# Patient Record
Sex: Male | Born: 1935 | Race: White | Hispanic: No | Marital: Married | Smoking: Former smoker
Health system: Southern US, Community
[De-identification: ages and names within clinical notes are randomized; demographics above are authoritative.]

## PROBLEM LIST (undated history)

## (undated) DIAGNOSIS — K219 Gastro-esophageal reflux disease without esophagitis: Secondary | ICD-10-CM

## (undated) DIAGNOSIS — M25511 Pain in right shoulder: Secondary | ICD-10-CM

## (undated) DIAGNOSIS — K838 Other specified diseases of biliary tract: Secondary | ICD-10-CM

## (undated) DIAGNOSIS — R05 Cough: Secondary | ICD-10-CM

## (undated) DIAGNOSIS — R634 Abnormal weight loss: Secondary | ICD-10-CM

## (undated) DIAGNOSIS — I2510 Atherosclerotic heart disease of native coronary artery without angina pectoris: Secondary | ICD-10-CM

## (undated) DIAGNOSIS — D649 Anemia, unspecified: Secondary | ICD-10-CM

## (undated) DIAGNOSIS — T466X5A Adverse effect of antihyperlipidemic and antiarteriosclerotic drugs, initial encounter: Secondary | ICD-10-CM

## (undated) DIAGNOSIS — R079 Chest pain, unspecified: Secondary | ICD-10-CM

## (undated) DIAGNOSIS — G720 Drug-induced myopathy: Secondary | ICD-10-CM

## (undated) DIAGNOSIS — E871 Hypo-osmolality and hyponatremia: Secondary | ICD-10-CM

## (undated) DIAGNOSIS — C801 Malignant (primary) neoplasm, unspecified: Secondary | ICD-10-CM

## (undated) DIAGNOSIS — R351 Nocturia: Secondary | ICD-10-CM

## (undated) DIAGNOSIS — M5431 Sciatica, right side: Secondary | ICD-10-CM

## (undated) DIAGNOSIS — N289 Disorder of kidney and ureter, unspecified: Secondary | ICD-10-CM

## (undated) DIAGNOSIS — Z8739 Personal history of other diseases of the musculoskeletal system and connective tissue: Secondary | ICD-10-CM

## (undated) DIAGNOSIS — R002 Palpitations: Secondary | ICD-10-CM

## (undated) DIAGNOSIS — R160 Hepatomegaly, not elsewhere classified: Secondary | ICD-10-CM

## (undated) DIAGNOSIS — M25512 Pain in left shoulder: Secondary | ICD-10-CM

## (undated) DIAGNOSIS — M1990 Unspecified osteoarthritis, unspecified site: Secondary | ICD-10-CM

## (undated) HISTORY — DX: Nocturia: R35.1

## (undated) HISTORY — DX: Pain in right shoulder: M25.511

## (undated) HISTORY — DX: Other disorders of bilirubin metabolism: E80.6

## (undated) HISTORY — DX: Pain in left shoulder: M25.512

## (undated) HISTORY — DX: Chest pain, unspecified: R07.9

## (undated) HISTORY — DX: Drug-induced myopathy: G72.0

## (undated) HISTORY — DX: Anemia, unspecified: D64.9

## (undated) HISTORY — DX: Disorder of kidney and ureter, unspecified: N28.9

## (undated) HISTORY — DX: Cough: R05

## (undated) HISTORY — DX: Abnormal weight loss: R63.4

## (undated) HISTORY — DX: Adverse effect of antihyperlipidemic and antiarteriosclerotic drugs, initial encounter: T46.6X5A

## (undated) HISTORY — DX: Personal history of other diseases of the musculoskeletal system and connective tissue: Z87.39

## (undated) HISTORY — DX: Unspecified osteoarthritis, unspecified site: M19.90

## (undated) HISTORY — DX: Palpitations: R00.2

## (undated) HISTORY — DX: Atherosclerotic heart disease of native coronary artery without angina pectoris: I25.10

## (undated) HISTORY — DX: Other specified diseases of biliary tract: K83.8

## (undated) HISTORY — DX: Hepatomegaly, not elsewhere classified: R16.0

## (undated) HISTORY — DX: Hypo-osmolality and hyponatremia: E87.1

## (undated) HISTORY — DX: Sciatica, right side: M54.31

---

## 1969-07-29 HISTORY — PX: OTHER SURGICAL HISTORY: SHX169

## 1977-11-28 HISTORY — PX: KNEE SURGERY: SHX244

## 1991-11-29 HISTORY — PX: PROSTATE SURGERY: SHX751

## 2001-09-14 ENCOUNTER — Ambulatory Visit (HOSPITAL_COMMUNITY): Admission: RE | Admit: 2001-09-14 | Discharge: 2001-09-14 | Payer: Self-pay | Admitting: Cardiology

## 2008-08-14 ENCOUNTER — Encounter: Admission: RE | Admit: 2008-08-14 | Discharge: 2008-08-14 | Payer: Self-pay | Admitting: Cardiology

## 2010-07-30 ENCOUNTER — Ambulatory Visit: Payer: Self-pay | Admitting: Cardiology

## 2010-08-06 ENCOUNTER — Encounter: Admission: RE | Admit: 2010-08-06 | Discharge: 2010-08-06 | Payer: Self-pay | Admitting: Cardiology

## 2010-08-06 ENCOUNTER — Ambulatory Visit: Payer: Self-pay | Admitting: Cardiology

## 2010-10-14 ENCOUNTER — Encounter (HOSPITAL_COMMUNITY)
Admission: RE | Admit: 2010-10-14 | Discharge: 2010-12-28 | Payer: Self-pay | Source: Home / Self Care | Attending: Nephrology | Admitting: Nephrology

## 2010-10-15 ENCOUNTER — Ambulatory Visit: Payer: Self-pay | Admitting: Cardiology

## 2010-12-03 LAB — POCT HEMOGLOBIN-HEMACUE: Hemoglobin: 10.2 g/dL — ABNORMAL LOW (ref 13.0–17.0)

## 2010-12-07 ENCOUNTER — Ambulatory Visit: Payer: Self-pay | Admitting: Cardiology

## 2010-12-13 LAB — IRON AND TIBC
Iron: 116 ug/dL (ref 42–135)
Saturation Ratios: 37 % (ref 20–55)
TIBC: 315 ug/dL (ref 215–435)
UIBC: 199 ug/dL

## 2010-12-13 LAB — POCT HEMOGLOBIN-HEMACUE: Hemoglobin: 11.1 g/dL — ABNORMAL LOW (ref 13.0–17.0)

## 2010-12-13 LAB — FERRITIN: Ferritin: 14 ng/mL — ABNORMAL LOW (ref 22–322)

## 2010-12-21 LAB — POCT HEMOGLOBIN-HEMACUE: Hemoglobin: 12.4 g/dL — ABNORMAL LOW (ref 13.0–17.0)

## 2010-12-31 ENCOUNTER — Other Ambulatory Visit: Payer: Self-pay | Admitting: Nephrology

## 2010-12-31 ENCOUNTER — Other Ambulatory Visit: Payer: Self-pay

## 2010-12-31 ENCOUNTER — Encounter (HOSPITAL_COMMUNITY): Payer: Medicare Other | Attending: Nephrology

## 2010-12-31 DIAGNOSIS — D638 Anemia in other chronic diseases classified elsewhere: Secondary | ICD-10-CM | POA: Insufficient documentation

## 2010-12-31 DIAGNOSIS — N183 Chronic kidney disease, stage 3 unspecified: Secondary | ICD-10-CM | POA: Insufficient documentation

## 2010-12-31 LAB — FERRITIN: Ferritin: 29 ng/mL (ref 22–322)

## 2010-12-31 LAB — IRON AND TIBC
Iron: 93 ug/dL (ref 42–135)
Saturation Ratios: 30 % (ref 20–55)
TIBC: 315 ug/dL (ref 215–435)
UIBC: 222 ug/dL

## 2011-01-03 LAB — POCT HEMOGLOBIN-HEMACUE: Hemoglobin: 11.8 g/dL — ABNORMAL LOW (ref 13.0–17.0)

## 2011-01-14 ENCOUNTER — Other Ambulatory Visit: Payer: Self-pay

## 2011-01-14 ENCOUNTER — Encounter (HOSPITAL_COMMUNITY): Payer: Medicare Other

## 2011-01-14 LAB — POCT HEMOGLOBIN-HEMACUE: Hemoglobin: 12.2 g/dL — ABNORMAL LOW (ref 13.0–17.0)

## 2011-01-28 ENCOUNTER — Encounter (HOSPITAL_COMMUNITY): Payer: 59

## 2011-02-04 ENCOUNTER — Encounter (HOSPITAL_COMMUNITY): Payer: Medicare Other | Attending: Nephrology

## 2011-02-04 ENCOUNTER — Other Ambulatory Visit: Payer: Self-pay

## 2011-02-04 ENCOUNTER — Other Ambulatory Visit: Payer: Self-pay | Admitting: Internal Medicine

## 2011-02-04 DIAGNOSIS — D638 Anemia in other chronic diseases classified elsewhere: Secondary | ICD-10-CM | POA: Insufficient documentation

## 2011-02-04 DIAGNOSIS — N183 Chronic kidney disease, stage 3 unspecified: Secondary | ICD-10-CM | POA: Insufficient documentation

## 2011-02-04 LAB — IRON AND TIBC
Iron: 77 ug/dL (ref 42–135)
Saturation Ratios: 28 % (ref 20–55)
TIBC: 271 ug/dL (ref 215–435)
UIBC: 194 ug/dL

## 2011-02-04 LAB — FERRITIN: Ferritin: 53 ng/mL (ref 22–322)

## 2011-02-04 LAB — POCT HEMOGLOBIN-HEMACUE: Hemoglobin: 10.6 g/dL — ABNORMAL LOW (ref 13.0–17.0)

## 2011-02-07 LAB — FERRITIN: Ferritin: 23 ng/mL (ref 22–322)

## 2011-02-07 LAB — POCT HEMOGLOBIN-HEMACUE
Hemoglobin: 9.6 g/dL — ABNORMAL LOW (ref 13.0–17.0)
Hemoglobin: 9.7 g/dL — ABNORMAL LOW (ref 13.0–17.0)
Hemoglobin: 9.4 g/dL — ABNORMAL LOW (ref 13.0–17.0)

## 2011-02-08 LAB — POCT HEMOGLOBIN-HEMACUE: Hemoglobin: 8.8 g/dL — ABNORMAL LOW (ref 13.0–17.0)

## 2011-02-25 ENCOUNTER — Other Ambulatory Visit: Payer: Self-pay | Admitting: Nephrology

## 2011-02-25 ENCOUNTER — Encounter (HOSPITAL_COMMUNITY): Payer: Medicare Other

## 2011-02-25 LAB — IRON AND TIBC
Iron: 90 ug/dL (ref 42–135)
Saturation Ratios: 32 % (ref 20–55)
TIBC: 282 ug/dL (ref 215–435)
UIBC: 192 ug/dL

## 2011-02-25 LAB — FERRITIN: Ferritin: 43 ng/mL (ref 22–322)

## 2011-02-28 LAB — POCT HEMOGLOBIN-HEMACUE: Hemoglobin: 10.6 g/dL — ABNORMAL LOW (ref 13.0–17.0)

## 2011-03-18 ENCOUNTER — Other Ambulatory Visit: Payer: Self-pay | Admitting: Nephrology

## 2011-03-18 ENCOUNTER — Encounter (HOSPITAL_COMMUNITY): Payer: Medicare Other | Attending: Nephrology

## 2011-03-18 DIAGNOSIS — D638 Anemia in other chronic diseases classified elsewhere: Secondary | ICD-10-CM | POA: Insufficient documentation

## 2011-03-18 DIAGNOSIS — N183 Chronic kidney disease, stage 3 unspecified: Secondary | ICD-10-CM | POA: Insufficient documentation

## 2011-03-18 LAB — POCT HEMOGLOBIN-HEMACUE: Hemoglobin: 9.8 g/dL — ABNORMAL LOW (ref 13.0–17.0)

## 2011-04-06 ENCOUNTER — Other Ambulatory Visit: Payer: Self-pay | Admitting: *Deleted

## 2011-04-06 DIAGNOSIS — E785 Hyperlipidemia, unspecified: Secondary | ICD-10-CM

## 2011-04-06 MED ORDER — ATORVASTATIN CALCIUM 10 MG PO TABS: 10.0000 mg | ORAL_TABLET | Freq: Every day | ORAL | Status: DC

## 2011-04-06 NOTE — Telephone Encounter (Signed)
Refilled lipitor re-fax [facility]

## 2011-04-06 NOTE — Telephone Encounter (Signed)
Refilled meds per fax request.  

## 2011-04-08 ENCOUNTER — Other Ambulatory Visit: Payer: Self-pay | Admitting: Nephrology

## 2011-04-08 ENCOUNTER — Encounter (HOSPITAL_COMMUNITY): Payer: Medicare Other | Attending: Nephrology

## 2011-04-08 DIAGNOSIS — D638 Anemia in other chronic diseases classified elsewhere: Secondary | ICD-10-CM | POA: Insufficient documentation

## 2011-04-08 DIAGNOSIS — N183 Chronic kidney disease, stage 3 unspecified: Secondary | ICD-10-CM | POA: Insufficient documentation

## 2011-04-08 LAB — IRON AND TIBC
Iron: 112 ug/dL (ref 42–135)
Saturation Ratios: 36 % (ref 20–55)
TIBC: 309 ug/dL (ref 215–435)
UIBC: 197 ug/dL

## 2011-04-08 LAB — FERRITIN: Ferritin: 54 ng/mL (ref 22–322)

## 2011-04-11 LAB — POCT HEMOGLOBIN-HEMACUE: Hemoglobin: 10.7 g/dL — ABNORMAL LOW (ref 13.0–17.0)

## 2011-04-15 NOTE — Discharge Summary (Signed)
[facility]  Patient:    Gerald Jacobs, Gerald Jacobs Visit Number: [id number] MRN: [id number]          Service Type: Attending:  Kerry Hough., M.D. Dictated by:   Kerry Hough., M.D. Proc. Date: 09/13/01 Adm. Date:  09/14/01   CC:         Marcello Moores A. Mare Ferrari, M.D.   Discharge Summary  INDICATIONS: The patient is a 77 year old male with diabetes, atypical chest pain and an abnormal Cardiolite scan.  COMMENTS ABOUT PROCEDURE: The patient tolerated the procedure well without complications and had good hemostasis and pedal pulses following the procedure. Please see the attached catheterization report for remainder of the details.  HEMODYNAMIC DATA: Aorta post contrast 113/50, LV post contrast 113/9.  ANGIOGRAPHIC DATA:  LEFT VENTRICULOGRAM: The left ventriculogram performed in the 30 degree RAO projection.  The aortic valve appears normal. The mitral valve is normal. The left ventricle is normal in size. There is normal wall motion. The estimated ejection fraction was 60-65%.  Coronary arteries arise and distribute normally. Scattered calcification is seen involving the proximal left coronary system. There is also calcification seen in the aortic knob on fluoroscopy.  The left main coronary artery appears normal.  The left anterior descending has mild calcification proximally and a mild eccentric 40% proximal stenosis. The distal vessel tapers and is of small caliber.  The circumflex coronary artery has a large first marginal branch and a large posterolateral branch.  The right coronary artery is a somewhat small vessel ending in a posterior descending artery and is free of disease. The posterior descending artery is somewhat small caliber.  IMPRESSION: 1. Mild coronary artery disease predominately involving the proximal left    anterior descending. 2. Normal left ventricular function. Dictated by:   Kerry Hough.,  M.D. Attending:  Kerry Hough., M.D. DD:  09/14/01 TD:  09/14/01 Job: [id number]

## 2011-04-15 NOTE — H&P (Signed)
[facility]  Patient:    Gerald Jacobs, Gerald Jacobs Visit Number: [id number] MRN: [id number]          Service Type: Attending:  Kerry Hough., M.D. Dictated by:   Kerry Hough., M.D. Adm. Date:  09/14/01   CC:         Marcello Moores A. Mare Ferrari, M.D.                         History and Physical  REASON FOR ADMISSION:  Catheterization.  HISTORY OF PRESENT ILLNESS:  The patient is a 77 year old male who has a prior history of hypertension and diabetes.  He had three episodes of chest discomfort recently which were not exertional.  He attributed them to eating peanuts on three occasions.  He had been feeling weak with no energy and was not pleuritic.  He had one episode which occurred while playing cough and was attributed to eating peanut.  It did not radiate to the left arm and was not associate with diaphoresis.  Thomas A. Mare Ferrari, M.D., did a stress Cardiolite study on him on September 03, 2001.  He only walked six minutes into the Bruce protocol.  He had some mild transient ischemic dilation and inferior ischemia noted.  Catheterization was advised.  PAST MEDICAL HISTORY:  Remarkable for labile hypertension for many years and he does take medicines for blood pressure.  He has been a diabetic for several years, which has been controlled with oral agents.  Hemoglobin A1Cs run in the high 6s.  He does not have significant hyperlipidemia.  No ulcers.  PAST SURGICAL HISTORY:  Removal of melena years ago.  Right knee surgery in 1979.  Radical prostatectomy in March of 1993 for prostate cancer.  ALLERGIES:  ACE INHIBITORS cause cough.  CURRENT MEDICATIONS: 1. Inderal 10 mg daily. 2. Aspirin 325 mg daily. 3. Toprol XL 50 mg daily. 4. Hyzaar 50/12.5 mg daily. 5. Glucophage 500 mg daily. 6. Actos 30 mg daily. 7. Clarinex daily.  FAMILY HISTORY:  His father died at age 63 of stroke.  His mother died at age 52 of stroke and heart disease.  Four  brothers are living.  A sister is living.  Two brothers died, one of cancer.  Two sisters have died.  SOCIAL HISTORY:  He is retired for [employer].  He has been married for close to 50 years.  He attends [employer].  He drinks rare alcohol socially.  He quit smoking in 1984.  REVIEW OF SYSTEMS:  He has had some migratory pruritus previous.  He wears glasses.  He has no eyes, ear, nose, or throat problems of significance.  He tends to get chokes if he lies flat.  He has some erectile dysfunction related to his previous surgery.  He has significant urinary incontinence.  He has no diabetic eye problems, no neuropathy, and no claudication.  No TIAs other than as noted above.  The remainder of his review of systems was normal.  PHYSICAL EXAMINATION:  He is a pleasant male who is in no acute distress.  WEIGHT:  154 pounds.  VITAL SIGNS:  His blood pressure is 150/70 sitting and 150/74 standing.  His pulse was 70 and regular.  SKIN:  Warm and dry.  HEENT:  EOMI.  PERRLA.  CNS clear.  Fundi were normal.  The pharynx was negative.  NECK:  Supple without masses.  No JVD, thyromegaly, or carotid bruits.  Lymph nodes were  normal.  LUNGS:  Clear to A&P.  CARDIAC:  Normal S1 and S2.  No S3, S4, or murmur.  ABDOMEN:  Soft and nontender.  There is no mass, no hepatosplenomegaly, and no aneurysm.  EXTREMITIES:  Femoral pulses were 2+ and peripheral were present and 2+. There was no peripheral edema.  NEUROLOGIC:  Normal.  GENITOURINARY:  Deferred due to cardiac condition.  RECTAL:  Deferred due to cardiac condition.  IMPRESSION: 1. Chest discomfort with atypical feature with an abnormal Cardiolite scan.    Rule out coronary artery disease. 2. Type 2 diabetes mellitus. 3. Hypertension.  Not at target control for a diabetic. 4. History of prostate cancer, status post radical prostatectomy.  RECOMMENDATIONS:  Brought in for same-day cardiac catheterization and  possible angioplasty and stent.  The procedure was discussed with the patient fully, including the risks of MI, death, or CVA.  He is agreeable and willing to proceed. Dictated by:   Kerry Hough., M.D. Attending:  Kerry Hough., M.D. DD:  09/12/01 TD:  09/12/01 Job: [id number]

## 2011-04-29 ENCOUNTER — Encounter (HOSPITAL_COMMUNITY): Payer: Medicare Other | Attending: Nephrology

## 2011-04-29 ENCOUNTER — Other Ambulatory Visit: Payer: Self-pay | Admitting: Nephrology

## 2011-04-29 DIAGNOSIS — D638 Anemia in other chronic diseases classified elsewhere: Secondary | ICD-10-CM | POA: Insufficient documentation

## 2011-04-29 DIAGNOSIS — N183 Chronic kidney disease, stage 3 unspecified: Secondary | ICD-10-CM | POA: Insufficient documentation

## 2011-04-29 LAB — RENAL FUNCTION PANEL
Albumin: 3.6 g/dL (ref 3.5–5.2)
BUN: 39 mg/dL — ABNORMAL HIGH (ref 6–23)
CO2: 24 mEq/L (ref 19–32)
Calcium: 8.5 mg/dL (ref 8.4–10.5)
Chloride: 105 mEq/L (ref 96–112)
Creatinine, Ser: 2.42 mg/dL — ABNORMAL HIGH (ref 0.4–1.5)
GFR calc Af Amer: 31 mL/min — ABNORMAL LOW (ref >60)
GFR calc non Af Amer: 26 mL/min — ABNORMAL LOW (ref >60)
Glucose, Bld: 143 mg/dL — ABNORMAL HIGH (ref 70–99)
Phosphorus: 3.3 mg/dL (ref 2.3–4.6)
Potassium: 4.6 mEq/L (ref 3.5–5.1)
Sodium: 138 mEq/L (ref 135–145)

## 2011-04-29 LAB — MAGNESIUM: Magnesium: 2.4 mg/dL (ref 1.5–2.5)

## 2011-04-29 LAB — VITAMIN D 25 HYDROXY (VIT D DEFICIENCY, FRACTURES): Vit D, 25-Hydroxy: 31 ng/mL (ref 30–89)

## 2011-05-02 LAB — POCT HEMOGLOBIN-HEMACUE: Hemoglobin: 10.6 g/dL — ABNORMAL LOW (ref 13.0–17.0)

## 2011-05-23 ENCOUNTER — Other Ambulatory Visit: Payer: Self-pay | Admitting: Nephrology

## 2011-05-23 ENCOUNTER — Encounter (HOSPITAL_COMMUNITY): Payer: Medicare Other

## 2011-05-23 LAB — FERRITIN: Ferritin: 64 ng/mL (ref 22–322)

## 2011-06-13 ENCOUNTER — Encounter (HOSPITAL_COMMUNITY)
Admission: RE | Admit: 2011-06-13 | Discharge: 2011-06-13 | Disposition: A | Discharge status: Home / Self Care | Payer: Medicare Other | Source: Ambulatory Visit | Attending: Nephrology | Admitting: Nephrology

## 2011-06-13 ENCOUNTER — Other Ambulatory Visit: Payer: Self-pay | Admitting: Nephrology

## 2011-06-13 DIAGNOSIS — D638 Anemia in other chronic diseases classified elsewhere: Secondary | ICD-10-CM | POA: Insufficient documentation

## 2011-06-13 DIAGNOSIS — N183 Chronic kidney disease, stage 3 unspecified: Secondary | ICD-10-CM | POA: Insufficient documentation

## 2011-06-13 LAB — POCT HEMOGLOBIN-HEMACUE: Hemoglobin: 10.5 g/dL — ABNORMAL LOW (ref 13.0–17.0)

## 2011-07-04 ENCOUNTER — Other Ambulatory Visit: Payer: Self-pay | Admitting: Nephrology

## 2011-07-04 ENCOUNTER — Encounter (HOSPITAL_COMMUNITY): Payer: Medicare Other | Attending: Nephrology

## 2011-07-04 DIAGNOSIS — N183 Chronic kidney disease, stage 3 unspecified: Secondary | ICD-10-CM | POA: Insufficient documentation

## 2011-07-04 DIAGNOSIS — D638 Anemia in other chronic diseases classified elsewhere: Secondary | ICD-10-CM | POA: Insufficient documentation

## 2011-07-04 LAB — IRON AND TIBC
Saturation Ratios: 34 % (ref 20–55)
TIBC: 322 ug/dL (ref 215–435)

## 2011-07-04 LAB — POCT HEMOGLOBIN-HEMACUE: Hemoglobin: 11.3 g/dL — ABNORMAL LOW (ref 13.0–17.0)

## 2011-07-04 LAB — FERRITIN: Ferritin: 48 ng/mL (ref 22–322)

## 2011-07-25 ENCOUNTER — Encounter (HOSPITAL_COMMUNITY): Payer: 59

## 2011-07-26 ENCOUNTER — Other Ambulatory Visit: Payer: Self-pay | Admitting: Nephrology

## 2011-07-26 ENCOUNTER — Encounter (HOSPITAL_COMMUNITY)
Admission: RE | Admit: 2011-07-26 | Discharge: 2011-07-26 | Payer: Medicare Other | Source: Ambulatory Visit | Attending: Nephrology | Admitting: Nephrology

## 2011-08-16 ENCOUNTER — Other Ambulatory Visit: Payer: Self-pay | Admitting: Nephrology

## 2011-08-16 ENCOUNTER — Encounter (HOSPITAL_COMMUNITY): Payer: Medicare Other | Attending: Nephrology

## 2011-08-16 DIAGNOSIS — D638 Anemia in other chronic diseases classified elsewhere: Secondary | ICD-10-CM | POA: Insufficient documentation

## 2011-08-16 DIAGNOSIS — N183 Chronic kidney disease, stage 3 unspecified: Secondary | ICD-10-CM | POA: Insufficient documentation

## 2011-08-17 LAB — POCT HEMOGLOBIN-HEMACUE: Hemoglobin: 10.6 g/dL — ABNORMAL LOW (ref 13.0–17.0)

## 2011-08-23 ENCOUNTER — Other Ambulatory Visit: Payer: Self-pay | Admitting: Cardiology

## 2011-08-23 ENCOUNTER — Other Ambulatory Visit: Payer: Self-pay | Admitting: *Deleted

## 2011-08-31 ENCOUNTER — Other Ambulatory Visit: Payer: Medicare Other | Admitting: *Deleted

## 2011-09-02 ENCOUNTER — Ambulatory Visit (INDEPENDENT_AMBULATORY_CARE_PROVIDER_SITE_OTHER): Payer: Medicare Other | Admitting: *Deleted

## 2011-09-02 DIAGNOSIS — E7800 Pure hypercholesterolemia, unspecified: Secondary | ICD-10-CM

## 2011-09-02 LAB — BASIC METABOLIC PANEL
CO2: 22 mEq/L (ref 19–32)
Chloride: 108 mEq/L (ref 96–112)
Glucose, Bld: 145 mg/dL — ABNORMAL HIGH (ref 70–99)
Potassium: 4.3 mEq/L (ref 3.5–5.1)
Sodium: 138 mEq/L (ref 135–145)

## 2011-09-02 LAB — CBC WITH DIFFERENTIAL/PLATELET
Basophils Absolute: 0 10*3/uL (ref 0.0–0.1)
Basophils Relative: 0.3 % (ref 0.0–3.0)
Eosinophils Absolute: 0.4 10*3/uL (ref 0.0–0.7)
Lymphocytes Relative: 30.3 % (ref 12.0–46.0)
MCHC: 32.7 g/dL (ref 30.0–36.0)
MCV: 93 fl (ref 78.0–100.0)
Monocytes Absolute: 0.5 10*3/uL (ref 0.1–1.0)
Neutrophils Relative %: 57 % (ref 43.0–77.0)
RBC: 3.44 Mil/uL — ABNORMAL LOW (ref 4.22–5.81)
RDW: 14.6 % (ref 11.5–14.6)

## 2011-09-02 LAB — LIPID PANEL
Cholesterol: 107 mg/dL (ref 0–200)
HDL: 43.1 mg/dL (ref >39.00)
LDL Cholesterol: 37 mg/dL (ref 0–99)
Triglycerides: 133 mg/dL (ref 0.0–149.0)
VLDL: 26.6 mg/dL (ref 0.0–40.0)

## 2011-09-02 LAB — HEPATIC FUNCTION PANEL
AST: 19 U/L (ref 0–37)
Albumin: 3.6 g/dL (ref 3.5–5.2)
Total Bilirubin: 0.5 mg/dL (ref 0.3–1.2)

## 2011-09-06 ENCOUNTER — Other Ambulatory Visit: Payer: Self-pay | Admitting: Nephrology

## 2011-09-06 ENCOUNTER — Encounter (HOSPITAL_COMMUNITY)
Admission: RE | Admit: 2011-09-06 | Discharge: 2011-09-06 | Disposition: A | Discharge status: Home / Self Care | Payer: Medicare Other | Source: Ambulatory Visit | Attending: Nephrology | Admitting: Nephrology

## 2011-09-06 DIAGNOSIS — D638 Anemia in other chronic diseases classified elsewhere: Secondary | ICD-10-CM | POA: Insufficient documentation

## 2011-09-06 DIAGNOSIS — N183 Chronic kidney disease, stage 3 unspecified: Secondary | ICD-10-CM | POA: Insufficient documentation

## 2011-09-06 LAB — POCT HEMOGLOBIN-HEMACUE: Hemoglobin: 10.8 g/dL — ABNORMAL LOW (ref 13.0–17.0)

## 2011-09-06 LAB — IRON AND TIBC
Saturation Ratios: 28 % (ref 20–55)
TIBC: 306 ug/dL (ref 215–435)
UIBC: 220 ug/dL (ref 125–400)

## 2011-09-06 LAB — FERRITIN: Ferritin: 42 ng/mL (ref 22–322)

## 2011-09-07 ENCOUNTER — Ambulatory Visit (INDEPENDENT_AMBULATORY_CARE_PROVIDER_SITE_OTHER): Payer: Medicare Other | Admitting: Cardiology

## 2011-09-07 ENCOUNTER — Encounter: Payer: Self-pay | Admitting: Cardiology

## 2011-09-07 VITALS — BP 143/68 | HR 63 | Ht 68.0 in | Wt 153.0 lb

## 2011-09-07 DIAGNOSIS — E785 Hyperlipidemia, unspecified: Secondary | ICD-10-CM

## 2011-09-07 DIAGNOSIS — E119 Type 2 diabetes mellitus without complications: Secondary | ICD-10-CM

## 2011-09-07 DIAGNOSIS — N184 Chronic kidney disease, stage 4 (severe): Secondary | ICD-10-CM | POA: Insufficient documentation

## 2011-09-07 DIAGNOSIS — E7800 Pure hypercholesterolemia, unspecified: Secondary | ICD-10-CM

## 2011-09-07 DIAGNOSIS — I119 Hypertensive heart disease without heart failure: Secondary | ICD-10-CM

## 2011-09-07 DIAGNOSIS — E1122 Type 2 diabetes mellitus with diabetic chronic kidney disease: Secondary | ICD-10-CM | POA: Insufficient documentation

## 2011-09-07 DIAGNOSIS — N183 Chronic kidney disease, stage 3 unspecified: Secondary | ICD-10-CM

## 2011-09-07 MED ORDER — MIRTAZAPINE 7.5 MG PO TABS: 7.5000 mg | ORAL_TABLET | Freq: Every day | ORAL | Status: DC

## 2011-09-07 NOTE — Assessment & Plan Note (Signed)
The patient was taken off his metformin because of his renal insufficiency by nephrology.  In its place.  He is on Januvia 50 mg daily.  Blood sugars are still running a little high.  We'll continue to watch his diet carefully and continue same meds

## 2011-09-07 NOTE — Patient Instructions (Signed)
Your physician wants you to follow-up in: 4 months. You will receive a reminder letter in the mail two months in advance. If you don't receive a letter, please call our office to schedule the follow-up appointment.  

## 2011-09-07 NOTE — Assessment & Plan Note (Signed)
The patient has not had any dizzy spells or syncope.  No headaches.  No symptoms of congestive heart failure.

## 2011-09-07 NOTE — Progress Notes (Signed)
[name] Date of Birth:  1925-09-18 [facility] [id number]   [place], [place]  [address] [phone]           Fax   [phone]  History of Present Illness: This pleasant 77 year old gentleman is seen for a scheduled followup office visit. He has a complex past medical history.  Has a history of essential hypertension, and a history of adult onset diabetes.  He has a history of known mild coronary disease.  In 2002.  He had an abnormal stress nuclear study associated with atypical chest pain, and he went on to have cardiac catheterization by Dr. Georgina Peer which showed mild coronary artery disease, predominantly involving the LAD and is left ventricular function was normal with an ejection fraction of 60-65%.  Recently he has not been expressing any chest discomfort.  He is a former smoker, having quit about 20 years ago.  He has renal insufficiency and is being followed by nephrology.  He is receiving injections of Procrit on an as needed basis for anemia of chronic disease.  He is diabetic.  He had been on metformin, which was stopped, and he is now on Januvia 50 mg daily.  He has had a poor appetite and was recently placed on Remeron 7.5 mg at bedtime with improvement in his appetite.  Current Outpatient Prescriptions  Medication Sig Dispense Refill  . allopurinol (ZYLOPRIM) 100 MG tablet TAKE 1 TABLET DAILY  90 tablet  1  . aspirin 81 MG tablet Take 81 mg by mouth daily.        Marland Kitchen atorvastatin (LIPITOR) 10 MG tablet Take 1 tablet (10 mg total) by mouth daily.  90 tablet  3  . b complex-vitamin c-folic acid (NEPHRO-VITE) 0.8 MG TABS Take 0.8 mg by mouth at bedtime.        . IRON PO Take by mouth daily.        . metoprolol (TOPROL-XL) 50 MG 24 hr tablet Take 50 mg by mouth daily.        . mirtazapine (REMERON) 7.5 MG tablet Take 1 tablet (7.5 mg total) by mouth at bedtime.  90 tablet  3  . sitaGLIPtin (JANUVIA) 50 MG tablet Take 50 mg by mouth  daily.        Marland Kitchen telmisartan-hydrochlorothiazide (MICARDIS HCT) 40-12.5 MG per tablet Take 1 tablet by mouth daily.          Allergies  Allergen Reactions  . Flexeril (Cyclobenzaprine Hcl)   . Zebeta     There is no problem list on file for this patient.   History  Smoking status  . Former Smoker -- 1.0 packs/day for 45 years  . Types: Cigarettes  . Quit date: 09/07/1991  Smokeless tobacco  . Never Used    History  Alcohol Use  . Yes    Family History  Problem Relation Age of Onset  . Stroke Father   . Stroke Mother   . Stroke Sister   . Stroke Brother   . Stroke Brother   . Stroke Brother     Review of Systems: Constitutional: no fever chills diaphoresis or fatigue or change in weight.  Head and neck: no hearing loss, no epistaxis, no photophobia or visual disturbance. Respiratory: No cough, shortness of breath or wheezing. Cardiovascular: No chest pain peripheral edema, palpitations. Gastrointestinal: No abdominal distention, no abdominal pain, no change in bowel habits hematochezia or melena. Genitourinary: No dysuria, no frequency, no urgency, no nocturia.  Musculoskeletal:No arthralgias, no back pain, no gait disturbance or myalgias. Neurological: No dizziness, no headaches, no numbness, no seizures, no syncope, no weakness, no tremors. Hematologic: No lymphadenopathy, no easy bruising. Psychiatric: No confusion, no hallucinations, no sleep disturbance.    Physical Exam: Filed Vitals:   09/07/11 1538  BP: 143/68  Pulse: 63   Gen. appearance reveals a well-developed, thin gentleman in no acute distress.Pupils equal and reactive.   Extraocular Movements are full.  There is no scleral icterus.  The mouth and pharynx are normal.  The neck is supple.  The carotids reveal no bruits.  The jugular venous pressure is normal.  The thyroid is not enlarged.  There is no lymphadenopathy.  The chest is clear to percussion and auscultation. There are no rales or  rhonchi. Expansion of the chest is symmetrical.  The precordium is quiet.  The first heart sound is normal.  The second heart sound is physiologically split.  There is no murmur gallop rub or click.  There is no abnormal lift or heave.  The abdomen is soft and nontender. Bowel sounds are normal. The liver and spleen are not enlarged. There Are no abdominal masses. There are no bruits.  The pedal pulses are good.  There is no phlebitis or edema.  There is no cyanosis or clubbing. Strength is normal and symmetrical in all extremities.  There is no lateralizing weakness.  There are no sensory deficits.                 Assessment / Plan:  Continue same medication.  He is on Remeron 7.5 mg at bedtime, which has helped his appetite.  He is on Nephrocaps for appetite encouragement.  Also, will plan to see him in 4 months for fasting lab work including a A1c and an EKG

## 2011-09-07 NOTE — Assessment & Plan Note (Signed)
The patient has a history of dyslipidemia.  He is on low-dose Lipitor 10 mg daily.  His recent lipid panel has been satisfactory.  He has not had any myalgias from the statin therapy.  His liver function studies remain normal

## 2011-09-14 ENCOUNTER — Telehealth: Payer: Self-pay | Admitting: Cardiology

## 2011-09-14 MED ORDER — COLCHICINE 0.6 MG PO TABS: 0.6000 mg | ORAL_TABLET | Freq: Two times a day (BID) | ORAL | Status: DC | PRN

## 2011-09-14 NOTE — Telephone Encounter (Signed)
Discussed with Cecille Rubin NP and will Rx Colchicine 0.6 mg twice daily as needed instead.  Advised patient

## 2011-09-14 NOTE — Telephone Encounter (Signed)
Pt woke up with gout and wants to know if can get some prednisone and he has been going to hospital taking procret shots to boost his HGB and he has not had gout for a while and he wants to make sure there is no interaction and if not [facility] [place]

## 2011-09-14 NOTE — Telephone Encounter (Signed)
Pt needs rx today if possible, needs asap

## 2011-09-16 ENCOUNTER — Telehealth: Payer: Self-pay | Admitting: *Deleted

## 2011-09-16 NOTE — Telephone Encounter (Signed)
Pt calling to speak with [facility]. Please return pt call.

## 2011-09-16 NOTE — Telephone Encounter (Signed)
10/19--pt calling stating colchicine is not working on gout--lots of diarrhea --wants prednisone--spoke to [employer] gerhardt who agrees pt may have prednisone--called pt and let him know rx called in--be sure to keep an eye on blood sugars when taking this med--pt agrees--nt

## 2011-09-16 NOTE — Telephone Encounter (Signed)
Gerald Jacobs started the Colchicine Wed pm and states it is not working yet.  It is also giving him diarrhea.  [place] and wants a prescription for prednisone.  He states the last few times he has had gout, he has gotten prednisone and it has worked well and quickly.

## 2011-09-25 NOTE — Telephone Encounter (Signed)
Agree with advice given and with trial of prednisone

## 2011-09-25 NOTE — Telephone Encounter (Signed)
Agree with results given.

## 2011-09-27 ENCOUNTER — Encounter (HOSPITAL_COMMUNITY): Payer: Medicare Other

## 2011-09-27 ENCOUNTER — Other Ambulatory Visit: Payer: Self-pay | Admitting: Nephrology

## 2011-10-17 ENCOUNTER — Other Ambulatory Visit (HOSPITAL_COMMUNITY): Payer: Self-pay | Admitting: *Deleted

## 2011-10-18 ENCOUNTER — Encounter (HOSPITAL_COMMUNITY)
Admission: RE | Admit: 2011-10-18 | Discharge: 2011-10-18 | Disposition: A | Discharge status: Home / Self Care | Payer: Medicare Other | Source: Ambulatory Visit | Attending: Nephrology | Admitting: Nephrology

## 2011-10-18 DIAGNOSIS — D638 Anemia in other chronic diseases classified elsewhere: Secondary | ICD-10-CM | POA: Insufficient documentation

## 2011-10-18 DIAGNOSIS — N183 Chronic kidney disease, stage 3 unspecified: Secondary | ICD-10-CM | POA: Insufficient documentation

## 2011-10-18 LAB — IRON AND TIBC
Saturation Ratios: 32 % (ref 20–55)
TIBC: 310 ug/dL (ref 215–435)

## 2011-10-18 MED ORDER — EPOETIN ALFA 20000 UNIT/ML IJ SOLN
INTRAMUSCULAR | Status: AC
  Administered 2011-10-18: 20000 [IU] via SUBCUTANEOUS
  Filled 2011-10-18: qty 1

## 2011-10-18 MED ORDER — EPOETIN ALFA 10000 UNIT/ML IJ SOLN
20000.0000 [IU] | INTRAMUSCULAR | Status: DC
Start: 2011-10-18 — End: 2011-10-19

## 2011-11-07 ENCOUNTER — Other Ambulatory Visit (HOSPITAL_COMMUNITY): Payer: Self-pay | Admitting: *Deleted

## 2011-11-08 ENCOUNTER — Encounter (HOSPITAL_COMMUNITY)
Admission: RE | Admit: 2011-11-08 | Discharge: 2011-11-08 | Disposition: A | Discharge status: Home / Self Care | Payer: Medicare Other | Source: Ambulatory Visit | Attending: Nephrology | Admitting: Nephrology

## 2011-11-08 DIAGNOSIS — D638 Anemia in other chronic diseases classified elsewhere: Secondary | ICD-10-CM | POA: Insufficient documentation

## 2011-11-08 DIAGNOSIS — N183 Chronic kidney disease, stage 3 unspecified: Secondary | ICD-10-CM | POA: Insufficient documentation

## 2011-11-08 LAB — POCT HEMOGLOBIN-HEMACUE: Hemoglobin: 10.2 g/dL — ABNORMAL LOW (ref 13.0–17.0)

## 2011-11-08 MED ORDER — EPOETIN ALFA 20000 UNIT/ML IJ SOLN
INTRAMUSCULAR | Status: AC
  Administered 2011-11-08: 20000 [IU] via SUBCUTANEOUS
  Filled 2011-11-08: qty 1

## 2011-11-08 MED ORDER — EPOETIN ALFA 10000 UNIT/ML IJ SOLN: 20000.0000 [IU] | INTRAMUSCULAR | Status: DC

## 2011-11-09 ENCOUNTER — Encounter (HOSPITAL_COMMUNITY): Payer: Medicare Other

## 2011-11-16 ENCOUNTER — Other Ambulatory Visit: Payer: Self-pay | Admitting: Cardiology

## 2011-11-30 ENCOUNTER — Encounter (HOSPITAL_COMMUNITY)
Admission: RE | Admit: 2011-11-30 | Discharge: 2011-11-30 | Disposition: A | Discharge status: Home / Self Care | Payer: Medicare Other | Source: Ambulatory Visit | Attending: Nephrology | Admitting: Nephrology

## 2011-11-30 DIAGNOSIS — D638 Anemia in other chronic diseases classified elsewhere: Secondary | ICD-10-CM | POA: Insufficient documentation

## 2011-11-30 DIAGNOSIS — N183 Chronic kidney disease, stage 3 unspecified: Secondary | ICD-10-CM | POA: Insufficient documentation

## 2011-11-30 LAB — RENAL FUNCTION PANEL
BUN: 43 mg/dL — ABNORMAL HIGH (ref 6–23)
CO2: 23 mEq/L (ref 19–32)
GFR calc Af Amer: 25 mL/min — ABNORMAL LOW (ref >90)
Glucose, Bld: 176 mg/dL — ABNORMAL HIGH (ref 70–99)
Potassium: 4.5 mEq/L (ref 3.5–5.1)
Sodium: 139 mEq/L (ref 135–145)

## 2011-11-30 LAB — IRON AND TIBC
Iron: 90 ug/dL (ref 42–135)
TIBC: 328 ug/dL (ref 215–435)
UIBC: 238 ug/dL (ref 125–400)

## 2011-11-30 LAB — MAGNESIUM: Magnesium: 2.1 mg/dL (ref 1.5–2.5)

## 2011-11-30 MED ORDER — EPOETIN ALFA 20000 UNIT/ML IJ SOLN
INTRAMUSCULAR | Status: AC
  Administered 2011-11-30: 20000 [IU] via SUBCUTANEOUS
  Filled 2011-11-30: qty 1

## 2011-11-30 MED ORDER — EPOETIN ALFA 10000 UNIT/ML IJ SOLN: 20000.0000 [IU] | INTRAMUSCULAR | Status: DC

## 2011-12-02 DIAGNOSIS — H2510 Age-related nuclear cataract, unspecified eye: Secondary | ICD-10-CM | POA: Diagnosis not present

## 2011-12-09 DIAGNOSIS — N2581 Secondary hyperparathyroidism of renal origin: Secondary | ICD-10-CM | POA: Diagnosis not present

## 2011-12-09 DIAGNOSIS — N184 Chronic kidney disease, stage 4 (severe): Secondary | ICD-10-CM | POA: Diagnosis not present

## 2011-12-09 DIAGNOSIS — I129 Hypertensive chronic kidney disease with stage 1 through stage 4 chronic kidney disease, or unspecified chronic kidney disease: Secondary | ICD-10-CM | POA: Diagnosis not present

## 2011-12-09 DIAGNOSIS — D649 Anemia, unspecified: Secondary | ICD-10-CM | POA: Diagnosis not present

## 2011-12-12 DIAGNOSIS — H2510 Age-related nuclear cataract, unspecified eye: Secondary | ICD-10-CM | POA: Diagnosis not present

## 2011-12-12 DIAGNOSIS — H5700 Unspecified anomaly of pupillary function: Secondary | ICD-10-CM | POA: Diagnosis not present

## 2011-12-12 DIAGNOSIS — H269 Unspecified cataract: Secondary | ICD-10-CM | POA: Diagnosis not present

## 2011-12-12 DIAGNOSIS — IMO0002 Reserved for concepts with insufficient information to code with codable children: Secondary | ICD-10-CM | POA: Diagnosis not present

## 2011-12-16 ENCOUNTER — Other Ambulatory Visit (HOSPITAL_COMMUNITY): Payer: Self-pay | Admitting: *Deleted

## 2011-12-19 DIAGNOSIS — H2510 Age-related nuclear cataract, unspecified eye: Secondary | ICD-10-CM | POA: Diagnosis not present

## 2011-12-21 ENCOUNTER — Encounter (HOSPITAL_COMMUNITY)
Admission: RE | Admit: 2011-12-21 | Discharge: 2011-12-21 | Disposition: A | Discharge status: Home / Self Care | Payer: Medicare Other | Source: Ambulatory Visit | Attending: Nephrology | Admitting: Nephrology

## 2011-12-21 DIAGNOSIS — D638 Anemia in other chronic diseases classified elsewhere: Secondary | ICD-10-CM | POA: Diagnosis not present

## 2011-12-21 LAB — BASIC METABOLIC PANEL
CO2: 22 mEq/L (ref 19–32)
Calcium: 8.8 mg/dL (ref 8.4–10.5)
Creatinine, Ser: 2.27 mg/dL — ABNORMAL HIGH (ref 0.50–1.35)
Glucose, Bld: 204 mg/dL — ABNORMAL HIGH (ref 70–99)

## 2011-12-21 MED ORDER — EPOETIN ALFA 10000 UNIT/ML IJ SOLN: 20000.0000 [IU] | INTRAMUSCULAR | Status: DC

## 2011-12-21 MED ORDER — EPOETIN ALFA 20000 UNIT/ML IJ SOLN
INTRAMUSCULAR | Status: AC
  Administered 2011-12-21: 20000 [IU] via SUBCUTANEOUS
  Filled 2011-12-21: qty 1

## 2012-01-02 DIAGNOSIS — H269 Unspecified cataract: Secondary | ICD-10-CM | POA: Diagnosis not present

## 2012-01-02 DIAGNOSIS — IMO0002 Reserved for concepts with insufficient information to code with codable children: Secondary | ICD-10-CM | POA: Diagnosis not present

## 2012-01-02 DIAGNOSIS — H53459 Other localized visual field defect, unspecified eye: Secondary | ICD-10-CM | POA: Diagnosis not present

## 2012-01-02 DIAGNOSIS — H2510 Age-related nuclear cataract, unspecified eye: Secondary | ICD-10-CM | POA: Diagnosis not present

## 2012-01-10 ENCOUNTER — Encounter (HOSPITAL_COMMUNITY)
Admission: RE | Admit: 2012-01-10 | Discharge: 2012-01-10 | Disposition: A | Discharge status: Home / Self Care | Payer: Medicare Other | Source: Ambulatory Visit | Attending: Nephrology | Admitting: Nephrology

## 2012-01-10 DIAGNOSIS — N183 Chronic kidney disease, stage 3 unspecified: Secondary | ICD-10-CM | POA: Diagnosis not present

## 2012-01-10 DIAGNOSIS — D638 Anemia in other chronic diseases classified elsewhere: Secondary | ICD-10-CM | POA: Insufficient documentation

## 2012-01-10 LAB — IRON AND TIBC
Iron: 108 ug/dL (ref 42–135)
Saturation Ratios: 36 % (ref 20–55)
UIBC: 188 ug/dL (ref 125–400)

## 2012-01-10 LAB — FERRITIN: Ferritin: 60 ng/mL (ref 22–322)

## 2012-01-10 MED ORDER — EPOETIN ALFA 20000 UNIT/ML IJ SOLN
INTRAMUSCULAR | Status: AC
  Administered 2012-01-10: 20000 [IU] via SUBCUTANEOUS
  Filled 2012-01-10: qty 1

## 2012-01-10 MED ORDER — EPOETIN ALFA 10000 UNIT/ML IJ SOLN: 20000.0000 [IU] | INTRAMUSCULAR | Status: DC

## 2012-01-11 ENCOUNTER — Other Ambulatory Visit (INDEPENDENT_AMBULATORY_CARE_PROVIDER_SITE_OTHER): Payer: Medicare Other | Admitting: *Deleted

## 2012-01-11 ENCOUNTER — Encounter (HOSPITAL_COMMUNITY): Payer: Self-pay

## 2012-01-11 DIAGNOSIS — I119 Hypertensive heart disease without heart failure: Secondary | ICD-10-CM

## 2012-01-11 DIAGNOSIS — E7800 Pure hypercholesterolemia, unspecified: Secondary | ICD-10-CM | POA: Diagnosis not present

## 2012-01-11 DIAGNOSIS — E119 Type 2 diabetes mellitus without complications: Secondary | ICD-10-CM

## 2012-01-11 LAB — BASIC METABOLIC PANEL
BUN: 44 mg/dL — ABNORMAL HIGH (ref 6–23)
Calcium: 8.9 mg/dL (ref 8.4–10.5)
Creatinine, Ser: 2.5 mg/dL — ABNORMAL HIGH (ref 0.4–1.5)
GFR: 26.03 mL/min — ABNORMAL LOW (ref >60.00)
Glucose, Bld: 163 mg/dL — ABNORMAL HIGH (ref 70–99)
Sodium: 140 mEq/L (ref 135–145)

## 2012-01-11 LAB — HEPATIC FUNCTION PANEL
ALT: 34 U/L (ref 0–53)
AST: 23 U/L (ref 0–37)
Alkaline Phosphatase: 112 U/L (ref 39–117)
Total Bilirubin: 0.5 mg/dL (ref 0.3–1.2)

## 2012-01-11 LAB — CBC WITH DIFFERENTIAL/PLATELET
Basophils Absolute: 0 10*3/uL (ref 0.0–0.1)
Eosinophils Absolute: 0.4 10*3/uL (ref 0.0–0.7)
Lymphocytes Relative: 26.8 % (ref 12.0–46.0)
MCHC: 33.4 g/dL (ref 30.0–36.0)
MCV: 92 fl (ref 78.0–100.0)
Monocytes Absolute: 0.5 10*3/uL (ref 0.1–1.0)
Neutrophils Relative %: 61.2 % (ref 43.0–77.0)
Platelets: 146 10*3/uL — ABNORMAL LOW (ref 150.0–400.0)
RBC: 3.6 Mil/uL — ABNORMAL LOW (ref 4.22–5.81)
RDW: 15 % — ABNORMAL HIGH (ref 11.5–14.6)

## 2012-01-11 LAB — POCT HEMOGLOBIN-HEMACUE: Hemoglobin: 10.7 g/dL — ABNORMAL LOW (ref 13.0–17.0)

## 2012-01-11 LAB — LIPID PANEL
HDL: 41.9 mg/dL (ref >39.00)
Triglycerides: 128 mg/dL (ref 0.0–149.0)

## 2012-01-11 NOTE — Progress Notes (Signed)
Quick Note:  Please make copy of labs for patient visit. ______ 

## 2012-01-18 ENCOUNTER — Encounter: Payer: Self-pay | Admitting: Cardiology

## 2012-01-18 ENCOUNTER — Ambulatory Visit (INDEPENDENT_AMBULATORY_CARE_PROVIDER_SITE_OTHER): Payer: Medicare Other | Admitting: Cardiology

## 2012-01-18 VITALS — BP 128/68 | HR 78 | Ht 68.0 in | Wt 155.0 lb

## 2012-01-18 DIAGNOSIS — E119 Type 2 diabetes mellitus without complications: Secondary | ICD-10-CM

## 2012-01-18 DIAGNOSIS — I119 Hypertensive heart disease without heart failure: Secondary | ICD-10-CM | POA: Diagnosis not present

## 2012-01-18 DIAGNOSIS — E785 Hyperlipidemia, unspecified: Secondary | ICD-10-CM | POA: Diagnosis not present

## 2012-01-18 NOTE — Patient Instructions (Signed)
Your physician recommends that you continue on your current medications as directed. Please refer to the Current Medication list given to you today. Your physician recommends that you schedule a follow-up appointment in: 4 months with fasting labs (lp/bmet/hfp) and EKG

## 2012-01-18 NOTE — Assessment & Plan Note (Signed)
The patient is not expressing any signs of congestive heart failure.  No angina pectoris.  No palpitations.  No dizziness or syncope.

## 2012-01-18 NOTE — Assessment & Plan Note (Signed)
The patient is no longer on metformin because of his chronic kidney disease.  He does not feel that his Januvia is keeping his blood sugar down as well as the metformin.

## 2012-01-18 NOTE — Progress Notes (Signed)
Gerald Jacobs Date of Birth:  [birth year] [facility] [address] [place], [place]  [address] [phone]         Fax   [phone]  History of Present Illness: This pleasant 78 year old gentleman is seen for a scheduled followup office visit.  Has a history of essential hypertension and history of adult-onset diabetes as well as known mild coronary artery disease.  He has stage III chronic kidney disease and is followed by Dr. Posey Pronto.  He has anemia of chronic disease and receives injections of Procrit every 3 weeks if his hemoglobin drops.  Since last visit he has been doing reasonably well.  He has not been expressing any attacks of gout any chest pain he has only occasional exertional dyspnea.  He has not been going to the [facility] for exercise on a regular basis because he had recent cataract surgery by Dr. Bing Plume  Current Outpatient Prescriptions  Medication Sig Dispense Refill  . allopurinol (ZYLOPRIM) 100 MG tablet TAKE 1 TABLET DAILY  90 tablet  1  . aspirin 81 MG tablet Take 81 mg by mouth daily.        Marland Kitchen atorvastatin (LIPITOR) 10 MG tablet Take 1 tablet (10 mg total) by mouth daily.  90 tablet  3  . b complex-vitamin c-folic acid (NEPHRO-VITE) 0.8 MG TABS Take 0.8 mg by mouth at bedtime.        . IRON PO Take by mouth daily.        Marland Kitchen JANUVIA 50 MG tablet TAKE 1 TABLET DAILY        (PATIENT IS DISCONTINUING  METFORMIN)  90 tablet  3  . metoprolol (TOPROL-XL) 50 MG 24 hr tablet TAKE 1 TABLET DAILY  90 tablet  3  . mirtazapine (REMERON) 7.5 MG tablet Take 1 tablet (7.5 mg total) by mouth at bedtime.  90 tablet  3  . telmisartan-hydrochlorothiazide (MICARDIS HCT) 40-12.5 MG per tablet Take 1 tablet by mouth daily.          Allergies  Allergen Reactions  . Colchicine     GI problems  . Flexeril (Cyclobenzaprine Hcl)   . Zebeta     Patient Active Problem List  Diagnoses  . Benign hypertensive heart disease without heart failure  . Diabetes mellitus  .  Dyslipidemia  . Chronic renal insufficiency, stage III (moderate)    History  Smoking status  . Former Smoker -- 1.0 packs/day for 45 years  . Types: Cigarettes  . Quit date: 09/07/1991  Smokeless tobacco  . Never Used    History  Alcohol Use  . Yes    Family History  Problem Relation Age of Onset  . Stroke Father   . Stroke Mother   . Stroke Sister   . Stroke Brother   . Stroke Brother   . Stroke Brother     Review of Systems: Constitutional: no fever chills diaphoresis or fatigue or change in weight.  Head and neck: no hearing loss, no epistaxis, no photophobia or visual disturbance. Respiratory: No cough, shortness of breath or wheezing. Cardiovascular: No chest pain peripheral edema, palpitations. Gastrointestinal: No abdominal distention, no abdominal pain, no change in bowel habits hematochezia or melena. Genitourinary: No dysuria, no frequency, no urgency, no nocturia. Musculoskeletal:No arthralgias, no back pain, no gait disturbance or myalgias. Neurological: No dizziness, no headaches, no numbness, no seizures, no syncope, no weakness, no tremors. Hematologic: No lymphadenopathy, no easy bruising. Psychiatric: No confusion, no hallucinations, no sleep disturbance.  Physical Exam: Filed Vitals:   01/18/12 1015  BP: 128/68  Pulse: 78   the general appearance reveals a well-developed well-nourished elderly gentleman in no distress.Pupils equal and reactive.   Extraocular Movements are full.  There is no scleral icterus.  The mouth and pharynx are normal.  The neck is supple.  The carotids reveal no bruits.  The jugular venous pressure is normal.  The thyroid is not enlarged.  There is no lymphadenopathy.  The chest is clear to percussion and auscultation. There are no rales or rhonchi. Expansion of the chest is symmetrical.  Heart reveals a soft grade 1/6 systolic ejection murmur at the base.The abdomen is soft and nontender. Bowel sounds are normal. The  liver and spleen are not enlarged. There Are no abdominal masses. There are no bruits.  All extremities show no phlebitis or edema.Strength is normal and symmetrical in all extremities.  There is no lateralizing weakness.  There are no sensory deficits.  The skin is warm and dry.  There is no rash.    Assessment / Plan: The patient is to continue same medication.  He'll return in 4 months for followup office visit and EKG and fasting lipid panel hepatic function panel and basal metabolic panel

## 2012-01-18 NOTE — Assessment & Plan Note (Signed)
The patient is on Lipitor for his dyslipidemia.  He is not having any side effects from the Lipitor.

## 2012-01-23 DIAGNOSIS — L570 Actinic keratosis: Secondary | ICD-10-CM | POA: Diagnosis not present

## 2012-01-23 DIAGNOSIS — L821 Other seborrheic keratosis: Secondary | ICD-10-CM | POA: Diagnosis not present

## 2012-01-23 DIAGNOSIS — L578 Other skin changes due to chronic exposure to nonionizing radiation: Secondary | ICD-10-CM | POA: Diagnosis not present

## 2012-01-23 DIAGNOSIS — D1801 Hemangioma of skin and subcutaneous tissue: Secondary | ICD-10-CM | POA: Diagnosis not present

## 2012-01-23 DIAGNOSIS — D239 Other benign neoplasm of skin, unspecified: Secondary | ICD-10-CM | POA: Diagnosis not present

## 2012-01-30 ENCOUNTER — Other Ambulatory Visit (HOSPITAL_COMMUNITY): Payer: Self-pay | Admitting: *Deleted

## 2012-01-31 ENCOUNTER — Encounter (HOSPITAL_COMMUNITY)
Admission: RE | Admit: 2012-01-31 | Discharge: 2012-01-31 | Disposition: A | Discharge status: Home / Self Care | Payer: Medicare Other | Source: Ambulatory Visit | Attending: Nephrology | Admitting: Nephrology

## 2012-01-31 DIAGNOSIS — N183 Chronic kidney disease, stage 3 unspecified: Secondary | ICD-10-CM | POA: Insufficient documentation

## 2012-01-31 DIAGNOSIS — D638 Anemia in other chronic diseases classified elsewhere: Secondary | ICD-10-CM | POA: Diagnosis not present

## 2012-01-31 LAB — POCT HEMOGLOBIN-HEMACUE: Hemoglobin: 10 g/dL — ABNORMAL LOW (ref 13.0–17.0)

## 2012-01-31 MED ORDER — EPOETIN ALFA 20000 UNIT/ML IJ SOLN
INTRAMUSCULAR | Status: AC
  Administered 2012-01-31: 20000 [IU] via SUBCUTANEOUS
  Filled 2012-01-31: qty 1

## 2012-01-31 MED ORDER — EPOETIN ALFA 10000 UNIT/ML IJ SOLN: 20000.0000 [IU] | INTRAMUSCULAR | Status: DC

## 2012-02-21 ENCOUNTER — Encounter (HOSPITAL_COMMUNITY): Payer: Medicare Other

## 2012-02-22 ENCOUNTER — Encounter (HOSPITAL_COMMUNITY)
Admission: RE | Admit: 2012-02-22 | Discharge: 2012-02-22 | Disposition: A | Discharge status: Home / Self Care | Payer: Medicare Other | Source: Ambulatory Visit | Attending: Nephrology | Admitting: Nephrology

## 2012-02-22 DIAGNOSIS — D638 Anemia in other chronic diseases classified elsewhere: Secondary | ICD-10-CM | POA: Diagnosis not present

## 2012-02-22 LAB — IRON AND TIBC
Iron: 87 ug/dL (ref 42–135)
Saturation Ratios: 29 % (ref 20–55)
TIBC: 299 ug/dL (ref 215–435)
UIBC: 212 ug/dL (ref 125–400)

## 2012-02-22 LAB — POCT HEMOGLOBIN-HEMACUE: Hemoglobin: 10.6 g/dL — ABNORMAL LOW (ref 13.0–17.0)

## 2012-02-22 LAB — FERRITIN: Ferritin: 52 ng/mL (ref 22–322)

## 2012-02-22 MED ORDER — EPOETIN ALFA 10000 UNIT/ML IJ SOLN: 20000.0000 [IU] | INTRAMUSCULAR | Status: DC

## 2012-02-22 MED ORDER — EPOETIN ALFA 20000 UNIT/ML IJ SOLN
INTRAMUSCULAR | Status: AC
  Administered 2012-02-22: 20000 [IU] via SUBCUTANEOUS
  Filled 2012-02-22: qty 1

## 2012-03-12 ENCOUNTER — Other Ambulatory Visit (HOSPITAL_COMMUNITY): Payer: Self-pay | Admitting: *Deleted

## 2012-03-14 ENCOUNTER — Encounter (HOSPITAL_COMMUNITY)
Admission: RE | Admit: 2012-03-14 | Discharge: 2012-03-14 | Disposition: A | Discharge status: Home / Self Care | Payer: Medicare Other | Source: Ambulatory Visit | Attending: Nephrology | Admitting: Nephrology

## 2012-03-14 DIAGNOSIS — D638 Anemia in other chronic diseases classified elsewhere: Secondary | ICD-10-CM | POA: Insufficient documentation

## 2012-03-14 DIAGNOSIS — N183 Chronic kidney disease, stage 3 unspecified: Secondary | ICD-10-CM | POA: Diagnosis not present

## 2012-03-14 LAB — RENAL FUNCTION PANEL
BUN: 40 mg/dL — ABNORMAL HIGH (ref 6–23)
CO2: 19 mEq/L (ref 19–32)
Chloride: 106 mEq/L (ref 96–112)
GFR calc Af Amer: 31 mL/min — ABNORMAL LOW (ref >90)
Glucose, Bld: 226 mg/dL — ABNORMAL HIGH (ref 70–99)
Potassium: 4.5 mEq/L (ref 3.5–5.1)
Sodium: 137 mEq/L (ref 135–145)

## 2012-03-14 LAB — POCT HEMOGLOBIN-HEMACUE: Hemoglobin: 10.3 g/dL — ABNORMAL LOW (ref 13.0–17.0)

## 2012-03-14 LAB — MAGNESIUM: Magnesium: 1.9 mg/dL (ref 1.5–2.5)

## 2012-03-14 MED ORDER — EPOETIN ALFA 10000 UNIT/ML IJ SOLN: 20000.0000 [IU] | INTRAMUSCULAR | Status: DC

## 2012-03-14 MED ORDER — EPOETIN ALFA 20000 UNIT/ML IJ SOLN
INTRAMUSCULAR | Status: AC
  Administered 2012-03-14: 20000 [IU] via SUBCUTANEOUS
  Filled 2012-03-14: qty 1

## 2012-03-15 LAB — PTH, INTACT AND CALCIUM: PTH: 38.8 pg/mL (ref 14.0–72.0)

## 2012-04-04 ENCOUNTER — Encounter (HOSPITAL_COMMUNITY)
Admission: RE | Admit: 2012-04-04 | Discharge: 2012-04-04 | Disposition: A | Discharge status: Home / Self Care | Payer: Medicare Other | Source: Ambulatory Visit | Attending: Nephrology | Admitting: Nephrology

## 2012-04-04 DIAGNOSIS — N183 Chronic kidney disease, stage 3 unspecified: Secondary | ICD-10-CM | POA: Diagnosis not present

## 2012-04-04 DIAGNOSIS — D638 Anemia in other chronic diseases classified elsewhere: Secondary | ICD-10-CM | POA: Diagnosis not present

## 2012-04-04 LAB — POCT HEMOGLOBIN-HEMACUE: Hemoglobin: 10.4 g/dL — ABNORMAL LOW (ref 13.0–17.0)

## 2012-04-04 MED ORDER — EPOETIN ALFA 20000 UNIT/ML IJ SOLN
INTRAMUSCULAR | Status: AC
  Administered 2012-04-04: 20000 [IU] via SUBCUTANEOUS
  Filled 2012-04-04: qty 1

## 2012-04-04 MED ORDER — EPOETIN ALFA 10000 UNIT/ML IJ SOLN: 20000.0000 [IU] | INTRAMUSCULAR | Status: DC

## 2012-04-11 DIAGNOSIS — N184 Chronic kidney disease, stage 4 (severe): Secondary | ICD-10-CM | POA: Diagnosis not present

## 2012-04-11 DIAGNOSIS — I129 Hypertensive chronic kidney disease with stage 1 through stage 4 chronic kidney disease, or unspecified chronic kidney disease: Secondary | ICD-10-CM | POA: Diagnosis not present

## 2012-04-11 DIAGNOSIS — D649 Anemia, unspecified: Secondary | ICD-10-CM | POA: Diagnosis not present

## 2012-04-11 DIAGNOSIS — N2581 Secondary hyperparathyroidism of renal origin: Secondary | ICD-10-CM | POA: Diagnosis not present

## 2012-04-24 ENCOUNTER — Other Ambulatory Visit (HOSPITAL_COMMUNITY): Payer: Self-pay | Admitting: *Deleted

## 2012-04-25 ENCOUNTER — Encounter (HOSPITAL_COMMUNITY): Payer: Medicare Other

## 2012-04-27 ENCOUNTER — Encounter (HOSPITAL_COMMUNITY)
Admission: RE | Admit: 2012-04-27 | Discharge: 2012-04-27 | Disposition: A | Discharge status: Home / Self Care | Payer: Medicare Other | Source: Ambulatory Visit | Attending: Nephrology | Admitting: Nephrology

## 2012-04-27 LAB — RENAL FUNCTION PANEL
Albumin: 3.5 g/dL (ref 3.5–5.2)
BUN: 43 mg/dL — ABNORMAL HIGH (ref 6–23)
Calcium: 9.1 mg/dL (ref 8.4–10.5)
Phosphorus: 3.3 mg/dL (ref 2.3–4.6)
Potassium: 5 mEq/L (ref 3.5–5.1)
Sodium: 136 mEq/L (ref 135–145)

## 2012-04-27 LAB — IRON AND TIBC
Saturation Ratios: 30 % (ref 20–55)
TIBC: 281 ug/dL (ref 215–435)
UIBC: 196 ug/dL (ref 125–400)

## 2012-04-27 LAB — POCT HEMOGLOBIN-HEMACUE: Hemoglobin: 10.1 g/dL — ABNORMAL LOW (ref 13.0–17.0)

## 2012-04-27 MED ORDER — EPOETIN ALFA 20000 UNIT/ML IJ SOLN
INTRAMUSCULAR | Status: AC
  Administered 2012-04-27: 20000 [IU] via SUBCUTANEOUS
  Filled 2012-04-27: qty 1

## 2012-04-27 MED ORDER — EPOETIN ALFA 10000 UNIT/ML IJ SOLN: 20000.0000 [IU] | INTRAMUSCULAR | Status: DC

## 2012-05-07 ENCOUNTER — Other Ambulatory Visit (INDEPENDENT_AMBULATORY_CARE_PROVIDER_SITE_OTHER): Payer: Medicare Other

## 2012-05-07 DIAGNOSIS — E119 Type 2 diabetes mellitus without complications: Secondary | ICD-10-CM

## 2012-05-07 DIAGNOSIS — I119 Hypertensive heart disease without heart failure: Secondary | ICD-10-CM

## 2012-05-07 DIAGNOSIS — N183 Chronic kidney disease, stage 3 unspecified: Secondary | ICD-10-CM

## 2012-05-07 LAB — BASIC METABOLIC PANEL
BUN: 37 mg/dL — ABNORMAL HIGH (ref 6–23)
CO2: 23 mEq/L (ref 19–32)
Calcium: 8.5 mg/dL (ref 8.4–10.5)
Creatinine, Ser: 2.3 mg/dL — ABNORMAL HIGH (ref 0.4–1.5)

## 2012-05-07 LAB — LIPID PANEL
Cholesterol: 109 mg/dL (ref 0–200)
HDL: 42.3 mg/dL (ref >39.00)
Total CHOL/HDL Ratio: 3
Triglycerides: 182 mg/dL — ABNORMAL HIGH (ref 0.0–149.0)

## 2012-05-07 LAB — HEPATIC FUNCTION PANEL
ALT: 25 U/L (ref 0–53)
Albumin: 3.8 g/dL (ref 3.5–5.2)
Bilirubin, Direct: 0.1 mg/dL (ref 0.0–0.3)
Total Protein: 7.3 g/dL (ref 6.0–8.3)

## 2012-05-07 NOTE — Progress Notes (Signed)
Quick Note:  Please make copy of labs for patient visit. ______ 

## 2012-05-08 ENCOUNTER — Other Ambulatory Visit: Payer: Medicare Other

## 2012-05-14 DIAGNOSIS — H26499 Other secondary cataract, unspecified eye: Secondary | ICD-10-CM | POA: Diagnosis not present

## 2012-05-15 ENCOUNTER — Encounter: Payer: Self-pay | Admitting: Cardiology

## 2012-05-15 ENCOUNTER — Ambulatory Visit (INDEPENDENT_AMBULATORY_CARE_PROVIDER_SITE_OTHER): Payer: Medicare Other | Admitting: Cardiology

## 2012-05-15 VITALS — BP 139/68 | HR 68 | Ht 68.0 in | Wt 154.0 lb

## 2012-05-15 DIAGNOSIS — I119 Hypertensive heart disease without heart failure: Secondary | ICD-10-CM

## 2012-05-15 DIAGNOSIS — E785 Hyperlipidemia, unspecified: Secondary | ICD-10-CM

## 2012-05-15 DIAGNOSIS — E7800 Pure hypercholesterolemia, unspecified: Secondary | ICD-10-CM

## 2012-05-15 NOTE — Assessment & Plan Note (Signed)
The patient remains on atorvastatin 10 mg daily for his hypercholesterolemia.  He is not having any myalgias from the statin therapy.

## 2012-05-15 NOTE — Assessment & Plan Note (Signed)
Blood pressure has been remaining stable on current therapy.  He is not having any dizziness or syncope.  No headaches.  He has had occasional brief pain in his left ear of uncertain etiology.  I looked in his ear today and his tympanic membrane appears normal. If the symptoms worsen or persist he will see ENT.

## 2012-05-15 NOTE — Patient Instructions (Addendum)
Your physician recommends that you schedule a follow-up appointment in: 4 months with Dr. Mare Ferrari.  Your physician recommends that you return for fasting lab work in: 4 months - lipid, liver, BMET.

## 2012-05-15 NOTE — Assessment & Plan Note (Signed)
The patient has not been expressing any hypoglycemic episodes.  He is adhering to a prudent diet for the most part.  His recent labs are satisfactory except as triglycerides were higher at 182.  His blood sugar was 152.  Work harder on careful diet and continue same diabetic meds

## 2012-05-15 NOTE — Progress Notes (Signed)
Gerald Jacobs Date of Birth:  04-Jun-1925 [facility] [address] [place], [place]  [address] [phone]         Fax   [phone]  History of Present Illness: This pleasant 78 year old gentleman is seen for a scheduled followup office visit.  He has a past history of essential hypertension.  He also has a history of dyslipidemia, diabetes, and mild renal insufficiency.  Also has a history of hyperuricemia.  He has anemia secondary to chronic renal disease and receives ejections of Procrit about every 3 weeks depending on his hemoglobin level.  Current Outpatient Prescriptions  Medication Sig Dispense Refill  . allopurinol (ZYLOPRIM) 100 MG tablet TAKE 1 TABLET DAILY  90 tablet  1  . aspirin 81 MG tablet Take 81 mg by mouth daily.        Marland Kitchen atorvastatin (LIPITOR) 10 MG tablet Take 10 mg by mouth daily.      . IRON PO Take 27 mg by mouth daily.       Marland Kitchen JANUVIA 50 MG tablet TAKE 1 TABLET DAILY        (PATIENT IS DISCONTINUING  METFORMIN)  90 tablet  3  . metoprolol (TOPROL-XL) 50 MG 24 hr tablet TAKE 1 TABLET DAILY  90 tablet  3  . mirtazapine (REMERON) 7.5 MG tablet Take 1 tablet (7.5 mg total) by mouth at bedtime.  90 tablet  3  . telmisartan-hydrochlorothiazide (MICARDIS HCT) 40-12.5 MG per tablet Take 1 tablet by mouth daily.        Marland Kitchen atorvastatin (LIPITOR) 10 MG tablet Take 1 tablet (10 mg total) by mouth daily.  90 tablet  3    Allergies  Allergen Reactions  . Colchicine     GI problems  . Flexeril (Cyclobenzaprine Hcl)   . Zebeta     Patient Active Problem List  Diagnosis  . Benign hypertensive heart disease without heart failure  . Diabetes mellitus  . Dyslipidemia  . Chronic renal insufficiency, stage III (moderate)    History  Smoking status  . Former Smoker -- 1.0 packs/day for 45 years  . Types: Cigarettes  . Quit date: 09/07/1991  Smokeless tobacco  . Never Used    History  Alcohol Use  . Yes    Family History  Problem  Relation Age of Onset  . Stroke Father   . Stroke Mother   . Stroke Sister   . Stroke Brother   . Stroke Brother   . Stroke Brother     Review of Systems: Constitutional: no fever chills diaphoresis or fatigue or change in weight.  Head and neck: no hearing loss, no epistaxis, no photophobia or visual disturbance. Respiratory: No cough, shortness of breath or wheezing. Cardiovascular: No chest pain peripheral edema, palpitations. Gastrointestinal: No abdominal distention, no abdominal pain, no change in bowel habits hematochezia or melena. Genitourinary: No dysuria, no frequency, no urgency, no nocturia. Musculoskeletal:No arthralgias, no back pain, no gait disturbance or myalgias. Neurological: No dizziness, no headaches, no numbness, no seizures, no syncope, no weakness, no tremors. Hematologic: No lymphadenopathy, no easy bruising. Psychiatric: No confusion, no hallucinations, no sleep disturbance.    Physical Exam: Filed Vitals:   05/15/12 1017  BP: 139/68  Pulse: 68   the general appearance reveals a well-developed elderly gentleman who appears to be younger than his stated age.The head and neck exam reveals pupils equal and reactive.  Extraocular movements are full.  There is no scleral icterus.  The mouth and pharynx  are normal.  The neck is supple.  The carotids reveal no bruits.  The jugular venous pressure is normal.  The  thyroid is not enlarged.  There is no lymphadenopathy.  The chest is clear to percussion and auscultation.  There are no rales or rhonchi.  Expansion of the chest is symmetrical.  The precordium is quiet.  The first heart sound is normal.  The second heart sound is physiologically split.  There is no murmur gallop rub or click.  There is no abnormal lift or heave.  The abdomen is soft and nontender.  The bowel sounds are normal.  The liver and spleen are not enlarged.  There are no abdominal masses.  There are no abdominal bruits.  Extremities reveal good  pedal pulses.  There is no phlebitis or edema.  There is no cyanosis or clubbing.  Strength is normal and symmetrical in all extremities.  There is no lateralizing weakness.  There are no sensory deficits.  The skin is warm and dry.  There is no rash.  EKG today shows normal sinus rhythm and no ischemic changes.   Assessment / Plan:  Continue on same medication.  Recheck in 4 months for followup office visit and fasting lab work

## 2012-05-18 ENCOUNTER — Encounter (HOSPITAL_COMMUNITY)
Admission: RE | Admit: 2012-05-18 | Discharge: 2012-05-18 | Disposition: A | Discharge status: Home / Self Care | Payer: Medicare Other | Source: Ambulatory Visit | Attending: Nephrology | Admitting: Nephrology

## 2012-05-18 DIAGNOSIS — N183 Chronic kidney disease, stage 3 unspecified: Secondary | ICD-10-CM | POA: Insufficient documentation

## 2012-05-18 DIAGNOSIS — D638 Anemia in other chronic diseases classified elsewhere: Secondary | ICD-10-CM | POA: Diagnosis not present

## 2012-05-18 MED ORDER — EPOETIN ALFA 10000 UNIT/ML IJ SOLN: 20000.0000 [IU] | INTRAMUSCULAR | Status: DC

## 2012-05-18 MED ORDER — EPOETIN ALFA 20000 UNIT/ML IJ SOLN
INTRAMUSCULAR | Status: AC
  Administered 2012-05-18: 20000 [IU] via SUBCUTANEOUS
  Filled 2012-05-18: qty 1

## 2012-06-01 ENCOUNTER — Other Ambulatory Visit (HOSPITAL_COMMUNITY): Payer: Self-pay | Admitting: *Deleted

## 2012-06-08 ENCOUNTER — Encounter (HOSPITAL_COMMUNITY)
Admission: RE | Admit: 2012-06-08 | Discharge: 2012-06-08 | Disposition: A | Discharge status: Home / Self Care | Payer: Medicare Other | Source: Ambulatory Visit | Attending: Nephrology | Admitting: Nephrology

## 2012-06-08 DIAGNOSIS — D638 Anemia in other chronic diseases classified elsewhere: Secondary | ICD-10-CM | POA: Diagnosis not present

## 2012-06-08 DIAGNOSIS — N183 Chronic kidney disease, stage 3 unspecified: Secondary | ICD-10-CM | POA: Insufficient documentation

## 2012-06-08 LAB — RENAL FUNCTION PANEL
BUN: 40 mg/dL — ABNORMAL HIGH (ref 6–23)
Chloride: 108 mEq/L (ref 96–112)
Glucose, Bld: 213 mg/dL — ABNORMAL HIGH (ref 70–99)
Potassium: 4.8 mEq/L (ref 3.5–5.1)

## 2012-06-08 LAB — MAGNESIUM: Magnesium: 2 mg/dL (ref 1.5–2.5)

## 2012-06-08 LAB — IRON AND TIBC
Saturation Ratios: 37 % (ref 20–55)
TIBC: 298 ug/dL (ref 215–435)
UIBC: 189 ug/dL (ref 125–400)

## 2012-06-08 MED ORDER — EPOETIN ALFA 10000 UNIT/ML IJ SOLN: 20000.0000 [IU] | INTRAMUSCULAR | Status: DC

## 2012-06-08 MED ORDER — EPOETIN ALFA 20000 UNIT/ML IJ SOLN
INTRAMUSCULAR | Status: AC
  Administered 2012-06-08: 20000 [IU] via SUBCUTANEOUS
  Filled 2012-06-08: qty 1

## 2012-06-09 LAB — VITAMIN D 25 HYDROXY (VIT D DEFICIENCY, FRACTURES): Vit D, 25-Hydroxy: 32 ng/mL (ref 30–89)

## 2012-06-29 ENCOUNTER — Encounter (HOSPITAL_COMMUNITY): Payer: Medicare Other

## 2012-07-03 ENCOUNTER — Encounter (HOSPITAL_COMMUNITY)
Admission: RE | Admit: 2012-07-03 | Discharge: 2012-07-03 | Disposition: A | Discharge status: Home / Self Care | Payer: Medicare Other | Source: Ambulatory Visit | Attending: Nephrology | Admitting: Nephrology

## 2012-07-03 DIAGNOSIS — D638 Anemia in other chronic diseases classified elsewhere: Secondary | ICD-10-CM | POA: Insufficient documentation

## 2012-07-03 DIAGNOSIS — N183 Chronic kidney disease, stage 3 unspecified: Secondary | ICD-10-CM | POA: Insufficient documentation

## 2012-07-03 LAB — IRON AND TIBC
Iron: 94 ug/dL (ref 42–135)
TIBC: 286 ug/dL (ref 215–435)

## 2012-07-03 LAB — RENAL FUNCTION PANEL
CO2: 22 mEq/L (ref 19–32)
Calcium: 8.9 mg/dL (ref 8.4–10.5)
GFR calc Af Amer: 24 mL/min — ABNORMAL LOW (ref >90)
Glucose, Bld: 204 mg/dL — ABNORMAL HIGH (ref 70–99)
Sodium: 136 mEq/L (ref 135–145)

## 2012-07-03 MED ORDER — EPOETIN ALFA 20000 UNIT/ML IJ SOLN
INTRAMUSCULAR | Status: AC
  Administered 2012-07-03: 20000 [IU] via SUBCUTANEOUS
  Filled 2012-07-03: qty 1

## 2012-07-03 MED ORDER — EPOETIN ALFA 10000 UNIT/ML IJ SOLN: 20000.0000 [IU] | INTRAMUSCULAR | Status: DC

## 2012-07-11 ENCOUNTER — Other Ambulatory Visit: Payer: Self-pay | Admitting: *Deleted

## 2012-07-11 MED ORDER — ATORVASTATIN CALCIUM 10 MG PO TABS: 10.0000 mg | ORAL_TABLET | Freq: Every day | ORAL | Status: DC

## 2012-07-11 NOTE — Telephone Encounter (Signed)
Refilled atorvastatin

## 2012-07-23 ENCOUNTER — Other Ambulatory Visit (HOSPITAL_COMMUNITY): Payer: Self-pay | Admitting: *Deleted

## 2012-07-23 DIAGNOSIS — L570 Actinic keratosis: Secondary | ICD-10-CM | POA: Diagnosis not present

## 2012-07-23 DIAGNOSIS — L578 Other skin changes due to chronic exposure to nonionizing radiation: Secondary | ICD-10-CM | POA: Diagnosis not present

## 2012-07-23 DIAGNOSIS — L219 Seborrheic dermatitis, unspecified: Secondary | ICD-10-CM | POA: Diagnosis not present

## 2012-07-24 ENCOUNTER — Encounter (HOSPITAL_COMMUNITY)
Admission: RE | Admit: 2012-07-24 | Discharge: 2012-07-24 | Disposition: A | Discharge status: Home / Self Care | Payer: Medicare Other | Source: Ambulatory Visit | Attending: Nephrology | Admitting: Nephrology

## 2012-07-24 LAB — POCT HEMOGLOBIN-HEMACUE: Hemoglobin: 9.9 g/dL — ABNORMAL LOW (ref 13.0–17.0)

## 2012-07-24 MED ORDER — EPOETIN ALFA 20000 UNIT/ML IJ SOLN
INTRAMUSCULAR | Status: AC
  Administered 2012-07-24: 20000 [IU] via SUBCUTANEOUS
  Filled 2012-07-24: qty 1

## 2012-07-24 MED ORDER — EPOETIN ALFA 10000 UNIT/ML IJ SOLN: 20000.0000 [IU] | INTRAMUSCULAR | Status: DC

## 2012-07-31 DIAGNOSIS — N2581 Secondary hyperparathyroidism of renal origin: Secondary | ICD-10-CM | POA: Diagnosis not present

## 2012-07-31 DIAGNOSIS — D649 Anemia, unspecified: Secondary | ICD-10-CM | POA: Diagnosis not present

## 2012-07-31 DIAGNOSIS — N184 Chronic kidney disease, stage 4 (severe): Secondary | ICD-10-CM | POA: Diagnosis not present

## 2012-07-31 DIAGNOSIS — I129 Hypertensive chronic kidney disease with stage 1 through stage 4 chronic kidney disease, or unspecified chronic kidney disease: Secondary | ICD-10-CM | POA: Diagnosis not present

## 2012-08-13 ENCOUNTER — Other Ambulatory Visit (HOSPITAL_COMMUNITY): Payer: Self-pay | Admitting: *Deleted

## 2012-08-14 ENCOUNTER — Encounter (HOSPITAL_COMMUNITY)
Admission: RE | Admit: 2012-08-14 | Discharge: 2012-08-14 | Disposition: A | Discharge status: Home / Self Care | Payer: Medicare Other | Source: Ambulatory Visit | Attending: Nephrology | Admitting: Nephrology

## 2012-08-14 DIAGNOSIS — N183 Chronic kidney disease, stage 3 unspecified: Secondary | ICD-10-CM | POA: Diagnosis not present

## 2012-08-14 DIAGNOSIS — D638 Anemia in other chronic diseases classified elsewhere: Secondary | ICD-10-CM | POA: Insufficient documentation

## 2012-08-14 LAB — RENAL FUNCTION PANEL
Albumin: 3.8 g/dL (ref 3.5–5.2)
CO2: 22 mEq/L (ref 19–32)
Calcium: 8.9 mg/dL (ref 8.4–10.5)
Creatinine, Ser: 2.1 mg/dL — ABNORMAL HIGH (ref 0.50–1.35)
GFR calc non Af Amer: 27 mL/min — ABNORMAL LOW (ref >90)
Phosphorus: 2.3 mg/dL (ref 2.3–4.6)
Sodium: 138 mEq/L (ref 135–145)

## 2012-08-14 LAB — IRON AND TIBC
Iron: 85 ug/dL (ref 42–135)
Saturation Ratios: 29 % (ref 20–55)
TIBC: 293 ug/dL (ref 215–435)
UIBC: 208 ug/dL (ref 125–400)

## 2012-08-14 LAB — FERRITIN: Ferritin: 39 ng/mL (ref 22–322)

## 2012-08-14 MED ORDER — EPOETIN ALFA 20000 UNIT/ML IJ SOLN
INTRAMUSCULAR | Status: AC
  Administered 2012-08-14: 20000 [IU] via SUBCUTANEOUS
  Filled 2012-08-14: qty 1

## 2012-08-14 MED ORDER — EPOETIN ALFA 10000 UNIT/ML IJ SOLN: 20000.0000 [IU] | INTRAMUSCULAR | Status: DC

## 2012-09-04 ENCOUNTER — Encounter (HOSPITAL_COMMUNITY)
Admission: RE | Admit: 2012-09-04 | Discharge: 2012-09-04 | Disposition: A | Discharge status: Home / Self Care | Payer: Medicare Other | Source: Ambulatory Visit | Attending: Nephrology | Admitting: Nephrology

## 2012-09-04 DIAGNOSIS — N183 Chronic kidney disease, stage 3 unspecified: Secondary | ICD-10-CM | POA: Insufficient documentation

## 2012-09-04 DIAGNOSIS — D638 Anemia in other chronic diseases classified elsewhere: Secondary | ICD-10-CM | POA: Diagnosis not present

## 2012-09-04 LAB — RENAL FUNCTION PANEL
BUN: 41 mg/dL — ABNORMAL HIGH (ref 6–23)
Creatinine, Ser: 2.45 mg/dL — ABNORMAL HIGH (ref 0.50–1.35)
Glucose, Bld: 177 mg/dL — ABNORMAL HIGH (ref 70–99)
Phosphorus: 2.7 mg/dL (ref 2.3–4.6)
Potassium: 4.4 mEq/L (ref 3.5–5.1)

## 2012-09-04 LAB — POCT HEMOGLOBIN-HEMACUE: Hemoglobin: 10.8 g/dL — ABNORMAL LOW (ref 13.0–17.0)

## 2012-09-04 MED ORDER — EPOETIN ALFA 10000 UNIT/ML IJ SOLN: 20000.0000 [IU] | INTRAMUSCULAR | Status: DC

## 2012-09-04 MED ORDER — EPOETIN ALFA 20000 UNIT/ML IJ SOLN
INTRAMUSCULAR | Status: AC
  Administered 2012-09-04: 20000 [IU] via SUBCUTANEOUS
  Filled 2012-09-04: qty 1

## 2012-09-12 DIAGNOSIS — Z23 Encounter for immunization: Secondary | ICD-10-CM | POA: Diagnosis not present

## 2012-09-25 ENCOUNTER — Encounter (HOSPITAL_COMMUNITY): Payer: Medicare Other

## 2012-09-26 ENCOUNTER — Encounter (HOSPITAL_COMMUNITY)
Admission: RE | Admit: 2012-09-26 | Discharge: 2012-09-26 | Disposition: A | Discharge status: Home / Self Care | Payer: Medicare Other | Source: Ambulatory Visit | Attending: Nephrology | Admitting: Nephrology

## 2012-09-26 LAB — FERRITIN: Ferritin: 42 ng/mL (ref 22–322)

## 2012-09-26 LAB — RENAL FUNCTION PANEL
Albumin: 3.6 g/dL (ref 3.5–5.2)
BUN: 45 mg/dL — ABNORMAL HIGH (ref 6–23)
Creatinine, Ser: 2.32 mg/dL — ABNORMAL HIGH (ref 0.50–1.35)
Phosphorus: 3.4 mg/dL (ref 2.3–4.6)
Potassium: 4.8 mEq/L (ref 3.5–5.1)

## 2012-09-26 LAB — IRON AND TIBC
Saturation Ratios: 37 % (ref 20–55)
UIBC: 209 ug/dL (ref 125–400)

## 2012-09-26 MED ORDER — EPOETIN ALFA 10000 UNIT/ML IJ SOLN: 20000.0000 [IU] | INTRAMUSCULAR | Status: DC

## 2012-09-26 MED ORDER — EPOETIN ALFA 20000 UNIT/ML IJ SOLN
INTRAMUSCULAR | Status: AC
  Administered 2012-09-26: 20000 [IU]
  Filled 2012-09-26: qty 1

## 2012-10-03 ENCOUNTER — Ambulatory Visit (INDEPENDENT_AMBULATORY_CARE_PROVIDER_SITE_OTHER): Payer: Medicare Other | Admitting: Cardiology

## 2012-10-03 ENCOUNTER — Encounter: Payer: Self-pay | Admitting: Cardiology

## 2012-10-03 ENCOUNTER — Other Ambulatory Visit (INDEPENDENT_AMBULATORY_CARE_PROVIDER_SITE_OTHER): Payer: Medicare Other

## 2012-10-03 ENCOUNTER — Other Ambulatory Visit: Payer: Self-pay | Admitting: *Deleted

## 2012-10-03 VITALS — BP 136/68 | HR 68 | Resp 18 | Ht 69.6 in | Wt 152.0 lb

## 2012-10-03 DIAGNOSIS — E7800 Pure hypercholesterolemia, unspecified: Secondary | ICD-10-CM | POA: Diagnosis not present

## 2012-10-03 DIAGNOSIS — I119 Hypertensive heart disease without heart failure: Secondary | ICD-10-CM | POA: Diagnosis not present

## 2012-10-03 DIAGNOSIS — E785 Hyperlipidemia, unspecified: Secondary | ICD-10-CM

## 2012-10-03 LAB — HEPATIC FUNCTION PANEL
AST: 27 U/L (ref 0–37)
Alkaline Phosphatase: 81 U/L (ref 39–117)
Bilirubin, Direct: 0.1 mg/dL (ref 0.0–0.3)
Total Bilirubin: 0.5 mg/dL (ref 0.3–1.2)

## 2012-10-03 LAB — LIPID PANEL: Total CHOL/HDL Ratio: 3

## 2012-10-03 LAB — BASIC METABOLIC PANEL
CO2: 24 mEq/L (ref 19–32)
Calcium: 8.7 mg/dL (ref 8.4–10.5)
Creatinine, Ser: 2.4 mg/dL — ABNORMAL HIGH (ref 0.4–1.5)
Glucose, Bld: 136 mg/dL — ABNORMAL HIGH (ref 70–99)

## 2012-10-03 LAB — HEMOGLOBIN A1C: Hgb A1c MFr Bld: 6.4 % (ref 4.6–6.5)

## 2012-10-03 NOTE — Assessment & Plan Note (Signed)
He is followed closely by nephrology.  Almost every 3 weeks he has required a shot of Procrit.

## 2012-10-03 NOTE — Assessment & Plan Note (Signed)
We are checking lipids today.  Is not having any side effects from his Lipitor.

## 2012-10-03 NOTE — Patient Instructions (Addendum)
Will obtain labs today and call you with the results (lp,bmet,hfp,a1c)  Your physician recommends that you continue on your current medications as directed. Please refer to the Current Medication list given to you today.  Your physician recommends that you schedule a follow-up appointment in: 4 months with fasting labs (lp/bmet/hfp/a1c)

## 2012-10-03 NOTE — Progress Notes (Signed)
Quick Note:  Please report to patient. The recent labs are stable. Continue same medication and careful diet. ______ 

## 2012-10-03 NOTE — Assessment & Plan Note (Signed)
No headaches or dizzy spells.

## 2012-10-03 NOTE — Progress Notes (Signed)
[name] Date of Birth:  1925-09-21 [facility] [address] [place], [place]  [address] [phone]         Fax   [phone]  History of Present Illness:  This pleasant 78 year old gentleman is seen for a scheduled followup office visit. He has a past history of essential hypertension. He also has a history of dyslipidemia, diabetes, and mild renal insufficiency. Also has a history of hyperuricemia. He has anemia secondary to chronic renal disease and receives ejections of Procrit about every 3 weeks depending on his hemoglobin level.  Since last visit he has been feeling well.  He has not been experiencing any chest pain or increasing dyspnea.  Current Outpatient Prescriptions  Medication Sig Dispense Refill  . allopurinol (ZYLOPRIM) 100 MG tablet TAKE 1 TABLET DAILY  90 tablet  1  . aspirin 81 MG tablet Take 81 mg by mouth daily.        Marland Kitchen atorvastatin (LIPITOR) 10 MG tablet Take 10 mg by mouth daily.      Marland Kitchen JANUVIA 50 MG tablet TAKE 1 TABLET DAILY        (PATIENT IS DISCONTINUING  METFORMIN)  90 tablet  3  . metoprolol (TOPROL-XL) 50 MG 24 hr tablet TAKE 1 TABLET DAILY  90 tablet  3  . mirtazapine (REMERON) 7.5 MG tablet Take 7.5 mg by mouth as needed.      Marland Kitchen telmisartan-hydrochlorothiazide (MICARDIS HCT) 40-12.5 MG per tablet Take 1 tablet by mouth daily.        . [DISCONTINUED] mirtazapine (REMERON) 7.5 MG tablet Take 1 tablet (7.5 mg total) by mouth at bedtime.  90 tablet  3    Allergies  Allergen Reactions  . Colchicine     GI problems  . Flexeril (Cyclobenzaprine Hcl)   . Zebeta     Patient Active Problem List  Diagnosis  . Benign hypertensive heart disease without heart failure  . Diabetes mellitus  . Dyslipidemia  . Chronic renal insufficiency, stage III (moderate)    History  Smoking status  . Former Smoker -- 1.0 packs/day for 45 years  . Types: Cigarettes  . Quit date: 09/07/1991  Smokeless tobacco  . Never Used     History  Alcohol Use  . Yes    Family History  Problem Relation Age of Onset  . Stroke Father   . Stroke Mother   . Stroke Sister   . Stroke Brother   . Stroke Brother   . Stroke Brother     Review of Systems: Constitutional: no fever chills diaphoresis or fatigue or change in weight.  Head and neck: no hearing loss, no epistaxis, no photophobia or visual disturbance. Respiratory: No cough, shortness of breath or wheezing. Cardiovascular: No chest pain peripheral edema, palpitations. Gastrointestinal: No abdominal distention, no abdominal pain, no change in bowel habits hematochezia or melena. Genitourinary: No dysuria, no frequency, no urgency, no nocturia. Musculoskeletal:No arthralgias, no back pain, no gait disturbance or myalgias. Neurological: No dizziness, no headaches, no numbness, no seizures, no syncope, no weakness, no tremors. Hematologic: No lymphadenopathy, no easy bruising. Psychiatric: No confusion, no hallucinations, no sleep disturbance.    Physical Exam: Filed Vitals:   10/03/12 0842  BP: 136/68  Pulse: 68  Resp: 18   the general appearance reveals a well-developed thin gentleman in no distress.The head and neck exam reveals pupils equal and reactive.  Extraocular movements are full.  There is no scleral icterus.  The mouth and pharynx are normal.  The neck is supple.  The carotids reveal no bruits.  The jugular venous pressure is normal.  The  thyroid is not enlarged.  There is no lymphadenopathy.  The chest is clear to percussion and auscultation.  There are no rales or rhonchi.  Expansion of the chest is symmetrical.  The precordium is quiet.  The first heart sound is normal.  The second heart sound is physiologically split.  There is no murmur gallop rub or click.  There is no abnormal lift or heave.  The abdomen is soft and nontender.  The bowel sounds are normal.  The liver and spleen are not enlarged.  There are no abdominal masses.  There are no  abdominal bruits.  Extremities reveal good pedal pulses.  There is no phlebitis or edema.  There is no cyanosis or clubbing.  Strength is normal and symmetrical in all extremities.  There is no lateralizing weakness.  There are no sensory deficits.  The skin is warm and dry.  There is no rash.     Assessment / Plan: Continue same medication.  Blood work today is pending.  We are rechecking labs including hemoglobin A1c today.  Recheck in 4 months for office visit lipid panel hepatic function panel basal metabolic panel and [id number].

## 2012-10-03 NOTE — Assessment & Plan Note (Signed)
The patient has not been having any hypoglycemic episodes.  His energy level is good.  He has been playing golf with the seniors every Thursday 18 holes.

## 2012-10-04 ENCOUNTER — Telehealth: Payer: Self-pay | Admitting: *Deleted

## 2012-10-04 NOTE — Telephone Encounter (Signed)
Message copied by Earvin Hansen on Thu Oct 04, 2012  5:09 PM ------      Message from: Darlin Coco      Created: Wed Oct 03, 2012  3:22 PM       Please report to patient.  The recent labs are stable. Continue same medication and careful diet.

## 2012-10-04 NOTE — Telephone Encounter (Signed)
Advised patient of lab results  

## 2012-10-17 ENCOUNTER — Encounter (HOSPITAL_COMMUNITY)
Admission: RE | Admit: 2012-10-17 | Discharge: 2012-10-17 | Disposition: A | Discharge status: Home / Self Care | Payer: Medicare Other | Source: Ambulatory Visit | Attending: Nephrology | Admitting: Nephrology

## 2012-10-17 DIAGNOSIS — N183 Chronic kidney disease, stage 3 unspecified: Secondary | ICD-10-CM | POA: Diagnosis not present

## 2012-10-17 DIAGNOSIS — D638 Anemia in other chronic diseases classified elsewhere: Secondary | ICD-10-CM | POA: Insufficient documentation

## 2012-10-17 MED ORDER — EPOETIN ALFA 10000 UNIT/ML IJ SOLN: 20000.0000 [IU] | INTRAMUSCULAR | Status: DC

## 2012-10-17 MED ORDER — EPOETIN ALFA 20000 UNIT/ML IJ SOLN
INTRAMUSCULAR | Status: AC
  Administered 2012-10-17: 20000 [IU] via SUBCUTANEOUS
  Filled 2012-10-17: qty 1

## 2012-10-22 ENCOUNTER — Other Ambulatory Visit: Payer: Self-pay

## 2012-10-22 MED ORDER — ALLOPURINOL 100 MG PO TABS: 100.0000 mg | ORAL_TABLET | Freq: Every day | ORAL | Status: DC

## 2012-10-23 ENCOUNTER — Other Ambulatory Visit: Payer: Self-pay | Admitting: *Deleted

## 2012-10-23 MED ORDER — SITAGLIPTIN PHOSPHATE 50 MG PO TABS: 50.0000 mg | ORAL_TABLET | Freq: Every day | ORAL | Status: DC

## 2012-10-23 NOTE — Telephone Encounter (Signed)
New Problem:    Patient called in wanting to know if he could change his telmisartan-hydrochlorothiazide (MICARDIS HCT) 40-12.5 MG per tablet to see if there were any cheaper alternatives.  Please call back.

## 2012-10-29 NOTE — Telephone Encounter (Signed)
Okay to switch to losartan HCT 100/12.5 daily.

## 2012-10-29 NOTE — Telephone Encounter (Signed)
Will forward to  Dr. Brackbill for review 

## 2012-11-01 ENCOUNTER — Telehealth: Payer: Self-pay | Admitting: *Deleted

## 2012-11-01 DIAGNOSIS — I119 Hypertensive heart disease without heart failure: Secondary | ICD-10-CM

## 2012-11-01 NOTE — Telephone Encounter (Signed)
Left message to call back   Darlin Coco, MD 10/29/2012 9:31 PM Signed  Faythe Ghee to switch to losartan HCT 100/12.5 daily. Alvina Filbert, LPN [id number] PM Signed  Will forward to Dr. Mare Ferrari for review Kristian Covey 10/23/2012 9:43 AM Signed  New Problem:  Patient called in wanting to know if he could change his telmisartan-hydrochlorothiazide (MICARDIS HCT) 40-12.5 MG per tablet to see if there were any cheaper alternatives. Please call back.

## 2012-11-05 MED ORDER — LOSARTAN POTASSIUM-HCTZ 100-12.5 MG PO TABS: 1.0000 | ORAL_TABLET | Freq: Every day | ORAL | Status: DC

## 2012-11-05 NOTE — Telephone Encounter (Signed)
New problem:   Returning call back to nurse.

## 2012-11-05 NOTE — Telephone Encounter (Signed)
Advised patient and sent Rx to [facility]

## 2012-11-06 ENCOUNTER — Other Ambulatory Visit (HOSPITAL_COMMUNITY): Payer: Self-pay | Admitting: *Deleted

## 2012-11-07 ENCOUNTER — Encounter (HOSPITAL_COMMUNITY)
Admission: RE | Admit: 2012-11-07 | Discharge: 2012-11-07 | Disposition: A | Discharge status: Home / Self Care | Payer: Medicare Other | Source: Ambulatory Visit | Attending: Nephrology | Admitting: Nephrology

## 2012-11-07 DIAGNOSIS — N183 Chronic kidney disease, stage 3 unspecified: Secondary | ICD-10-CM | POA: Insufficient documentation

## 2012-11-07 DIAGNOSIS — D638 Anemia in other chronic diseases classified elsewhere: Secondary | ICD-10-CM | POA: Insufficient documentation

## 2012-11-07 LAB — RENAL FUNCTION PANEL
Albumin: 3.6 g/dL (ref 3.5–5.2)
Chloride: 108 mEq/L (ref 96–112)
GFR calc Af Amer: 29 mL/min — ABNORMAL LOW (ref >90)
GFR calc non Af Amer: 25 mL/min — ABNORMAL LOW (ref >90)
Potassium: 4.8 mEq/L (ref 3.5–5.1)

## 2012-11-07 LAB — FERRITIN: Ferritin: 30 ng/mL (ref 22–322)

## 2012-11-07 LAB — POCT HEMOGLOBIN-HEMACUE: Hemoglobin: 10.6 g/dL — ABNORMAL LOW (ref 13.0–17.0)

## 2012-11-07 LAB — IRON AND TIBC: UIBC: 230 ug/dL (ref 125–400)

## 2012-11-07 MED ORDER — EPOETIN ALFA 20000 UNIT/ML IJ SOLN
INTRAMUSCULAR | Status: AC
  Administered 2012-11-07: 20000 [IU] via SUBCUTANEOUS
  Filled 2012-11-07: qty 1

## 2012-11-07 MED ORDER — EPOETIN ALFA 10000 UNIT/ML IJ SOLN: 20000.0000 [IU] | INTRAMUSCULAR | Status: DC

## 2012-11-30 ENCOUNTER — Encounter (HOSPITAL_COMMUNITY)
Admission: RE | Admit: 2012-11-30 | Discharge: 2012-11-30 | Disposition: A | Discharge status: Home / Self Care | Payer: Medicare Other | Source: Ambulatory Visit | Attending: Nephrology | Admitting: Nephrology

## 2012-11-30 DIAGNOSIS — N183 Chronic kidney disease, stage 3 unspecified: Secondary | ICD-10-CM | POA: Insufficient documentation

## 2012-11-30 DIAGNOSIS — D638 Anemia in other chronic diseases classified elsewhere: Secondary | ICD-10-CM | POA: Diagnosis not present

## 2012-11-30 LAB — POCT HEMOGLOBIN-HEMACUE: Hemoglobin: 10.8 g/dL — ABNORMAL LOW (ref 13.0–17.0)

## 2012-11-30 MED ORDER — EPOETIN ALFA 10000 UNIT/ML IJ SOLN: 20000.0000 [IU] | INTRAMUSCULAR | Status: DC

## 2012-11-30 MED ORDER — EPOETIN ALFA 20000 UNIT/ML IJ SOLN
INTRAMUSCULAR | Status: AC
  Administered 2012-11-30: 20000 [IU] via SUBCUTANEOUS
  Filled 2012-11-30: qty 1

## 2012-12-03 DIAGNOSIS — H26499 Other secondary cataract, unspecified eye: Secondary | ICD-10-CM | POA: Diagnosis not present

## 2012-12-17 DIAGNOSIS — N184 Chronic kidney disease, stage 4 (severe): Secondary | ICD-10-CM | POA: Diagnosis not present

## 2012-12-21 ENCOUNTER — Encounter (HOSPITAL_COMMUNITY)
Admission: RE | Admit: 2012-12-21 | Discharge: 2012-12-21 | Disposition: A | Discharge status: Home / Self Care | Payer: Medicare Other | Source: Ambulatory Visit | Attending: Nephrology | Admitting: Nephrology

## 2012-12-21 LAB — RENAL FUNCTION PANEL
CO2: 21 mEq/L (ref 19–32)
GFR calc Af Amer: 33 mL/min — ABNORMAL LOW (ref >90)
Glucose, Bld: 198 mg/dL — ABNORMAL HIGH (ref 70–99)
Phosphorus: 2.7 mg/dL (ref 2.3–4.6)
Potassium: 4.7 mEq/L (ref 3.5–5.1)
Sodium: 138 mEq/L (ref 135–145)

## 2012-12-21 LAB — POCT HEMOGLOBIN-HEMACUE: Hemoglobin: 10.6 g/dL — ABNORMAL LOW (ref 13.0–17.0)

## 2012-12-21 MED ORDER — EPOETIN ALFA 20000 UNIT/ML IJ SOLN
INTRAMUSCULAR | Status: AC
  Administered 2012-12-21: 20000 [IU] via SUBCUTANEOUS
  Filled 2012-12-21: qty 1

## 2012-12-21 MED ORDER — EPOETIN ALFA 10000 UNIT/ML IJ SOLN: 20000.0000 [IU] | INTRAMUSCULAR | Status: DC

## 2012-12-27 DIAGNOSIS — H10019 Acute follicular conjunctivitis, unspecified eye: Secondary | ICD-10-CM | POA: Diagnosis not present

## 2012-12-31 ENCOUNTER — Other Ambulatory Visit: Payer: Self-pay

## 2012-12-31 MED ORDER — SITAGLIPTIN PHOSPHATE 50 MG PO TABS: 50.0000 mg | ORAL_TABLET | Freq: Every day | ORAL | Status: DC

## 2012-12-31 MED ORDER — METOPROLOL SUCCINATE ER 50 MG PO TB24: 50.0000 mg | ORAL_TABLET | Freq: Every day | ORAL | Status: DC

## 2013-01-01 ENCOUNTER — Other Ambulatory Visit: Payer: Self-pay

## 2013-01-01 MED ORDER — METOPROLOL SUCCINATE ER 50 MG PO TB24: 50.0000 mg | ORAL_TABLET | Freq: Every day | ORAL | Status: DC

## 2013-01-01 MED ORDER — SITAGLIPTIN PHOSPHATE 50 MG PO TABS: 50.0000 mg | ORAL_TABLET | Freq: Every day | ORAL | Status: DC

## 2013-01-11 ENCOUNTER — Encounter (HOSPITAL_COMMUNITY): Payer: Medicare Other

## 2013-01-21 ENCOUNTER — Encounter (HOSPITAL_COMMUNITY)
Admission: RE | Admit: 2013-01-21 | Discharge: 2013-01-21 | Disposition: A | Discharge status: Home / Self Care | Payer: Medicare Other | Source: Ambulatory Visit | Attending: Nephrology | Admitting: Nephrology

## 2013-01-21 DIAGNOSIS — N183 Chronic kidney disease, stage 3 unspecified: Secondary | ICD-10-CM | POA: Diagnosis not present

## 2013-01-21 DIAGNOSIS — D638 Anemia in other chronic diseases classified elsewhere: Secondary | ICD-10-CM | POA: Insufficient documentation

## 2013-01-21 LAB — POCT HEMOGLOBIN-HEMACUE: Hemoglobin: 10 g/dL — ABNORMAL LOW (ref 13.0–17.0)

## 2013-01-21 MED ORDER — EPOETIN ALFA 20000 UNIT/ML IJ SOLN
INTRAMUSCULAR | Status: AC
  Administered 2013-01-21: 20000 [IU] via SUBCUTANEOUS
  Filled 2013-01-21: qty 1

## 2013-01-21 MED ORDER — EPOETIN ALFA 10000 UNIT/ML IJ SOLN: 20000.0000 [IU] | INTRAMUSCULAR | Status: DC

## 2013-01-28 DIAGNOSIS — Z85820 Personal history of malignant melanoma of skin: Secondary | ICD-10-CM | POA: Diagnosis not present

## 2013-01-28 DIAGNOSIS — L723 Sebaceous cyst: Secondary | ICD-10-CM | POA: Diagnosis not present

## 2013-01-29 ENCOUNTER — Other Ambulatory Visit (INDEPENDENT_AMBULATORY_CARE_PROVIDER_SITE_OTHER): Payer: Medicare Other

## 2013-01-29 ENCOUNTER — Ambulatory Visit (INDEPENDENT_AMBULATORY_CARE_PROVIDER_SITE_OTHER): Payer: Medicare Other | Admitting: Cardiology

## 2013-01-29 ENCOUNTER — Encounter: Payer: Self-pay | Admitting: Cardiology

## 2013-01-29 VITALS — BP 150/60 | HR 60 | Ht 68.0 in | Wt 151.0 lb

## 2013-01-29 DIAGNOSIS — E785 Hyperlipidemia, unspecified: Secondary | ICD-10-CM | POA: Diagnosis not present

## 2013-01-29 DIAGNOSIS — I119 Hypertensive heart disease without heart failure: Secondary | ICD-10-CM

## 2013-01-29 LAB — HEPATIC FUNCTION PANEL
AST: 27 U/L (ref 0–37)
Albumin: 3.8 g/dL (ref 3.5–5.2)
Alkaline Phosphatase: 92 U/L (ref 39–117)
Bilirubin, Direct: 0.1 mg/dL (ref 0.0–0.3)

## 2013-01-29 LAB — BASIC METABOLIC PANEL
BUN: 36 mg/dL — ABNORMAL HIGH (ref 6–23)
CO2: 24 mEq/L (ref 19–32)
Calcium: 8.8 mg/dL (ref 8.4–10.5)
Creatinine, Ser: 2.3 mg/dL — ABNORMAL HIGH (ref 0.4–1.5)

## 2013-01-29 LAB — LIPID PANEL
Cholesterol: 118 mg/dL (ref 0–200)
Total CHOL/HDL Ratio: 3
Triglycerides: 140 mg/dL (ref 0.0–149.0)

## 2013-01-29 NOTE — Patient Instructions (Addendum)
Your physician wants you to follow-up in: [place] will receive a reminder letter in the mail two months in advance. If you don't receive a letter, please call our office to schedule the follow-up appointment.   CONTINUE ON THE SAME MEDICINE  Your physician recommends that you return for lab work in: [place] 4 MONTHS

## 2013-01-29 NOTE — Assessment & Plan Note (Signed)
The patient is on low-dose Lipitor.  He is not having any myalgias.  Lab work is pending today

## 2013-01-29 NOTE — Progress Notes (Signed)
Gerald Jacobs Date of Birth:  Jun 21, 1925 [facility] [address] [place], [place]  [address] [phone]         Fax   [phone]  History of Present Illness: This pleasant 79 year old gentleman is seen for a scheduled followup office visit. He has a past history of essential hypertension. He also has a history of dyslipidemia, diabetes, and mild renal insufficiency. Also has a history of hyperuricemia. He has anemia secondary to chronic renal disease and receives ejections of Procrit about every 3 weeks depending on his hemoglobin level.  Dr. Posey Pronto is his nephrologist. Since last visit he has been feeling well. He has not been experiencing any chest pain or increasing dyspnea.  He does not check his blood sugars at home.  He does not have a diabetologist.   Current Outpatient Prescriptions  Medication Sig Dispense Refill  . allopurinol (ZYLOPRIM) 100 MG tablet Take 1 tablet (100 mg total) by mouth daily.  90 tablet  3  . aspirin 81 MG tablet Take 81 mg by mouth daily.        Marland Kitchen atorvastatin (LIPITOR) 10 MG tablet Take 10 mg by mouth daily.      Marland Kitchen losartan-hydrochlorothiazide (HYZAAR) 100-12.5 MG per tablet Take 1 tablet by mouth daily.  90 tablet  3  . metoprolol succinate (TOPROL-XL) 50 MG 24 hr tablet Take 1 tablet (50 mg total) by mouth daily. Take with or immediately following a meal.  90 tablet  3  . mirtazapine (REMERON) 7.5 MG tablet Take 7.5 mg by mouth as needed.      . sitaGLIPtin (JANUVIA) 50 MG tablet Take 1 tablet (50 mg total) by mouth daily.  90 tablet  3   No current facility-administered medications for this visit.    Allergies  Allergen Reactions  . Colchicine     GI problems  . Flexeril (Cyclobenzaprine Hcl)   . Zebeta     Patient Active Problem List  Diagnosis  . Benign hypertensive heart disease without heart failure  . Diabetes mellitus  . Dyslipidemia  . Chronic renal insufficiency, stage III (moderate)    History  Smoking  status  . Former Smoker -- 1.00 packs/day for 45 years  . Types: Cigarettes  . Quit date: 09/07/1991  Smokeless tobacco  . Never Used    History  Alcohol Use  . Yes    Family History  Problem Relation Age of Onset  . Stroke Father   . Stroke Mother   . Stroke Sister   . Stroke Brother   . Stroke Brother   . Stroke Brother     Review of Systems: Constitutional: no fever chills diaphoresis or fatigue or change in weight.  Head and neck: no hearing loss, no epistaxis, no photophobia or visual disturbance. Respiratory: No cough, shortness of breath or wheezing. Cardiovascular: No chest pain peripheral edema, palpitations. Gastrointestinal: No abdominal distention, no abdominal pain, no change in bowel habits hematochezia or melena. Genitourinary: No dysuria, no frequency, no urgency, no nocturia. Musculoskeletal:No arthralgias, no back pain, no gait disturbance or myalgias. Neurological: No dizziness, no headaches, no numbness, no seizures, no syncope, no weakness, no tremors. Hematologic: No lymphadenopathy, no easy bruising. Psychiatric: No confusion, no hallucinations, no sleep disturbance.    Physical Exam: Filed Vitals:   01/29/13 1012  BP: 150/60  Pulse: 60   the general appearance reveals a well-developed well-nourished gentleman in no distress.The head and neck exam reveals pupils equal and reactive.  Extraocular movements are  full.  There is no scleral icterus.  The mouth and pharynx are normal.  The neck is supple.  The carotids reveal no bruits.  The jugular venous pressure is normal.  The  thyroid is not enlarged.  There is no lymphadenopathy.  The chest is clear to percussion and auscultation.  There are no rales or rhonchi.  Expansion of the chest is symmetrical.  The precordium is quiet.  The first heart sound is normal.  The second heart sound is physiologically split.  There is no murmur gallop rub or click.  There is no abnormal lift or heave.  The abdomen is  soft and nontender.  The bowel sounds are normal.  The liver and spleen are not enlarged.  There are no abdominal masses.  There are no abdominal bruits.  Extremities reveal good pedal pulses.  There is no phlebitis or edema.  There is no cyanosis or clubbing.  Strength is normal and symmetrical in all extremities.  There is no lateralizing weakness.  There are no sensory deficits.  The skin is warm and dry.  There is no rash.     Assessment / Plan: Continue same medication.  Await results of today's lab work.  Consider referral to diabetes specialist.  Recheck in 4 months for followup office visit and fasting lab work.

## 2013-01-29 NOTE — Assessment & Plan Note (Signed)
The patient has not been having any chest pain or increased shortness of breath.  He stays physically active.  He plays golf every Thursday when the weather permits.

## 2013-01-29 NOTE — Assessment & Plan Note (Signed)
The patient has not been having any hypoglycemic episodes.  We are checking and A1c today.  If significantly elevated we will talk to him about referral to a diabetologist.  I mention this possibility to him today.  He is not a good candidate for metformin because of his renal insufficiency.  He would prefer not to start insulin.  He is concerned about the cost of his present medication which is Januvia.  Consider diabetologist to follow his diabetes and help sort out these questions.

## 2013-02-05 ENCOUNTER — Telehealth: Payer: Self-pay | Admitting: *Deleted

## 2013-02-05 NOTE — Telephone Encounter (Signed)
Advised patient of lab results  

## 2013-02-05 NOTE — Telephone Encounter (Signed)
Message copied by Earvin Hansen on Tue Feb 05, 2013  3:44 PM ------      Message from: Darlin Coco      Created: Wed Jan 30, 2013  6:00 PM       Please report. Diabetic control is better. BS 135. A1C 6.3 good.  CSD.       Lipids and LFTs normal.        Kidney function stable for him. ------

## 2013-02-06 ENCOUNTER — Other Ambulatory Visit: Payer: Medicare Other

## 2013-02-06 ENCOUNTER — Ambulatory Visit: Payer: Medicare Other | Admitting: Cardiology

## 2013-02-08 ENCOUNTER — Other Ambulatory Visit (HOSPITAL_COMMUNITY): Payer: Self-pay | Admitting: *Deleted

## 2013-02-11 ENCOUNTER — Encounter (HOSPITAL_COMMUNITY)
Admission: RE | Admit: 2013-02-11 | Discharge: 2013-02-11 | Disposition: A | Discharge status: Home / Self Care | Payer: Medicare Other | Source: Ambulatory Visit | Attending: Nephrology | Admitting: Nephrology

## 2013-02-11 DIAGNOSIS — D638 Anemia in other chronic diseases classified elsewhere: Secondary | ICD-10-CM | POA: Insufficient documentation

## 2013-02-11 DIAGNOSIS — N183 Chronic kidney disease, stage 3 unspecified: Secondary | ICD-10-CM | POA: Insufficient documentation

## 2013-02-11 LAB — IRON AND TIBC: Iron: 81 ug/dL (ref 42–135)

## 2013-02-11 LAB — RENAL FUNCTION PANEL
Albumin: 3.6 g/dL (ref 3.5–5.2)
BUN: 43 mg/dL — ABNORMAL HIGH (ref 6–23)
Creatinine, Ser: 2.24 mg/dL — ABNORMAL HIGH (ref 0.50–1.35)
Phosphorus: 2.7 mg/dL (ref 2.3–4.6)

## 2013-02-11 LAB — FERRITIN: Ferritin: 32 ng/mL (ref 22–322)

## 2013-02-11 MED ORDER — EPOETIN ALFA 10000 UNIT/ML IJ SOLN: 20000.0000 [IU] | INTRAMUSCULAR | Status: DC

## 2013-02-11 MED ORDER — EPOETIN ALFA 20000 UNIT/ML IJ SOLN
INTRAMUSCULAR | Status: AC
  Administered 2013-02-11: 20000 [IU] via SUBCUTANEOUS
  Filled 2013-02-11: qty 1

## 2013-03-04 ENCOUNTER — Encounter (HOSPITAL_COMMUNITY)
Admission: RE | Admit: 2013-03-04 | Discharge: 2013-03-04 | Disposition: A | Discharge status: Home / Self Care | Payer: Medicare Other | Source: Ambulatory Visit | Attending: Nephrology | Admitting: Nephrology

## 2013-03-04 DIAGNOSIS — N183 Chronic kidney disease, stage 3 unspecified: Secondary | ICD-10-CM | POA: Insufficient documentation

## 2013-03-04 DIAGNOSIS — D638 Anemia in other chronic diseases classified elsewhere: Secondary | ICD-10-CM | POA: Insufficient documentation

## 2013-03-04 MED ORDER — EPOETIN ALFA 20000 UNIT/ML IJ SOLN
INTRAMUSCULAR | Status: AC
  Administered 2013-03-04: 20000 [IU]
  Filled 2013-03-04: qty 1

## 2013-03-04 MED ORDER — EPOETIN ALFA 10000 UNIT/ML IJ SOLN: 20000.0000 [IU] | INTRAMUSCULAR | Status: DC

## 2013-03-25 ENCOUNTER — Encounter (HOSPITAL_COMMUNITY): Payer: Medicare Other

## 2013-03-26 ENCOUNTER — Encounter (HOSPITAL_COMMUNITY)
Admission: RE | Admit: 2013-03-26 | Discharge: 2013-03-26 | Disposition: A | Discharge status: Home / Self Care | Payer: Medicare Other | Source: Ambulatory Visit | Attending: Nephrology | Admitting: Nephrology

## 2013-03-26 LAB — RENAL FUNCTION PANEL
Albumin: 3.6 g/dL (ref 3.5–5.2)
BUN: 31 mg/dL — ABNORMAL HIGH (ref 6–23)
GFR calc Af Amer: 31 mL/min — ABNORMAL LOW (ref >90)
Phosphorus: 2.6 mg/dL (ref 2.3–4.6)
Potassium: 4.4 mEq/L (ref 3.5–5.1)
Sodium: 135 mEq/L (ref 135–145)

## 2013-03-26 LAB — IRON AND TIBC
Saturation Ratios: 22 % (ref 20–55)
UIBC: 283 ug/dL (ref 125–400)

## 2013-03-26 LAB — MAGNESIUM: Magnesium: 2 mg/dL (ref 1.5–2.5)

## 2013-03-26 LAB — POCT HEMOGLOBIN-HEMACUE: Hemoglobin: 10.2 g/dL — ABNORMAL LOW (ref 13.0–17.0)

## 2013-03-26 LAB — PROTEIN / CREATININE RATIO, URINE
Creatinine, Urine: 50.64 mg/dL
Protein Creatinine Ratio: 0.58 — ABNORMAL HIGH (ref 0.00–0.15)

## 2013-03-26 MED ORDER — EPOETIN ALFA 20000 UNIT/ML IJ SOLN
INTRAMUSCULAR | Status: AC
  Administered 2013-03-26: 20000 [IU] via SUBCUTANEOUS
  Filled 2013-03-26: qty 1

## 2013-03-26 MED ORDER — EPOETIN ALFA 10000 UNIT/ML IJ SOLN: 20000.0000 [IU] | INTRAMUSCULAR | Status: DC

## 2013-04-12 DIAGNOSIS — I129 Hypertensive chronic kidney disease with stage 1 through stage 4 chronic kidney disease, or unspecified chronic kidney disease: Secondary | ICD-10-CM | POA: Diagnosis not present

## 2013-04-12 DIAGNOSIS — N2581 Secondary hyperparathyroidism of renal origin: Secondary | ICD-10-CM | POA: Diagnosis not present

## 2013-04-12 DIAGNOSIS — D649 Anemia, unspecified: Secondary | ICD-10-CM | POA: Diagnosis not present

## 2013-04-12 DIAGNOSIS — N184 Chronic kidney disease, stage 4 (severe): Secondary | ICD-10-CM | POA: Diagnosis not present

## 2013-04-15 ENCOUNTER — Encounter (HOSPITAL_COMMUNITY)
Admission: RE | Admit: 2013-04-15 | Discharge: 2013-04-15 | Disposition: A | Discharge status: Home / Self Care | Payer: Medicare Other | Source: Ambulatory Visit | Attending: Nephrology | Admitting: Nephrology

## 2013-04-15 DIAGNOSIS — D638 Anemia in other chronic diseases classified elsewhere: Secondary | ICD-10-CM | POA: Insufficient documentation

## 2013-04-15 DIAGNOSIS — N183 Chronic kidney disease, stage 3 unspecified: Secondary | ICD-10-CM | POA: Diagnosis not present

## 2013-04-15 MED ORDER — EPOETIN ALFA 10000 UNIT/ML IJ SOLN: 20000.0000 [IU] | INTRAMUSCULAR | Status: DC

## 2013-04-15 MED ORDER — EPOETIN ALFA 20000 UNIT/ML IJ SOLN
INTRAMUSCULAR | Status: AC
  Administered 2013-04-15: 20000 [IU] via SUBCUTANEOUS
  Filled 2013-04-15: qty 1

## 2013-04-16 ENCOUNTER — Encounter (HOSPITAL_COMMUNITY): Payer: Medicare Other

## 2013-05-03 ENCOUNTER — Other Ambulatory Visit (HOSPITAL_COMMUNITY): Payer: Self-pay | Admitting: *Deleted

## 2013-05-06 ENCOUNTER — Encounter (HOSPITAL_COMMUNITY)
Admission: RE | Admit: 2013-05-06 | Discharge: 2013-05-06 | Disposition: A | Discharge status: Home / Self Care | Payer: Medicare Other | Source: Ambulatory Visit | Attending: Nephrology | Admitting: Nephrology

## 2013-05-06 DIAGNOSIS — N183 Chronic kidney disease, stage 3 unspecified: Secondary | ICD-10-CM | POA: Diagnosis not present

## 2013-05-06 DIAGNOSIS — D638 Anemia in other chronic diseases classified elsewhere: Secondary | ICD-10-CM | POA: Diagnosis not present

## 2013-05-06 LAB — RENAL FUNCTION PANEL
Albumin: 3.5 g/dL (ref 3.5–5.2)
CO2: 21 mEq/L (ref 19–32)
Calcium: 8.6 mg/dL (ref 8.4–10.5)
Chloride: 108 mEq/L (ref 96–112)
GFR calc Af Amer: 29 mL/min — ABNORMAL LOW (ref >90)
GFR calc non Af Amer: 25 mL/min — ABNORMAL LOW (ref >90)
Sodium: 139 mEq/L (ref 135–145)

## 2013-05-06 LAB — FERRITIN: Ferritin: 22 ng/mL (ref 22–322)

## 2013-05-06 LAB — IRON AND TIBC
TIBC: 315 ug/dL (ref 215–435)
UIBC: 212 ug/dL (ref 125–400)

## 2013-05-06 MED ORDER — EPOETIN ALFA 10000 UNIT/ML IJ SOLN: 20000.0000 [IU] | INTRAMUSCULAR | Status: DC

## 2013-05-20 ENCOUNTER — Ambulatory Visit (INDEPENDENT_AMBULATORY_CARE_PROVIDER_SITE_OTHER): Payer: Medicare Other | Admitting: Emergency Medicine

## 2013-05-20 ENCOUNTER — Telehealth: Payer: Self-pay | Admitting: Cardiology

## 2013-05-20 VITALS — BP 128/62 | HR 66 | Temp 97.6°F | Resp 16 | Ht 66.25 in | Wt 150.6 lb

## 2013-05-20 DIAGNOSIS — R0609 Other forms of dyspnea: Secondary | ICD-10-CM

## 2013-05-20 DIAGNOSIS — R1013 Epigastric pain: Secondary | ICD-10-CM

## 2013-05-20 DIAGNOSIS — R0989 Other specified symptoms and signs involving the circulatory and respiratory systems: Secondary | ICD-10-CM | POA: Diagnosis not present

## 2013-05-20 LAB — COMPREHENSIVE METABOLIC PANEL
ALT: 189 U/L — ABNORMAL HIGH (ref 0–53)
Albumin: 4.2 g/dL (ref 3.5–5.2)
CO2: 22 mEq/L (ref 19–32)
Calcium: 8.9 mg/dL (ref 8.4–10.5)
Chloride: 104 mEq/L (ref 96–112)
Glucose, Bld: 127 mg/dL — ABNORMAL HIGH (ref 70–99)
Potassium: 5 mEq/L (ref 3.5–5.3)
Sodium: 135 mEq/L (ref 135–145)
Total Protein: 7.4 g/dL (ref 6.0–8.3)

## 2013-05-20 LAB — POCT CBC
Granulocyte percent: 60 %G (ref 37–80)
HCT, POC: 35 % — AB (ref 43.5–53.7)
Hemoglobin: 10.8 g/dL — AB (ref 14.1–18.1)
Lymph, poc: 2.8 (ref 0.6–3.4)
MCHC: 30.9 g/dL — AB (ref 31.8–35.4)
MPV: 10.8 fL (ref 0–99.8)
POC Granulocyte: 5.1 (ref 2–6.9)
RBC: 3.72 M/uL — AB (ref 4.69–6.13)

## 2013-05-20 LAB — LIPASE: Lipase: 63 U/L (ref 0–75)

## 2013-05-20 LAB — AMYLASE: Amylase: 49 U/L (ref 0–105)

## 2013-05-20 NOTE — Telephone Encounter (Signed)
Patient states he had sudden onset of nausea and vomiting on Thursday and Saturday, did eat something fried with meal on each occasion. Patient is concerned that it may be his gallbladder. Patient wanted to see  Dr. Mare Ferrari and advised he was out of the office this week. Did recommend patient go to Urgent Care to be evaluated, no PCP. Patient verbalized understanding.

## 2013-05-20 NOTE — Telephone Encounter (Signed)
New Problem  Pt thinks he is having gall bladder problems. He wants to know what to do with it.

## 2013-05-20 NOTE — Progress Notes (Signed)
[facility] [address], [place] [address] [phone]  Date:  05/20/2013   Name:  Gerald Jacobs   DOB:  [birth year]   MRN:  [id number]  PCP:  No primary provider on file.    Chief Complaint: Abdominal Pain   History of Present Illness:  Gerald Jacobs is a 79 y.o. very pleasant male patient who presents with the following:  Ill Thursday and Saturday this past week with nausea and vomiting associated with epigastric and upper abdominal pain.  No fever or chills, no stool change.  His son in law who is a physician primed him to think the problem is his gall bladder.  He denies chest pain, cough, wheezing or shortness o f breath.  No radiation of pain, chest pain, sensation of rapid or irregular heart beat.  No history of gall bladder disease.  No further nausea or pain.  No improvement with over the counter medications or other home remedies. Denies other complaint or health concern today.   Patient Active Problem List   Diagnosis Date Noted  . Benign hypertensive heart disease without heart failure 09/07/2011  . Type II or unspecified type diabetes mellitus without mention of complication, uncontrolled 09/07/2011  . Dyslipidemia 09/07/2011  . Chronic renal insufficiency, stage III (moderate) 09/07/2011    Past Medical History  Diagnosis Date  . Essential hypertension   . Mild renal insufficiency   . Diabetes mellitus     Adult onset  . Chronic anemia     With normal iron studies and normal serum protein electrophoresis  . Coronary artery disease     Mild  . History of gout   . Nocturia     x2  . Osteoarthritis     Past Surgical History  Procedure Laterality Date  . Knee surgery  1979  . Prostate surgery  1993    Prostate Cancer    History  Substance Use Topics  . Smoking status: Former Smoker -- 1.00 packs/day for 45 years    Types: Cigarettes    Quit date: 09/07/1991  . Smokeless tobacco: Never Used  . Alcohol Use: No    Family  History  Problem Relation Age of Onset  . Stroke Father   . Stroke Mother   . Stroke Sister   . Stroke Brother   . Stroke Brother   . Stroke Brother     Allergies  Allergen Reactions  . Colchicine     GI problems  . Flexeril (Cyclobenzaprine Hcl)   . Zebeta     Medication list has been reviewed and updated.  Current Outpatient Prescriptions on File Prior to Visit  Medication Sig Dispense Refill  . allopurinol (ZYLOPRIM) 100 MG tablet Take 1 tablet (100 mg total) by mouth daily.  90 tablet  3  . aspirin 81 MG tablet Take 81 mg by mouth daily.        Marland Kitchen atorvastatin (LIPITOR) 10 MG tablet Take 10 mg by mouth daily.      Marland Kitchen losartan-hydrochlorothiazide (HYZAAR) 100-12.5 MG per tablet Take 1 tablet by mouth daily.  90 tablet  3  . metoprolol succinate (TOPROL-XL) 50 MG 24 hr tablet Take 1 tablet (50 mg total) by mouth daily. Take with or immediately following a meal.  90 tablet  3  . mirtazapine (REMERON) 7.5 MG tablet Take 7.5 mg by mouth as needed.      . sitaGLIPtin (JANUVIA) 50 MG tablet Take 1 tablet (50 mg total) by  mouth daily.  90 tablet  3   No current facility-administered medications on file prior to visit.    Review of Systems:  As per HPI, otherwise negative.    Physical Examination: Filed Vitals:   05/20/13 1308  BP: 128/62  Pulse: 66  Temp: 97.6 F (36.4 C)  Resp: 16   Filed Vitals:   05/20/13 1308  Height: 5' 6.25" (1.683 m)  Weight: 150 lb 9.6 oz (68.312 kg)   Body mass index is 24.12 kg/(m^2). Ideal Body Weight: Weight in (lb) to have BMI = 25: 155.7  GEN: WDWN, NAD, Non-toxic, A & O x 3 HEENT: Atraumatic, Normocephalic. Neck supple. No masses, No LAD. Ears and Nose: No external deformity. CV: RRR, No M/G/R. No JVD. No thrill. No extra heart sounds. PULM: CTA B, no wheezes, crackles, rhonchi. No retractions. No resp. distress. No accessory muscle use. ABD: S, NT, ND, +BS. No rebound. No HSM. EXTR: No c/c/e NEURO Normal gait.  PSYCH: Normally  interactive. Conversant. Not depressed or anxious appearing.  Calm demeanor.    Assessment and Plan: Abdominal pain Labs sono  Signed,  Ellison Carwin, MD

## 2013-05-20 NOTE — Patient Instructions (Addendum)
Your U S can be done at [facility]. They will call you with the appt, hopefully will be tomorrow.      Cholecystitis Cholecystitis is an inflammation of your gallbladder. It is usually caused by a buildup of gallstones or sludge (cholelithiasis) in your gallbladder. The gallbladder stores a fluid that helps digest fats (bile). Cholecystitis is serious and needs treatment right away.  CAUSES   Gallstones. Gallstones can block the tube that leads to your gallbladder, causing bile to build up. As bile builds up, the gallbladder becomes inflamed.  Bile duct problems, such as blockage from scarring or kinking.  Tumors. Tumors can stop bile from leaving your gallbladder correctly, causing bile to build up. As bile builds up, the gallbladder becomes inflamed. SYMPTOMS   Nausea.  Vomiting.  Abdominal pain, especially in the upper right area of your abdomen.  Abdominal tenderness or bloating.  Sweating.  Chills.  Fever.  Yellowing of the skin and the whites of the eyes (jaundice). DIAGNOSIS  Your caregiver may order blood tests to look for infection or gallbladder problems. Your caregiver may also order imaging tests, such as an ultrasound or computed tomography (CT) scan. Further tests may include a hepatobiliary iminodiacetic acid (HIDA) scan. This scan allows your caregiver to see your bile move from the liver to the gallbladder and to the small intestine. TREATMENT  A hospital stay is usually necessary to lessen the inflammation of your gallbladder. You may be required to not eat or drink (fast) for a certain amount of time. You may be given medicine to treat pain or an antibiotic medicine to treat an infection. Surgery may be needed to remove your gallbladder (cholecystectomy) once the inflammation has gone down. Surgery may be needed right away if you develop complications such as death of gallbladder tissue (gangrene) or a tear (perforation) of the gallbladder.  [facility] care will depend on your treatment. In general:  If you were given antibiotics, take them as directed. Finish them even if you start to feel better.  Only take over-the-counter or prescription medicines for pain, discomfort, or fever as directed by your caregiver.  Follow a low-fat diet until you see your caregiver again.  Keep all follow-up visits as directed by your caregiver. SEEK IMMEDIATE MEDICAL CARE IF:   Your pain is increasing and not controlled by medicines.  Your pain moves to another part of your abdomen or to your back.  You have a fever.  You have nausea and vomiting. MAKE SURE YOU:  Understand these instructions.  Will watch your condition.  Will get help right away if you are not doing well or get worse. Document Released: 11/14/2005 Document Revised: 02/06/2012 Document Reviewed: 09/30/2011 [facility] Patient Information 2014 [place], [place].

## 2013-05-21 MED ORDER — AMOXICILL-CLARITHRO-LANSOPRAZ PO MISC: Freq: Two times a day (BID) | ORAL | Status: DC

## 2013-05-21 NOTE — Addendum Note (Signed)
Addended by: Roselee Culver on: 05/21/2013 01:42 PM   Modules accepted: Orders

## 2013-05-22 ENCOUNTER — Telehealth: Payer: Self-pay

## 2013-05-22 NOTE — Telephone Encounter (Signed)
Left message for him to call back. See labs.

## 2013-05-22 NOTE — Telephone Encounter (Signed)
Pt is returning someones phone call Call back number sometime after [phone]

## 2013-05-26 ENCOUNTER — Telehealth: Payer: Self-pay

## 2013-05-26 NOTE — Telephone Encounter (Signed)
Pt is wanting to know if he could take amoudium with his other medications   Best number [phone]

## 2013-05-27 ENCOUNTER — Encounter (HOSPITAL_COMMUNITY)
Admission: RE | Admit: 2013-05-27 | Discharge: 2013-05-27 | Disposition: A | Discharge status: Home / Self Care | Payer: Medicare Other | Source: Ambulatory Visit | Attending: Nephrology | Admitting: Nephrology

## 2013-05-27 MED ORDER — EPOETIN ALFA 20000 UNIT/ML IJ SOLN
INTRAMUSCULAR | Status: AC
  Filled 2013-05-27: qty 1

## 2013-05-27 MED ORDER — EPOETIN ALFA 10000 UNIT/ML IJ SOLN
20000.0000 [IU] | INTRAMUSCULAR | Status: DC
Start: 2013-05-27 — End: 2013-05-28
  Administered 2013-05-27: 20000 [IU] via SUBCUTANEOUS

## 2013-05-27 NOTE — Telephone Encounter (Signed)
Yes okay to take immodium. Left message to advise.

## 2013-05-28 ENCOUNTER — Ambulatory Visit
Admission: RE | Admit: 2013-05-28 | Discharge: 2013-05-28 | Disposition: A | Discharge status: Home / Self Care | Payer: Medicare Other | Source: Ambulatory Visit | Attending: Emergency Medicine | Admitting: Emergency Medicine

## 2013-05-28 DIAGNOSIS — R1013 Epigastric pain: Secondary | ICD-10-CM

## 2013-05-28 DIAGNOSIS — K8020 Calculus of gallbladder without cholecystitis without obstruction: Secondary | ICD-10-CM | POA: Diagnosis not present

## 2013-05-28 MED FILL — Epoetin Alfa Inj 20000 Unit/ML: INTRAMUSCULAR | Qty: 1 | Status: AC

## 2013-05-28 NOTE — Addendum Note (Signed)
Addended by: Roselee Culver on: 05/28/2013 11:18 AM   Modules accepted: Orders

## 2013-06-03 ENCOUNTER — Telehealth: Payer: Self-pay | Admitting: *Deleted

## 2013-06-03 NOTE — Telephone Encounter (Signed)
Pt notified of results of ultrasound.

## 2013-06-04 ENCOUNTER — Encounter: Payer: Self-pay | Admitting: Cardiology

## 2013-06-04 ENCOUNTER — Ambulatory Visit (INDEPENDENT_AMBULATORY_CARE_PROVIDER_SITE_OTHER): Payer: Medicare Other | Admitting: Cardiology

## 2013-06-04 VITALS — BP 126/64 | HR 72 | Ht 66.0 in | Wt 150.4 lb

## 2013-06-04 DIAGNOSIS — K521 Toxic gastroenteritis and colitis: Secondary | ICD-10-CM

## 2013-06-04 DIAGNOSIS — K8020 Calculus of gallbladder without cholecystitis without obstruction: Secondary | ICD-10-CM

## 2013-06-04 DIAGNOSIS — R197 Diarrhea, unspecified: Secondary | ICD-10-CM | POA: Diagnosis not present

## 2013-06-04 DIAGNOSIS — T50904A Poisoning by unspecified drugs, medicaments and biological substances, undetermined, initial encounter: Secondary | ICD-10-CM | POA: Diagnosis not present

## 2013-06-04 DIAGNOSIS — R7989 Other specified abnormal findings of blood chemistry: Secondary | ICD-10-CM | POA: Diagnosis not present

## 2013-06-04 DIAGNOSIS — I119 Hypertensive heart disease without heart failure: Secondary | ICD-10-CM

## 2013-06-04 DIAGNOSIS — E785 Hyperlipidemia, unspecified: Secondary | ICD-10-CM

## 2013-06-04 LAB — HEPATIC FUNCTION PANEL
ALT: 33 U/L (ref 0–53)
Albumin: 3.7 g/dL (ref 3.5–5.2)
Total Bilirubin: 0.6 mg/dL (ref 0.3–1.2)
Total Protein: 7.5 g/dL (ref 6.0–8.3)

## 2013-06-04 LAB — CBC WITH DIFFERENTIAL/PLATELET
Basophils Relative: 0.3 % (ref 0.0–3.0)
Eosinophils Relative: 4.9 % (ref 0.0–5.0)
HCT: 29.3 % — ABNORMAL LOW (ref 39.0–52.0)
Lymphs Abs: 1.8 10*3/uL (ref 0.7–4.0)
MCV: 91 fl (ref 78.0–100.0)
Monocytes Absolute: 0.7 10*3/uL (ref 0.1–1.0)
Neutro Abs: 4.5 10*3/uL (ref 1.4–7.7)
Platelets: 166 10*3/uL (ref 150.0–400.0)
WBC: 7.4 10*3/uL (ref 4.5–10.5)

## 2013-06-04 LAB — BASIC METABOLIC PANEL
BUN: 34 mg/dL — ABNORMAL HIGH (ref 6–23)
Chloride: 110 mEq/L (ref 96–112)
Potassium: 5.8 mEq/L — ABNORMAL HIGH (ref 3.5–5.1)

## 2013-06-04 NOTE — Assessment & Plan Note (Signed)
The patient has been on a Prevpac he has developed diarrhea each time he takes it.  I have advised him to leave off the Prevpac.

## 2013-06-04 NOTE — Progress Notes (Signed)
[name] Date of Birth:  1925/07/01 [facility] [address] [place], [place]  [address] [phone]         Fax   [phone]  History of Present Illness: This pleasant 79 year old gentleman is seen for a scheduled followup office visit. He has a past history of essential hypertension. He also has a history of dyslipidemia, diabetes, and mild renal insufficiency. Also has a history of hyperuricemia. He has anemia secondary to chronic renal disease and receives ejections of Procrit about every 3 weeks depending on his hemoglobin level. Dr. Posey Pronto is his nephrologist.  Since we last saw him in the office he has been very sick.  Several weeks ago he developed severe abdominal discomfort and was seen at urgent care Center.  Subsequent ultrasound of the abdomen showed multiple small gallstones.  There was also some evidence of dilatation of the  Hepatic bile ducts from the left lobe of the liver his liver function studies were elevated and his renal function appeared to be worse possibly secondary to nausea and vomiting at the time.  He has not been any chest pain or angina.  He has been told that he may need to have gallbladder surgery.  He is somewhat hesitant because his older sister died on the table during cholecystectomy.   Current Outpatient Prescriptions  Medication Sig Dispense Refill  . allopurinol (ZYLOPRIM) 100 MG tablet Take 1 tablet (100 mg total) by mouth daily.  90 tablet  3  . aspirin 81 MG tablet Take 81 mg by mouth daily.        Marland Kitchen losartan-hydrochlorothiazide (HYZAAR) 100-12.5 MG per tablet Take 1 tablet by mouth daily.  90 tablet  3  . metoprolol succinate (TOPROL-XL) 50 MG 24 hr tablet Take 1 tablet (50 mg total) by mouth daily. Take with or immediately following a meal.  90 tablet  3  . mirtazapine (REMERON) 7.5 MG tablet Take 7.5 mg by mouth as needed.      . sitaGLIPtin (JANUVIA) 50 MG tablet Take 1 tablet (50 mg total) by mouth daily.  90 tablet   3   No current facility-administered medications for this visit.    Allergies  Allergen Reactions  . Colchicine     GI problems  . Flexeril (Cyclobenzaprine Hcl)   . Zebeta     Patient Active Problem List   Diagnosis Date Noted  . Cholelithiasis 06/04/2013  . Diarrhea due to drug 06/04/2013  . Elevated LFTs 06/04/2013  . Benign hypertensive heart disease without heart failure 09/07/2011  . Type II or unspecified type diabetes mellitus without mention of complication, uncontrolled 09/07/2011  . Dyslipidemia 09/07/2011  . Chronic renal insufficiency, stage III (moderate) 09/07/2011    History  Smoking status  . Former Smoker -- 1.00 packs/day for 45 years  . Types: Cigarettes  . Quit date: 09/07/1991  Smokeless tobacco  . Never Used    History  Alcohol Use No    Family History  Problem Relation Age of Onset  . Stroke Father   . Stroke Mother   . Stroke Sister   . Stroke Brother   . Stroke Brother   . Stroke Brother     Review of Systems: Constitutional: no fever chills diaphoresis or fatigue or change in weight.  Head and neck: no hearing loss, no epistaxis, no photophobia or visual disturbance. Respiratory: No cough, shortness of breath or wheezing. Cardiovascular: No chest pain peripheral edema, palpitations. Gastrointestinal: No abdominal distention, no abdominal pain,  no change in bowel habits hematochezia or melena. Genitourinary: No dysuria, no frequency, no urgency, no nocturia. Musculoskeletal:No arthralgias, no back pain, no gait disturbance or myalgias. Neurological: No dizziness, no headaches, no numbness, no seizures, no syncope, no weakness, no tremors. Hematologic: No lymphadenopathy, no easy bruising. Psychiatric: No confusion, no hallucinations, no sleep disturbance.    Physical Exam: Filed Vitals:   06/04/13 0947  BP: 126/64  Pulse: 72   the general appearance reveals a thin elderly gentleman in no acute distress.  Despite his recent  severe illness his weight is down only 1 pound since last visit.The head and neck exam reveals pupils equal and reactive.  Extraocular movements are full.  There is no scleral icterus.  The mouth and pharynx are normal.  The neck is supple.  The carotids reveal no bruits.  The jugular venous pressure is normal.  The  thyroid is not enlarged.  There is no lymphadenopathy.  The chest is clear to percussion and auscultation.  There are no rales or rhonchi.  Expansion of the chest is symmetrical.  The precordium is quiet.  The first heart sound is normal.  The second heart sound is physiologically split.  There is no murmur gallop rub or click.  There is no abnormal lift or heave.  The abdomen is soft and nontender.  The bowel sounds are normal.  The liver and spleen are not enlarged.  There are no abdominal masses.  There are no abdominal bruits.  Extremities reveal good pedal pulses.  There is no phlebitis or edema.  There is no cyanosis or clubbing.  Strength is normal and symmetrical in all extremities.  There is no lateralizing weakness.  There are no sensory deficits.  The skin is warm and dry.  There is no rash.  EKG today shows normal sinus rhythm with sinus arrhythmia and is otherwise within normal limits.  No ischemic changes.   Assessment / Plan: Continue same cardiac meds except stop Lipitor temporarily.  Get followup lab work today including CBC and comprehensive metabolic panel.  Refer to Dr. Acquanetta Sit for further GI workup and then eventually he will need referral for surgery.

## 2013-06-04 NOTE — Assessment & Plan Note (Signed)
In view of his elevated transaminase levels we will leave off his Lipitor temporarily.  We are rechecking liver function studies today

## 2013-06-04 NOTE — Patient Instructions (Addendum)
Will obtain labs today and call you with the results ([email or web address])  STOP YOUR LIPITOR   STOP YOUR [place]  July 31 at 9:15 with Dr Penelope Coop [phone])  YOU NEED TO BE ON A LOW FAT DIET  Your physician recommends that you schedule a follow-up appointment in: 2 MONTH OV

## 2013-06-04 NOTE — Assessment & Plan Note (Signed)
The patient has cholelithiasis.  I have recommended that he see his gastroenterologist for further workup of his gallbladder and his hepatic issues.  He has seen Dr. Acquanetta Sit of [facility] gastroenterology in the past.  He will eventually need cholecystectomy.

## 2013-06-05 ENCOUNTER — Ambulatory Visit (INDEPENDENT_AMBULATORY_CARE_PROVIDER_SITE_OTHER): Payer: 59 | Admitting: General Surgery

## 2013-06-05 ENCOUNTER — Telehealth: Payer: Self-pay | Admitting: *Deleted

## 2013-06-05 NOTE — Telephone Encounter (Signed)
Message copied by Earvin Hansen on Wed Jun 05, 2013  6:09 PM ------      Message from: Darlin Coco      Created: Wed Jun 05, 2013 10:07 AM       Please report to patient.  The liver tests have returned to normal.  He should start back on his Lipitor now.      His kidney function is worse with creatinine 2.8 and his potassium level is too high 5.8.  He needs to avoid high potassium foods and drink plenty of water and I would like him to see his nephrologist again soon for followup.  The Hyzaar may also be contributing to his elevated potassium.  I want him to decrease his Hyzaar to just one half tablet daily.      We should recheck a basal metabolic panel in  one week.      His white count is normal and his hemoglobin is down slightly at 9.7 ------

## 2013-06-05 NOTE — Telephone Encounter (Signed)
Advised patient, medication changes reviewed.

## 2013-06-05 NOTE — Progress Notes (Signed)
Quick Note:  Please report to patient. The liver tests have returned to normal. He should start back on his Lipitor now. His kidney function is worse with creatinine 2.8 and his potassium level is too high 5.8. He needs to avoid high potassium foods and drink plenty of water and I would like him to see his nephrologist again soon for followup. The Hyzaar may also be contributing to his elevated potassium. I want him to decrease his Hyzaar to just one half tablet daily. We should recheck a basal metabolic panel in one week. His white count is normal and his hemoglobin is down slightly at 9.7 ______

## 2013-06-12 ENCOUNTER — Ambulatory Visit (INDEPENDENT_AMBULATORY_CARE_PROVIDER_SITE_OTHER): Payer: Medicare Other | Admitting: Cardiology

## 2013-06-12 DIAGNOSIS — E785 Hyperlipidemia, unspecified: Secondary | ICD-10-CM

## 2013-06-12 DIAGNOSIS — I119 Hypertensive heart disease without heart failure: Secondary | ICD-10-CM | POA: Diagnosis not present

## 2013-06-12 LAB — BASIC METABOLIC PANEL
GFR: 27.84 mL/min — ABNORMAL LOW (ref >60.00)
Potassium: 4.7 mEq/L (ref 3.5–5.1)
Sodium: 134 mEq/L — ABNORMAL LOW (ref 135–145)

## 2013-06-13 ENCOUNTER — Telehealth: Payer: Self-pay | Admitting: Cardiology

## 2013-06-13 NOTE — Telephone Encounter (Signed)
Advised patient of lab results  

## 2013-06-13 NOTE — Telephone Encounter (Signed)
Message copied by Earvin Hansen on Thu Jun 13, 2013  6:12 PM ------      Message from: Darlin Coco      Created: Thu Jun 13, 2013  8:54 AM       Blood work is better. K is better. CSD. ------

## 2013-06-13 NOTE — Telephone Encounter (Signed)
New problem ° ° °Pt returning your call. °

## 2013-06-17 ENCOUNTER — Encounter (HOSPITAL_COMMUNITY)
Admission: RE | Admit: 2013-06-17 | Discharge: 2013-06-17 | Disposition: A | Discharge status: Home / Self Care | Payer: Medicare Other | Source: Ambulatory Visit | Attending: Nephrology | Admitting: Nephrology

## 2013-06-17 DIAGNOSIS — D638 Anemia in other chronic diseases classified elsewhere: Secondary | ICD-10-CM | POA: Diagnosis not present

## 2013-06-17 DIAGNOSIS — N183 Chronic kidney disease, stage 3 unspecified: Secondary | ICD-10-CM | POA: Insufficient documentation

## 2013-06-17 LAB — IRON AND TIBC
Iron: 87 ug/dL (ref 42–135)
Saturation Ratios: 27 % (ref 20–55)
TIBC: 324 ug/dL (ref 215–435)
UIBC: 237 ug/dL (ref 125–400)

## 2013-06-17 LAB — RENAL FUNCTION PANEL
Albumin: 3.5 g/dL (ref 3.5–5.2)
BUN: 33 mg/dL — ABNORMAL HIGH (ref 6–23)
Creatinine, Ser: 2.27 mg/dL — ABNORMAL HIGH (ref 0.50–1.35)
Glucose, Bld: 203 mg/dL — ABNORMAL HIGH (ref 70–99)
Phosphorus: 2.4 mg/dL (ref 2.3–4.6)
Potassium: 4.2 mEq/L (ref 3.5–5.1)

## 2013-06-17 LAB — POCT HEMOGLOBIN-HEMACUE: Hemoglobin: 9.9 g/dL — ABNORMAL LOW (ref 13.0–17.0)

## 2013-06-17 MED ORDER — EPOETIN ALFA 10000 UNIT/ML IJ SOLN: 20000.0000 [IU] | INTRAMUSCULAR | Status: DC

## 2013-06-17 MED ORDER — EPOETIN ALFA 20000 UNIT/ML IJ SOLN
INTRAMUSCULAR | Status: AC
  Administered 2013-06-17: 20000 [IU] via SUBCUTANEOUS
  Filled 2013-06-17: qty 1

## 2013-07-06 ENCOUNTER — Other Ambulatory Visit: Payer: Self-pay | Admitting: Cardiology

## 2013-07-08 ENCOUNTER — Encounter (HOSPITAL_COMMUNITY)
Admission: RE | Admit: 2013-07-08 | Discharge: 2013-07-08 | Disposition: A | Discharge status: Home / Self Care | Payer: Medicare Other | Source: Ambulatory Visit | Attending: Nephrology | Admitting: Nephrology

## 2013-07-08 DIAGNOSIS — N183 Chronic kidney disease, stage 3 unspecified: Secondary | ICD-10-CM | POA: Insufficient documentation

## 2013-07-08 DIAGNOSIS — D638 Anemia in other chronic diseases classified elsewhere: Secondary | ICD-10-CM | POA: Diagnosis not present

## 2013-07-08 MED ORDER — EPOETIN ALFA 20000 UNIT/ML IJ SOLN
INTRAMUSCULAR | Status: AC
  Administered 2013-07-08: 20000 [IU] via SUBCUTANEOUS
  Filled 2013-07-08: qty 1

## 2013-07-08 MED ORDER — EPOETIN ALFA 10000 UNIT/ML IJ SOLN: 20000.0000 [IU] | INTRAMUSCULAR | Status: DC

## 2013-07-16 ENCOUNTER — Other Ambulatory Visit: Payer: Self-pay | Admitting: Gastroenterology

## 2013-07-16 DIAGNOSIS — K8020 Calculus of gallbladder without cholecystitis without obstruction: Secondary | ICD-10-CM | POA: Diagnosis not present

## 2013-07-23 ENCOUNTER — Ambulatory Visit
Admission: RE | Admit: 2013-07-23 | Discharge: 2013-07-23 | Disposition: A | Discharge status: Home / Self Care | Payer: Medicare Other | Source: Ambulatory Visit | Attending: Gastroenterology | Admitting: Gastroenterology

## 2013-07-23 DIAGNOSIS — K8020 Calculus of gallbladder without cholecystitis without obstruction: Secondary | ICD-10-CM

## 2013-07-23 DIAGNOSIS — K228 Other specified diseases of esophagus: Secondary | ICD-10-CM | POA: Diagnosis not present

## 2013-07-23 DIAGNOSIS — R935 Abnormal findings on diagnostic imaging of other abdominal regions, including retroperitoneum: Secondary | ICD-10-CM | POA: Diagnosis not present

## 2013-07-23 DIAGNOSIS — K7689 Other specified diseases of liver: Secondary | ICD-10-CM | POA: Diagnosis not present

## 2013-07-26 ENCOUNTER — Other Ambulatory Visit (HOSPITAL_COMMUNITY): Payer: Self-pay | Admitting: *Deleted

## 2013-07-30 ENCOUNTER — Encounter (HOSPITAL_COMMUNITY)
Admission: RE | Admit: 2013-07-30 | Discharge: 2013-07-30 | Disposition: A | Discharge status: Home / Self Care | Payer: Medicare Other | Source: Ambulatory Visit | Attending: Nephrology | Admitting: Nephrology

## 2013-07-30 DIAGNOSIS — D638 Anemia in other chronic diseases classified elsewhere: Secondary | ICD-10-CM | POA: Diagnosis not present

## 2013-07-30 DIAGNOSIS — N183 Chronic kidney disease, stage 3 unspecified: Secondary | ICD-10-CM | POA: Diagnosis not present

## 2013-07-30 LAB — POCT HEMOGLOBIN-HEMACUE: Hemoglobin: 9.9 g/dL — ABNORMAL LOW (ref 13.0–17.0)

## 2013-07-30 LAB — RENAL FUNCTION PANEL
BUN: 34 mg/dL — ABNORMAL HIGH (ref 6–23)
GFR calc non Af Amer: 27 mL/min — ABNORMAL LOW (ref >90)
Glucose, Bld: 215 mg/dL — ABNORMAL HIGH (ref 70–99)
Potassium: 4.3 mEq/L (ref 3.5–5.1)
Sodium: 136 mEq/L (ref 135–145)

## 2013-07-30 LAB — IRON AND TIBC
Saturation Ratios: 28 % (ref 20–55)
TIBC: 305 ug/dL (ref 215–435)
UIBC: 220 ug/dL (ref 125–400)

## 2013-07-30 MED ORDER — EPOETIN ALFA 20000 UNIT/ML IJ SOLN
INTRAMUSCULAR | Status: AC
  Administered 2013-07-30: 20000 [IU] via SUBCUTANEOUS
  Filled 2013-07-30: qty 1

## 2013-07-30 MED ORDER — EPOETIN ALFA 10000 UNIT/ML IJ SOLN: 20000.0000 [IU] | INTRAMUSCULAR | Status: DC

## 2013-08-05 ENCOUNTER — Ambulatory Visit (INDEPENDENT_AMBULATORY_CARE_PROVIDER_SITE_OTHER): Payer: Medicare Other | Admitting: Cardiology

## 2013-08-05 ENCOUNTER — Encounter: Payer: Self-pay | Admitting: Cardiology

## 2013-08-05 VITALS — BP 150/62 | HR 60 | Ht 68.0 in | Wt 149.0 lb

## 2013-08-05 DIAGNOSIS — K8020 Calculus of gallbladder without cholecystitis without obstruction: Secondary | ICD-10-CM | POA: Diagnosis not present

## 2013-08-05 DIAGNOSIS — I119 Hypertensive heart disease without heart failure: Secondary | ICD-10-CM

## 2013-08-05 NOTE — Assessment & Plan Note (Signed)
The patient will be meeting with his gastroenterologist Dr. Penelope Coop to discuss surgical options.

## 2013-08-05 NOTE — Assessment & Plan Note (Signed)
The patient is not having any hypoglycemic episodes 

## 2013-08-05 NOTE — Patient Instructions (Addendum)
Your physician recommends that you continue on your current medications as directed. Please refer to the Current Medication list given to you today.  Your physician wants you to follow-up in: 4 months with fasting labs ([id number]) You will receive a reminder letter in the mail two months in advance. If you don't receive a letter, please call our office to schedule the follow-up appointment.  

## 2013-08-05 NOTE — Assessment & Plan Note (Signed)
The patient has not been having any symptoms of congestive heart failure.  No angina pectoris.  No dizziness or syncope.Marland Kitchen

## 2013-08-05 NOTE — Progress Notes (Signed)
[name] Date of Birth:  10-Nov-1925 [facility] [address] [place], [place]  [address] [phone]         Fax   [phone]  History of Present Illness: This pleasant 79 year old gentleman is seen for a scheduled followup office visit. He has a past history of essential hypertension. He also has a history of dyslipidemia, diabetes, and mild renal insufficiency. Also has a history of hyperuricemia. He has anemia secondary to chronic renal disease and receives ejections of Procrit about every 3 weeks depending on his hemoglobin level. Dr. Posey Pronto is his nephrologist.  Over the course of the summer he developed signs and symptoms of cholecystitis with abdominal pain and his subsequent ultrasound showed multiple small gallstones.  There was also some evidence of dilatation of the hepatic bile ducts from the left lobe of the liver.  He had elevated liver function studies and his statin dose was reduced he is contemplating surgery.  He is somewhat hesitant because his older sister died on the table during cholecystectomy.  Current Outpatient Prescriptions  Medication Sig Dispense Refill  . allopurinol (ZYLOPRIM) 100 MG tablet Take 1 tablet (100 mg total) by mouth daily.  90 tablet  3  . aspirin 81 MG tablet Take 81 mg by mouth daily.        Marland Kitchen atorvastatin (LIPITOR) 10 MG tablet TAKE 1 TABLET DAILY  90 tablet  3  . losartan-hydrochlorothiazide (HYZAAR) 100-12.5 MG per tablet Take 1 tablet by mouth as directed. 1/2 tablet daily      . metoprolol succinate (TOPROL-XL) 50 MG 24 hr tablet Take 1 tablet (50 mg total) by mouth daily. Take with or immediately following a meal.  90 tablet  3  . mirtazapine (REMERON) 7.5 MG tablet Take 7.5 mg by mouth as needed.      . sitaGLIPtin (JANUVIA) 50 MG tablet Take 1 tablet (50 mg total) by mouth daily.  90 tablet  3   No current facility-administered medications for this visit.    Allergies  Allergen Reactions  . Colchicine    GI problems  . Flexeril [Cyclobenzaprine Hcl]   . Zebeta     Patient Active Problem List   Diagnosis Date Noted  . Cholelithiasis 06/04/2013  . Diarrhea due to drug 06/04/2013  . Elevated LFTs 06/04/2013  . Benign hypertensive heart disease without heart failure 09/07/2011  . Type II or unspecified type diabetes mellitus without mention of complication, uncontrolled 09/07/2011  . Dyslipidemia 09/07/2011  . Chronic renal insufficiency, stage III (moderate) 09/07/2011    History  Smoking status  . Former Smoker -- 1.00 packs/day for 45 years  . Types: Cigarettes  . Quit date: 09/07/1991  Smokeless tobacco  . Never Used    History  Alcohol Use No    Family History  Problem Relation Age of Onset  . Stroke Father   . Stroke Mother   . Stroke Sister   . Stroke Brother   . Stroke Brother   . Stroke Brother     Review of Systems: Constitutional: no fever chills diaphoresis or fatigue or change in weight.  Head and neck: no hearing loss, no epistaxis, no photophobia or visual disturbance. Respiratory: No cough, shortness of breath or wheezing. Cardiovascular: No chest pain peripheral edema, palpitations. Gastrointestinal: No abdominal distention, no abdominal pain, no change in bowel habits hematochezia or melena. Genitourinary: No dysuria, no frequency, no urgency, no nocturia. Musculoskeletal:No arthralgias, no back pain, no gait disturbance or myalgias. Neurological: No  dizziness, no headaches, no numbness, no seizures, no syncope, no weakness, no tremors. Hematologic: No lymphadenopathy, no easy bruising. Psychiatric: No confusion, no hallucinations, no sleep disturbance.    Physical Exam: Filed Vitals:   08/05/13 1210  BP: 150/62  Pulse: 60   the general appearance reveals a well-developed elderly gentleman in no distress.The head and neck exam reveals pupils equal and reactive.  Extraocular movements are full.  There is no scleral icterus.  The mouth and pharynx  are normal.  The neck is supple.  The carotids reveal no bruits.  The jugular venous pressure is normal.  The  thyroid is not enlarged.  There is no lymphadenopathy.  The chest is clear to percussion and auscultation.  There are no rales or rhonchi.  Expansion of the chest is symmetrical.  The precordium is quiet.  The first heart sound is normal.  The second heart sound is physiologically split.  There is no murmur gallop rub or click.  There is no abnormal lift or heave.  The abdomen is soft and nontender.  The bowel sounds are normal.  The liver and spleen are not enlarged.  There are no abdominal masses.  There are no abdominal bruits.  Extremities reveal good pedal pulses.  There is no phlebitis or edema.  There is no cyanosis or clubbing.  Strength is normal and symmetrical in all extremities.  There is no lateralizing weakness.  There are no sensory deficits.  The skin is warm and dry.  There is no rash.     Assessment / Plan: From a cardiac standpoint the patient is doing well.  He'll continue on same medication and be rechecked in 4 months for followup office visit lipid panel hepatic function panel and basal metabolic panel.

## 2013-08-13 DIAGNOSIS — R932 Abnormal findings on diagnostic imaging of liver and biliary tract: Secondary | ICD-10-CM | POA: Diagnosis not present

## 2013-08-19 ENCOUNTER — Other Ambulatory Visit (HOSPITAL_COMMUNITY): Payer: Self-pay | Admitting: *Deleted

## 2013-08-20 ENCOUNTER — Encounter (HOSPITAL_COMMUNITY)
Admission: RE | Admit: 2013-08-20 | Discharge: 2013-08-20 | Disposition: A | Discharge status: Home / Self Care | Payer: Medicare Other | Source: Ambulatory Visit | Attending: Nephrology | Admitting: Nephrology

## 2013-08-20 LAB — PROTEIN / CREATININE RATIO, URINE: Creatinine, Urine: 28.29 mg/dL

## 2013-08-20 LAB — HEMOGLOBIN AND HEMATOCRIT, BLOOD: HCT: 31.1 % — ABNORMAL LOW (ref 39.0–52.0)

## 2013-08-20 LAB — MAGNESIUM: Magnesium: 2 mg/dL (ref 1.5–2.5)

## 2013-08-20 MED ORDER — EPOETIN ALFA 20000 UNIT/ML IJ SOLN
INTRAMUSCULAR | Status: AC
  Administered 2013-08-20: 20000 [IU] via SUBCUTANEOUS
  Filled 2013-08-20: qty 1

## 2013-08-20 MED ORDER — EPOETIN ALFA 10000 UNIT/ML IJ SOLN: 20000.0000 [IU] | INTRAMUSCULAR | Status: DC

## 2013-08-21 LAB — VITAMIN D 25 HYDROXY (VIT D DEFICIENCY, FRACTURES): Vit D, 25-Hydroxy: 36 ng/mL (ref 30–89)

## 2013-08-23 DIAGNOSIS — N2581 Secondary hyperparathyroidism of renal origin: Secondary | ICD-10-CM | POA: Diagnosis not present

## 2013-08-23 DIAGNOSIS — N184 Chronic kidney disease, stage 4 (severe): Secondary | ICD-10-CM | POA: Diagnosis not present

## 2013-08-23 DIAGNOSIS — D649 Anemia, unspecified: Secondary | ICD-10-CM | POA: Diagnosis not present

## 2013-08-23 DIAGNOSIS — I129 Hypertensive chronic kidney disease with stage 1 through stage 4 chronic kidney disease, or unspecified chronic kidney disease: Secondary | ICD-10-CM | POA: Diagnosis not present

## 2013-08-28 ENCOUNTER — Ambulatory Visit (INDEPENDENT_AMBULATORY_CARE_PROVIDER_SITE_OTHER): Payer: Medicare Other | Admitting: Cardiology

## 2013-08-28 ENCOUNTER — Encounter: Payer: Self-pay | Admitting: Cardiology

## 2013-08-28 VITALS — BP 158/70 | HR 68 | Ht 68.0 in | Wt 152.0 lb

## 2013-08-28 DIAGNOSIS — K8050 Calculus of bile duct without cholangitis or cholecystitis without obstruction: Secondary | ICD-10-CM | POA: Insufficient documentation

## 2013-08-28 DIAGNOSIS — I119 Hypertensive heart disease without heart failure: Secondary | ICD-10-CM

## 2013-08-28 NOTE — Patient Instructions (Addendum)
Your physician recommends that you continue on your current medications as directed. Please refer to the Current Medication list given to you today.  Will see you back early next year, you will receive a letter to call office for appointment. If you do not receive call us

## 2013-08-28 NOTE — Assessment & Plan Note (Signed)
Blood pressure has been remaining stable on current therapy.  No recent chest pain.  No shortness of breath.  His gastroenterologists have suggested that he hold his losartan for 3 days prior to the procedure to avoid hypotension and this would be reasonable.  He only takes a half tablet daily.

## 2013-08-28 NOTE — Assessment & Plan Note (Signed)
The patient has had some intermittent nausea which he ascribes to his gallbladder condition.  He has not had any recent severe pain.  I have told him that I think that the ERCP would be safe enough for him and that he should proceed with the procedure as suggested by his gastroenterologists.  Dr. Watt Climes will be doing the procedure.

## 2013-08-28 NOTE — Progress Notes (Signed)
Gerald Jacobs Date of Birth:  October 21, 1925 [facility] [address] [place], [place]  [address] [phone]         Fax   [phone]  History of Present Illness: This pleasant 79 year old gentleman is seen for a work in office visit.  He wanted to discuss with me about his gallstone and common duct obstruction problem.  His gastroenterologists have suggested ERCP and he wanted my opinion about that. He has a past history of essential hypertension. He also has a history of dyslipidemia, diabetes, and mild renal insufficiency. Also has a history of hyperuricemia. He has anemia secondary to chronic renal disease and receives ejections of Procrit about every 3 weeks depending on his hemoglobin level. Dr. Posey Pronto is his nephrologist.  Over the course of the summer he developed signs and symptoms of cholecystitis with abdominal pain and his subsequent MRI has shown a 7 mm stone in one of the upper ducts. Current Outpatient Prescriptions  Medication Sig Dispense Refill  . allopurinol (ZYLOPRIM) 100 MG tablet Take 1 tablet (100 mg total) by mouth daily.  90 tablet  3  . aspirin 81 MG tablet Take 81 mg by mouth daily.        Marland Kitchen atorvastatin (LIPITOR) 10 MG tablet TAKE 1 TABLET DAILY  90 tablet  3  . losartan-hydrochlorothiazide (HYZAAR) 100-12.5 MG per tablet Take 1 tablet by mouth as directed. 1/2 tablet daily      . metoprolol succinate (TOPROL-XL) 50 MG 24 hr tablet Take 1 tablet (50 mg total) by mouth daily. Take with or immediately following a meal.  90 tablet  3  . mirtazapine (REMERON) 7.5 MG tablet Take 7.5 mg by mouth as needed.      . sitaGLIPtin (JANUVIA) 50 MG tablet Take 1 tablet (50 mg total) by mouth daily.  90 tablet  3   No current facility-administered medications for this visit.    Allergies  Allergen Reactions  . Colchicine     GI problems  . Flexeril [Cyclobenzaprine Hcl]   . Zebeta     Patient Active Problem List   Diagnosis Date Noted  .  Choledocholithiasis 08/28/2013  . Cholelithiasis 06/04/2013  . Diarrhea due to drug 06/04/2013  . Elevated LFTs 06/04/2013  . Benign hypertensive heart disease without heart failure 09/07/2011  . Type II or unspecified type diabetes mellitus without mention of complication, uncontrolled 09/07/2011  . Dyslipidemia 09/07/2011  . Chronic renal insufficiency, stage III (moderate) 09/07/2011    History  Smoking status  . Former Smoker -- 1.00 packs/day for 45 years  . Types: Cigarettes  . Quit date: 09/07/1991  Smokeless tobacco  . Never Used    History  Alcohol Use No    Family History  Problem Relation Age of Onset  . Stroke Father   . Stroke Mother   . Stroke Sister   . Stroke Brother   . Stroke Brother   . Stroke Brother     Review of Systems: Constitutional: no fever chills diaphoresis or fatigue or change in weight.  Head and neck: no hearing loss, no epistaxis, no photophobia or visual disturbance. Respiratory: No cough, shortness of breath or wheezing. Cardiovascular: No chest pain peripheral edema, palpitations. Gastrointestinal: No abdominal distention, no abdominal pain, no change in bowel habits hematochezia or melena. Genitourinary: No dysuria, no frequency, no urgency, no nocturia. Musculoskeletal:No arthralgias, no back pain, no gait disturbance or myalgias. Neurological: No dizziness, no headaches, no numbness, no seizures, no syncope, no weakness,  no tremors. Hematologic: No lymphadenopathy, no easy bruising. Psychiatric: No confusion, no hallucinations, no sleep disturbance.    Physical Exam: Filed Vitals:   08/28/13 0946  BP: 158/70  Pulse: 68   the general appearance reveals a well-developed elderly gentleman in no distress.The head and neck exam reveals pupils equal and reactive.  Extraocular movements are full.  There is no scleral icterus.  The mouth and pharynx are normal.  The neck is supple.  The carotids reveal no bruits.  The jugular venous  pressure is normal.  The  thyroid is not enlarged.  There is no lymphadenopathy.  The chest is clear to percussion and auscultation.  There are no rales or rhonchi.  Expansion of the chest is symmetrical.  The precordium is quiet.  The first heart sound is normal.  The second heart sound is physiologically split.  There is no murmur gallop rub or click.  There is no abnormal lift or heave.  The abdomen is soft and nontender.  The bowel sounds are normal.  The liver and spleen are not enlarged.  There are no abdominal masses.  There are no abdominal bruits.  Extremities reveal good pedal pulses.  There is no phlebitis or edema.  There is no cyanosis or clubbing.  Strength is normal and symmetrical in all extremities.  There is no lateralizing weakness.  There are no sensory deficits.  The skin is warm and dry.  There is no rash.     Assessment / Plan: From a cardiac standpoint the patient is doing well.  Continue same medication.  I recommended that he proceed with the ERCP.

## 2013-09-09 DIAGNOSIS — Z85828 Personal history of other malignant neoplasm of skin: Secondary | ICD-10-CM | POA: Diagnosis not present

## 2013-09-09 DIAGNOSIS — Z85820 Personal history of malignant melanoma of skin: Secondary | ICD-10-CM | POA: Diagnosis not present

## 2013-09-09 DIAGNOSIS — L821 Other seborrheic keratosis: Secondary | ICD-10-CM | POA: Diagnosis not present

## 2013-09-09 DIAGNOSIS — D1801 Hemangioma of skin and subcutaneous tissue: Secondary | ICD-10-CM | POA: Diagnosis not present

## 2013-09-09 DIAGNOSIS — L570 Actinic keratosis: Secondary | ICD-10-CM | POA: Diagnosis not present

## 2013-09-10 ENCOUNTER — Encounter (HOSPITAL_COMMUNITY)
Admission: RE | Admit: 2013-09-10 | Discharge: 2013-09-10 | Disposition: A | Discharge status: Home / Self Care | Payer: Medicare Other | Source: Ambulatory Visit | Attending: Nephrology | Admitting: Nephrology

## 2013-09-10 DIAGNOSIS — N183 Chronic kidney disease, stage 3 unspecified: Secondary | ICD-10-CM | POA: Insufficient documentation

## 2013-09-10 DIAGNOSIS — D638 Anemia in other chronic diseases classified elsewhere: Secondary | ICD-10-CM | POA: Insufficient documentation

## 2013-09-10 LAB — RENAL FUNCTION PANEL
Albumin: 3.8 g/dL (ref 3.5–5.2)
Calcium: 8.9 mg/dL (ref 8.4–10.5)
Chloride: 105 mEq/L (ref 96–112)
Creatinine, Ser: 2.15 mg/dL — ABNORMAL HIGH (ref 0.50–1.35)
GFR calc Af Amer: 31 mL/min — ABNORMAL LOW (ref >90)
GFR calc non Af Amer: 26 mL/min — ABNORMAL LOW (ref >90)
Sodium: 140 mEq/L (ref 135–145)

## 2013-09-10 LAB — FERRITIN: Ferritin: 18 ng/mL — ABNORMAL LOW (ref 22–322)

## 2013-09-10 LAB — IRON AND TIBC
Iron: 76 ug/dL (ref 42–135)
UIBC: 267 ug/dL (ref 125–400)

## 2013-09-10 MED ORDER — EPOETIN ALFA 10000 UNIT/ML IJ SOLN
20000.0000 [IU] | INTRAMUSCULAR | Status: DC
  Administered 2013-09-10: 20000 [IU] via SUBCUTANEOUS

## 2013-09-10 MED ORDER — EPOETIN ALFA 20000 UNIT/ML IJ SOLN
INTRAMUSCULAR | Status: AC
  Filled 2013-09-10: qty 1

## 2013-09-13 DIAGNOSIS — Z23 Encounter for immunization: Secondary | ICD-10-CM | POA: Diagnosis not present

## 2013-09-24 ENCOUNTER — Encounter (HOSPITAL_COMMUNITY): Payer: Medicare Other

## 2013-09-30 ENCOUNTER — Encounter (HOSPITAL_COMMUNITY): Payer: Self-pay | Admitting: *Deleted

## 2013-09-30 DIAGNOSIS — R932 Abnormal findings on diagnostic imaging of liver and biliary tract: Secondary | ICD-10-CM | POA: Diagnosis not present

## 2013-10-01 ENCOUNTER — Encounter (HOSPITAL_COMMUNITY)
Admission: RE | Admit: 2013-10-01 | Discharge: 2013-10-01 | Disposition: A | Discharge status: Home / Self Care | Payer: Medicare Other | Source: Ambulatory Visit | Attending: Nephrology | Admitting: Nephrology

## 2013-10-01 ENCOUNTER — Encounter (HOSPITAL_COMMUNITY): Payer: Self-pay | Admitting: Pharmacy Technician

## 2013-10-01 DIAGNOSIS — N183 Chronic kidney disease, stage 3 unspecified: Secondary | ICD-10-CM | POA: Insufficient documentation

## 2013-10-01 DIAGNOSIS — D638 Anemia in other chronic diseases classified elsewhere: Secondary | ICD-10-CM | POA: Diagnosis not present

## 2013-10-01 LAB — POCT HEMOGLOBIN-HEMACUE: Hemoglobin: 9.8 g/dL — ABNORMAL LOW (ref 13.0–17.0)

## 2013-10-01 MED ORDER — EPOETIN ALFA 10000 UNIT/ML IJ SOLN: 20000.0000 [IU] | INTRAMUSCULAR | Status: DC

## 2013-10-01 MED ORDER — EPOETIN ALFA 20000 UNIT/ML IJ SOLN
INTRAMUSCULAR | Status: AC
  Administered 2013-10-01: 09:00:00 20000 [IU] via SUBCUTANEOUS
  Filled 2013-10-01: qty 1

## 2013-10-11 ENCOUNTER — Other Ambulatory Visit: Payer: Self-pay | Admitting: Gastroenterology

## 2013-10-11 NOTE — Addendum Note (Signed)
Addended by: Ilma Achee on: 10/11/2013 01:47 PM   Modules accepted: Orders  

## 2013-10-15 ENCOUNTER — Ambulatory Visit (HOSPITAL_COMMUNITY)
Admission: RE | Admit: 2013-10-15 | Discharge: 2013-10-15 | Disposition: A | Discharge status: Home / Self Care | Payer: Medicare Other | Source: Ambulatory Visit | Attending: Gastroenterology | Admitting: Gastroenterology

## 2013-10-15 ENCOUNTER — Emergency Department (HOSPITAL_COMMUNITY)
Admission: EM | Admit: 2013-10-15 | Discharge: 2013-10-16 | Disposition: A | Discharge status: Home / Self Care | Payer: Medicare Other | Attending: Emergency Medicine | Admitting: Emergency Medicine

## 2013-10-15 ENCOUNTER — Encounter (HOSPITAL_COMMUNITY): Payer: Self-pay

## 2013-10-15 ENCOUNTER — Encounter (HOSPITAL_COMMUNITY): Payer: Self-pay | Admitting: Emergency Medicine

## 2013-10-15 ENCOUNTER — Ambulatory Visit (HOSPITAL_COMMUNITY): Payer: Medicare Other | Admitting: Anesthesiology

## 2013-10-15 ENCOUNTER — Ambulatory Visit (HOSPITAL_COMMUNITY): Payer: Medicare Other

## 2013-10-15 ENCOUNTER — Encounter (HOSPITAL_COMMUNITY)
Admission: RE | Disposition: A | Discharge status: Home / Self Care | Payer: Self-pay | Source: Ambulatory Visit | Attending: Gastroenterology

## 2013-10-15 ENCOUNTER — Encounter (HOSPITAL_COMMUNITY): Payer: Medicare Other | Admitting: Anesthesiology

## 2013-10-15 DIAGNOSIS — I10 Essential (primary) hypertension: Secondary | ICD-10-CM | POA: Insufficient documentation

## 2013-10-15 DIAGNOSIS — Z87891 Personal history of nicotine dependence: Secondary | ICD-10-CM | POA: Insufficient documentation

## 2013-10-15 DIAGNOSIS — Z8719 Personal history of other diseases of the digestive system: Secondary | ICD-10-CM | POA: Insufficient documentation

## 2013-10-15 DIAGNOSIS — Z87448 Personal history of other diseases of urinary system: Secondary | ICD-10-CM | POA: Insufficient documentation

## 2013-10-15 DIAGNOSIS — Z7982 Long term (current) use of aspirin: Secondary | ICD-10-CM | POA: Insufficient documentation

## 2013-10-15 DIAGNOSIS — E119 Type 2 diabetes mellitus without complications: Secondary | ICD-10-CM | POA: Diagnosis not present

## 2013-10-15 DIAGNOSIS — K8050 Calculus of bile duct without cholangitis or cholecystitis without obstruction: Secondary | ICD-10-CM | POA: Diagnosis not present

## 2013-10-15 DIAGNOSIS — I2510 Atherosclerotic heart disease of native coronary artery without angina pectoris: Secondary | ICD-10-CM | POA: Insufficient documentation

## 2013-10-15 DIAGNOSIS — Z862 Personal history of diseases of the blood and blood-forming organs and certain disorders involving the immune mechanism: Secondary | ICD-10-CM | POA: Insufficient documentation

## 2013-10-15 DIAGNOSIS — R7989 Other specified abnormal findings of blood chemistry: Secondary | ICD-10-CM | POA: Diagnosis not present

## 2013-10-15 DIAGNOSIS — K831 Obstruction of bile duct: Secondary | ICD-10-CM | POA: Diagnosis not present

## 2013-10-15 DIAGNOSIS — K8020 Calculus of gallbladder without cholecystitis without obstruction: Secondary | ICD-10-CM | POA: Diagnosis not present

## 2013-10-15 DIAGNOSIS — M109 Gout, unspecified: Secondary | ICD-10-CM | POA: Diagnosis not present

## 2013-10-15 DIAGNOSIS — R112 Nausea with vomiting, unspecified: Secondary | ICD-10-CM | POA: Insufficient documentation

## 2013-10-15 DIAGNOSIS — R110 Nausea: Secondary | ICD-10-CM | POA: Insufficient documentation

## 2013-10-15 DIAGNOSIS — R109 Unspecified abdominal pain: Secondary | ICD-10-CM | POA: Diagnosis not present

## 2013-10-15 DIAGNOSIS — R1110 Vomiting, unspecified: Secondary | ICD-10-CM | POA: Diagnosis not present

## 2013-10-15 DIAGNOSIS — M129 Arthropathy, unspecified: Secondary | ICD-10-CM | POA: Insufficient documentation

## 2013-10-15 DIAGNOSIS — Z79899 Other long term (current) drug therapy: Secondary | ICD-10-CM | POA: Diagnosis not present

## 2013-10-15 HISTORY — PX: ERCP: SHX5425

## 2013-10-15 HISTORY — PX: SPHINCTEROTOMY: SHX5544

## 2013-10-15 LAB — CBC
HCT: 36.1 % — ABNORMAL LOW (ref 39.0–52.0)
Hemoglobin: 11.8 g/dL — ABNORMAL LOW (ref 13.0–17.0)
Platelets: 146 10*3/uL — ABNORMAL LOW (ref 150–400)
RBC: 4.08 MIL/uL — ABNORMAL LOW (ref 4.22–5.81)
RDW: 15.4 % (ref 11.5–15.5)
WBC: 5.6 10*3/uL (ref 4.0–10.5)

## 2013-10-15 LAB — COMPREHENSIVE METABOLIC PANEL
AST: 22 U/L (ref 0–37)
Albumin: 3.9 g/dL (ref 3.5–5.2)
Alkaline Phosphatase: 80 U/L (ref 39–117)
BUN: 36 mg/dL — ABNORMAL HIGH (ref 6–23)
CO2: 23 mEq/L (ref 19–32)
Chloride: 105 mEq/L (ref 96–112)
Potassium: 4.6 mEq/L (ref 3.5–5.1)
Sodium: 138 mEq/L (ref 135–145)
Total Bilirubin: 0.5 mg/dL (ref 0.3–1.2)

## 2013-10-15 LAB — PROTIME-INR: Prothrombin Time: 13.6 seconds (ref 11.6–15.2)

## 2013-10-15 LAB — TYPE AND SCREEN: Antibody Screen: NEGATIVE

## 2013-10-15 SURGERY — ERCP, WITH INTERVENTION IF INDICATED
Anesthesia: General

## 2013-10-15 MED ORDER — GLYCOPYRROLATE 0.2 MG/ML IJ SOLN
INTRAMUSCULAR | Status: DC | PRN
  Administered 2013-10-15: 0.6 mg via INTRAVENOUS

## 2013-10-15 MED ORDER — NEOSTIGMINE METHYLSULFATE 1 MG/ML IJ SOLN
INTRAMUSCULAR | Status: DC | PRN
  Administered 2013-10-15: 4 mg via INTRAVENOUS

## 2013-10-15 MED ORDER — MIDAZOLAM HCL 5 MG/5ML IJ SOLN
INTRAMUSCULAR | Status: DC | PRN
  Administered 2013-10-15: 1 mg via INTRAVENOUS

## 2013-10-15 MED ORDER — FENTANYL CITRATE 0.05 MG/ML IJ SOLN
INTRAMUSCULAR | Status: DC | PRN
  Administered 2013-10-15 (×2): 50 ug via INTRAVENOUS

## 2013-10-15 MED ORDER — DEXTROSE 5 % IV SOLN
2.0000 g | INTRAVENOUS | Status: AC
  Administered 2013-10-15: 2 g via INTRAVENOUS
  Filled 2013-10-15: qty 2

## 2013-10-15 MED ORDER — SODIUM CHLORIDE 0.9 % IV SOLN: INTRAVENOUS | Status: DC

## 2013-10-15 MED ORDER — LIDOCAINE HCL (CARDIAC) 20 MG/ML IV SOLN
INTRAVENOUS | Status: DC | PRN
  Administered 2013-10-15: 100 mg via INTRAVENOUS

## 2013-10-15 MED ORDER — ROCURONIUM BROMIDE 100 MG/10ML IV SOLN
INTRAVENOUS | Status: DC | PRN
  Administered 2013-10-15: 40 mg via INTRAVENOUS

## 2013-10-15 MED ORDER — LACTATED RINGERS IV SOLN
INTRAVENOUS | Status: DC
  Administered 2013-10-15: 09:00:00 via INTRAVENOUS

## 2013-10-15 MED ORDER — SODIUM CHLORIDE 0.9 % IV SOLN
INTRAVENOUS | Status: DC | PRN
  Administered 2013-10-15: 10:00:00

## 2013-10-15 MED ORDER — PROPOFOL 10 MG/ML IV BOLUS
INTRAVENOUS | Status: DC | PRN
  Administered 2013-10-15: 50 mg via INTRAVENOUS
  Administered 2013-10-15: 150 mg via INTRAVENOUS

## 2013-10-15 NOTE — Transfer of Care (Signed)
Immediate Anesthesia Transfer of Care Note  Patient: Gerald Jacobs  Procedure(s) Performed: Procedure(s): ENDOSCOPIC RETROGRADE CHOLANGIOPANCREATOGRAPHY (ERCP) (N/A) SPHINCTEROTOMY  Patient Location: PACU  Anesthesia Type:General  Level of Consciousness: awake, alert  and oriented  Airway & Oxygen Therapy: Patient Spontanous Breathing and Patient connected to face mask oxygen  Post-op Assessment: Report given to PACU RN and Post -op Vital signs reviewed and stable  Post vital signs: Reviewed and stable  Complications: No apparent anesthesia complications

## 2013-10-15 NOTE — Progress Notes (Signed)
Gerald Jacobs 9:37 AM  Subjective: Other than some mild nausea the patient has no new complaints since we saw him in the office  Objective: Vital signs stable afebrile no acute distress please see pre-assessment evaluation for his exam labs reviewed  Assessment: Abnormal MRCP  Plan: The risks benefits methods of ERCP including success rate was discussed with the patient and the family again and will proceed today with the procedure and anesthesia with further workup and plans pending those findings  [facility]

## 2013-10-15 NOTE — Op Note (Signed)
[facility] [place], [address]   ERCP PROCEDURE REPORT  PATIENT: Casmir, Vota.  MR# :[id number] BIRTHDATE: September 23, 1925  GENDER: Male ENDOSCOPIST: Clarene Essex, MD REFERRED BY: Darlin Coco, M.D. PROCEDURE DATE:  10/15/2013 PROCEDURE:   ERCP with sphincterotomy/papillotomy and ERCP with removal of calculus/calculi ASA CLASS:    3 INDICATIONS: abnormal MRCP in patient with gallstones periodic elevated liver test and episodic nausea and pain MEDICATIONS:   general anesthesia TOPICAL ANESTHETIC:  none  DESCRIPTION OF PROCEDURE:   After the risks benefits and alternatives of the procedure were thoroughly explained, informed consent was obtained.  The Pentax ERCP [id number]  endoscope was introduced through the mouth and advanced to the second portion of the duodenum .a normal appearing ampulla was brought into view and using the triple-lumen sphincterotome loaded with the JAG Jagwire deep selective cannulation was obtained on the first attempt and a wire was advanced into the intrahepatics and dye was injected which confirmed a patent cystic duct a normal CBD with possibly some sludge and gallstones in the gallbladder but no obvious dominant stricture in the intrahepaticthat were filled. We then proceeded with the customary sphincterotomy in the usual fashion until we had adequate biliary drainage and could get the fully bowed sphincterotome easily in and out of the duct and then we exchanged the sphincterotome for the 12-15 mm adjustable balloon and proceeded with 3 balloon pull-through is in the customary fashion using the 12 mm balloon and some sludge was removed but no obvious stone and the balloon passed readily through the patent sphincterotomy site and then we proceeded with an occlusion cholangiogram which did not reveal an obvious stricture or residual stones and the balloon was pulled through the sphincterotomy site one more time without  resistance and there was adequate biliary drainage and the wire was removed and the scope was removed and the patient tolerated the procedure well there was no obvious immediate complication          COMPLICATIONS:   none  ENDOSCOPIC IMPRESSION:1. Normal ampulla status post medium sphincterotomy 2 sludge removed from CBD using the balloon as above 3. Patent cystic duct with small gallstones in the gallbladder 4. No obvious distal intrahepatic stricture 5. No pancreatic duct injections or wire advancement throughout the procedure  RECOMMENDATIONS:customary post-ERCP observation and orders and call me when necessary otherwise followup in a few weeks to recheck symptoms and make sure no further workup and plans are needed including consideration of cholecystectomy particularly if symptoms continue     _______________________________ eSigned:  Clarene Essex, MD 10/15/2013 10:27 AM   [id number] Mare Ferrari, MD  PATIENT NAME:  Avyukt, Higinbotham MR#: [id number]

## 2013-10-15 NOTE — ED Notes (Addendum)
Brought in by EMS from home with c/o abdominal pain with nausea and vomiting.  Per pt, he has been having abdominal pain with nausea and vomiting since he got home after his endoscopy earlier today.  Pt also reports that he has not voided since after his procedure.

## 2013-10-15 NOTE — Anesthesia Preprocedure Evaluation (Signed)
Anesthesia Evaluation  Patient identified by MRN, date of birth, ID band Patient awake    Reviewed: Allergy & Precautions, H&P , NPO status , Patient's Chart, lab work & pertinent test results  Airway Mallampati: II TM Distance: >3 FB Neck ROM: Full    Dental  (+) Edentulous Upper and Partial Lower   Pulmonary former smoker,  breath sounds clear to auscultation  Pulmonary exam normal       Cardiovascular hypertension, Pt. on medications and Pt. on home beta blockers + CAD Rhythm:Regular Rate:Normal     Neuro/Psych negative neurological ROS  negative psych ROS   GI/Hepatic negative GI ROS, Neg liver ROS,   Endo/Other  diabetes, Type 2, Oral Hypoglycemic Agents  Renal/GU Renal diseaseElevated Cr/BUN  negative genitourinary   Musculoskeletal negative musculoskeletal ROS (+)   Abdominal   Peds  Hematology negative hematology ROS (+) anemia ,   Anesthesia Other Findings   Reproductive/Obstetrics                           Anesthesia Physical Anesthesia Plan  ASA: III  Anesthesia Plan: General   Post-op Pain Management:    Induction: Intravenous  Airway Management Planned: Oral ETT  Additional Equipment:   Intra-op Plan:   Post-operative Plan: Extubation in OR  Informed Consent: I have reviewed the patients History and Physical, chart, labs and discussed the procedure including the risks, benefits and alternatives for the proposed anesthesia with the patient or authorized representative who has indicated his/her understanding and acceptance.   Dental advisory given  Plan Discussed with: CRNA  Anesthesia Plan Comments:         Anesthesia Quick Evaluation

## 2013-10-16 ENCOUNTER — Emergency Department (HOSPITAL_COMMUNITY): Payer: Medicare Other

## 2013-10-16 ENCOUNTER — Encounter (HOSPITAL_COMMUNITY): Payer: Self-pay | Admitting: Gastroenterology

## 2013-10-16 DIAGNOSIS — R109 Unspecified abdominal pain: Secondary | ICD-10-CM | POA: Diagnosis not present

## 2013-10-16 DIAGNOSIS — R112 Nausea with vomiting, unspecified: Secondary | ICD-10-CM | POA: Diagnosis not present

## 2013-10-16 LAB — CBC WITH DIFFERENTIAL/PLATELET
Basophils Relative: 0 % (ref 0–1)
Eosinophils Absolute: 0 10*3/uL (ref 0.0–0.7)
Eosinophils Relative: 0 % (ref 0–5)
HCT: 30.1 % — ABNORMAL LOW (ref 39.0–52.0)
Hemoglobin: 10.1 g/dL — ABNORMAL LOW (ref 13.0–17.0)
Lymphocytes Relative: 10 % — ABNORMAL LOW (ref 12–46)
Lymphs Abs: 0.9 10*3/uL (ref 0.7–4.0)
MCH: 29.2 pg (ref 26.0–34.0)
MCV: 87 fL (ref 78.0–100.0)
Monocytes Relative: 4 % (ref 3–12)
Neutro Abs: 7.6 10*3/uL (ref 1.7–7.7)
Platelets: 154 10*3/uL (ref 150–400)
RBC: 3.46 MIL/uL — ABNORMAL LOW (ref 4.22–5.81)

## 2013-10-16 LAB — LIPASE, BLOOD: Lipase: 162 U/L — ABNORMAL HIGH (ref 11–59)

## 2013-10-16 LAB — COMPREHENSIVE METABOLIC PANEL
ALT: 33 U/L (ref 0–53)
Albumin: 3.7 g/dL (ref 3.5–5.2)
Alkaline Phosphatase: 93 U/L (ref 39–117)
BUN: 30 mg/dL — ABNORMAL HIGH (ref 6–23)
Creatinine, Ser: 1.82 mg/dL — ABNORMAL HIGH (ref 0.50–1.35)
GFR calc Af Amer: 37 mL/min — ABNORMAL LOW (ref >90)
GFR calc non Af Amer: 32 mL/min — ABNORMAL LOW (ref >90)
Glucose, Bld: 191 mg/dL — ABNORMAL HIGH (ref 70–99)
Potassium: 4.2 mEq/L (ref 3.5–5.1)
Total Protein: 7.6 g/dL (ref 6.0–8.3)

## 2013-10-16 LAB — URINALYSIS, ROUTINE W REFLEX MICROSCOPIC
Bilirubin Urine: NEGATIVE
Ketones, ur: NEGATIVE mg/dL
Nitrite: NEGATIVE
Protein, ur: 100 mg/dL — AB
Specific Gravity, Urine: 1.021 (ref 1.005–1.030)
Urobilinogen, UA: 0.2 mg/dL (ref 0.0–1.0)
pH: 5.5 (ref 5.0–8.0)

## 2013-10-16 LAB — URINE MICROSCOPIC-ADD ON

## 2013-10-16 MED ORDER — SODIUM CHLORIDE 0.9 % IV BOLUS (SEPSIS)
500.0000 mL | Freq: Once | INTRAVENOUS | Status: AC
  Administered 2013-10-16: 500 mL via INTRAVENOUS

## 2013-10-16 MED ORDER — ONDANSETRON HCL 4 MG/2ML IJ SOLN
4.0000 mg | Freq: Once | INTRAMUSCULAR | Status: AC
  Administered 2013-10-16: 4 mg via INTRAVENOUS
  Filled 2013-10-16: qty 2

## 2013-10-16 MED ORDER — ONDANSETRON 4 MG PO TBDP: ORAL_TABLET | ORAL | Status: DC

## 2013-10-16 NOTE — ED Notes (Signed)
Pt requested ice water which was given--- pt tolerated well; pt denies nausea at this time, states "That Zofran you gave me fixed it well".

## 2013-10-16 NOTE — Anesthesia Postprocedure Evaluation (Signed)
Anesthesia Post Note  Patient: Gerald Jacobs  Procedure(s) Performed: Procedure(s) (LRB): ENDOSCOPIC RETROGRADE CHOLANGIOPANCREATOGRAPHY (ERCP) (N/A) SPHINCTEROTOMY  Anesthesia type: General  Patient location: PACU  Post pain: Pain level controlled  Post assessment: Post-op Vital signs reviewed  Last Vitals:  Filed Vitals:   10/15/13 1140  BP: 144/76  Pulse:   Temp:   Resp: 11    Post vital signs: Reviewed  Level of consciousness: sedated  Complications: No apparent anesthesia complications

## 2013-10-16 NOTE — ED Provider Notes (Signed)
CSN: [id number]     Arrival date & time 10/15/13  2348 History   First MD Initiated Contact with Patient 10/15/13 2351     Chief Complaint  Patient presents with  . Abdominal Pain   (Consider location/radiation/quality/duration/timing/severity/associated sxs/prior Treatment) HPI Patient with history of cholelithiasis and retained ductal stone at the ERCP done today but had no stenting. Patient states he felt well after the procedure this morning and went home. He then tried to eat he started having upper abdominal pain has had persistent vomiting since. Patient is concerned he may be mildly dehydrated. He currently has no pain. He continues to have some mild nausea. He's had decreased urine production. He denies any fevers or chills. Past Medical History  Diagnosis Date  . Essential hypertension   . Mild renal insufficiency   . Diabetes mellitus     Adult onset  . Chronic anemia     With normal iron studies and normal serum protein electrophoresis  . Coronary artery disease     Mild  . History of gout   . Nocturia     x2  . Osteoarthritis    Past Surgical History  Procedure Laterality Date  . Knee surgery  1979  . Prostate surgery  1993    Prostate Cancer  . Melanoma surgery  1970's    on scalp   Family History  Problem Relation Age of Onset  . Stroke Father   . Stroke Mother   . Stroke Sister   . Stroke Brother   . Stroke Brother   . Stroke Brother    History  Substance Use Topics  . Smoking status: Former Smoker -- 1.00 packs/day for 45 years    Types: Cigarettes    Quit date: 09/07/1979  . Smokeless tobacco: Never Used  . Alcohol Use: Yes     Comment: beer rarely    Review of Systems  Constitutional: Negative for fever and chills.  Respiratory: Negative for shortness of breath.   Cardiovascular: Negative for chest pain.  Gastrointestinal: Positive for nausea, vomiting and abdominal pain. Negative for diarrhea.  Musculoskeletal: Negative for back pain.   Skin: Negative for rash and wound.  Neurological: Negative for dizziness, weakness, light-headedness and numbness.  All other systems reviewed and are negative.    Allergies  Colchicine; Flexeril; and Zebeta  Home Medications   Current Outpatient Rx  Name  Route  Sig  Dispense  Refill  . allopurinol (ZYLOPRIM) 100 MG tablet   Oral   Take 1 tablet (100 mg total) by mouth daily.   90 tablet   3   . aspirin 81 MG tablet   Oral   Take 81 mg by mouth daily.           Marland Kitchen atorvastatin (LIPITOR) 10 MG tablet   Oral   Take 10 mg by mouth daily at 6 PM.         . diphenhydramine-acetaminophen (TYLENOL PM) 25-500 MG TABS   Oral   Take 1 tablet by mouth at bedtime as needed (sleep).         . losartan-hydrochlorothiazide (HYZAAR) 100-12.5 MG per tablet   Oral   Take 0.5 tablets by mouth as directed.          . metoprolol succinate (TOPROL-XL) 50 MG 24 hr tablet   Oral   Take 50 mg by mouth daily. Take with or immediately following a meal.         . sitaGLIPtin (JANUVIA) 50 MG tablet  Oral   Take 50 mg by mouth daily.         . mirtazapine (REMERON) 7.5 MG tablet   Oral   Take 7.5 mg by mouth as needed (apetite).           BP 163/58  Pulse 71  Temp(Src) 98.4 F (36.9 C) (Oral)  Resp 15  SpO2 100% Physical Exam  Nursing note and vitals reviewed. Constitutional: He is oriented to person, place, and time. He appears well-developed and well-nourished. No distress.  HENT:  Head: Normocephalic and atraumatic.  Mouth/Throat: Oropharynx is clear and moist.  Eyes: EOM are normal. Pupils are equal, round, and reactive to light.  Neck: Normal range of motion. Neck supple.  Cardiovascular: Normal rate and regular rhythm.   Pulmonary/Chest: Effort normal and breath sounds normal. No respiratory distress. He has no wheezes. He has no rales.  Abdominal: Soft. Bowel sounds are normal. He exhibits no distension and no mass. There is no tenderness. There is no rebound  and no guarding.  Musculoskeletal: Normal range of motion. He exhibits no edema and no tenderness.  Neurological: He is alert and oriented to person, place, and time.  Skin: Skin is warm and dry. No rash noted. No erythema.  Psychiatric: He has a normal mood and affect. His behavior is normal.    ED Course  Procedures (including critical care time) Labs Review Labs Reviewed  CBC WITH DIFFERENTIAL  COMPREHENSIVE METABOLIC PANEL  LIPASE, BLOOD  URINALYSIS, ROUTINE W REFLEX MICROSCOPIC   Imaging Review Dg Ercp With Sphincterotomy  10/15/2013   CLINICAL DATA:  Cholelithiasis, possible choledocholithiasis and abnormal MRCP study.  EXAM: ERCP  TECHNIQUE: Multiple spot images obtained with the fluoroscopic device and submitted for interpretation post-procedure.  COMPARISON:  MRCP dated 07/23/2013  FINDINGS: Imaging obtained during the ERCP procedure demonstrates cannulation of the common bile duct. Contrast injection shows a normal caliber CBD without obvious filling defect. There is some irregularity noted in a central intrahepatic left bile duct which was nonobstructive to injection of contrast. There is suggestion of eccentric filling defect at this level which did not move. Balloon occlusion cholangiogram at this level later during the procedure show less prominent irregularity. Contrast did enter the gallbladder with small filling defects noted consistent with calculi. No contrast extravasation was identified. A balloon sweep maneuver was performed.  IMPRESSION: Cholelithiasis. Irregularity of a central left intrahepatic bile duct without obstruction. The significance of this finding is unclear. Initial suggestion of eccentric nonobstructing filling defect was less prominent with a higher balloon occlusion injection. Cannot exclude by current imaging a subtle stricture or intraluminal material/tumor.  These images were submitted for radiologic interpretation only. Please see the procedural report  for the amount of contrast and the fluoroscopy time utilized.   Electronically Signed   By: Aletta Edouard M.D.   On: 10/15/2013 13:41    EKG Interpretation   None       MDM  Patient is well-appearing. Will check labs, get abdominal series and give IV fluids. Will then reassess patient.  Base abdominal pain has improved and he is tolerating by mouth's. He's had no nausea vomiting in the emergency department. His lipase is slightly elevated but there is no evidence for perforation. Patient is advised to followup with his gastroenterologist. Burnis Medin discharge home with nausea medication. Return precautions have been given.  Julianne Rice, MD 10/16/13 [phone]

## 2013-10-21 ENCOUNTER — Other Ambulatory Visit (HOSPITAL_COMMUNITY): Payer: Self-pay | Admitting: *Deleted

## 2013-10-22 ENCOUNTER — Encounter (HOSPITAL_COMMUNITY)
Admission: RE | Admit: 2013-10-22 | Discharge: 2013-10-22 | Disposition: A | Discharge status: Home / Self Care | Payer: Medicare Other | Source: Ambulatory Visit | Attending: Nephrology | Admitting: Nephrology

## 2013-10-22 LAB — POCT HEMOGLOBIN-HEMACUE: Hemoglobin: 9.9 g/dL — ABNORMAL LOW (ref 13.0–17.0)

## 2013-10-22 LAB — IRON AND TIBC
Saturation Ratios: 15 % — ABNORMAL LOW (ref 20–55)
TIBC: 319 ug/dL (ref 215–435)
UIBC: 271 ug/dL (ref 125–400)

## 2013-10-22 LAB — RENAL FUNCTION PANEL
Albumin: 3.6 g/dL (ref 3.5–5.2)
Creatinine, Ser: 2.37 mg/dL — ABNORMAL HIGH (ref 0.50–1.35)
GFR calc Af Amer: 27 mL/min — ABNORMAL LOW (ref >90)
GFR calc non Af Amer: 23 mL/min — ABNORMAL LOW (ref >90)
Phosphorus: 3 mg/dL (ref 2.3–4.6)
Potassium: 3.9 mEq/L (ref 3.5–5.1)
Sodium: 140 mEq/L (ref 135–145)

## 2013-10-22 LAB — FERRITIN: Ferritin: 14 ng/mL — ABNORMAL LOW (ref 22–322)

## 2013-10-22 MED ORDER — EPOETIN ALFA 10000 UNIT/ML IJ SOLN: 20000.0000 [IU] | INTRAMUSCULAR | Status: DC

## 2013-10-22 MED ORDER — EPOETIN ALFA 20000 UNIT/ML IJ SOLN
INTRAMUSCULAR | Status: AC
  Administered 2013-10-22: 20000 [IU] via SUBCUTANEOUS
  Filled 2013-10-22: qty 1

## 2013-11-08 DIAGNOSIS — R932 Abnormal findings on diagnostic imaging of liver and biliary tract: Secondary | ICD-10-CM | POA: Diagnosis not present

## 2013-11-08 DIAGNOSIS — D500 Iron deficiency anemia secondary to blood loss (chronic): Secondary | ICD-10-CM | POA: Diagnosis not present

## 2013-11-11 ENCOUNTER — Other Ambulatory Visit (HOSPITAL_COMMUNITY): Payer: Self-pay | Admitting: *Deleted

## 2013-11-12 ENCOUNTER — Encounter (HOSPITAL_COMMUNITY)
Admission: RE | Admit: 2013-11-12 | Discharge: 2013-11-12 | Disposition: A | Discharge status: Home / Self Care | Payer: Medicare Other | Source: Ambulatory Visit | Attending: Nephrology | Admitting: Nephrology

## 2013-11-12 DIAGNOSIS — D509 Iron deficiency anemia, unspecified: Secondary | ICD-10-CM | POA: Diagnosis not present

## 2013-11-12 MED ORDER — FERUMOXYTOL INJECTION 510 MG/17 ML
1020.0000 mg | Freq: Once | INTRAVENOUS | Status: AC
  Administered 2013-11-12: 10:00:00 1020 mg via INTRAVENOUS
  Filled 2013-11-12: qty 34

## 2013-11-12 MED ORDER — EPOETIN ALFA 20000 UNIT/ML IJ SOLN
INTRAMUSCULAR | Status: AC
  Filled 2013-11-12: qty 1

## 2013-11-12 MED ORDER — EPOETIN ALFA 10000 UNIT/ML IJ SOLN
20000.0000 [IU] | INTRAMUSCULAR | Status: DC
  Administered 2013-11-12: 20000 [IU] via SUBCUTANEOUS

## 2013-11-13 MED FILL — Epoetin Alfa Inj 20000 Unit/ML: INTRAMUSCULAR | Qty: 1 | Status: AC

## 2013-11-25 ENCOUNTER — Other Ambulatory Visit: Payer: Self-pay | Admitting: Cardiology

## 2013-12-03 ENCOUNTER — Encounter (HOSPITAL_COMMUNITY)
Admission: RE | Admit: 2013-12-03 | Discharge: 2013-12-03 | Disposition: A | Discharge status: Home / Self Care | Payer: Medicare Other | Source: Ambulatory Visit | Attending: Nephrology | Admitting: Nephrology

## 2013-12-03 DIAGNOSIS — D638 Anemia in other chronic diseases classified elsewhere: Secondary | ICD-10-CM | POA: Diagnosis not present

## 2013-12-03 DIAGNOSIS — N183 Chronic kidney disease, stage 3 unspecified: Secondary | ICD-10-CM | POA: Diagnosis not present

## 2013-12-03 LAB — RENAL FUNCTION PANEL
ALBUMIN: 3.5 g/dL (ref 3.5–5.2)
BUN: 42 mg/dL — ABNORMAL HIGH (ref 6–23)
CO2: 23 mEq/L (ref 19–32)
Calcium: 8.8 mg/dL (ref 8.4–10.5)
Chloride: 105 mEq/L (ref 96–112)
Creatinine, Ser: 2.15 mg/dL — ABNORMAL HIGH (ref 0.50–1.35)
GFR calc non Af Amer: 26 mL/min — ABNORMAL LOW (ref >90)
GFR, EST AFRICAN AMERICAN: 30 mL/min — AB (ref >90)
Glucose, Bld: 146 mg/dL — ABNORMAL HIGH (ref 70–99)
PHOSPHORUS: 3.5 mg/dL (ref 2.3–4.6)
POTASSIUM: 4.1 meq/L (ref 3.7–5.3)
Sodium: 141 mEq/L (ref 137–147)

## 2013-12-03 LAB — FERRITIN: FERRITIN: 397 ng/mL — AB (ref 22–322)

## 2013-12-03 LAB — IRON AND TIBC
Iron: 149 ug/dL — ABNORMAL HIGH (ref 42–135)
Saturation Ratios: 57 % — ABNORMAL HIGH (ref 20–55)
TIBC: 262 ug/dL (ref 215–435)
UIBC: 113 ug/dL — AB (ref 125–400)

## 2013-12-03 LAB — POCT HEMOGLOBIN-HEMACUE: HEMOGLOBIN: 10.8 g/dL — AB (ref 13.0–17.0)

## 2013-12-03 MED ORDER — EPOETIN ALFA 10000 UNIT/ML IJ SOLN: 20000.0000 [IU] | INTRAMUSCULAR | Status: DC

## 2013-12-03 MED ORDER — EPOETIN ALFA 20000 UNIT/ML IJ SOLN
INTRAMUSCULAR | Status: AC
Start: 2013-12-03 — End: 2013-12-04
  Administered 2013-12-04: 20000 [IU] via SUBCUTANEOUS
  Filled 2013-12-03: qty 1

## 2013-12-12 DIAGNOSIS — D500 Iron deficiency anemia secondary to blood loss (chronic): Secondary | ICD-10-CM | POA: Diagnosis not present

## 2013-12-18 DIAGNOSIS — D638 Anemia in other chronic diseases classified elsewhere: Secondary | ICD-10-CM | POA: Diagnosis not present

## 2013-12-18 DIAGNOSIS — M948X9 Other specified disorders of cartilage, unspecified sites: Secondary | ICD-10-CM | POA: Diagnosis not present

## 2013-12-18 DIAGNOSIS — N184 Chronic kidney disease, stage 4 (severe): Secondary | ICD-10-CM | POA: Diagnosis not present

## 2013-12-18 DIAGNOSIS — I10 Essential (primary) hypertension: Secondary | ICD-10-CM | POA: Diagnosis not present

## 2013-12-24 ENCOUNTER — Encounter (HOSPITAL_COMMUNITY)
Admission: RE | Admit: 2013-12-24 | Discharge: 2013-12-24 | Disposition: A | Discharge status: Home / Self Care | Payer: Medicare Other | Source: Ambulatory Visit | Attending: Nephrology | Admitting: Nephrology

## 2013-12-24 LAB — POCT HEMOGLOBIN-HEMACUE: Hemoglobin: 11.7 g/dL — ABNORMAL LOW (ref 13.0–17.0)

## 2013-12-24 MED ORDER — EPOETIN ALFA 10000 UNIT/ML IJ SOLN: 20000.0000 [IU] | INTRAMUSCULAR | Status: DC

## 2013-12-25 ENCOUNTER — Encounter: Payer: Self-pay | Admitting: Cardiology

## 2013-12-25 ENCOUNTER — Ambulatory Visit (INDEPENDENT_AMBULATORY_CARE_PROVIDER_SITE_OTHER): Payer: Medicare Other | Admitting: Cardiology

## 2013-12-25 VITALS — BP 149/54 | HR 63 | Ht 68.0 in | Wt 149.0 lb

## 2013-12-25 DIAGNOSIS — I119 Hypertensive heart disease without heart failure: Secondary | ICD-10-CM | POA: Diagnosis not present

## 2013-12-25 DIAGNOSIS — N183 Chronic kidney disease, stage 3 unspecified: Secondary | ICD-10-CM | POA: Diagnosis not present

## 2013-12-25 DIAGNOSIS — K8020 Calculus of gallbladder without cholecystitis without obstruction: Secondary | ICD-10-CM

## 2013-12-25 DIAGNOSIS — E7800 Pure hypercholesterolemia, unspecified: Secondary | ICD-10-CM | POA: Diagnosis not present

## 2013-12-25 DIAGNOSIS — IMO0001 Reserved for inherently not codable concepts without codable children: Secondary | ICD-10-CM

## 2013-12-25 DIAGNOSIS — E1165 Type 2 diabetes mellitus with hyperglycemia: Secondary | ICD-10-CM

## 2013-12-25 NOTE — Patient Instructions (Signed)
Your physician recommends that you continue on your current medications as directed. Please refer to the Current Medication list given to you today.  Your physician wants you to follow-up in: 3 months with fasting labs (LP/BMET/HFP)  You will receive a reminder letter in the mail two months in advance. If you don't receive a letter, please call our office to schedule the follow-up appointment.

## 2013-12-25 NOTE — Assessment & Plan Note (Signed)
The patient has diabetes.  He was asking me if I would consider switching him to metformin.  However with his renal insufficiency and his elderly age I told him I would not be comfortable doing that.  He would be at higher risk of lactic acidosis with his risk factors.  He would probably benefit eventually by going on insulin.  I talked to him about possible referral to a diabetic specialist and he will consider that.  It is also possible that if he has gallbladder surgery that he could be transitioned to insulin while in the hospital.

## 2013-12-25 NOTE — Assessment & Plan Note (Signed)
The patient has continued to have intermittent episodes of abdominal pain and bloating consistent with his known gallbladder disease.  His previous ultrasound on 05/28/13 showed multiple small stones.  He is wondering about going ahead with cholecystectomy and I have encouraged him to see the surgeons and talk to them about it.  I think he would be a reasonable candidate for cholecystectomy.

## 2013-12-25 NOTE — Assessment & Plan Note (Signed)
Blood pressure has been remaining stable on current therapy.  He is not having any symptoms of congestive heart failure.  No fluid retention.

## 2013-12-25 NOTE — Progress Notes (Signed)
Gerald Jacobs Date of Birth:  [birth year] [address] [place], [place]  [address] [phone]         Fax   [phone]  History of Present Illness: This pleasant 80 year old gentleman is seen for a scheduled followup office visit.  He has a past history of essential hypertension. He also has a history of dyslipidemia, diabetes, and mild renal insufficiency. Also has a history of hyperuricemia. He has anemia secondary to chronic renal disease and receives ejections of Procrit about every 3 weeks depending on his hemoglobin level. Dr. Posey Pronto is his nephrologist.  Over the course of the summer he developed signs and symptoms of cholecystitis with abdominal pain and his subsequent MRI has shown a 7 mm stone in one of the upper ducts.  He has had an ERCP Current Outpatient Prescriptions  Medication Sig Dispense Refill  . allopurinol (ZYLOPRIM) 100 MG tablet TAKE 1 TABLET DAILY  90 tablet  1  . aspirin 81 MG tablet Take 81 mg by mouth daily.        Marland Kitchen atorvastatin (LIPITOR) 10 MG tablet Take 10 mg by mouth daily at 6 PM.      . Biotin 1000 MCG tablet Take 1,000 mcg by mouth daily.      . diphenhydramine-acetaminophen (TYLENOL PM) 25-500 MG TABS Take 1 tablet by mouth at bedtime as needed (sleep).      . losartan-hydrochlorothiazide (HYZAAR) 100-12.5 MG per tablet Take 0.5 tablets by mouth as directed.       . metoprolol succinate (TOPROL-XL) 50 MG 24 hr tablet Take 50 mg by mouth daily. Take with or immediately following a meal.      . mirtazapine (REMERON) 7.5 MG tablet Take 7.5 mg by mouth as needed (apetite).       . sitaGLIPtin (JANUVIA) 50 MG tablet Take 50 mg by mouth daily.       No current facility-administered medications for this visit.    Allergies  Allergen Reactions  . Colchicine     GI problems  . Flexeril [Cyclobenzaprine Hcl]     GI problems   . Zebeta     Gi problems    Patient Active Problem List   Diagnosis Date Noted  . Choledocholithiasis  08/28/2013  . Cholelithiasis 06/04/2013  . Diarrhea due to drug 06/04/2013  . Elevated LFTs 06/04/2013  . Benign hypertensive heart disease without heart failure 09/07/2011  . Type II or unspecified type diabetes mellitus without mention of complication, uncontrolled 09/07/2011  . Dyslipidemia 09/07/2011  . Chronic renal insufficiency, stage III (moderate) 09/07/2011    History  Smoking status  . Former Smoker -- 1.00 packs/day for 45 years  . Types: Cigarettes  . Quit date: 09/07/1979  Smokeless tobacco  . Never Used    History  Alcohol Use  . Yes    Comment: beer rarely    Family History  Problem Relation Age of Onset  . Stroke Father   . Stroke Mother   . Stroke Sister   . Stroke Brother   . Stroke Brother   . Stroke Brother     Review of Systems: Constitutional: no fever chills diaphoresis or fatigue or change in weight.  Head and neck: no hearing loss, no epistaxis, no photophobia or visual disturbance. Respiratory: No cough, shortness of breath or wheezing. Cardiovascular: No chest pain peripheral edema, palpitations. Gastrointestinal: No abdominal distention, no abdominal pain, no change in bowel habits hematochezia or melena. Genitourinary: No dysuria, no frequency, no  urgency, no nocturia. Musculoskeletal:No arthralgias, no back pain, no gait disturbance or myalgias. Neurological: No dizziness, no headaches, no numbness, no seizures, no syncope, no weakness, no tremors. Hematologic: No lymphadenopathy, no easy bruising. Psychiatric: No confusion, no hallucinations, no sleep disturbance.    Physical Exam: Filed Vitals:   12/25/13 0931  BP: 149/54  Pulse: 63   the general appearance reveals a well-developed elderly gentleman in no distress.The head and neck exam reveals pupils equal and reactive.  Extraocular movements are full.  There is no scleral icterus.  The mouth and pharynx are normal.  The neck is supple.  The carotids reveal no bruits.  The  jugular venous pressure is normal.  The  thyroid is not enlarged.  There is no lymphadenopathy.  The chest is clear to percussion and auscultation.  There are no rales or rhonchi.  Expansion of the chest is symmetrical.  The precordium is quiet.  The first heart sound is normal.  The second heart sound is physiologically split.  There is no murmur gallop rub or click.  There is no abnormal lift or heave.  The abdomen is soft and nontender.  The bowel sounds are normal.  The liver and spleen are not enlarged.  There are no abdominal masses.  There are no abdominal bruits.  Extremities reveal good pedal pulses.  There is no phlebitis or edema.  There is no cyanosis or clubbing.  Strength is normal and symmetrical in all extremities.  There is no lateralizing weakness.  There are no sensory deficits.  The skin is warm and dry.  There is no rash.     Assessment / Plan: Continue same medication.  He will be contacting [facility] concerning proceeding with cholecystectomy.  From a cardiac standpoint he will be a reasonable candidate.  His most recent EKG on 06/04/13 was normal. Recheck here in 3 months for followup office visit lipid panel hepatic function panel and basal metabolic panel.

## 2013-12-30 DIAGNOSIS — E119 Type 2 diabetes mellitus without complications: Secondary | ICD-10-CM | POA: Diagnosis not present

## 2013-12-30 DIAGNOSIS — H40019 Open angle with borderline findings, low risk, unspecified eye: Secondary | ICD-10-CM | POA: Diagnosis not present

## 2014-01-07 ENCOUNTER — Encounter (HOSPITAL_COMMUNITY)
Admission: RE | Admit: 2014-01-07 | Discharge: 2014-01-07 | Disposition: A | Discharge status: Home / Self Care | Payer: Medicare Other | Source: Ambulatory Visit | Attending: Nephrology | Admitting: Nephrology

## 2014-01-07 DIAGNOSIS — D638 Anemia in other chronic diseases classified elsewhere: Secondary | ICD-10-CM | POA: Insufficient documentation

## 2014-01-07 DIAGNOSIS — N183 Chronic kidney disease, stage 3 unspecified: Secondary | ICD-10-CM | POA: Diagnosis not present

## 2014-01-07 LAB — POCT HEMOGLOBIN-HEMACUE: Hemoglobin: 11 g/dL — ABNORMAL LOW (ref 13.0–17.0)

## 2014-01-07 MED ORDER — EPOETIN ALFA 20000 UNIT/ML IJ SOLN
INTRAMUSCULAR | Status: AC
  Administered 2014-01-07: 20000 [IU] via SUBCUTANEOUS
  Filled 2014-01-07: qty 1

## 2014-01-07 MED ORDER — EPOETIN ALFA 10000 UNIT/ML IJ SOLN: 20000.0000 [IU] | INTRAMUSCULAR | Status: DC

## 2014-01-16 ENCOUNTER — Ambulatory Visit (INDEPENDENT_AMBULATORY_CARE_PROVIDER_SITE_OTHER): Payer: Medicare Other | Admitting: General Surgery

## 2014-01-16 ENCOUNTER — Encounter (INDEPENDENT_AMBULATORY_CARE_PROVIDER_SITE_OTHER): Payer: Self-pay | Admitting: General Surgery

## 2014-01-16 VITALS — BP 148/62 | HR 68 | Resp 20 | Ht 68.0 in | Wt 149.6 lb

## 2014-01-16 DIAGNOSIS — K8020 Calculus of gallbladder without cholecystitis without obstruction: Secondary | ICD-10-CM | POA: Diagnosis not present

## 2014-01-16 NOTE — Patient Instructions (Signed)
[facility] ______[facility] °LAPAROSCOPIC SURGERY: POST OP INSTRUCTIONS °Always review your discharge instruction sheet given to you by the facility where your surgery was performed. °IF YOU HAVE DISABILITY OR FAMILY LEAVE FORMS, YOU MUST BRING THEM TO THE OFFICE FOR PROCESSING.   °DO NOT GIVE THEM TO YOUR DOCTOR. ° °1. A prescription for pain medication may be given to you upon discharge.  Take your pain medication as prescribed, if needed.  If narcotic pain medicine is not needed, then you may take acetaminophen (Tylenol) or ibuprofen (Advil) as needed. °2. Take your usually prescribed medications unless otherwise directed. °3. If you need a refill on your pain medication, please contact your pharmacy.  They will contact our office to request authorization. Prescriptions will not be filled after 5pm or on week-ends. °4. You should follow a light diet the first few days after arrival home, such as soup and crackers, etc.  Be sure to include lots of fluids daily. °5. Most patients will experience some swelling and bruising in the area of the incisions.  Ice packs will help.  Swelling and bruising can take several days to resolve.  °6. It is common to experience some constipation if taking pain medication after surgery.  Increasing fluid intake and taking a stool softener (such as Colace) will usually help or prevent this problem from occurring.  A mild laxative (Milk of Magnesia or Miralax) should be taken according to package instructions if there are no bowel movements after 48 hours. °7. Unless discharge instructions indicate otherwise, you may remove your bandages 24-48 hours after surgery, and you may shower at that time.  You may have steri-strips (small skin tapes) in place directly over the incision.  These strips should be left on the skin for 7-10 days.  If your surgeon used skin glue on the incision, you may shower in 24 hours.  The glue will flake off over the next 2-3 weeks.  Any sutures or  staples will be removed at the office during your follow-up visit. °8. ACTIVITIES:  You may resume regular (light) daily activities beginning the next day--such as daily self-care, walking, climbing stairs--gradually increasing activities as tolerated.  You may have sexual intercourse when it is comfortable.  Refrain from any heavy lifting or straining until approved by your doctor. °a. You may drive when you are no longer taking prescription pain medication, you can comfortably wear a seatbelt, and you can safely maneuver your car and apply brakes. °b. RETURN TO WORK:  __________________________________________________________ °9. You should see your doctor in the office for a follow-up appointment approximately 2-3 weeks after your surgery.  Make sure that you call for this appointment within a day or two after you arrive home to insure a convenient appointment time. °10. OTHER INSTRUCTIONS: __________________________________________________________________________________________________________________________ __________________________________________________________________________________________________________________________ °WHEN TO CALL YOUR DOCTOR: °1. Fever over 101.0 °2. Inability to urinate °3. Continued bleeding from incision. °4. Increased pain, redness, or drainage from the incision. °5. Increasing abdominal pain ° °The clinic staff is available to answer your questions during regular business hours.  Please don’t hesitate to call and ask to speak to one of the nurses for clinical concerns.  If you have a medical emergency, go to the nearest emergency room or call 911.  A surgeon from [facility] is always on call at the hospital. °[address], [address], [place], [place]  [address] ? [address] [address], [place], [place]   [address] °[phone] ? [phone] ? FAX [phone] °Web site:   [email or web address] °

## 2014-01-16 NOTE — Progress Notes (Signed)
Patient ID: Gerald Jacobs, male   DOB: 1925/05/18, 80 y.o.   MRN: [id number]  Chief Complaint  Patient presents with  . Cholelithiasis    HPI RAIMOND Jacobs is a 80 y.o. male.   HPI  He is referred by Dr. Mare Ferrari for further evaluation and treatment of symptomatic cholelithiasis. In June of 2014 he had a  case of biliary colic. He had some epigastric pain, nausea and vomiting and distention. He was evaluated and noted to have gallstones with a concern for choledocholithiasis as well. In November of 2014 he underwent ERCP and some sludge was removed from the bile duct. A sphincterotomy was performed. He now presents to discuss laparoscopic cholecystectomy. He has not had any significant attacks since the ERCP.  He is here with his wife.  Past Medical History  Diagnosis Date  . Essential hypertension   . Mild renal insufficiency   . Diabetes mellitus     Adult onset  . Chronic anemia     With normal iron studies and normal serum protein electrophoresis  . Coronary artery disease     Mild  . History of gout   . Nocturia     x2  . Osteoarthritis     Past Surgical History  Procedure Laterality Date  . Knee surgery  1979  . Prostate surgery  1993    Prostate Cancer  . Melanoma surgery  1970's    on scalp  . Ercp N/A 10/15/2013    Procedure: ENDOSCOPIC RETROGRADE CHOLANGIOPANCREATOGRAPHY (ERCP);  Surgeon: Jeryl Columbia, MD;  Location: [facility];  Service: Endoscopy;  Laterality: N/A;  . Sphincterotomy  10/15/2013    Procedure: SPHINCTEROTOMY;  Surgeon: Jeryl Columbia, MD;  Location: [facility];  Service: Endoscopy;;    Family History  Problem Relation Age of Onset  . Stroke Father   . Stroke Mother   . Stroke Sister   . Stroke Brother   . Stroke Brother   . Stroke Brother     Social History History  Substance Use Topics  . Smoking status: Former Smoker -- 1.00 packs/day for 45 years    Types: Cigarettes    Quit date: 09/07/1979  . Smokeless tobacco: Never Used   . Alcohol Use: Yes     Comment: beer rarely    Allergies  Allergen Reactions  . Colchicine     GI problems  . Flexeril [Cyclobenzaprine Hcl]     GI problems   . Percocet [Oxycodone-Acetaminophen] Nausea And Vomiting  . Zebeta     Gi problems    Current Outpatient Prescriptions  Medication Sig Dispense Refill  . allopurinol (ZYLOPRIM) 100 MG tablet TAKE 1 TABLET DAILY  90 tablet  1  . aspirin 81 MG tablet Take 81 mg by mouth daily.        Marland Kitchen atorvastatin (LIPITOR) 10 MG tablet Take 10 mg by mouth daily at 6 PM.      . Biotin 1000 MCG tablet Take 1,000 mcg by mouth daily.      . diphenhydramine-acetaminophen (TYLENOL PM) 25-500 MG TABS Take 1 tablet by mouth at bedtime as needed (sleep).      . losartan-hydrochlorothiazide (HYZAAR) 100-12.5 MG per tablet Take 0.5 tablets by mouth as directed.       . metoprolol succinate (TOPROL-XL) 50 MG 24 hr tablet Take 50 mg by mouth daily. Take with or immediately following a meal.      . mirtazapine (REMERON) 7.5 MG tablet Take 7.5 mg by mouth as  needed (apetite).       . sitaGLIPtin (JANUVIA) 50 MG tablet Take 50 mg by mouth daily.       No current facility-administered medications for this visit.    Review of Systems Review of Systems  Constitutional: Negative.   Respiratory: Negative.   Cardiovascular: Negative.   Gastrointestinal: Negative.   Genitourinary: Negative.   Hematological: Negative.     Blood pressure 148/62, pulse 68, resp. rate 20, height 5\' 8"  (1.727 m), weight 149 lb 9.6 oz (67.858 kg).  Physical Exam Physical Exam  Constitutional: He appears well-developed and well-nourished. No distress.  HENT:  Head: Normocephalic and atraumatic.  Eyes: No scleral icterus.  Wears glasses.  Cardiovascular: Normal rate and regular rhythm.   Pulmonary/Chest: Effort normal and breath sounds normal.  Abdominal: Soft. He exhibits no distension and no mass. There is no tenderness.  Lower midline scar  Musculoskeletal: He  exhibits no edema.  Neurological: He is alert.  Skin: Skin is warm and dry.  Psychiatric: He has a normal mood and affect. His behavior is normal.    Data Reviewed Ultrasound an MRCP report. ERCP report. Note from Dr. Mare Ferrari.  Assessment    Symptomatic cholelithiasis. We discussed nonoperative and operative options. He would like to proceed with cholecystectomy.     Plan    Laparoscopic cholecystectomy with cholangiogram.  I have explained the procedure, risks, and aftercare of cholecystectomy.  Risks include but are not limited to bleeding, infection, wound problems, anesthesia, diarrhea, bile leak, injury to common bile duct/liver/intestine, death.  He seems to understand and agrees to proceed.         Caven Perine J 01/16/2014, 10:30 AM

## 2014-01-18 ENCOUNTER — Other Ambulatory Visit: Payer: Self-pay | Admitting: Cardiology

## 2014-01-23 ENCOUNTER — Encounter (HOSPITAL_COMMUNITY): Payer: Self-pay | Admitting: Pharmacy Technician

## 2014-01-27 ENCOUNTER — Other Ambulatory Visit (HOSPITAL_COMMUNITY): Payer: Self-pay | Admitting: *Deleted

## 2014-01-28 ENCOUNTER — Encounter (HOSPITAL_COMMUNITY): Payer: Self-pay

## 2014-01-28 ENCOUNTER — Encounter (HOSPITAL_COMMUNITY)
Admission: RE | Admit: 2014-01-28 | Discharge: 2014-01-28 | Disposition: A | Discharge status: Home / Self Care | Payer: Medicare Other | Source: Ambulatory Visit | Attending: Nephrology | Admitting: Nephrology

## 2014-01-28 ENCOUNTER — Encounter (HOSPITAL_COMMUNITY)
Admission: RE | Admit: 2014-01-28 | Discharge: 2014-01-28 | Disposition: A | Discharge status: Home / Self Care | Payer: Medicare Other | Source: Ambulatory Visit | Attending: General Surgery | Admitting: General Surgery

## 2014-01-28 ENCOUNTER — Encounter (HOSPITAL_COMMUNITY)
Admission: RE | Admit: 2014-01-28 | Discharge: 2014-01-28 | Disposition: A | Discharge status: Home / Self Care | Payer: Medicare Other | Source: Ambulatory Visit | Attending: Anesthesiology | Admitting: Anesthesiology

## 2014-01-28 DIAGNOSIS — N183 Chronic kidney disease, stage 3 unspecified: Secondary | ICD-10-CM | POA: Insufficient documentation

## 2014-01-28 DIAGNOSIS — Z01818 Encounter for other preprocedural examination: Secondary | ICD-10-CM | POA: Insufficient documentation

## 2014-01-28 DIAGNOSIS — J984 Other disorders of lung: Secondary | ICD-10-CM | POA: Diagnosis not present

## 2014-01-28 DIAGNOSIS — Z01812 Encounter for preprocedural laboratory examination: Secondary | ICD-10-CM | POA: Insufficient documentation

## 2014-01-28 DIAGNOSIS — D638 Anemia in other chronic diseases classified elsewhere: Secondary | ICD-10-CM | POA: Insufficient documentation

## 2014-01-28 HISTORY — DX: Malignant (primary) neoplasm, unspecified: C80.1

## 2014-01-28 HISTORY — DX: Gastro-esophageal reflux disease without esophagitis: K21.9

## 2014-01-28 LAB — COMPREHENSIVE METABOLIC PANEL
ALT: 21 U/L (ref 0–53)
AST: 24 U/L (ref 0–37)
Albumin: 3.6 g/dL (ref 3.5–5.2)
Alkaline Phosphatase: 68 U/L (ref 39–117)
BUN: 38 mg/dL — ABNORMAL HIGH (ref 6–23)
CALCIUM: 9.2 mg/dL (ref 8.4–10.5)
CO2: 23 mEq/L (ref 19–32)
CREATININE: 1.97 mg/dL — AB (ref 0.50–1.35)
Chloride: 106 mEq/L (ref 96–112)
GFR, EST AFRICAN AMERICAN: 34 mL/min — AB (ref >90)
GFR, EST NON AFRICAN AMERICAN: 29 mL/min — AB (ref >90)
GLUCOSE: 152 mg/dL — AB (ref 70–99)
Potassium: 4.6 mEq/L (ref 3.7–5.3)
Sodium: 142 mEq/L (ref 137–147)
Total Bilirubin: 0.3 mg/dL (ref 0.3–1.2)
Total Protein: 7.5 g/dL (ref 6.0–8.3)

## 2014-01-28 LAB — RENAL FUNCTION PANEL
Albumin: 3.7 g/dL (ref 3.5–5.2)
BUN: 39 mg/dL — AB (ref 6–23)
CHLORIDE: 107 meq/L (ref 96–112)
CO2: 24 mEq/L (ref 19–32)
CREATININE: 2.09 mg/dL — AB (ref 0.50–1.35)
Calcium: 9.1 mg/dL (ref 8.4–10.5)
GFR calc Af Amer: 31 mL/min — ABNORMAL LOW (ref >90)
GFR calc non Af Amer: 27 mL/min — ABNORMAL LOW (ref >90)
GLUCOSE: 188 mg/dL — AB (ref 70–99)
POTASSIUM: 4.5 meq/L (ref 3.7–5.3)
Phosphorus: 3 mg/dL (ref 2.3–4.6)
Sodium: 144 mEq/L (ref 137–147)

## 2014-01-28 LAB — CBC WITH DIFFERENTIAL/PLATELET
Basophils Absolute: 0 10*3/uL (ref 0.0–0.1)
Basophils Relative: 0 % (ref 0–1)
EOS PCT: 5 % (ref 0–5)
Eosinophils Absolute: 0.4 10*3/uL (ref 0.0–0.7)
HEMATOCRIT: 33.2 % — AB (ref 39.0–52.0)
Hemoglobin: 11.3 g/dL — ABNORMAL LOW (ref 13.0–17.0)
LYMPHS ABS: 2.4 10*3/uL (ref 0.7–4.0)
Lymphocytes Relative: 32 % (ref 12–46)
MCH: 32.3 pg (ref 26.0–34.0)
MCHC: 34 g/dL (ref 30.0–36.0)
MCV: 94.9 fL (ref 78.0–100.0)
MONO ABS: 0.6 10*3/uL (ref 0.1–1.0)
Monocytes Relative: 8 % (ref 3–12)
Neutro Abs: 4.1 10*3/uL (ref 1.7–7.7)
Neutrophils Relative %: 55 % (ref 43–77)
Platelets: 145 10*3/uL — ABNORMAL LOW (ref 150–400)
RBC: 3.5 MIL/uL — AB (ref 4.22–5.81)
RDW: 16.1 % — ABNORMAL HIGH (ref 11.5–15.5)
WBC: 7.4 10*3/uL (ref 4.0–10.5)

## 2014-01-28 LAB — FERRITIN: Ferritin: 167 ng/mL (ref 22–322)

## 2014-01-28 LAB — IRON AND TIBC
IRON: 117 ug/dL (ref 42–135)
Saturation Ratios: 41 % (ref 20–55)
TIBC: 283 ug/dL (ref 215–435)
UIBC: 166 ug/dL (ref 125–400)

## 2014-01-28 LAB — PROTIME-INR
INR: 1.12 (ref 0.00–1.49)
PROTHROMBIN TIME: 14.2 s (ref 11.6–15.2)

## 2014-01-28 MED ORDER — EPOETIN ALFA 20000 UNIT/ML IJ SOLN
INTRAMUSCULAR | Status: AC
Start: 2014-01-28 — End: 2014-01-28
  Administered 2014-01-28: 20000 [IU]
  Filled 2014-01-28: qty 1

## 2014-01-28 MED ORDER — EPOETIN ALFA 10000 UNIT/ML IJ SOLN: 20000.0000 [IU] | INTRAMUSCULAR | Status: DC

## 2014-01-28 NOTE — Progress Notes (Signed)
Anesthesia Chart Review:  Patient is a 80 year old male scheduled for laparoscopic cholecystectomy on 02/04/14 by Dr. Zella Richer.    History includes former smoker, HTN, DM2, CKD stage IV, anemia receiving Procrit, CAD, GERD, gout, prostate cancer s/p surgery '93, mild CAD (40% pLAD '02).  Cardiologist/PCP is Dr. Mare Ferrari, last visit 12/25/13 who felt he was "reasonable candidate."  Nephrologist is Dr. Elmarie Shiley.  EKG on 06/04/13 showed SR with sinus arrhythmia.  Based on his discharge summary from 09/14/01, cath showed normal LM, 40% proximal LAD, distal vessel tapers and is of small caliber. CX with large OM1 and PL branch. RCA somewhat small edning in a PDA that is free of disease. Normal LV function, EF 60-65%.  Epic does not list any record of an echo done within the past three years.  CXR on 01/28/14 showed no acute findings.  Preoperative labs noted.  BUN/Cr 38/1.97 and appear stable.  Glucose 152. K 4.6. H/H 11.3/33.2. PLT count 145K. PT/INR WNL.  Dr. Mare Ferrari is aware of plans for surgery.  Pre-operative results appear acceptable for OR.  If no acute changes then I would anticipate that he could proceed as planned.  [name] [facility] Short Stay Center/Anesthesiology Phone [phone] 01/28/2014 3:34 PM

## 2014-01-28 NOTE — Pre-Procedure Instructions (Addendum)
Gerald Jacobs  01/28/2014   Your procedure is scheduled on:  02/04/14  Report to [facility] cone short stay admitting at 51 AM.  Call this number if you have problems the morning of surgery: [phone]   Remember:   Do not eat food or drink liquids after midnight.   Take these medicines the morning of surgery with A SIP OF WATER: allopurinol, metoprolol         STOP all herbel meds, nsaids (aleve,naproxen,advil,ibuprofen) 5 days prior to surgery including vitamins, aspirin ,biotin, NO DIABETIC MEDS AM OF SURGERY   Do not wear jewelry, make-up or nail polish.  Do not wear lotions, powders, or perfumes. You may wear deodorant.  Do not shave 48 hours prior to surgery. Men may shave face and neck.  Do not bring valuables to the hospital.  [facility] is not responsible                  for any belongings or valuables.               Contacts, dentures or bridgework may not be worn into surgery.  Leave suitcase in the car. After surgery it may be brought to your room.  For patients admitted to the hospital, discharge time is determined by your                treatment team.               Patients discharged the day of surgery will not be allowed to drive  home.  Name and phone number of your driver:   Special Instructions:  Special Instructions: [facility] - Preparing for Surgery  Before surgery, you can play an important role.  Because skin is not sterile, your skin needs to be as free of germs as possible.  You can reduce the number of germs on you skin by washing with CHG (chlorahexidine gluconate) soap before surgery.  CHG is an antiseptic cleaner which kills germs and bonds with the skin to continue killing germs even after washing.  Please DO NOT use if you have an allergy to CHG or antibacterial soaps.  If your skin becomes reddened/irritated stop using the CHG and inform your nurse when you arrive at Short Stay.  Do not shave (including legs and underarms) for at least 48 hours prior  to the first CHG shower.  You may shave your face.  Please follow these instructions carefully:   1.  Shower with CHG Soap the night before surgery and the morning of Surgery.  2.  If you choose to wash your hair, wash your hair first as usual with your normal shampoo.  3.  After you shampoo, rinse your hair and body thoroughly to remove the Shampoo.  4.  Use CHG as you would any other liquid soap.  You can apply chg directly  to the skin and wash gently with scrungie or a clean washcloth.  5.  Apply the CHG Soap to your body ONLY FROM THE NECK DOWN.  Do not use on open wounds or open sores.  Avoid contact with your eyes ears, mouth and genitals (private parts).  Wash genitals (private parts)       with your normal soap.  6.  Wash thoroughly, paying special attention to the area where your surgery will be performed.  7.  Thoroughly rinse your body with warm water from the neck down.  8.  DO NOT shower/wash with your normal soap after  using and rinsing off the CHG Soap.  9.  Pat yourself dry with a clean towel.            10.  Wear clean pajamas.            11.  Place clean sheets on your bed the night of your first shower and do not sleep with pets.  Day of Surgery  Do not apply any lotions/deodorants the morning of surgery.  Please wear clean clothes to the hospital/surgery center.   Please read over the following fact sheets that you were given: Pain Booklet, Coughing and Deep Breathing and Surgical Site Infection Prevention

## 2014-01-31 LAB — POCT HEMOGLOBIN-HEMACUE: HEMOGLOBIN: 10.9 g/dL — AB (ref 13.0–17.0)

## 2014-02-03 MED ORDER — CEFAZOLIN SODIUM-DEXTROSE 2-3 GM-% IV SOLR
2.0000 g | INTRAVENOUS | Status: AC
  Administered 2014-02-04: 2 g via INTRAVENOUS
  Filled 2014-02-03: qty 50

## 2014-02-04 ENCOUNTER — Ambulatory Visit (HOSPITAL_COMMUNITY): Payer: Medicare Other

## 2014-02-04 ENCOUNTER — Encounter (HOSPITAL_COMMUNITY): Payer: Medicare Other | Admitting: Vascular Surgery

## 2014-02-04 ENCOUNTER — Ambulatory Visit (HOSPITAL_COMMUNITY)
Admission: RE | Admit: 2014-02-04 | Discharge: 2014-02-05 | Disposition: A | Discharge status: Home / Self Care | Payer: Medicare Other | Source: Ambulatory Visit | Attending: General Surgery | Admitting: General Surgery

## 2014-02-04 ENCOUNTER — Encounter (HOSPITAL_COMMUNITY): Payer: Self-pay | Admitting: *Deleted

## 2014-02-04 ENCOUNTER — Encounter (HOSPITAL_COMMUNITY)
Admission: RE | Disposition: A | Discharge status: Home / Self Care | Payer: Self-pay | Source: Ambulatory Visit | Attending: General Surgery

## 2014-02-04 ENCOUNTER — Ambulatory Visit (HOSPITAL_COMMUNITY): Payer: Medicare Other | Admitting: Anesthesiology

## 2014-02-04 DIAGNOSIS — K219 Gastro-esophageal reflux disease without esophagitis: Secondary | ICD-10-CM | POA: Insufficient documentation

## 2014-02-04 DIAGNOSIS — I10 Essential (primary) hypertension: Secondary | ICD-10-CM | POA: Insufficient documentation

## 2014-02-04 DIAGNOSIS — Z7982 Long term (current) use of aspirin: Secondary | ICD-10-CM | POA: Diagnosis not present

## 2014-02-04 DIAGNOSIS — E119 Type 2 diabetes mellitus without complications: Secondary | ICD-10-CM | POA: Diagnosis not present

## 2014-02-04 DIAGNOSIS — Z87891 Personal history of nicotine dependence: Secondary | ICD-10-CM | POA: Insufficient documentation

## 2014-02-04 DIAGNOSIS — Z23 Encounter for immunization: Secondary | ICD-10-CM | POA: Diagnosis not present

## 2014-02-04 DIAGNOSIS — K8010 Calculus of gallbladder with chronic cholecystitis without obstruction: Secondary | ICD-10-CM | POA: Diagnosis present

## 2014-02-04 DIAGNOSIS — I2510 Atherosclerotic heart disease of native coronary artery without angina pectoris: Secondary | ICD-10-CM | POA: Diagnosis not present

## 2014-02-04 DIAGNOSIS — M1990 Unspecified osteoarthritis, unspecified site: Secondary | ICD-10-CM | POA: Diagnosis not present

## 2014-02-04 DIAGNOSIS — M109 Gout, unspecified: Secondary | ICD-10-CM | POA: Insufficient documentation

## 2014-02-04 DIAGNOSIS — N3944 Nocturnal enuresis: Secondary | ICD-10-CM | POA: Insufficient documentation

## 2014-02-04 DIAGNOSIS — D649 Anemia, unspecified: Secondary | ICD-10-CM | POA: Insufficient documentation

## 2014-02-04 DIAGNOSIS — N289 Disorder of kidney and ureter, unspecified: Secondary | ICD-10-CM | POA: Insufficient documentation

## 2014-02-04 DIAGNOSIS — K8020 Calculus of gallbladder without cholecystitis without obstruction: Secondary | ICD-10-CM | POA: Diagnosis not present

## 2014-02-04 HISTORY — PX: CHOLECYSTECTOMY: SHX55

## 2014-02-04 LAB — GLUCOSE, CAPILLARY
Glucose-Capillary: 161 mg/dL — ABNORMAL HIGH (ref 70–99)
Glucose-Capillary: 126 mg/dL — ABNORMAL HIGH (ref 70–99)

## 2014-02-04 SURGERY — LAPAROSCOPIC CHOLECYSTECTOMY
Anesthesia: General | Site: Abdomen

## 2014-02-04 MED ORDER — SODIUM CHLORIDE 0.9 % IV SOLN
INTRAVENOUS | Status: DC
  Administered 2014-02-04 (×3): via INTRAVENOUS

## 2014-02-04 MED ORDER — ONDANSETRON HCL 4 MG/2ML IJ SOLN
INTRAMUSCULAR | Status: DC | PRN
  Administered 2014-02-04: 4 mg via INTRAVENOUS

## 2014-02-04 MED ORDER — LOSARTAN POTASSIUM 50 MG PO TABS
50.0000 mg | ORAL_TABLET | Freq: Every day | ORAL | Status: DC
  Administered 2014-02-04 – 2014-02-05 (×2): 50 mg via ORAL
  Filled 2014-02-04 (×2): qty 1

## 2014-02-04 MED ORDER — EPHEDRINE SULFATE 50 MG/ML IJ SOLN
INTRAMUSCULAR | Status: DC | PRN
  Administered 2014-02-04: 10 mg via INTRAVENOUS

## 2014-02-04 MED ORDER — BUPIVACAINE-EPINEPHRINE 0.25% -1:200000 IJ SOLN
INTRAMUSCULAR | Status: DC | PRN
  Administered 2014-02-04: 30 mL

## 2014-02-04 MED ORDER — NEOSTIGMINE METHYLSULFATE 1 MG/ML IJ SOLN
INTRAMUSCULAR | Status: DC | PRN
  Administered 2014-02-04: 3 mg via INTRAVENOUS

## 2014-02-04 MED ORDER — HYDROCHLOROTHIAZIDE 10 MG/ML ORAL SUSPENSION
6.2500 mg | Freq: Every day | ORAL | Status: DC
  Administered 2014-02-04 – 2014-02-05 (×2): 6.25 mg via ORAL
  Filled 2014-02-04 (×2): qty 1.25

## 2014-02-04 MED ORDER — MIRTAZAPINE 7.5 MG PO TABS: 7.5000 mg | ORAL_TABLET | Freq: Every day | ORAL | Status: DC | PRN

## 2014-02-04 MED ORDER — ALLOPURINOL 100 MG PO TABS
100.0000 mg | ORAL_TABLET | Freq: Every day | ORAL | Status: DC
  Administered 2014-02-05: 100 mg via ORAL
  Filled 2014-02-04: qty 1

## 2014-02-04 MED ORDER — 0.9 % SODIUM CHLORIDE (POUR BTL) OPTIME
TOPICAL | Status: DC | PRN
  Administered 2014-02-04: 1000 mL

## 2014-02-04 MED ORDER — KCL-LACTATED RINGERS-D5W 20 MEQ/L IV SOLN
INTRAVENOUS | Status: DC
Start: 2014-02-04 — End: 2014-02-05
  Administered 2014-02-04 – 2014-02-05 (×2): via INTRAVENOUS
  Filled 2014-02-04 (×4): qty 1000

## 2014-02-04 MED ORDER — FENTANYL CITRATE 0.05 MG/ML IJ SOLN
INTRAMUSCULAR | Status: AC
  Filled 2014-02-04: qty 5

## 2014-02-04 MED ORDER — GLYCOPYRROLATE 0.2 MG/ML IJ SOLN
INTRAMUSCULAR | Status: DC | PRN
  Administered 2014-02-04: 0.4 mg via INTRAVENOUS

## 2014-02-04 MED ORDER — LOSARTAN POTASSIUM-HCTZ 100-12.5 MG PO TABS: 0.5000 | ORAL_TABLET | Freq: Every day | ORAL | Status: DC

## 2014-02-04 MED ORDER — ONDANSETRON HCL 4 MG/2ML IJ SOLN
INTRAMUSCULAR | Status: AC
  Filled 2014-02-04: qty 2

## 2014-02-04 MED ORDER — HEMOSTATIC AGENTS (NO CHARGE) OPTIME
TOPICAL | Status: DC | PRN
  Administered 2014-02-04: 1 via TOPICAL

## 2014-02-04 MED ORDER — HYDROMORPHONE HCL PF 1 MG/ML IJ SOLN
INTRAMUSCULAR | Status: AC
  Filled 2014-02-04: qty 1

## 2014-02-04 MED ORDER — ROCURONIUM BROMIDE 100 MG/10ML IV SOLN
INTRAVENOUS | Status: DC | PRN
  Administered 2014-02-04: 30 mg via INTRAVENOUS

## 2014-02-04 MED ORDER — BUPIVACAINE-EPINEPHRINE PF 0.25-1:200000 % IJ SOLN
INTRAMUSCULAR | Status: AC
  Filled 2014-02-04: qty 30

## 2014-02-04 MED ORDER — GLYCOPYRROLATE 0.2 MG/ML IJ SOLN
INTRAMUSCULAR | Status: AC
  Filled 2014-02-04: qty 2

## 2014-02-04 MED ORDER — PROPOFOL 10 MG/ML IV BOLUS
INTRAVENOUS | Status: AC
  Filled 2014-02-04: qty 20

## 2014-02-04 MED ORDER — NEOSTIGMINE METHYLSULFATE 1 MG/ML IJ SOLN
INTRAMUSCULAR | Status: AC
  Filled 2014-02-04: qty 10

## 2014-02-04 MED ORDER — ONDANSETRON HCL 4 MG/2ML IJ SOLN: 4.0000 mg | INTRAMUSCULAR | Status: DC | PRN

## 2014-02-04 MED ORDER — METOPROLOL SUCCINATE ER 50 MG PO TB24
50.0000 mg | ORAL_TABLET | Freq: Every day | ORAL | Status: DC
  Administered 2014-02-05: 50 mg via ORAL
  Filled 2014-02-04: qty 1

## 2014-02-04 MED ORDER — PROPOFOL 10 MG/ML IV BOLUS
INTRAVENOUS | Status: DC | PRN
  Administered 2014-02-04: 100 mg via INTRAVENOUS

## 2014-02-04 MED ORDER — ONDANSETRON HCL 4 MG PO TABS: 4.0000 mg | ORAL_TABLET | Freq: Four times a day (QID) | ORAL | Status: DC | PRN

## 2014-02-04 MED ORDER — IOHEXOL 300 MG/ML  SOLN
INTRAMUSCULAR | Status: DC | PRN
  Administered 2014-02-04: 50 mL via INTRAVENOUS

## 2014-02-04 MED ORDER — ROCURONIUM BROMIDE 50 MG/5ML IV SOLN
INTRAVENOUS | Status: AC
  Filled 2014-02-04: qty 1

## 2014-02-04 MED ORDER — FENTANYL CITRATE 0.05 MG/ML IJ SOLN
INTRAMUSCULAR | Status: DC | PRN
  Administered 2014-02-04: 100 ug via INTRAVENOUS
  Administered 2014-02-04: 50 ug via INTRAVENOUS

## 2014-02-04 MED ORDER — LIDOCAINE HCL (CARDIAC) 20 MG/ML IV SOLN
INTRAVENOUS | Status: DC | PRN
  Administered 2014-02-04: 60 mg via INTRAVENOUS

## 2014-02-04 MED ORDER — HYDROCODONE-ACETAMINOPHEN 5-325 MG PO TABS
1.0000 | ORAL_TABLET | ORAL | Status: DC | PRN
  Administered 2014-02-05: 2 via ORAL
  Administered 2014-02-05: 1 via ORAL
  Filled 2014-02-04 (×2): qty 2

## 2014-02-04 MED ORDER — PNEUMOCOCCAL VAC POLYVALENT 25 MCG/0.5ML IJ INJ
0.5000 mL | INJECTION | INTRAMUSCULAR | Status: AC
  Administered 2014-02-05: 0.5 mL via INTRAMUSCULAR
  Filled 2014-02-04: qty 0.5

## 2014-02-04 MED ORDER — HYDROMORPHONE HCL PF 1 MG/ML IJ SOLN
0.2500 mg | INTRAMUSCULAR | Status: DC | PRN
  Administered 2014-02-04 (×4): 0.5 mg via INTRAVENOUS

## 2014-02-04 MED ORDER — LIDOCAINE HCL (CARDIAC) 20 MG/ML IV SOLN
INTRAVENOUS | Status: AC
  Filled 2014-02-04: qty 5

## 2014-02-04 MED ORDER — SODIUM CHLORIDE 0.9 % IR SOLN
Status: DC | PRN
  Administered 2014-02-04: 1000 mL

## 2014-02-04 MED ORDER — MORPHINE SULFATE 2 MG/ML IJ SOLN
2.0000 mg | INTRAMUSCULAR | Status: DC | PRN
  Administered 2014-02-04: 2 mg via INTRAVENOUS
  Filled 2014-02-04: qty 1

## 2014-02-04 SURGICAL SUPPLY — 54 items
APPLIER CLIP 5 13 M/L LIGAMAX5 (MISCELLANEOUS) ×3
BENZOIN TINCTURE PRP APPL 2/3 (GAUZE/BANDAGES/DRESSINGS) ×3 IMPLANT
BLADE SURG ROTATE 9660 (MISCELLANEOUS) ×3 IMPLANT
CANISTER SUCTION 2500CC (MISCELLANEOUS) ×3 IMPLANT
CHLORAPREP W/TINT 26ML (MISCELLANEOUS) ×3 IMPLANT
CLIP APPLIE 5 13 M/L LIGAMAX5 (MISCELLANEOUS) ×1 IMPLANT
CLOSURE WOUND 1/2 X4 (GAUZE/BANDAGES/DRESSINGS) ×1
COVER MAYO STAND STRL (DRAPES) ×3 IMPLANT
COVER SURGICAL LIGHT HANDLE (MISCELLANEOUS) ×3 IMPLANT
DECANTER SPIKE VIAL GLASS SM (MISCELLANEOUS) ×3 IMPLANT
DRAPE C-ARM 42X72 X-RAY (DRAPES) ×3 IMPLANT
DRAPE UTILITY 15X26 W/TAPE STR (DRAPE) ×6 IMPLANT
DRSG TEGADERM 2-3/8X2-3/4 SM (GAUZE/BANDAGES/DRESSINGS) ×3 IMPLANT
ELECT REM PT RETURN 9FT ADLT (ELECTROSURGICAL) ×3
ELECTRODE REM PT RTRN 9FT ADLT (ELECTROSURGICAL) ×1 IMPLANT
GAUZE SPONGE 2X2 8PLY STRL LF (GAUZE/BANDAGES/DRESSINGS) ×1 IMPLANT
GLOVE BIO SURGEON STRL SZ7.5 (GLOVE) ×3 IMPLANT
GLOVE BIO SURGEON STRL SZ8 (GLOVE) ×3 IMPLANT
GLOVE BIOGEL PI IND STRL 6.5 (GLOVE) ×1 IMPLANT
GLOVE BIOGEL PI IND STRL 7.0 (GLOVE) ×1 IMPLANT
GLOVE BIOGEL PI IND STRL 7.5 (GLOVE) ×1 IMPLANT
GLOVE BIOGEL PI IND STRL 8 (GLOVE) ×2 IMPLANT
GLOVE BIOGEL PI INDICATOR 6.5 (GLOVE) ×2
GLOVE BIOGEL PI INDICATOR 7.0 (GLOVE) ×2
GLOVE BIOGEL PI INDICATOR 7.5 (GLOVE) ×2
GLOVE BIOGEL PI INDICATOR 8 (GLOVE) ×4
GLOVE ECLIPSE 6.5 STRL STRAW (GLOVE) ×3 IMPLANT
GLOVE ECLIPSE 7.5 STRL STRAW (GLOVE) ×3 IMPLANT
GLOVE ECLIPSE 8.0 STRL XLNG CF (GLOVE) ×3 IMPLANT
GLOVE SURG SS PI 7.0 STRL IVOR (GLOVE) ×3 IMPLANT
GOWN STRL REUS W/ TWL LRG LVL3 (GOWN DISPOSABLE) ×4 IMPLANT
GOWN STRL REUS W/TWL LRG LVL3 (GOWN DISPOSABLE) ×8
HEMOSTAT SNOW SURGICEL 2X4 (HEMOSTASIS) ×3 IMPLANT
KIT BASIN OR (CUSTOM PROCEDURE TRAY) ×3 IMPLANT
KIT ROOM TURNOVER OR (KITS) ×3 IMPLANT
NS IRRIG 1000ML POUR BTL (IV SOLUTION) ×3 IMPLANT
PAD ARMBOARD 7.5X6 YLW CONV (MISCELLANEOUS) ×6 IMPLANT
POUCH SPECIMEN RETRIEVAL 10MM (ENDOMECHANICALS) ×3 IMPLANT
SCISSORS LAP 5X35 DISP (ENDOMECHANICALS) ×3 IMPLANT
SET CHOLANGIOGRAPH 5 50 .035 (SET/KITS/TRAYS/PACK) ×3 IMPLANT
SET IRRIG TUBING LAPAROSCOPIC (IRRIGATION / IRRIGATOR) ×3 IMPLANT
SLEEVE ENDOPATH XCEL 5M (ENDOMECHANICALS) ×6 IMPLANT
SPECIMEN JAR SMALL (MISCELLANEOUS) ×3 IMPLANT
SPONGE GAUZE 2X2 STER 10/PKG (GAUZE/BANDAGES/DRESSINGS) ×2
STRIP CLOSURE SKIN 1/2X4 (GAUZE/BANDAGES/DRESSINGS) ×2 IMPLANT
SUT MON AB 4-0 PC3 18 (SUTURE) ×3 IMPLANT
TOWEL OR 17X24 6PK STRL BLUE (TOWEL DISPOSABLE) ×3 IMPLANT
TOWEL OR 17X26 10 PK STRL BLUE (TOWEL DISPOSABLE) ×3 IMPLANT
TRAY LAPAROSCOPIC (CUSTOM PROCEDURE TRAY) ×3 IMPLANT
TROCAR BLADELESS 11MM (ENDOMECHANICALS) ×3 IMPLANT
TROCAR XCEL BLUNT TIP 100MML (ENDOMECHANICALS) ×3 IMPLANT
TROCAR XCEL NON-BLD 11X100MML (ENDOMECHANICALS) IMPLANT
TROCAR XCEL NON-BLD 5MMX100MML (ENDOMECHANICALS) ×3 IMPLANT
WATER STERILE IRR 1000ML POUR (IV SOLUTION) IMPLANT

## 2014-02-04 NOTE — Anesthesia Procedure Notes (Signed)
Procedure Name: Intubation Date/Time: 02/04/2014 9:10 AM Performed by: Julian Reil Pre-anesthesia Checklist: Patient identified, Emergency Drugs available, Suction available and Patient being monitored Patient Re-evaluated:Patient Re-evaluated prior to inductionOxygen Delivery Method: Circle system utilized Preoxygenation: Pre-oxygenation with 100% oxygen Intubation Type: IV induction Ventilation: Mask ventilation without difficulty Laryngoscope Size: Mac and 4 Grade View: Grade I Tube type: Oral Tube size: 7.5 mm Number of attempts: 1 Airway Equipment and Method: Stylet Placement Confirmation: ETT inserted through vocal cords under direct vision,  positive ETCO2 and breath sounds checked- equal and bilateral Secured at: 22 cm Tube secured with: Tape Dental Injury: Teeth and Oropharynx as per pre-operative assessment

## 2014-02-04 NOTE — Anesthesia Postprocedure Evaluation (Signed)
  Anesthesia Post-op Note  Patient: Gerald Jacobs  Procedure(s) Performed: Procedure(s): LAPAROSCOPIC CHOLECYSTECTOMY with intraoperative cholangiogram (N/A)  Patient Location: PACU  Anesthesia Type:General  Level of Consciousness: awake and alert   Airway and Oxygen Therapy: Patient Spontanous Breathing  Post-op Pain: mild  Post-op Assessment: Post-op Vital signs reviewed, Patient's Cardiovascular Status Stable and Respiratory Function Stable  Post-op Vital Signs: Reviewed  Filed Vitals:   02/04/14 1100  BP: 172/59  Pulse: 66  Temp:   Resp: 9    Complications: No apparent anesthesia complications

## 2014-02-04 NOTE — Transfer of Care (Signed)
Immediate Anesthesia Transfer of Care Note  Patient: Gerald Jacobs  Procedure(s) Performed: Procedure(s): LAPAROSCOPIC CHOLECYSTECTOMY with intraoperative cholangiogram (N/A)  Patient Location: PACU  Anesthesia Type:General  Level of Consciousness: awake, alert , oriented and patient cooperative  Airway & Oxygen Therapy: Patient Spontanous Breathing and Patient connected to nasal cannula oxygen  Post-op Assessment: Report given to PACU RN, Post -op Vital signs reviewed and stable and Patient moving all extremities  Post vital signs: Reviewed and stable  Complications: No apparent anesthesia complications

## 2014-02-04 NOTE — Discharge Instructions (Signed)
[facility] ______[facility] LAPAROSCOPIC SURGERY: POST OP INSTRUCTIONS Always review your discharge instruction sheet given to you by the facility where your surgery was performed. IF YOU HAVE DISABILITY OR FAMILY LEAVE FORMS, YOU MUST BRING THEM TO THE OFFICE FOR PROCESSING.   DO NOT GIVE THEM TO YOUR DOCTOR.  1. A prescription for pain medication may be given to you upon discharge.  Take your pain medication as prescribed, if needed.  If narcotic pain medicine is not needed, then you may take acetaminophen (Tylenol) or ibuprofen (Advil) as needed. 2. Take your usually prescribed medications unless otherwise directed. 3. If you need a refill on your pain medication, please contact your pharmacy.  They will contact our office to request authorization. Prescriptions will not be filled after 5pm or on week-ends. 4. You should follow a light diet the first few days after arrival home, such as soup and crackers, etc.  Be sure to include lots of fluids daily. 5. Most patients will experience some swelling and bruising in the area of the incisions.  Ice packs will help.  Swelling and bruising can take several days to resolve.  6. It is common to experience some constipation if taking pain medication after surgery.  Increasing fluid intake and taking a stool softener (such as Colace) will usually help or prevent this problem from occurring.  A mild laxative (Milk of Magnesia or Miralax) should be taken according to package instructions if there are no bowel movements after 48 hours. 7. Unless discharge instructions indicate otherwise, you may remove your bandages 72 hours after surgery, and you may shower at that time.  You may have steri-strips (small skin tapes) in place directly over the incision.  These strips should be left on the skin for 14 days.  If your surgeon used skin glue on the incision, you may shower in 24 hours.  The glue will flake off over the next 2-3 weeks.  Any sutures or  staples will be removed at the office during your follow-up visit. 8. ACTIVITIES:  You may resume regular (light) daily activities beginning the next day--such as daily self-care, walking, climbing stairs--gradually increasing activities as tolerated.  You may have sexual intercourse when it is comfortable.  Refrain from any heavy lifting or straining-nothing over 10 pounds for 2 weeks.  a. You may drive when you are no longer taking prescription pain medication, you can comfortably wear a seatbelt, and you can safely maneuver your car and apply brakes. b. RETURN TO WORK:  __________________________________________________________ 9. You should see your doctor in the office for a follow-up appointment approximately 2-3 weeks after your surgery.  Make sure that you call for this appointment within a day or two after you arrive home to insure a convenient appointment time. 10. OTHER INSTRUCTIONS: __________________________________________________________________________________________________________________________ __________________________________________________________________________________________________________________________ WHEN TO CALL YOUR DOCTOR: 1. Fever over 101.0 2. Inability to urinate 3. Continued bleeding from incision. 4. Increased pain, redness, or drainage from the incision. 5. Increasing abdominal pain  The clinic staff is available to answer your questions during regular business hours.  Please dont hesitate to call and ask to speak to one of the nurses for clinical concerns.  If you have a medical emergency, go to the nearest emergency room or call 911.  A surgeon from [facility] is always on call at the hospital. [address], [place], [place], [place]  [address] ? [address], [place], [place]   [address] [phone] ? [phone] ? FAX [phone]  Web site: [email or web address]

## 2014-02-04 NOTE — Op Note (Signed)
Preoperative diagnosis:  Symptomatic cholelithiasis  Postoperative diagnosis:  Same  Procedure: Laparoscopic cholecystectomy with cholangiogram.  Surgeon: Jackolyn Confer, M.D.  Asst.:  Neysa Bonito M.D.  Anesthesia: General  Indication:   This is an 80 year old male with biliary colic type pain. He is noted to have gallstones. There is concern for choledocholithiasis and ERCP was done and sludge was removed from the common bile duct. He is interested in proceeding with cholecystectomy and presents for that.  Technique: He was brought to the operating room, placed supine on the operating table, and a general anesthetic was administered. The hair on the abdominal wall was clipped as was necessary. The abdominal wall was then sterilely prepped and draped. Local anesthetic (Marcaine) was infiltrated in the subumbilical region.  A small incision was made in the right upper quadrant. Using a 5 mm Optiview trocar and laparoscope I gained access into the peritoneal cavity. Inspection of the site beneath a trocar demonstrated no evidence of organ injury or bleeding. The pneumoperitoneum was created.  There were adhesions between the omentum and small bowel in the lower midline. The immediate subumbilical area was free of adhesions. An 11 mm incision was made in the subumbilical region and an 11 mm trocar was placed into the abdominal cavity under laparoscopic vision. He was then placed in the reverse Trendelenburg position with the right side tilted slightly up.  Two more 5 mm trocars were then placed into the abdominal cavity, under laparoscopic vision,  in the right upper quadrant area. The gallbladder was visualized and the fundus was grasped and retracted toward the right shoulder.  Chronic inflammatory changes were noted.  The infundibulum was mobilized with dissection close to the gallbladder and retracted laterally. The cystic duct was identified and a window was created around it. The cystic artery  was also identified and a window was created around it. The critical view was achieved. A clip was placed at the neck of the gallbladder. A small incision was made in the cystic duct. A cholangiocatheter was introduced through the anterior abdominal wall and placed in the cystic duct. A intraoperative cholangiogram was then performed.  Under real-time fluoroscopy, dilute contrast was injected into the cystic duct.  The common hepatic duct, the right and left hepatic ducts, and the common duct were all visualized. Contrast drained into the duodenum without obvious evidence of any obstructing ductal lesion. The final report is pending the Radiologist's interpretation.  The cholangiocatheter was removed, the cystic duct was clipped 3 times on the biliary side, and then the cystic duct was divided sharply. No bile leak was noted from the cystic duct stump.  The cystic artery was then clipped and divided. Following this the gallbladder was dissected free from the liver using electrocautery. The gallbladder was then placed in a retrieval bag and removed from the abdominal cavity through the subumbilical incision.  The gallbladder fossa was inspected, irrigated, and bleeding was controlled with electrocautery. Inspection showed that hemostasis was adequate and there was no evidence of bile leak.  A piece of Surgicel was placed in the gallbladder fossa.  The irrigation fluid was evacuated as much as possible.  The  trocars were removed and the pneumoperitoneum was released. The skin incisions were closed with 4-0 Monocryl subcuticular stitches. Steri-Strips and sterile dressings were applied.  The procedure was well-tolerated without any apparent complications. He was taken to the recovery room in satisfactory condition.

## 2014-02-04 NOTE — H&P (View-Only) (Signed)
Patient ID: Gerald Jacobs, male   DOB: 1925/03/09, 80 y.o.   MRN: [id number]  Chief Complaint  Patient presents with  . Cholelithiasis    HPI Gerald Jacobs is a 80 y.o. male.   HPI  He is referred by Dr. Mare Ferrari for further evaluation and treatment of symptomatic cholelithiasis. In June of 2014 he had a  case of biliary colic. He had some epigastric pain, nausea and vomiting and distention. He was evaluated and noted to have gallstones with a concern for choledocholithiasis as well. In November of 2014 he underwent ERCP and some sludge was removed from the bile duct. A sphincterotomy was performed. He now presents to discuss laparoscopic cholecystectomy. He has not had any significant attacks since the ERCP.  He is here with his wife.  Past Medical History  Diagnosis Date  . Essential hypertension   . Mild renal insufficiency   . Diabetes mellitus     Adult onset  . Chronic anemia     With normal iron studies and normal serum protein electrophoresis  . Coronary artery disease     Mild  . History of gout   . Nocturia     x2  . Osteoarthritis     Past Surgical History  Procedure Laterality Date  . Knee surgery  1979  . Prostate surgery  1993    Prostate Cancer  . Melanoma surgery  1970's    on scalp  . Ercp N/A 10/15/2013    Procedure: ENDOSCOPIC RETROGRADE CHOLANGIOPANCREATOGRAPHY (ERCP);  Surgeon: Jeryl Columbia, MD;  Location: [facility];  Service: Endoscopy;  Laterality: N/A;  . Sphincterotomy  10/15/2013    Procedure: SPHINCTEROTOMY;  Surgeon: Jeryl Columbia, MD;  Location: [facility];  Service: Endoscopy;;    Family History  Problem Relation Age of Onset  . Stroke Father   . Stroke Mother   . Stroke Sister   . Stroke Brother   . Stroke Brother   . Stroke Brother     Social History History  Substance Use Topics  . Smoking status: Former Smoker -- 1.00 packs/day for 45 years    Types: Cigarettes    Quit date: 09/07/1979  . Smokeless tobacco: Never Used   . Alcohol Use: Yes     Comment: beer rarely    Allergies  Allergen Reactions  . Colchicine     GI problems  . Flexeril [Cyclobenzaprine Hcl]     GI problems   . Percocet [Oxycodone-Acetaminophen] Nausea And Vomiting  . Zebeta     Gi problems    Current Outpatient Prescriptions  Medication Sig Dispense Refill  . allopurinol (ZYLOPRIM) 100 MG tablet TAKE 1 TABLET DAILY  90 tablet  1  . aspirin 81 MG tablet Take 81 mg by mouth daily.        Marland Kitchen atorvastatin (LIPITOR) 10 MG tablet Take 10 mg by mouth daily at 6 PM.      . Biotin 1000 MCG tablet Take 1,000 mcg by mouth daily.      . diphenhydramine-acetaminophen (TYLENOL PM) 25-500 MG TABS Take 1 tablet by mouth at bedtime as needed (sleep).      . losartan-hydrochlorothiazide (HYZAAR) 100-12.5 MG per tablet Take 0.5 tablets by mouth as directed.       . metoprolol succinate (TOPROL-XL) 50 MG 24 hr tablet Take 50 mg by mouth daily. Take with or immediately following a meal.      . mirtazapine (REMERON) 7.5 MG tablet Take 7.5 mg by mouth as  needed (apetite).       . sitaGLIPtin (JANUVIA) 50 MG tablet Take 50 mg by mouth daily.       No current facility-administered medications for this visit.    Review of Systems Review of Systems  Constitutional: Negative.   Respiratory: Negative.   Cardiovascular: Negative.   Gastrointestinal: Negative.   Genitourinary: Negative.   Hematological: Negative.     Blood pressure 148/62, pulse 68, resp. rate 20, height 5\' 8"  (1.727 m), weight 149 lb 9.6 oz (67.858 kg).  Physical Exam Physical Exam  Constitutional: He appears well-developed and well-nourished. No distress.  HENT:  Head: Normocephalic and atraumatic.  Eyes: No scleral icterus.  Wears glasses.  Cardiovascular: Normal rate and regular rhythm.   Pulmonary/Chest: Effort normal and breath sounds normal.  Abdominal: Soft. He exhibits no distension and no mass. There is no tenderness.  Lower midline scar  Musculoskeletal: He  exhibits no edema.  Neurological: He is alert.  Skin: Skin is warm and dry.  Psychiatric: He has a normal mood and affect. His behavior is normal.    Data Reviewed Ultrasound an MRCP report. ERCP report. Note from Dr. Mare Ferrari.  Assessment    Symptomatic cholelithiasis. We discussed nonoperative and operative options. He would like to proceed with cholecystectomy.     Plan    Laparoscopic cholecystectomy with cholangiogram.  I have explained the procedure, risks, and aftercare of cholecystectomy.  Risks include but are not limited to bleeding, infection, wound problems, anesthesia, diarrhea, bile leak, injury to common bile duct/liver/intestine, death.  He seems to understand and agrees to proceed.         Adden Strout J 01/16/2014, 10:30 AM

## 2014-02-04 NOTE — Anesthesia Preprocedure Evaluation (Signed)
Anesthesia Evaluation  Patient identified by MRN, date of birth, ID band Patient awake    Reviewed: Allergy & Precautions, H&P , NPO status , Patient's Chart, lab work & pertinent test results, reviewed documented beta blocker date and time   Airway Mallampati: II TM Distance: >3 FB Neck ROM: Full    Dental no notable dental hx. (+) Edentulous Upper, Dental Advisory Given   Pulmonary neg pulmonary ROS, former smoker,  breath sounds clear to auscultation  Pulmonary exam normal       Cardiovascular On Medications and On Home Beta Blockers negative cardio ROS  Rhythm:Regular Rate:Normal     Neuro/Psych negative neurological ROS  negative psych ROS   GI/Hepatic Neg liver ROS, GERD-  Medicated and Controlled,  Endo/Other  diabetes, Type 2, Oral Hypoglycemic Agents  Renal/GU Renal disease  negative genitourinary   Musculoskeletal   Abdominal   Peds  Hematology negative hematology ROS (+) anemia ,   Anesthesia Other Findings   Reproductive/Obstetrics negative OB ROS                           Anesthesia Physical Anesthesia Plan  ASA: III  Anesthesia Plan: General   Post-op Pain Management:    Induction: Intravenous  Airway Management Planned: Oral ETT  Additional Equipment:   Intra-op Plan:   Post-operative Plan: Extubation in OR  Informed Consent: I have reviewed the patients History and Physical, chart, labs and discussed the procedure including the risks, benefits and alternatives for the proposed anesthesia with the patient or authorized representative who has indicated his/her understanding and acceptance.   Dental advisory given  Plan Discussed with: CRNA  Anesthesia Plan Comments:         Anesthesia Quick Evaluation

## 2014-02-04 NOTE — Interval H&P Note (Signed)
History and Physical Interval Note:  02/04/2014 8:53 AM  Gerald Jacobs  has presented today for surgery, with the diagnosis of cholelithiasis  The various methods of treatment have been discussed with the patient and family. After consideration of risks, benefits and other options for treatment, the patient has consented to  Procedure(s): LAPAROSCOPIC CHOLECYSTECTOMY (N/A) as a surgical intervention .  The patient's history has been reviewed, patient examined, no change in status, stable for surgery.  I have reviewed the patient's chart and labs.  Questions were answered to the patient's satisfaction.     Jalasia Eskridge Lenna Sciara

## 2014-02-04 NOTE — Preoperative (Signed)
Beta Blockers   Reason not to administer Beta Blockers:Not Applicable 

## 2014-02-05 MED ORDER — HYDROCODONE-ACETAMINOPHEN 5-325 MG PO TABS: 1.0000 | ORAL_TABLET | ORAL | Status: DC | PRN

## 2014-02-05 NOTE — Progress Notes (Signed)
Patient discharged to home with instructions. 

## 2014-02-05 NOTE — Progress Notes (Signed)
1 Day Post-Op  Subjective: A little sore.  Voiding.  Tolerating liquids.  Objective: Vital signs in last 24 hours: Temp:  [97.3 F (36.3 C)-99.9 F (37.7 C)] 98.2 F (36.8 C) (03/11 0530) Pulse Rate:  [65-89] 81 (03/11 0530) Resp:  [9-21] 18 (03/11 0530) BP: (123-172)/(42-69) 132/45 mmHg (03/11 0530) SpO2:  [95 %-100 %] 100 % (03/11 0530) Weight:  [151 lb 4.5 oz (68.62 kg)] 151 lb 4.5 oz (68.62 kg) (03/10 2300) Last BM Date: 02/03/14  Intake/Output from previous day: 03/10 0701 - 03/11 0700 In: 3185 [P.O.:610; I.V.:2575] Out: 160 [Urine:160] Intake/Output this shift:    PE: General- In NAD Abdomen-soft, dressings dry  Lab Results:  No results found for this basename: WBC, HGB, HCT, PLT,  in the last 72 hours BMET No results found for this basename: NA, K, CL, CO2, GLUCOSE, BUN, CREATININE, CALCIUM,  in the last 72 hours PT/INR No results found for this basename: LABPROT, INR,  in the last 72 hours Comprehensive Metabolic Panel:    Component Value Date/Time   NA 142 01/28/2014 1035   NA 144 01/28/2014 0918   K 4.6 01/28/2014 1035   K 4.5 01/28/2014 0918   CL 106 01/28/2014 1035   CL 107 01/28/2014 0918   CO2 23 01/28/2014 1035   CO2 24 01/28/2014 0918   BUN 38* 01/28/2014 1035   BUN 39* 01/28/2014 0918   CREATININE 1.97* 01/28/2014 1035   CREATININE 2.09* 01/28/2014 0918   CREATININE 2.11* 05/20/2013 1412   GLUCOSE 152* 01/28/2014 1035   GLUCOSE 188* 01/28/2014 0918   CALCIUM 9.2 01/28/2014 1035   CALCIUM 9.1 01/28/2014 0918   CALCIUM 8.8 03/26/2013 0944   CALCIUM 8.3* 03/14/2012 0832   AST 24 01/28/2014 1035   AST 49* 10/16/2013 0020   ALT 21 01/28/2014 1035   ALT 33 10/16/2013 0020   ALKPHOS 68 01/28/2014 1035   ALKPHOS 93 10/16/2013 0020   BILITOT 0.3 01/28/2014 1035   BILITOT 0.9 10/16/2013 0020   PROT 7.5 01/28/2014 1035   PROT 7.6 10/16/2013 0020   ALBUMIN 3.6 01/28/2014 1035   ALBUMIN 3.7 01/28/2014 [id number]     Studies/Results: Dg Cholangiogram Operative  02/04/2014   CLINICAL DATA:   Cholelithiasis  EXAM: INTRAOPERATIVE CHOLANGIOGRAM  TECHNIQUE: Cholangiographic images from the C-arm fluoroscopic device were submitted for interpretation post-operatively. Please see the procedural report for the amount of contrast and the fluoroscopy time utilized.  COMPARISON:  None.  FINDINGS: No persistent filling defects in the common duct. Intrahepatic ducts are incompletely visualized, appearing decompressed centrally. Contrast passes into the duodenum.  : Negative for retained common duct stone.   Electronically Signed   By: Arne Cleveland M.D.   On: 02/04/2014 10:48    Anti-infectives: Anti-infectives   Start     Dose/Rate Route Frequency Ordered Stop   02/04/14 0600  ceFAZolin (ANCEF) IVPB 2 g/50 mL premix     2 g 100 mL/hr over 30 Minutes Intravenous On call to O.R. 02/03/14 1451 02/04/14 0916      Assessment Active Problems:   Chronic cholecystitis with calculus-doing well    LOS: 1 day   Plan: Discharge.  Instructions given.   Kaydi Kley J 02/05/2014

## 2014-02-05 NOTE — Discharge Summary (Signed)
Physician Discharge Summary  Patient ID: Gerald Jacobs MRN: [id number] DOB/AGE: [birth year] 80 y.o.  Admit date: 02/04/2014 Discharge date: 02/05/2014  Admission Diagnoses:  Chronic calculous cholecystitis  Discharge Diagnoses:  Active Problems:   Chronic cholecystitis with calculus   Discharged Condition: good  Hospital Course: He underwent laparoscopic cholecystectomy with IOC (no CBD stones) and tolerated it well.  He was discharge on POD #1.  Discharge instructions were given to him and his family.  Consults: None  Significant Diagnostic Studies: none  Treatments: surgery: Laparoscopic cholecystectomy with IOC   Disposition: 01-Home or Self Care   Future Appointments Provider Department Dept Phone   02/18/2014 9:00 AM [facility] [place] [phone]   02/19/2014 9:10 AM Odis Hollingshead, MD [facility] Surgery, [place] [phone]   03/25/2014 9:00 AM [facility] [place] [phone]   03/25/2014 9:30 AM Darlin Coco, MD [facility] Office [phone]       Medication List         allopurinol 100 MG tablet  Commonly known as:  ZYLOPRIM  Take 100 mg by mouth daily.     aspirin 81 MG tablet  Take 81 mg by mouth daily.     atorvastatin 10 MG tablet  Commonly known as:  LIPITOR  Take 10 mg by mouth daily at 6 PM.     Biotin 1000 MCG tablet  Take 1,000 mcg by mouth daily.     diphenhydramine-acetaminophen 25-500 MG Tabs  Commonly known as:  TYLENOL PM  Take 1 tablet by mouth at bedtime as needed (sleep).     HYDROcodone-acetaminophen 5-325 MG per tablet  Commonly known as:  NORCO/VICODIN  Take 1-2 tablets by mouth every 4 (four) hours as needed for moderate pain.     losartan-hydrochlorothiazide 100-12.5 MG per tablet  Commonly known as:  HYZAAR  Take 0.5 tablets by mouth daily.     metoprolol succinate 50 MG 24 hr tablet  Commonly known as:  TOPROL-XL  Take 50  mg by mouth daily. Take with or immediately following a meal.     mirtazapine 7.5 MG tablet  Commonly known as:  REMERON  Take 7.5 mg by mouth daily as needed (apetite).     sitaGLIPtin 50 MG tablet  Commonly known as:  JANUVIA  Take 50 mg by mouth daily.         Signed: Odis Hollingshead 02/05/2014, 8:14 AM

## 2014-02-06 ENCOUNTER — Encounter (HOSPITAL_COMMUNITY): Payer: Self-pay | Admitting: General Surgery

## 2014-02-10 ENCOUNTER — Telehealth: Payer: Self-pay

## 2014-02-10 MED ORDER — SITAGLIPTIN PHOSPHATE 50 MG PO TABS: 50.0000 mg | ORAL_TABLET | Freq: Every day | ORAL | Status: DC

## 2014-02-10 NOTE — Telephone Encounter (Signed)
[facility]

## 2014-02-18 ENCOUNTER — Encounter (HOSPITAL_COMMUNITY)
Admission: RE | Admit: 2014-02-18 | Discharge: 2014-02-18 | Disposition: A | Discharge status: Home / Self Care | Payer: Medicare Other | Source: Ambulatory Visit | Attending: Nephrology | Admitting: Nephrology

## 2014-02-18 DIAGNOSIS — N183 Chronic kidney disease, stage 3 unspecified: Secondary | ICD-10-CM | POA: Diagnosis not present

## 2014-02-18 DIAGNOSIS — D638 Anemia in other chronic diseases classified elsewhere: Secondary | ICD-10-CM | POA: Diagnosis not present

## 2014-02-18 LAB — POCT HEMOGLOBIN-HEMACUE: Hemoglobin: 10.1 g/dL — ABNORMAL LOW (ref 13.0–17.0)

## 2014-02-18 MED ORDER — EPOETIN ALFA 20000 UNIT/ML IJ SOLN
INTRAMUSCULAR | Status: AC
  Administered 2014-02-18: 20000 [IU] via SUBCUTANEOUS
  Filled 2014-02-18: qty 1

## 2014-02-18 MED ORDER — EPOETIN ALFA 10000 UNIT/ML IJ SOLN: 20000.0000 [IU] | INTRAMUSCULAR | Status: DC

## 2014-02-19 ENCOUNTER — Ambulatory Visit (INDEPENDENT_AMBULATORY_CARE_PROVIDER_SITE_OTHER): Payer: Medicare Other | Admitting: General Surgery

## 2014-02-19 VITALS — BP 126/80 | HR 74 | Temp 98.0°F | Ht 68.0 in | Wt 148.0 lb

## 2014-02-19 DIAGNOSIS — Z4889 Encounter for other specified surgical aftercare: Secondary | ICD-10-CM

## 2014-02-19 NOTE — Patient Instructions (Signed)
Low fat diet, activities as tolerated.

## 2014-02-19 NOTE — Progress Notes (Signed)
He is here for a postop visit following laparoscopic cholecystectomy.  He does not have much of an appetite because food does not taste good to him. He is having some epigastric soreness. He is been having some night sweats but no fever.  PE:  ABD:  Soft, incisions clean/dry/intact and solid.  Assessment:  Progressing slowly adequately postoperatively. Suspect his lack of appetite and night sweats are a side effect of the anesthesia.  Plan:  Lowfat diet recommended.  Activities as tolerated.  Return visit prn.  I assured him that typically the appetite returns and the night sweats will go away.

## 2014-03-10 ENCOUNTER — Other Ambulatory Visit: Payer: Self-pay | Admitting: Dermatology

## 2014-03-10 DIAGNOSIS — D239 Other benign neoplasm of skin, unspecified: Secondary | ICD-10-CM | POA: Diagnosis not present

## 2014-03-10 DIAGNOSIS — L819 Disorder of pigmentation, unspecified: Secondary | ICD-10-CM | POA: Diagnosis not present

## 2014-03-10 DIAGNOSIS — D1801 Hemangioma of skin and subcutaneous tissue: Secondary | ICD-10-CM | POA: Diagnosis not present

## 2014-03-10 DIAGNOSIS — L570 Actinic keratosis: Secondary | ICD-10-CM | POA: Diagnosis not present

## 2014-03-10 DIAGNOSIS — Z85820 Personal history of malignant melanoma of skin: Secondary | ICD-10-CM | POA: Diagnosis not present

## 2014-03-10 DIAGNOSIS — L821 Other seborrheic keratosis: Secondary | ICD-10-CM | POA: Diagnosis not present

## 2014-03-10 DIAGNOSIS — Z85828 Personal history of other malignant neoplasm of skin: Secondary | ICD-10-CM | POA: Diagnosis not present

## 2014-03-11 ENCOUNTER — Encounter (HOSPITAL_COMMUNITY)
Admission: RE | Admit: 2014-03-11 | Discharge: 2014-03-11 | Disposition: A | Discharge status: Home / Self Care | Payer: Medicare Other | Source: Ambulatory Visit | Attending: Nephrology | Admitting: Nephrology

## 2014-03-11 DIAGNOSIS — N183 Chronic kidney disease, stage 3 unspecified: Secondary | ICD-10-CM | POA: Insufficient documentation

## 2014-03-11 DIAGNOSIS — D638 Anemia in other chronic diseases classified elsewhere: Secondary | ICD-10-CM | POA: Insufficient documentation

## 2014-03-11 LAB — RENAL FUNCTION PANEL
Albumin: 3.4 g/dL — ABNORMAL LOW (ref 3.5–5.2)
BUN: 34 mg/dL — ABNORMAL HIGH (ref 6–23)
CHLORIDE: 103 meq/L (ref 96–112)
CO2: 22 mEq/L (ref 19–32)
Calcium: 9 mg/dL (ref 8.4–10.5)
Creatinine, Ser: 1.92 mg/dL — ABNORMAL HIGH (ref 0.50–1.35)
GFR calc Af Amer: 35 mL/min — ABNORMAL LOW (ref >90)
GFR calc non Af Amer: 30 mL/min — ABNORMAL LOW (ref >90)
GLUCOSE: 193 mg/dL — AB (ref 70–99)
PHOSPHORUS: 2.6 mg/dL (ref 2.3–4.6)
POTASSIUM: 4.5 meq/L (ref 3.7–5.3)
SODIUM: 140 meq/L (ref 137–147)

## 2014-03-11 LAB — IRON AND TIBC
Iron: 104 ug/dL (ref 42–135)
SATURATION RATIOS: 43 % (ref 20–55)
TIBC: 243 ug/dL (ref 215–435)
UIBC: 139 ug/dL (ref 125–400)

## 2014-03-11 LAB — POCT HEMOGLOBIN-HEMACUE: HEMOGLOBIN: 10.3 g/dL — AB (ref 13.0–17.0)

## 2014-03-11 LAB — FERRITIN: Ferritin: 223 ng/mL (ref 22–322)

## 2014-03-11 MED ORDER — EPOETIN ALFA 10000 UNIT/ML IJ SOLN: 20000.0000 [IU] | INTRAMUSCULAR | Status: DC

## 2014-03-11 MED ORDER — EPOETIN ALFA 20000 UNIT/ML IJ SOLN
INTRAMUSCULAR | Status: AC
  Administered 2014-03-11: 20000 [IU] via SUBCUTANEOUS
  Filled 2014-03-11: qty 1

## 2014-03-25 ENCOUNTER — Other Ambulatory Visit: Payer: Medicare Other

## 2014-03-25 ENCOUNTER — Ambulatory Visit (INDEPENDENT_AMBULATORY_CARE_PROVIDER_SITE_OTHER): Payer: Medicare Other | Admitting: Cardiology

## 2014-03-25 ENCOUNTER — Encounter: Payer: Self-pay | Admitting: Cardiology

## 2014-03-25 VITALS — BP 142/62 | HR 61 | Ht 68.0 in | Wt 144.0 lb

## 2014-03-25 DIAGNOSIS — N183 Chronic kidney disease, stage 3 unspecified: Secondary | ICD-10-CM | POA: Diagnosis not present

## 2014-03-25 DIAGNOSIS — K521 Toxic gastroenteritis and colitis: Secondary | ICD-10-CM

## 2014-03-25 DIAGNOSIS — I119 Hypertensive heart disease without heart failure: Secondary | ICD-10-CM

## 2014-03-25 DIAGNOSIS — IMO0001 Reserved for inherently not codable concepts without codable children: Secondary | ICD-10-CM | POA: Diagnosis not present

## 2014-03-25 DIAGNOSIS — E1165 Type 2 diabetes mellitus with hyperglycemia: Secondary | ICD-10-CM

## 2014-03-25 DIAGNOSIS — E785 Hyperlipidemia, unspecified: Secondary | ICD-10-CM

## 2014-03-25 DIAGNOSIS — R197 Diarrhea, unspecified: Secondary | ICD-10-CM

## 2014-03-25 DIAGNOSIS — E7800 Pure hypercholesterolemia, unspecified: Secondary | ICD-10-CM

## 2014-03-25 DIAGNOSIS — T50904A Poisoning by unspecified drugs, medicaments and biological substances, undetermined, initial encounter: Secondary | ICD-10-CM

## 2014-03-25 LAB — LIPID PANEL
CHOLESTEROL: 106 mg/dL (ref 0–200)
HDL: 46.9 mg/dL (ref >39.00)
LDL Cholesterol: 28 mg/dL (ref 0–99)
Total CHOL/HDL Ratio: 2
Triglycerides: 156 mg/dL — ABNORMAL HIGH (ref 0.0–149.0)
VLDL: 31.2 mg/dL (ref 0.0–40.0)

## 2014-03-25 LAB — BASIC METABOLIC PANEL
BUN: 40 mg/dL — ABNORMAL HIGH (ref 6–23)
CO2: 21 meq/L (ref 19–32)
Calcium: 9.2 mg/dL (ref 8.4–10.5)
Chloride: 107 mEq/L (ref 96–112)
Creatinine, Ser: 2.1 mg/dL — ABNORMAL HIGH (ref 0.4–1.5)
GFR: 31.27 mL/min — ABNORMAL LOW (ref >60.00)
Glucose, Bld: 137 mg/dL — ABNORMAL HIGH (ref 70–99)
Potassium: 5 mEq/L (ref 3.5–5.1)
SODIUM: 137 meq/L (ref 135–145)

## 2014-03-25 LAB — HEPATIC FUNCTION PANEL
ALK PHOS: 67 U/L (ref 39–117)
ALT: 33 U/L (ref 0–53)
AST: 22 U/L (ref 0–37)
Albumin: 3.9 g/dL (ref 3.5–5.2)
Bilirubin, Direct: 0.1 mg/dL (ref 0.0–0.3)
Total Bilirubin: 0.6 mg/dL (ref 0.3–1.2)
Total Protein: 7.5 g/dL (ref 6.0–8.3)

## 2014-03-25 NOTE — Assessment & Plan Note (Signed)
Bowel habits are now back to normal.  No diarrhea or constipation.  No melena or hematochezia

## 2014-03-25 NOTE — Assessment & Plan Note (Signed)
No chest pain or shortness of breath.  He is getting exercise again.  [facility] and also has been playing golf.

## 2014-03-25 NOTE — Patient Instructions (Signed)
Will obtain labs today and call you with the results (lp/bmet/hfp)  Your physician recommends that you continue on your current medications as directed. Please refer to the Current Medication list given to you today.  Your physician wants you to follow-up in: 4 months with fasting labs (lp/bmet/hfp) and ekg You will receive a reminder letter in the mail two months in advance. If you don't receive a letter, please call our office to schedule the follow-up appointment.  

## 2014-03-25 NOTE — Assessment & Plan Note (Signed)
Patient has moderate renal insufficiency.  His nephrologist has him on Januvia for his diabetes.

## 2014-03-25 NOTE — Progress Notes (Signed)
Gerald Jacobs Date of Birth:  1925/10/05 [place] [address] [place], [place]  [address] [phone]        Fax   [phone]   History of Present Illness:  This pleasant 80 year old gentleman is seen for a scheduled followup office visit. He has a past history of essential hypertension. He also has a history of dyslipidemia, diabetes, and mild renal insufficiency. Also has a history of hyperuricemia. He has anemia secondary to chronic renal disease and receives ejections of Procrit about every 3 weeks depending on his hemoglobin level. Dr. Posey Pronto is his nephrologist. Over the course of the summer he developed signs and symptoms of cholecystitis .  He recently underwent successful laparoscopic cholecystectomy.  His appetite is gradually improving.  His weight is still down 5 pounds from previously.   Current Outpatient Prescriptions  Medication Sig Dispense Refill  . allopurinol (ZYLOPRIM) 100 MG tablet Take 100 mg by mouth daily.      Marland Kitchen aspirin 81 MG tablet Take 81 mg by mouth daily.        Marland Kitchen atorvastatin (LIPITOR) 10 MG tablet Take 10 mg by mouth daily at 6 PM.      . Biotin 1000 MCG tablet Take 1,000 mcg by mouth daily.      . diphenhydramine-acetaminophen (TYLENOL PM) 25-500 MG TABS Take 1 tablet by mouth at bedtime as needed (sleep).      . losartan-hydrochlorothiazide (HYZAAR) 100-12.5 MG per tablet Take 0.5 tablets by mouth daily.       . metoprolol succinate (TOPROL-XL) 50 MG 24 hr tablet Take 50 mg by mouth daily. Take with or immediately following a meal.      . sitaGLIPtin (JANUVIA) 50 MG tablet Take 1 tablet (50 mg total) by mouth daily.  10 tablet  0  . sitaGLIPtin (JANUVIA) 50 MG tablet Take 1 tablet (50 mg total) by mouth daily.  90 tablet  1   No current facility-administered medications for this visit.    Allergies  Allergen Reactions  . Colchicine     GI problems  . Flexeril [Cyclobenzaprine Hcl]     GI problems   . Percocet  [Oxycodone-Acetaminophen] Nausea And Vomiting  . Zebeta     Gi problems    Patient Active Problem List   Diagnosis Date Noted  . Chronic cholecystitis with calculus 02/04/2014  . Choledocholithiasis 08/28/2013  . Symptomatic cholelithiasis 06/04/2013  . Diarrhea due to drug 06/04/2013  . Elevated LFTs 06/04/2013  . Benign hypertensive heart disease without heart failure 09/07/2011  . Type II or unspecified type diabetes mellitus without mention of complication, uncontrolled 09/07/2011  . Dyslipidemia 09/07/2011  . Chronic renal insufficiency, stage III (moderate) 09/07/2011    History  Smoking status  . Former Smoker -- 1.00 packs/day for 45 years  . Types: Cigarettes  . Quit date: 09/07/1979  Smokeless tobacco  . Never Used    History  Alcohol Use  . Yes    Comment: beer rarely    Family History  Problem Relation Age of Onset  . Stroke Father   . Stroke Mother   . Stroke Sister   . Stroke Brother   . Stroke Brother   . Stroke Brother     Review of Systems: Constitutional: no fever chills diaphoresis or fatigue or change in weight.  Head and neck: no hearing loss, no epistaxis, no photophobia or visual disturbance. Respiratory: No cough, shortness of breath or wheezing. Cardiovascular: No chest pain  peripheral edema, palpitations. Gastrointestinal: No abdominal distention, no abdominal pain, no change in bowel habits hematochezia or melena. Genitourinary: No dysuria, no frequency, no urgency, no nocturia. Musculoskeletal:No arthralgias, no back pain, no gait disturbance or myalgias. Neurological: No dizziness, no headaches, no numbness, no seizures, no syncope, no weakness, no tremors. Hematologic: No lymphadenopathy, no easy bruising. Psychiatric: No confusion, no hallucinations, no sleep disturbance.    Physical Exam: Filed Vitals:   03/25/14 0937  BP: 142/62  Pulse: 61   the general appearance reveals a well-developed thin gentleman in no acute  distress.The head and neck exam reveals pupils equal and reactive.  Extraocular movements are full.  There is no scleral icterus.  The mouth and pharynx are normal.  The neck is supple.  The carotids reveal no bruits.  The jugular venous pressure is normal.  The  thyroid is not enlarged.  There is no lymphadenopathy.  The chest is clear to percussion and auscultation.  There are no rales or rhonchi.  Expansion of the chest is symmetrical.  The precordium is quiet.  The first heart sound is normal.  The second heart sound is physiologically split.  There is no murmur gallop rub or click.  There is no abnormal lift or heave.  The abdomen is soft and nontender.  The bowel sounds are normal.  The liver and spleen are not enlarged.  There are no abdominal masses.  There are no abdominal bruits.  Extremities reveal good pedal pulses.  There is no phlebitis or edema.  There is no cyanosis or clubbing.  Strength is normal and symmetrical in all extremities.  There is no lateralizing weakness.  There are no sensory deficits.  The skin is warm and dry.  There is no rash.     Assessment / Plan: 1.  Essential hypertension 2. chronic renal insufficiency stage III 3. status post cholecystectomy 4. anemia of chronic disease 5.  Hypercholesterolemia 6. osteoarthritis  Plan: Patient is doing well.  Continue same medication.  We are checking lab work today.  He has not had any recurrent gout.  He will be rechecked in 4 months for office visit EKG lipid panel hepatic function panel and basal metabolic panel.

## 2014-03-26 NOTE — Progress Notes (Signed)
Quick Note:  Please report to patient. The recent labs are stable. Continue same medication and careful diet. Blood sugar is better. Liver tests are normal. ______

## 2014-04-01 ENCOUNTER — Encounter (HOSPITAL_COMMUNITY)
Admission: RE | Admit: 2014-04-01 | Discharge: 2014-04-01 | Disposition: A | Discharge status: Home / Self Care | Payer: Medicare Other | Source: Ambulatory Visit | Attending: Nephrology | Admitting: Nephrology

## 2014-04-01 DIAGNOSIS — N183 Chronic kidney disease, stage 3 unspecified: Secondary | ICD-10-CM | POA: Insufficient documentation

## 2014-04-01 DIAGNOSIS — D638 Anemia in other chronic diseases classified elsewhere: Secondary | ICD-10-CM | POA: Insufficient documentation

## 2014-04-01 LAB — POCT HEMOGLOBIN-HEMACUE: Hemoglobin: 10.3 g/dL — ABNORMAL LOW (ref 13.0–17.0)

## 2014-04-01 MED ORDER — EPOETIN ALFA 10000 UNIT/ML IJ SOLN: 20000.0000 [IU] | INTRAMUSCULAR | Status: DC

## 2014-04-01 MED ORDER — EPOETIN ALFA 20000 UNIT/ML IJ SOLN
INTRAMUSCULAR | Status: AC
  Administered 2014-04-01: 20000 [IU] via SUBCUTANEOUS
  Filled 2014-04-01: qty 1

## 2014-04-22 ENCOUNTER — Other Ambulatory Visit (HOSPITAL_COMMUNITY): Payer: Self-pay

## 2014-04-22 ENCOUNTER — Encounter (HOSPITAL_COMMUNITY): Payer: Medicare Other

## 2014-04-23 ENCOUNTER — Other Ambulatory Visit: Payer: Self-pay | Admitting: Cardiology

## 2014-04-23 ENCOUNTER — Encounter (HOSPITAL_COMMUNITY)
Admission: RE | Admit: 2014-04-23 | Discharge: 2014-04-23 | Disposition: A | Discharge status: Home / Self Care | Payer: Medicare Other | Source: Ambulatory Visit | Attending: Nephrology | Admitting: Nephrology

## 2014-04-23 LAB — FERRITIN: Ferritin: 162 ng/mL (ref 22–322)

## 2014-04-23 LAB — RENAL FUNCTION PANEL
Albumin: 4 g/dL (ref 3.5–5.2)
BUN: 43 mg/dL — AB (ref 6–23)
CALCIUM: 9.4 mg/dL (ref 8.4–10.5)
CO2: 21 meq/L (ref 19–32)
Chloride: 103 mEq/L (ref 96–112)
Creatinine, Ser: 2.26 mg/dL — ABNORMAL HIGH (ref 0.50–1.35)
GFR calc Af Amer: 29 mL/min — ABNORMAL LOW (ref >90)
GFR calc non Af Amer: 25 mL/min — ABNORMAL LOW (ref >90)
Glucose, Bld: 186 mg/dL — ABNORMAL HIGH (ref 70–99)
PHOSPHORUS: 2.9 mg/dL (ref 2.3–4.6)
Potassium: 4.4 mEq/L (ref 3.7–5.3)
Sodium: 138 mEq/L (ref 137–147)

## 2014-04-23 LAB — MAGNESIUM: Magnesium: 2 mg/dL (ref 1.5–2.5)

## 2014-04-23 LAB — IRON AND TIBC
Iron: 142 ug/dL — ABNORMAL HIGH (ref 42–135)
Saturation Ratios: 47 % (ref 20–55)
TIBC: 302 ug/dL (ref 215–435)
UIBC: 160 ug/dL (ref 125–400)

## 2014-04-23 LAB — POCT HEMOGLOBIN-HEMACUE: HEMOGLOBIN: 10.8 g/dL — AB (ref 13.0–17.0)

## 2014-04-23 MED ORDER — EPOETIN ALFA 10000 UNIT/ML IJ SOLN: 20000.0000 [IU] | INTRAMUSCULAR | Status: DC

## 2014-04-23 MED ORDER — EPOETIN ALFA 20000 UNIT/ML IJ SOLN
INTRAMUSCULAR | Status: AC
  Administered 2014-04-23: 20000 [IU] via SUBCUTANEOUS
  Filled 2014-04-23: qty 1

## 2014-04-24 LAB — VITAMIN D 25 HYDROXY (VIT D DEFICIENCY, FRACTURES): Vit D, 25-Hydroxy: 34 ng/mL (ref 30–89)

## 2014-04-24 LAB — PTH, INTACT AND CALCIUM
Calcium, Total (PTH): 9.3 mg/dL (ref 8.4–10.5)
PTH: 75.4 pg/mL — ABNORMAL HIGH (ref 14.0–72.0)

## 2014-04-25 DIAGNOSIS — I10 Essential (primary) hypertension: Secondary | ICD-10-CM | POA: Diagnosis not present

## 2014-04-25 DIAGNOSIS — D638 Anemia in other chronic diseases classified elsewhere: Secondary | ICD-10-CM | POA: Diagnosis not present

## 2014-04-25 DIAGNOSIS — N184 Chronic kidney disease, stage 4 (severe): Secondary | ICD-10-CM | POA: Diagnosis not present

## 2014-04-25 DIAGNOSIS — M948X9 Other specified disorders of cartilage, unspecified sites: Secondary | ICD-10-CM | POA: Diagnosis not present

## 2014-05-03 ENCOUNTER — Other Ambulatory Visit: Payer: Self-pay | Admitting: Cardiology

## 2014-05-12 ENCOUNTER — Encounter (HOSPITAL_COMMUNITY)
Admission: RE | Admit: 2014-05-12 | Discharge: 2014-05-12 | Disposition: A | Discharge status: Home / Self Care | Payer: Medicare Other | Source: Ambulatory Visit | Attending: Nephrology | Admitting: Nephrology

## 2014-05-12 DIAGNOSIS — N183 Chronic kidney disease, stage 3 unspecified: Secondary | ICD-10-CM | POA: Diagnosis not present

## 2014-05-12 DIAGNOSIS — D638 Anemia in other chronic diseases classified elsewhere: Secondary | ICD-10-CM | POA: Diagnosis not present

## 2014-05-12 LAB — POCT HEMOGLOBIN-HEMACUE: Hemoglobin: 10.3 g/dL — ABNORMAL LOW (ref 13.0–17.0)

## 2014-05-12 MED ORDER — EPOETIN ALFA 20000 UNIT/ML IJ SOLN
INTRAMUSCULAR | Status: AC
  Administered 2014-05-12: 20000 [IU] via SUBCUTANEOUS
  Filled 2014-05-12: qty 1

## 2014-05-12 MED ORDER — EPOETIN ALFA 10000 UNIT/ML IJ SOLN: 20000.0000 [IU] | INTRAMUSCULAR | Status: DC

## 2014-05-13 ENCOUNTER — Encounter (HOSPITAL_COMMUNITY): Payer: Medicare Other

## 2014-06-02 ENCOUNTER — Encounter (HOSPITAL_COMMUNITY)
Admission: RE | Admit: 2014-06-02 | Discharge: 2014-06-02 | Disposition: A | Discharge status: Home / Self Care | Payer: Medicare Other | Source: Ambulatory Visit | Attending: Nephrology | Admitting: Nephrology

## 2014-06-02 DIAGNOSIS — N183 Chronic kidney disease, stage 3 unspecified: Secondary | ICD-10-CM | POA: Diagnosis not present

## 2014-06-02 DIAGNOSIS — D638 Anemia in other chronic diseases classified elsewhere: Secondary | ICD-10-CM | POA: Diagnosis not present

## 2014-06-02 LAB — RENAL FUNCTION PANEL
Albumin: 3.7 g/dL (ref 3.5–5.2)
Anion gap: 16 — ABNORMAL HIGH (ref 5–15)
BUN: 40 mg/dL — ABNORMAL HIGH (ref 6–23)
CALCIUM: 8.9 mg/dL (ref 8.4–10.5)
CO2: 20 meq/L (ref 19–32)
Chloride: 106 mEq/L (ref 96–112)
Creatinine, Ser: 2.24 mg/dL — ABNORMAL HIGH (ref 0.50–1.35)
GFR, EST AFRICAN AMERICAN: 29 mL/min — AB (ref >90)
GFR, EST NON AFRICAN AMERICAN: 25 mL/min — AB (ref >90)
GLUCOSE: 199 mg/dL — AB (ref 70–99)
Phosphorus: 3.4 mg/dL (ref 2.3–4.6)
Potassium: 4.1 mEq/L (ref 3.7–5.3)
SODIUM: 142 meq/L (ref 137–147)

## 2014-06-02 LAB — IRON AND TIBC
IRON: 124 ug/dL (ref 42–135)
Saturation Ratios: 44 % (ref 20–55)
TIBC: 281 ug/dL (ref 215–435)
UIBC: 157 ug/dL (ref 125–400)

## 2014-06-02 LAB — POCT HEMOGLOBIN-HEMACUE: HEMOGLOBIN: 10.5 g/dL — AB (ref 13.0–17.0)

## 2014-06-02 LAB — FERRITIN: FERRITIN: 96 ng/mL (ref 22–322)

## 2014-06-02 MED ORDER — EPOETIN ALFA 20000 UNIT/ML IJ SOLN
INTRAMUSCULAR | Status: AC
  Administered 2014-06-02: 20000 [IU] via SUBCUTANEOUS
  Filled 2014-06-02: qty 1

## 2014-06-02 MED ORDER — EPOETIN ALFA 10000 UNIT/ML IJ SOLN: 20000.0000 [IU] | INTRAMUSCULAR | Status: DC

## 2014-06-09 ENCOUNTER — Other Ambulatory Visit: Payer: Self-pay | Admitting: *Deleted

## 2014-06-09 MED ORDER — LOSARTAN POTASSIUM-HCTZ 100-12.5 MG PO TABS: 0.5000 | ORAL_TABLET | Freq: Every day | ORAL | Status: DC

## 2014-06-23 ENCOUNTER — Encounter (HOSPITAL_COMMUNITY)
Admission: RE | Admit: 2014-06-23 | Discharge: 2014-06-23 | Disposition: A | Discharge status: Home / Self Care | Payer: Medicare Other | Source: Ambulatory Visit | Attending: Nephrology | Admitting: Nephrology

## 2014-06-23 DIAGNOSIS — D638 Anemia in other chronic diseases classified elsewhere: Secondary | ICD-10-CM | POA: Diagnosis not present

## 2014-06-23 LAB — POCT HEMOGLOBIN-HEMACUE: HEMOGLOBIN: 9.7 g/dL — AB (ref 13.0–17.0)

## 2014-06-23 MED ORDER — EPOETIN ALFA 20000 UNIT/ML IJ SOLN
INTRAMUSCULAR | Status: AC
  Administered 2014-06-23: 20000 [IU] via SUBCUTANEOUS
  Filled 2014-06-23: qty 1

## 2014-06-23 MED ORDER — EPOETIN ALFA 10000 UNIT/ML IJ SOLN: 20000.0000 [IU] | INTRAMUSCULAR | Status: DC

## 2014-07-14 ENCOUNTER — Encounter (HOSPITAL_COMMUNITY)
Admission: RE | Admit: 2014-07-14 | Discharge: 2014-07-14 | Disposition: A | Discharge status: Home / Self Care | Payer: Medicare Other | Source: Ambulatory Visit | Attending: Nephrology | Admitting: Nephrology

## 2014-07-14 ENCOUNTER — Other Ambulatory Visit: Payer: Self-pay | Admitting: Cardiology

## 2014-07-14 DIAGNOSIS — N183 Chronic kidney disease, stage 3 unspecified: Secondary | ICD-10-CM | POA: Diagnosis not present

## 2014-07-14 DIAGNOSIS — D638 Anemia in other chronic diseases classified elsewhere: Secondary | ICD-10-CM | POA: Insufficient documentation

## 2014-07-14 LAB — RENAL FUNCTION PANEL
ANION GAP: 14 (ref 5–15)
Albumin: 3.6 g/dL (ref 3.5–5.2)
BUN: 38 mg/dL — AB (ref 6–23)
CO2: 20 meq/L (ref 19–32)
Calcium: 8.9 mg/dL (ref 8.4–10.5)
Chloride: 105 mEq/L (ref 96–112)
Creatinine, Ser: 2.22 mg/dL — ABNORMAL HIGH (ref 0.50–1.35)
GFR calc non Af Amer: 25 mL/min — ABNORMAL LOW (ref >90)
GFR, EST AFRICAN AMERICAN: 29 mL/min — AB (ref >90)
GLUCOSE: 195 mg/dL — AB (ref 70–99)
PHOSPHORUS: 3.1 mg/dL (ref 2.3–4.6)
POTASSIUM: 4.4 meq/L (ref 3.7–5.3)
SODIUM: 139 meq/L (ref 137–147)

## 2014-07-14 LAB — IRON AND TIBC
Iron: 120 ug/dL (ref 42–135)
SATURATION RATIOS: 39 % (ref 20–55)
TIBC: 304 ug/dL (ref 215–435)
UIBC: 184 ug/dL (ref 125–400)

## 2014-07-14 LAB — FERRITIN: Ferritin: 65 ng/mL (ref 22–322)

## 2014-07-14 MED ORDER — EPOETIN ALFA 10000 UNIT/ML IJ SOLN: 20000.0000 [IU] | INTRAMUSCULAR | Status: DC

## 2014-07-14 MED ORDER — EPOETIN ALFA 20000 UNIT/ML IJ SOLN
INTRAMUSCULAR | Status: AC
  Administered 2014-07-14: 20000 [IU] via SUBCUTANEOUS
  Filled 2014-07-14: qty 1

## 2014-07-15 LAB — POCT HEMOGLOBIN-HEMACUE: Hemoglobin: 10.9 g/dL — ABNORMAL LOW (ref 13.0–17.0)

## 2014-07-19 ENCOUNTER — Other Ambulatory Visit: Payer: Self-pay | Admitting: Cardiology

## 2014-07-21 NOTE — Telephone Encounter (Signed)
His nephrologist is the one who prescribed the Januvia

## 2014-07-23 ENCOUNTER — Encounter: Payer: Self-pay | Admitting: Cardiology

## 2014-07-23 ENCOUNTER — Other Ambulatory Visit: Payer: Medicare Other

## 2014-07-23 ENCOUNTER — Ambulatory Visit (INDEPENDENT_AMBULATORY_CARE_PROVIDER_SITE_OTHER): Payer: Medicare Other | Admitting: Cardiology

## 2014-07-23 VITALS — BP 130/68 | HR 61 | Ht 68.0 in | Wt 146.8 lb

## 2014-07-23 DIAGNOSIS — N183 Chronic kidney disease, stage 3 unspecified: Secondary | ICD-10-CM

## 2014-07-23 DIAGNOSIS — I119 Hypertensive heart disease without heart failure: Secondary | ICD-10-CM

## 2014-07-23 DIAGNOSIS — E7800 Pure hypercholesterolemia, unspecified: Secondary | ICD-10-CM | POA: Diagnosis not present

## 2014-07-23 DIAGNOSIS — R002 Palpitations: Secondary | ICD-10-CM

## 2014-07-23 DIAGNOSIS — IMO0001 Reserved for inherently not codable concepts without codable children: Secondary | ICD-10-CM | POA: Diagnosis not present

## 2014-07-23 DIAGNOSIS — E785 Hyperlipidemia, unspecified: Secondary | ICD-10-CM | POA: Diagnosis not present

## 2014-07-23 DIAGNOSIS — E1165 Type 2 diabetes mellitus with hyperglycemia: Secondary | ICD-10-CM

## 2014-07-23 HISTORY — DX: Palpitations: R00.2

## 2014-07-23 LAB — HEPATIC FUNCTION PANEL
ALBUMIN: 3.9 g/dL (ref 3.5–5.2)
ALT: 16 U/L (ref 0–53)
AST: 21 U/L (ref 0–37)
Alkaline Phosphatase: 52 U/L (ref 39–117)
Bilirubin, Direct: 0.1 mg/dL (ref 0.0–0.3)
TOTAL PROTEIN: 7.6 g/dL (ref 6.0–8.3)
Total Bilirubin: 0.6 mg/dL (ref 0.2–1.2)

## 2014-07-23 LAB — LIPID PANEL
CHOL/HDL RATIO: 2
CHOLESTEROL: 108 mg/dL (ref 0–200)
HDL: 52.4 mg/dL (ref >39.00)
LDL Cholesterol: 27 mg/dL (ref 0–99)
NonHDL: 55.6
Triglycerides: 141 mg/dL (ref 0.0–149.0)
VLDL: 28.2 mg/dL (ref 0.0–40.0)

## 2014-07-23 LAB — BASIC METABOLIC PANEL
BUN: 35 mg/dL — ABNORMAL HIGH (ref 6–23)
CALCIUM: 8.8 mg/dL (ref 8.4–10.5)
CO2: 21 mEq/L (ref 19–32)
CREATININE: 2.3 mg/dL — AB (ref 0.4–1.5)
Chloride: 107 mEq/L (ref 96–112)
GFR: 29.34 mL/min — AB (ref >60.00)
Glucose, Bld: 114 mg/dL — ABNORMAL HIGH (ref 70–99)
Potassium: 4.8 mEq/L (ref 3.5–5.1)
Sodium: 138 mEq/L (ref 135–145)

## 2014-07-23 NOTE — Assessment & Plan Note (Signed)
The patient has not been experiencing any chest pain or shortness of breath.

## 2014-07-23 NOTE — Patient Instructions (Signed)
Will obtain labs today and call you with the results (lp/bmet/hfp)  Your physician recommends that you continue on your current medications as directed. Please refer to the Current Medication list given to you today.  Your physician recommends that you schedule a follow-up appointment in: 4 months with fasting labs (lp/bmet/hfp)   USE EXTRA HALF TOPROL (METOPROLOL) AS NEEDED FOR SEVERE PALPITATIONS

## 2014-07-23 NOTE — Progress Notes (Signed)
Quick Note:  Please report to patient. The recent labs are stable. Continue same medication and careful diet. ______ 

## 2014-07-23 NOTE — Assessment & Plan Note (Signed)
The patient has occasional intermittent palpitations.  Sometimes they're quite noticeable.  One day he canceled his golf game because there were so frequent.  He describes it as a regular pulse with an occasional pause.  It does not sound like atrial fibrillation.  Today's EKG shows normal sinus rhythm and no premature beats.

## 2014-07-23 NOTE — Assessment & Plan Note (Signed)
The patient has a history of dyslipidemia.  He is on atorvastatin 10 mg daily.  He is not having any myalgias.  We're checking lab work today.

## 2014-07-23 NOTE — Progress Notes (Signed)
Gerald Jacobs Date of Birth:  08/21/1925 [address] [place], [place]  [address] [phone]        Fax   [phone]   History of Present Illness: This pleasant 80 year old gentleman is seen for a scheduled followup office visit. He has a past history of essential hypertension. He also has a history of dyslipidemia, diabetes, and mild renal insufficiency. Also has a history of hyperuricemia. He has anemia secondary to chronic renal disease and receives ejections of Procrit about every 3 weeks depending on his hemoglobin level. Dr. Posey Pronto is his nephrologist.  He is status post cholecystectomy on 02/04/14 by Dr. Zella Richer.  Postoperatively he has done well. He has noted occasional palpitations.  Current Outpatient Prescriptions  Medication Sig Dispense Refill  . allopurinol (ZYLOPRIM) 100 MG tablet TAKE 1 TABLET DAILY  90 tablet  0  . aspirin 81 MG tablet Take 81 mg by mouth daily.        Marland Kitchen atorvastatin (LIPITOR) 10 MG tablet TAKE 1 TABLET BY MOUTH EVERY DAY  90 tablet  0  . Biotin 1000 MCG tablet Take 1,000 mcg by mouth daily.      . diphenhydramine-acetaminophen (TYLENOL PM) 25-500 MG TABS Take 1 tablet by mouth at bedtime as needed (sleep).      Marland Kitchen JANUVIA 50 MG tablet TAKE 1 TABLET DAILY  90 tablet  3  . losartan-hydrochlorothiazide (HYZAAR) 100-12.5 MG per tablet Take 0.5 tablets by mouth daily.  45 tablet  0  . metoprolol succinate (TOPROL-XL) 50 MG 24 hr tablet TAKE 1 TABLET DAILY, TAKE  WITH OR IMMEDIATELY        FOLLOWING A MEAL  90 tablet  0  . Omega-3 Fatty Acids (FISH OIL PO) Take 1 capsule by mouth daily.       No current facility-administered medications for this visit.    Allergies  Allergen Reactions  . Colchicine Other (See Comments)    GI problems  . Flexeril [Cyclobenzaprine Hcl] Other (See Comments)    GI problems   . Percocet [Oxycodone-Acetaminophen] Nausea And Vomiting  . Zebeta Other (See Comments)    Gi problems     Patient Active Problem List   Diagnosis Date Noted  . Intermittent palpitations 07/23/2014  . Chronic cholecystitis with calculus 02/04/2014  . Choledocholithiasis 08/28/2013  . Symptomatic cholelithiasis 06/04/2013  . Diarrhea due to drug 06/04/2013  . Elevated LFTs 06/04/2013  . Benign hypertensive heart disease without heart failure 09/07/2011  . Type II or unspecified type diabetes mellitus without mention of complication, uncontrolled 09/07/2011  . Dyslipidemia 09/07/2011  . Chronic renal insufficiency, stage III (moderate) 09/07/2011    History  Smoking status  . Former Smoker -- 1.00 packs/day for 45 years  . Types: Cigarettes  . Quit date: 09/07/1979  Smokeless tobacco  . Never Used    History  Alcohol Use  . Yes    Comment: beer rarely    Family History  Problem Relation Age of Onset  . Stroke Father   . Stroke Mother   . Stroke Sister   . Stroke Brother   . Stroke Brother   . Stroke Brother     Review of Systems: Constitutional: no fever chills diaphoresis or fatigue or change in weight.  Head and neck: no hearing loss, no epistaxis, no photophobia or visual disturbance. Respiratory: No cough, shortness of breath or wheezing. Cardiovascular: No chest pain peripheral edema, palpitations. Gastrointestinal: No abdominal distention, no abdominal pain,  no change in bowel habits hematochezia or melena. Genitourinary: No dysuria, no frequency, no urgency, no nocturia. Musculoskeletal:No arthralgias, no back pain, no gait disturbance or myalgias. Neurological: No dizziness, no headaches, no numbness, no seizures, no syncope, no weakness, no tremors. Hematologic: No lymphadenopathy, no easy bruising. Psychiatric: No confusion, no hallucinations, no sleep disturbance.    Physical Exam: Filed Vitals:   07/23/14 0758  BP: 130/68  Pulse: 61   The patient appears to be in no distress.  Head and neck exam reveals that the pupils are equal and reactive.   The extraocular movements are full.  There is no scleral icterus.  Mouth and pharynx are benign.  No lymphadenopathy.  No carotid bruits.  The jugular venous pressure is normal.  Thyroid is not enlarged or tender.  Chest is clear to percussion and auscultation.  No rales or rhonchi.  Expansion of the chest is symmetrical.  Heart reveals no abnormal lift or heave.  First and second heart sounds are normal.  There is no murmur gallop rub or click.  An occasional premature beat is audible.  The abdomen is soft and nontender.  Bowel sounds are normoactive.  There is no hepatosplenomegaly or mass.  There are no abdominal bruits.  Extremities reveal no phlebitis or edema.  Pedal pulses are good.  There is no cyanosis or clubbing.  Neurologic exam is normal strength and no lateralizing weakness.  No sensory deficits.  Integument reveals no rash   Assessment / Plan: 1. 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 intermittent palpitations 3. chronic renal insufficiency stage III 4. diabetes mellitus of adult onset 5. past history of gout, on allopurinol. 6. Hypercholesterolemia  Disposition: Continue same medication.  Recheck in 4 months for office visit and fasting lipid panel hepatic function panel and basal metabolic panel. If he has increased palpitations he may take an extra half tablet of Toprol on a when necessary basis.  He is to avoid excessive caffeine. We will be sure that his serum potassium is okay today.

## 2014-07-24 ENCOUNTER — Telehealth: Payer: Self-pay

## 2014-07-24 NOTE — Telephone Encounter (Signed)
Message copied by Lamar Laundry on Thu Jul 24, 2014  9:45 AM ------      Message from: Darlin Coco      Created: Wed Jul 23, 2014  9:03 PM       Please report to patient.  The recent labs are stable. Continue same medication and careful diet. ------

## 2014-07-24 NOTE — Telephone Encounter (Signed)
Follow up     Returned Lisa's call.  Please call after 3:30

## 2014-07-24 NOTE — Telephone Encounter (Signed)
Message copied by Lamar Laundry on Thu Jul 24, 2014  3:08 PM ------      Message from: Darlin Coco      Created: Wed Jul 23, 2014  9:03 PM       Please report to patient.  The recent labs are stable. Continue same medication and careful diet. ------

## 2014-07-24 NOTE — Telephone Encounter (Signed)
returned pt call.2nd attempt to give pt lab results.lmom The recent labs are stable. Continue same medication and careful diet.

## 2014-07-24 NOTE — Telephone Encounter (Signed)
called to give pt lab results.lmtcb

## 2014-08-01 ENCOUNTER — Other Ambulatory Visit (HOSPITAL_COMMUNITY): Payer: Self-pay | Admitting: *Deleted

## 2014-08-05 ENCOUNTER — Encounter (HOSPITAL_COMMUNITY)
Admission: RE | Admit: 2014-08-05 | Discharge: 2014-08-05 | Disposition: A | Discharge status: Home / Self Care | Payer: Medicare Other | Source: Ambulatory Visit | Attending: Nephrology | Admitting: Nephrology

## 2014-08-05 DIAGNOSIS — N183 Chronic kidney disease, stage 3 unspecified: Secondary | ICD-10-CM | POA: Insufficient documentation

## 2014-08-05 DIAGNOSIS — D638 Anemia in other chronic diseases classified elsewhere: Secondary | ICD-10-CM | POA: Diagnosis not present

## 2014-08-05 LAB — POCT HEMOGLOBIN-HEMACUE: HEMOGLOBIN: 10.1 g/dL — AB (ref 13.0–17.0)

## 2014-08-05 MED ORDER — EPOETIN ALFA 10000 UNIT/ML IJ SOLN: 30000.0000 [IU] | INTRAMUSCULAR | Status: DC

## 2014-08-05 MED ORDER — EPOETIN ALFA 20000 UNIT/ML IJ SOLN
INTRAMUSCULAR | Status: AC
  Administered 2014-08-05: 20000 [IU] via SUBCUTANEOUS
  Filled 2014-08-05: qty 1

## 2014-08-05 MED ORDER — EPOETIN ALFA 10000 UNIT/ML IJ SOLN
INTRAMUSCULAR | Status: AC
  Administered 2014-08-05: 10000 [IU] via SUBCUTANEOUS
  Filled 2014-08-05: qty 1

## 2014-08-15 DIAGNOSIS — N184 Chronic kidney disease, stage 4 (severe): Secondary | ICD-10-CM | POA: Diagnosis not present

## 2014-08-15 DIAGNOSIS — D638 Anemia in other chronic diseases classified elsewhere: Secondary | ICD-10-CM | POA: Diagnosis not present

## 2014-08-15 DIAGNOSIS — I10 Essential (primary) hypertension: Secondary | ICD-10-CM | POA: Diagnosis not present

## 2014-08-15 DIAGNOSIS — M948X9 Other specified disorders of cartilage, unspecified sites: Secondary | ICD-10-CM | POA: Diagnosis not present

## 2014-08-25 ENCOUNTER — Encounter (HOSPITAL_COMMUNITY)
Admission: RE | Admit: 2014-08-25 | Discharge: 2014-08-25 | Disposition: A | Discharge status: Home / Self Care | Payer: Medicare Other | Source: Ambulatory Visit | Attending: Nephrology | Admitting: Nephrology

## 2014-08-25 DIAGNOSIS — D638 Anemia in other chronic diseases classified elsewhere: Secondary | ICD-10-CM | POA: Diagnosis not present

## 2014-08-25 LAB — RENAL FUNCTION PANEL
Albumin: 3.7 g/dL (ref 3.5–5.2)
Anion gap: 13 (ref 5–15)
BUN: 36 mg/dL — AB (ref 6–23)
CALCIUM: 8.8 mg/dL (ref 8.4–10.5)
CHLORIDE: 105 meq/L (ref 96–112)
CO2: 23 mEq/L (ref 19–32)
Creatinine, Ser: 2.17 mg/dL — ABNORMAL HIGH (ref 0.50–1.35)
GFR calc Af Amer: 30 mL/min — ABNORMAL LOW (ref >90)
GFR calc non Af Amer: 26 mL/min — ABNORMAL LOW (ref >90)
Glucose, Bld: 225 mg/dL — ABNORMAL HIGH (ref 70–99)
Phosphorus: 3.5 mg/dL (ref 2.3–4.6)
Potassium: 4.7 mEq/L (ref 3.7–5.3)
Sodium: 141 mEq/L (ref 137–147)

## 2014-08-25 LAB — FERRITIN: Ferritin: 53 ng/mL (ref 22–322)

## 2014-08-25 LAB — IRON AND TIBC
IRON: 81 ug/dL (ref 42–135)
SATURATION RATIOS: 28 % (ref 20–55)
TIBC: 291 ug/dL (ref 215–435)
UIBC: 210 ug/dL (ref 125–400)

## 2014-08-25 LAB — POCT HEMOGLOBIN-HEMACUE: HEMOGLOBIN: 10.8 g/dL — AB (ref 13.0–17.0)

## 2014-08-25 MED ORDER — EPOETIN ALFA 10000 UNIT/ML IJ SOLN
INTRAMUSCULAR | Status: AC
  Administered 2014-08-25: 10000 [IU] via SUBCUTANEOUS
  Filled 2014-08-25: qty 1

## 2014-08-25 MED ORDER — EPOETIN ALFA 20000 UNIT/ML IJ SOLN
INTRAMUSCULAR | Status: AC
  Administered 2014-08-25: 20000 [IU] via SUBCUTANEOUS
  Filled 2014-08-25: qty 1

## 2014-08-25 MED ORDER — EPOETIN ALFA 10000 UNIT/ML IJ SOLN: 30000.0000 [IU] | INTRAMUSCULAR | Status: DC

## 2014-08-26 ENCOUNTER — Encounter (HOSPITAL_COMMUNITY): Payer: Medicare Other

## 2014-08-30 ENCOUNTER — Other Ambulatory Visit: Payer: Self-pay | Admitting: Cardiology

## 2014-09-09 DIAGNOSIS — D1801 Hemangioma of skin and subcutaneous tissue: Secondary | ICD-10-CM | POA: Diagnosis not present

## 2014-09-09 DIAGNOSIS — L720 Epidermal cyst: Secondary | ICD-10-CM | POA: Diagnosis not present

## 2014-09-09 DIAGNOSIS — Z85820 Personal history of malignant melanoma of skin: Secondary | ICD-10-CM | POA: Diagnosis not present

## 2014-09-09 DIAGNOSIS — Z85828 Personal history of other malignant neoplasm of skin: Secondary | ICD-10-CM | POA: Diagnosis not present

## 2014-09-09 DIAGNOSIS — D2271 Melanocytic nevi of right lower limb, including hip: Secondary | ICD-10-CM | POA: Diagnosis not present

## 2014-09-09 DIAGNOSIS — L918 Other hypertrophic disorders of the skin: Secondary | ICD-10-CM | POA: Diagnosis not present

## 2014-09-13 ENCOUNTER — Other Ambulatory Visit: Payer: Self-pay | Admitting: Cardiology

## 2014-09-15 ENCOUNTER — Encounter (HOSPITAL_COMMUNITY)
Admission: RE | Admit: 2014-09-15 | Discharge: 2014-09-15 | Disposition: A | Discharge status: Home / Self Care | Payer: Medicare Other | Source: Ambulatory Visit | Attending: Nephrology | Admitting: Nephrology

## 2014-09-15 DIAGNOSIS — N183 Chronic kidney disease, stage 3 (moderate): Secondary | ICD-10-CM | POA: Insufficient documentation

## 2014-09-15 DIAGNOSIS — D631 Anemia in chronic kidney disease: Secondary | ICD-10-CM | POA: Diagnosis not present

## 2014-09-15 LAB — POCT HEMOGLOBIN-HEMACUE: Hemoglobin: 10.5 g/dL — ABNORMAL LOW (ref 13.0–17.0)

## 2014-09-15 MED ORDER — EPOETIN ALFA 10000 UNIT/ML IJ SOLN
INTRAMUSCULAR | Status: AC
  Administered 2014-09-15: 10000 [IU] via SUBCUTANEOUS
  Filled 2014-09-15: qty 1

## 2014-09-15 MED ORDER — EPOETIN ALFA 10000 UNIT/ML IJ SOLN: 30000.0000 [IU] | INTRAMUSCULAR | Status: DC

## 2014-09-15 MED ORDER — EPOETIN ALFA 20000 UNIT/ML IJ SOLN
INTRAMUSCULAR | Status: AC
  Administered 2014-09-15: 20000 [IU] via SUBCUTANEOUS
  Filled 2014-09-15: qty 1

## 2014-09-22 DIAGNOSIS — Z23 Encounter for immunization: Secondary | ICD-10-CM | POA: Diagnosis not present

## 2014-10-06 ENCOUNTER — Encounter (HOSPITAL_COMMUNITY)
Admission: RE | Admit: 2014-10-06 | Discharge: 2014-10-06 | Disposition: A | Discharge status: Home / Self Care | Payer: Medicare Other | Source: Ambulatory Visit | Attending: Nephrology | Admitting: Nephrology

## 2014-10-06 DIAGNOSIS — D631 Anemia in chronic kidney disease: Secondary | ICD-10-CM | POA: Insufficient documentation

## 2014-10-06 DIAGNOSIS — N183 Chronic kidney disease, stage 3 (moderate): Secondary | ICD-10-CM | POA: Diagnosis not present

## 2014-10-06 LAB — IRON AND TIBC
Iron: 100 ug/dL (ref 42–135)
Saturation Ratios: 31 % (ref 20–55)
TIBC: 319 ug/dL (ref 215–435)
UIBC: 219 ug/dL (ref 125–400)

## 2014-10-06 LAB — RENAL FUNCTION PANEL
Albumin: 3.7 g/dL (ref 3.5–5.2)
Anion gap: 15 (ref 5–15)
BUN: 49 mg/dL — AB (ref 6–23)
CO2: 21 mEq/L (ref 19–32)
Calcium: 8.7 mg/dL (ref 8.4–10.5)
Chloride: 104 mEq/L (ref 96–112)
Creatinine, Ser: 2.36 mg/dL — ABNORMAL HIGH (ref 0.50–1.35)
GFR calc Af Amer: 27 mL/min — ABNORMAL LOW (ref >90)
GFR calc non Af Amer: 23 mL/min — ABNORMAL LOW (ref >90)
GLUCOSE: 267 mg/dL — AB (ref 70–99)
PHOSPHORUS: 3.6 mg/dL (ref 2.3–4.6)
Potassium: 4.6 mEq/L (ref 3.7–5.3)
Sodium: 140 mEq/L (ref 137–147)

## 2014-10-06 LAB — POCT HEMOGLOBIN-HEMACUE: Hemoglobin: 10.9 g/dL — ABNORMAL LOW (ref 13.0–17.0)

## 2014-10-06 LAB — FERRITIN: Ferritin: 49 ng/mL (ref 22–322)

## 2014-10-06 MED ORDER — EPOETIN ALFA 10000 UNIT/ML IJ SOLN
INTRAMUSCULAR | Status: AC
  Administered 2014-10-06: 10000 [IU] via SUBCUTANEOUS
  Filled 2014-10-06: qty 1

## 2014-10-06 MED ORDER — EPOETIN ALFA 20000 UNIT/ML IJ SOLN
INTRAMUSCULAR | Status: AC
  Administered 2014-10-06: 20000 [IU] via SUBCUTANEOUS
  Filled 2014-10-06: qty 1

## 2014-10-06 MED ORDER — EPOETIN ALFA 10000 UNIT/ML IJ SOLN: 30000.0000 [IU] | INTRAMUSCULAR | Status: DC

## 2014-10-10 ENCOUNTER — Other Ambulatory Visit: Payer: Self-pay | Admitting: Cardiology

## 2014-10-11 ENCOUNTER — Other Ambulatory Visit: Payer: Self-pay | Admitting: Cardiology

## 2014-10-27 ENCOUNTER — Encounter (HOSPITAL_COMMUNITY)
Admission: RE | Admit: 2014-10-27 | Discharge: 2014-10-27 | Disposition: A | Discharge status: Home / Self Care | Payer: Medicare Other | Source: Ambulatory Visit | Attending: Nephrology | Admitting: Nephrology

## 2014-10-27 DIAGNOSIS — N183 Chronic kidney disease, stage 3 (moderate): Secondary | ICD-10-CM | POA: Diagnosis not present

## 2014-10-27 DIAGNOSIS — D631 Anemia in chronic kidney disease: Secondary | ICD-10-CM | POA: Diagnosis not present

## 2014-10-27 LAB — POCT HEMOGLOBIN-HEMACUE: Hemoglobin: 10.8 g/dL — ABNORMAL LOW (ref 13.0–17.0)

## 2014-10-27 MED ORDER — EPOETIN ALFA 10000 UNIT/ML IJ SOLN: 30000.0000 [IU] | INTRAMUSCULAR | Status: DC

## 2014-10-27 MED ORDER — EPOETIN ALFA 10000 UNIT/ML IJ SOLN
INTRAMUSCULAR | Status: AC
  Administered 2014-10-27: 10000 [IU] via SUBCUTANEOUS
  Filled 2014-10-27: qty 1

## 2014-10-27 MED ORDER — EPOETIN ALFA 20000 UNIT/ML IJ SOLN
INTRAMUSCULAR | Status: AC
  Administered 2014-10-27: 20000 [IU] via SUBCUTANEOUS
  Filled 2014-10-27: qty 1

## 2014-10-30 ENCOUNTER — Telehealth: Payer: Self-pay | Admitting: *Deleted

## 2014-10-30 NOTE — Telephone Encounter (Signed)
New Message  Pt stated that he received the vacc at Urgent Care but did not specify when.

## 2014-11-11 ENCOUNTER — Other Ambulatory Visit: Payer: Self-pay | Admitting: Nurse Practitioner

## 2014-11-17 ENCOUNTER — Encounter (HOSPITAL_COMMUNITY)
Admission: RE | Admit: 2014-11-17 | Discharge: 2014-11-17 | Disposition: A | Discharge status: Home / Self Care | Payer: Medicare Other | Source: Ambulatory Visit | Attending: Nephrology | Admitting: Nephrology

## 2014-11-17 DIAGNOSIS — D631 Anemia in chronic kidney disease: Secondary | ICD-10-CM | POA: Diagnosis not present

## 2014-11-17 DIAGNOSIS — N183 Chronic kidney disease, stage 3 (moderate): Secondary | ICD-10-CM | POA: Insufficient documentation

## 2014-11-17 LAB — RENAL FUNCTION PANEL
ALBUMIN: 3.7 g/dL (ref 3.5–5.2)
Anion gap: 14 (ref 5–15)
BUN: 41 mg/dL — ABNORMAL HIGH (ref 6–23)
CO2: 22 mEq/L (ref 19–32)
CREATININE: 2.07 mg/dL — AB (ref 0.50–1.35)
Calcium: 8.9 mg/dL (ref 8.4–10.5)
Chloride: 105 mEq/L (ref 96–112)
GFR calc Af Amer: 31 mL/min — ABNORMAL LOW (ref >90)
GFR, EST NON AFRICAN AMERICAN: 27 mL/min — AB (ref >90)
Glucose, Bld: 178 mg/dL — ABNORMAL HIGH (ref 70–99)
PHOSPHORUS: 2.9 mg/dL (ref 2.3–4.6)
Potassium: 4.3 mEq/L (ref 3.7–5.3)
Sodium: 141 mEq/L (ref 137–147)

## 2014-11-17 LAB — FERRITIN: FERRITIN: 49 ng/mL (ref 22–322)

## 2014-11-17 LAB — IRON AND TIBC
IRON: 100 ug/dL (ref 42–135)
SATURATION RATIOS: 35 % (ref 20–55)
TIBC: 289 ug/dL (ref 215–435)
UIBC: 189 ug/dL (ref 125–400)

## 2014-11-17 MED ORDER — EPOETIN ALFA 20000 UNIT/ML IJ SOLN
INTRAMUSCULAR | Status: AC
  Administered 2014-11-17: 20000 [IU]
  Filled 2014-11-17: qty 1

## 2014-11-17 MED ORDER — EPOETIN ALFA 10000 UNIT/ML IJ SOLN
INTRAMUSCULAR | Status: AC
  Administered 2014-11-17: 10000 [IU] via SUBCUTANEOUS
  Filled 2014-11-17: qty 1

## 2014-11-17 MED ORDER — EPOETIN ALFA 10000 UNIT/ML IJ SOLN
30000.0000 [IU] | INTRAMUSCULAR | Status: DC
  Administered 2014-11-17: 10000 [IU] via SUBCUTANEOUS

## 2014-11-18 LAB — POCT HEMOGLOBIN-HEMACUE: Hemoglobin: 11.1 g/dL — ABNORMAL LOW (ref 13.0–17.0)

## 2014-11-24 ENCOUNTER — Ambulatory Visit (INDEPENDENT_AMBULATORY_CARE_PROVIDER_SITE_OTHER): Payer: Medicare Other | Admitting: Cardiology

## 2014-11-24 ENCOUNTER — Encounter: Payer: Self-pay | Admitting: Cardiology

## 2014-11-24 VITALS — BP 124/56 | HR 65 | Ht 68.0 in | Wt 149.0 lb

## 2014-11-24 DIAGNOSIS — R002 Palpitations: Secondary | ICD-10-CM

## 2014-11-24 DIAGNOSIS — I493 Ventricular premature depolarization: Secondary | ICD-10-CM

## 2014-11-24 DIAGNOSIS — I119 Hypertensive heart disease without heart failure: Secondary | ICD-10-CM

## 2014-11-24 DIAGNOSIS — E785 Hyperlipidemia, unspecified: Secondary | ICD-10-CM | POA: Diagnosis not present

## 2014-11-24 MED ORDER — METOPROLOL SUCCINATE ER 50 MG PO TB24: ORAL_TABLET | ORAL | Status: DC

## 2014-11-24 NOTE — Patient Instructions (Signed)
INCREASE YOUR TOPROL (METOPROLOL) TO 50 MG 1 IN THE MORNING AND 1/2 IN THE EVENING  Your physician wants you to follow-up in: 4 months with fasting labs (lp/bmet/hfp) You will receive a reminder letter in the mail two months in advance. If you don't receive a letter, please call our office to schedule the follow-up appointment.

## 2014-11-24 NOTE — Progress Notes (Signed)
Gerald Jacobs Date of Birth:  June 16, 1925 [address] [place], [place]  [address] [phone]        Fax   [phone]   History of Present Illness: This pleasant 80 year old gentleman is seen for a scheduled followup office visit. He has a past history of essential hypertension. He also has a history of dyslipidemia, diabetes, and mild renal insufficiency. Also has a history of hyperuricemia. He has anemia secondary to chronic renal disease and receives ejections of Procrit about every 3 weeks depending on his hemoglobin level. Dr. Posey Pronto is his nephrologist.  He is status post cholecystectomy on 02/04/14 by Dr. Zella Richer.  Postoperatively he has done well. He has noted occasional palpitations.  These seem to be more prevalent at rest and he does not notice them when he is active such as playing golf. The patient has not been experiencing any acute gout or acute arthritis. The patient has not been having any hypoglycemic episodes. Post cholecystectomy the patient is doing well from the digestive standpoint  Current Outpatient Prescriptions  Medication Sig Dispense Refill  . allopurinol (ZYLOPRIM) 100 MG tablet TAKE 1 TABLET DAILY 90 tablet 0  . aspirin 81 MG tablet Take 81 mg by mouth daily.      Marland Kitchen atorvastatin (LIPITOR) 10 MG tablet TAKE 1 TABLET BY MOUTH EVERY DAY 90 tablet 0  . Biotin 1000 MCG tablet Take 1,000 mcg by mouth daily.    . diphenhydramine-acetaminophen (TYLENOL PM) 25-500 MG TABS Take 1 tablet by mouth at bedtime as needed (sleep).    Marland Kitchen JANUVIA 50 MG tablet TAKE 1 TABLET DAILY 90 tablet 3  . losartan-hydrochlorothiazide (HYZAAR) 100-12.5 MG per tablet TAKE ONE-HALF TABLETS BY MOUTH DAILY. 45 tablet 3  . metoprolol succinate (TOPROL-XL) 50 MG 24 hr tablet Take 1 TABLET IN THE MORNING AND 1/2 TABLET IN THE EVENING 135 tablet 0  . Omega-3 Fatty Acids (FISH OIL PO) Take 1 capsule by mouth daily.     No current facility-administered  medications for this visit.    Allergies  Allergen Reactions  . Colchicine Other (See Comments)    GI problems  . Flexeril [Cyclobenzaprine Hcl] Other (See Comments)    GI problems   . Percocet [Oxycodone-Acetaminophen] Nausea And Vomiting  . Zebeta Other (See Comments)    Gi problems    Patient Active Problem List   Diagnosis Date Noted  . Frequent PVCs 11/24/2014  . Intermittent palpitations 07/23/2014  . Chronic cholecystitis with calculus 02/04/2014  . Choledocholithiasis 08/28/2013  . Symptomatic cholelithiasis 06/04/2013  . Diarrhea due to drug 06/04/2013  . Elevated LFTs 06/04/2013  . Benign hypertensive heart disease without heart failure 09/07/2011  . Type II or unspecified type diabetes mellitus without mention of complication, uncontrolled 09/07/2011  . Dyslipidemia 09/07/2011  . Chronic renal insufficiency, stage III (moderate) 09/07/2011    History  Smoking status  . Former Smoker -- 1.00 packs/day for 45 years  . Types: Cigarettes  . Quit date: 09/07/1979  Smokeless tobacco  . Never Used    History  Alcohol Use  . Yes    Comment: beer rarely    Family History  Problem Relation Age of Onset  . Stroke Father   . Stroke Mother   . Stroke Sister   . Stroke Brother   . Stroke Brother   . Stroke Brother     Review of Systems: Constitutional: no fever chills diaphoresis or fatigue or change in weight.  Head and neck: no hearing loss, no epistaxis, no photophobia or visual disturbance. Respiratory: No cough, shortness of breath or wheezing. Cardiovascular: No chest pain peripheral edema, palpitations. Gastrointestinal: No abdominal distention, no abdominal pain, no change in bowel habits hematochezia or melena. Genitourinary: No dysuria, no frequency, no urgency, no nocturia. Musculoskeletal:No arthralgias, no back pain, no gait disturbance or myalgias. Neurological: No dizziness, no headaches, no numbness, no seizures, no syncope, no weakness, no  tremors. Hematologic: No lymphadenopathy, no easy bruising. Psychiatric: No confusion, no hallucinations, no sleep disturbance.    Physical Exam: Filed Vitals:   11/24/14 0833  BP: 124/56  Pulse: 65   The patient appears to be in no distress.  Head and neck exam reveals that the pupils are equal and reactive.  The extraocular movements are full.  There is no scleral icterus.  Mouth and pharynx are benign.  No lymphadenopathy.  No carotid bruits.  The jugular venous pressure is normal.  Thyroid is not enlarged or tender.  Chest is clear to percussion and auscultation.  No rales or rhonchi.  Expansion of the chest is symmetrical.  Heart reveals no abnormal lift or heave.  First and second heart sounds are normal.  There is no murmur gallop rub or click.  An occasional premature beat is audible.  The abdomen is soft and nontender.  Bowel sounds are normoactive.  There is no hepatosplenomegaly or mass.  There are no abdominal bruits.  Extremities reveal no phlebitis or edema.  Pedal pulses are good.  There is no cyanosis or clubbing.  Neurologic exam is normal strength and no lateralizing weakness.  No sensory deficits.  Integument reveals no rash  EKG shows normal sinus rhythm with occasional PVCs and runs of bigeminy  Assessment / Plan: 1. 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 intermittent palpitations secondary to bigeminy PVCs 3. chronic renal insufficiency stage III 4. diabetes mellitus of adult onset 5. past history of gout, on allopurinol. 6. Hypercholesterolemia  Disposition: Continue same medication.  Increase Toprol-XL 52 of full tablet in the morning and half tablet in the evening to help with palpitations. Recheck in 4 months for office visit and fasting lab work

## 2014-12-08 ENCOUNTER — Encounter (HOSPITAL_COMMUNITY)
Admission: RE | Admit: 2014-12-08 | Discharge: 2014-12-08 | Disposition: A | Discharge status: Home / Self Care | Payer: Medicare Other | Source: Ambulatory Visit | Attending: Nephrology | Admitting: Nephrology

## 2014-12-08 DIAGNOSIS — D631 Anemia in chronic kidney disease: Secondary | ICD-10-CM | POA: Insufficient documentation

## 2014-12-08 DIAGNOSIS — N183 Chronic kidney disease, stage 3 (moderate): Secondary | ICD-10-CM | POA: Diagnosis not present

## 2014-12-08 LAB — POCT HEMOGLOBIN-HEMACUE: Hemoglobin: 10.8 g/dL — ABNORMAL LOW (ref 13.0–17.0)

## 2014-12-08 MED ORDER — EPOETIN ALFA 10000 UNIT/ML IJ SOLN
30000.0000 [IU] | INTRAMUSCULAR | Status: DC
  Administered 2014-12-08: 10000 [IU] via SUBCUTANEOUS
  Administered 2014-12-08: 20000 [IU] via SUBCUTANEOUS

## 2014-12-08 MED ORDER — EPOETIN ALFA 20000 UNIT/ML IJ SOLN
INTRAMUSCULAR | Status: AC
  Filled 2014-12-08: qty 1

## 2014-12-08 MED ORDER — EPOETIN ALFA 10000 UNIT/ML IJ SOLN
INTRAMUSCULAR | Status: AC
  Filled 2014-12-08: qty 1

## 2014-12-09 MED FILL — Epoetin Alfa Inj 20000 Unit/ML: INTRAMUSCULAR | Qty: 1 | Status: AC

## 2014-12-15 ENCOUNTER — Other Ambulatory Visit: Payer: Self-pay | Admitting: Cardiology

## 2014-12-29 ENCOUNTER — Encounter (HOSPITAL_COMMUNITY)
Admission: RE | Admit: 2014-12-29 | Discharge: 2014-12-29 | Disposition: A | Discharge status: Home / Self Care | Payer: Medicare Other | Source: Ambulatory Visit | Attending: Nephrology | Admitting: Nephrology

## 2014-12-29 DIAGNOSIS — N183 Chronic kidney disease, stage 3 (moderate): Secondary | ICD-10-CM | POA: Insufficient documentation

## 2014-12-29 DIAGNOSIS — D631 Anemia in chronic kidney disease: Secondary | ICD-10-CM | POA: Insufficient documentation

## 2014-12-29 LAB — RENAL FUNCTION PANEL
Albumin: 3.6 g/dL (ref 3.5–5.2)
Anion gap: 6 (ref 5–15)
BUN: 45 mg/dL — ABNORMAL HIGH (ref 6–23)
CO2: 25 mmol/L (ref 19–32)
CREATININE: 2.53 mg/dL — AB (ref 0.50–1.35)
Calcium: 9.1 mg/dL (ref 8.4–10.5)
Chloride: 106 mmol/L (ref 96–112)
GFR calc Af Amer: 25 mL/min — ABNORMAL LOW (ref >90)
GFR, EST NON AFRICAN AMERICAN: 21 mL/min — AB (ref >90)
Glucose, Bld: 229 mg/dL — ABNORMAL HIGH (ref 70–99)
Phosphorus: 3.5 mg/dL (ref 2.3–4.6)
Potassium: 4.6 mmol/L (ref 3.5–5.1)
Sodium: 137 mmol/L (ref 135–145)

## 2014-12-29 LAB — IRON AND TIBC
IRON: 116 ug/dL (ref 42–165)
Saturation Ratios: 37 % (ref 20–55)
TIBC: 310 ug/dL (ref 215–435)
UIBC: 194 ug/dL (ref 125–400)

## 2014-12-29 LAB — MAGNESIUM: MAGNESIUM: 1.8 mg/dL (ref 1.5–2.5)

## 2014-12-29 LAB — POCT HEMOGLOBIN-HEMACUE: Hemoglobin: 11 g/dL — ABNORMAL LOW (ref 13.0–17.0)

## 2014-12-29 MED ORDER — EPOETIN ALFA 20000 UNIT/ML IJ SOLN
INTRAMUSCULAR | Status: AC
  Administered 2014-12-29: 20000 [IU]
  Filled 2014-12-29: qty 1

## 2014-12-29 MED ORDER — EPOETIN ALFA 10000 UNIT/ML IJ SOLN
INTRAMUSCULAR | Status: AC
  Filled 2014-12-29: qty 1

## 2014-12-29 MED ORDER — EPOETIN ALFA 10000 UNIT/ML IJ SOLN
30000.0000 [IU] | INTRAMUSCULAR | Status: DC
  Administered 2014-12-29: 10000 [IU] via SUBCUTANEOUS

## 2014-12-30 LAB — VITAMIN D 25 HYDROXY (VIT D DEFICIENCY, FRACTURES): VIT D 25 HYDROXY: 34.2 ng/mL (ref 30.0–100.0)

## 2014-12-30 LAB — PTH, INTACT AND CALCIUM
Calcium, Total (PTH): 8.9 mg/dL (ref 8.6–10.2)
PTH: 27 pg/mL (ref 15–65)

## 2014-12-30 LAB — FERRITIN: Ferritin: 47 ng/mL (ref 22–322)

## 2015-01-05 ENCOUNTER — Other Ambulatory Visit: Payer: Self-pay | Admitting: Cardiology

## 2015-01-05 DIAGNOSIS — H04023 Chronic dacryoadenitis, bilateral lacrimal gland: Secondary | ICD-10-CM | POA: Diagnosis not present

## 2015-01-05 DIAGNOSIS — E11329 Type 2 diabetes mellitus with mild nonproliferative diabetic retinopathy without macular edema: Secondary | ICD-10-CM | POA: Diagnosis not present

## 2015-01-05 DIAGNOSIS — H26491 Other secondary cataract, right eye: Secondary | ICD-10-CM | POA: Diagnosis not present

## 2015-01-05 DIAGNOSIS — H524 Presbyopia: Secondary | ICD-10-CM | POA: Diagnosis not present

## 2015-01-05 LAB — HM DIABETES EYE EXAM

## 2015-01-06 ENCOUNTER — Other Ambulatory Visit: Payer: Self-pay | Admitting: Cardiology

## 2015-01-09 DIAGNOSIS — D638 Anemia in other chronic diseases classified elsewhere: Secondary | ICD-10-CM | POA: Diagnosis not present

## 2015-01-09 DIAGNOSIS — I10 Essential (primary) hypertension: Secondary | ICD-10-CM | POA: Diagnosis not present

## 2015-01-09 DIAGNOSIS — N184 Chronic kidney disease, stage 4 (severe): Secondary | ICD-10-CM | POA: Diagnosis not present

## 2015-01-09 DIAGNOSIS — E889 Metabolic disorder, unspecified: Secondary | ICD-10-CM | POA: Diagnosis not present

## 2015-01-19 ENCOUNTER — Encounter (HOSPITAL_COMMUNITY)
Admission: RE | Admit: 2015-01-19 | Discharge: 2015-01-19 | Disposition: A | Discharge status: Home / Self Care | Payer: Medicare Other | Source: Ambulatory Visit | Attending: Nephrology | Admitting: Nephrology

## 2015-01-19 DIAGNOSIS — D631 Anemia in chronic kidney disease: Secondary | ICD-10-CM | POA: Diagnosis not present

## 2015-01-19 DIAGNOSIS — N183 Chronic kidney disease, stage 3 (moderate): Secondary | ICD-10-CM | POA: Diagnosis not present

## 2015-01-19 LAB — RENAL FUNCTION PANEL
ALBUMIN: 3.5 g/dL (ref 3.5–5.2)
Anion gap: 5 (ref 5–15)
BUN: 40 mg/dL — ABNORMAL HIGH (ref 6–23)
CO2: 22 mmol/L (ref 19–32)
CREATININE: 2.43 mg/dL — AB (ref 0.50–1.35)
Calcium: 8.5 mg/dL (ref 8.4–10.5)
Chloride: 110 mmol/L (ref 96–112)
GFR calc Af Amer: 26 mL/min — ABNORMAL LOW (ref >90)
GFR, EST NON AFRICAN AMERICAN: 22 mL/min — AB (ref >90)
Glucose, Bld: 199 mg/dL — ABNORMAL HIGH (ref 70–99)
PHOSPHORUS: 3 mg/dL (ref 2.3–4.6)
POTASSIUM: 4.1 mmol/L (ref 3.5–5.1)
Sodium: 137 mmol/L (ref 135–145)

## 2015-01-19 LAB — POCT HEMOGLOBIN-HEMACUE: HEMOGLOBIN: 11 g/dL — AB (ref 13.0–17.0)

## 2015-01-19 MED ORDER — EPOETIN ALFA 20000 UNIT/ML IJ SOLN
INTRAMUSCULAR | Status: AC
  Administered 2015-01-19: 20000 [IU] via SUBCUTANEOUS
  Filled 2015-01-19: qty 1

## 2015-01-19 MED ORDER — EPOETIN ALFA 10000 UNIT/ML IJ SOLN
INTRAMUSCULAR | Status: AC
  Filled 2015-01-19: qty 1

## 2015-01-19 MED ORDER — EPOETIN ALFA 10000 UNIT/ML IJ SOLN
30000.0000 [IU] | INTRAMUSCULAR | Status: DC
  Administered 2015-01-19: 10000 [IU] via SUBCUTANEOUS

## 2015-02-09 ENCOUNTER — Encounter (HOSPITAL_COMMUNITY)
Admission: RE | Admit: 2015-02-09 | Discharge: 2015-02-09 | Disposition: A | Discharge status: Home / Self Care | Payer: Medicare Other | Source: Ambulatory Visit | Attending: Nephrology | Admitting: Nephrology

## 2015-02-09 DIAGNOSIS — D631 Anemia in chronic kidney disease: Secondary | ICD-10-CM | POA: Insufficient documentation

## 2015-02-09 DIAGNOSIS — N183 Chronic kidney disease, stage 3 (moderate): Secondary | ICD-10-CM | POA: Insufficient documentation

## 2015-02-09 LAB — RENAL FUNCTION PANEL
Albumin: 3.4 g/dL — ABNORMAL LOW (ref 3.5–5.2)
Anion gap: 7 (ref 5–15)
BUN: 41 mg/dL — ABNORMAL HIGH (ref 6–23)
CHLORIDE: 106 mmol/L (ref 96–112)
CO2: 24 mmol/L (ref 19–32)
Calcium: 8.9 mg/dL (ref 8.4–10.5)
Creatinine, Ser: 2.26 mg/dL — ABNORMAL HIGH (ref 0.50–1.35)
GFR calc non Af Amer: 24 mL/min — ABNORMAL LOW (ref >90)
GFR, EST AFRICAN AMERICAN: 28 mL/min — AB (ref >90)
GLUCOSE: 178 mg/dL — AB (ref 70–99)
PHOSPHORUS: 3.3 mg/dL (ref 2.3–4.6)
POTASSIUM: 4.4 mmol/L (ref 3.5–5.1)
SODIUM: 137 mmol/L (ref 135–145)

## 2015-02-09 LAB — IRON AND TIBC
IRON: 104 ug/dL (ref 42–165)
Saturation Ratios: 32 % (ref 20–55)
TIBC: 321 ug/dL (ref 215–435)
UIBC: 217 ug/dL (ref 125–400)

## 2015-02-09 LAB — POCT HEMOGLOBIN-HEMACUE: Hemoglobin: 11 g/dL — ABNORMAL LOW (ref 13.0–17.0)

## 2015-02-09 LAB — FERRITIN: Ferritin: 48 ng/mL (ref 22–322)

## 2015-02-09 MED ORDER — EPOETIN ALFA 10000 UNIT/ML IJ SOLN
30000.0000 [IU] | INTRAMUSCULAR | Status: DC
  Administered 2015-02-09: 30000 [IU] via SUBCUTANEOUS

## 2015-02-09 MED ORDER — EPOETIN ALFA 10000 UNIT/ML IJ SOLN
INTRAMUSCULAR | Status: AC
  Filled 2015-02-09: qty 1

## 2015-02-09 MED ORDER — EPOETIN ALFA 20000 UNIT/ML IJ SOLN
INTRAMUSCULAR | Status: AC
  Filled 2015-02-09: qty 1

## 2015-02-10 MED FILL — Epoetin Alfa Inj 20000 Unit/ML: INTRAMUSCULAR | Qty: 1 | Status: AC

## 2015-02-10 MED FILL — Epoetin Alfa Inj 10000 Unit/ML: INTRAMUSCULAR | Qty: 1 | Status: AC

## 2015-02-28 ENCOUNTER — Other Ambulatory Visit: Payer: Self-pay | Admitting: Cardiology

## 2015-03-02 ENCOUNTER — Encounter (HOSPITAL_COMMUNITY)
Admission: RE | Admit: 2015-03-02 | Discharge: 2015-03-02 | Disposition: A | Discharge status: Home / Self Care | Payer: Medicare Other | Source: Ambulatory Visit | Attending: Nephrology | Admitting: Nephrology

## 2015-03-02 DIAGNOSIS — N183 Chronic kidney disease, stage 3 (moderate): Secondary | ICD-10-CM | POA: Insufficient documentation

## 2015-03-02 DIAGNOSIS — D631 Anemia in chronic kidney disease: Secondary | ICD-10-CM | POA: Insufficient documentation

## 2015-03-02 LAB — POCT HEMOGLOBIN-HEMACUE: Hemoglobin: 11.2 g/dL — ABNORMAL LOW (ref 13.0–17.0)

## 2015-03-02 MED ORDER — EPOETIN ALFA 10000 UNIT/ML IJ SOLN
INTRAMUSCULAR | Status: AC
  Administered 2015-03-02: 10000 [IU] via SUBCUTANEOUS
  Filled 2015-03-02: qty 1

## 2015-03-02 MED ORDER — EPOETIN ALFA 20000 UNIT/ML IJ SOLN
INTRAMUSCULAR | Status: AC
Start: 2015-03-02 — End: 2015-03-02
  Administered 2015-03-02: 20000 [IU] via SUBCUTANEOUS
  Filled 2015-03-02: qty 1

## 2015-03-02 MED ORDER — EPOETIN ALFA 10000 UNIT/ML IJ SOLN
30000.0000 [IU] | INTRAMUSCULAR | Status: DC
  Administered 2015-03-02: 10000 [IU] via SUBCUTANEOUS

## 2015-03-23 ENCOUNTER — Encounter (HOSPITAL_COMMUNITY)
Admission: RE | Admit: 2015-03-23 | Discharge: 2015-03-23 | Disposition: A | Discharge status: Home / Self Care | Payer: Medicare Other | Source: Ambulatory Visit | Attending: Nephrology | Admitting: Nephrology

## 2015-03-23 DIAGNOSIS — N183 Chronic kidney disease, stage 3 (moderate): Secondary | ICD-10-CM | POA: Diagnosis not present

## 2015-03-23 DIAGNOSIS — D631 Anemia in chronic kidney disease: Secondary | ICD-10-CM | POA: Diagnosis not present

## 2015-03-23 LAB — RENAL FUNCTION PANEL
ALBUMIN: 3.6 g/dL (ref 3.5–5.2)
Anion gap: 7 (ref 5–15)
BUN: 37 mg/dL — ABNORMAL HIGH (ref 6–23)
CO2: 24 mmol/L (ref 19–32)
CREATININE: 2.26 mg/dL — AB (ref 0.50–1.35)
Calcium: 9 mg/dL (ref 8.4–10.5)
Chloride: 104 mmol/L (ref 96–112)
GFR calc Af Amer: 28 mL/min — ABNORMAL LOW (ref >90)
GFR, EST NON AFRICAN AMERICAN: 24 mL/min — AB (ref >90)
GLUCOSE: 174 mg/dL — AB (ref 70–99)
PHOSPHORUS: 3 mg/dL (ref 2.3–4.6)
Potassium: 4.8 mmol/L (ref 3.5–5.1)
SODIUM: 135 mmol/L (ref 135–145)

## 2015-03-23 LAB — IRON AND TIBC
Iron: 134 ug/dL (ref 42–165)
Saturation Ratios: 43 % (ref 20–55)
TIBC: 315 ug/dL (ref 215–435)
UIBC: 181 ug/dL (ref 125–400)

## 2015-03-23 LAB — POCT HEMOGLOBIN-HEMACUE: Hemoglobin: 11 g/dL — ABNORMAL LOW (ref 13.0–17.0)

## 2015-03-23 LAB — FERRITIN: Ferritin: 56 ng/mL (ref 22–322)

## 2015-03-23 MED ORDER — EPOETIN ALFA 10000 UNIT/ML IJ SOLN: 30000.0000 [IU] | INTRAMUSCULAR | Status: DC

## 2015-03-23 MED ORDER — EPOETIN ALFA 10000 UNIT/ML IJ SOLN
INTRAMUSCULAR | Status: AC
  Administered 2015-03-23: 10000 [IU] via SUBCUTANEOUS
  Filled 2015-03-23: qty 1

## 2015-03-23 MED ORDER — EPOETIN ALFA 20000 UNIT/ML IJ SOLN
INTRAMUSCULAR | Status: AC
Start: 2015-03-23 — End: 2015-03-23
  Administered 2015-03-23: 20000 [IU] via SUBCUTANEOUS
  Filled 2015-03-23: qty 1

## 2015-03-28 ENCOUNTER — Other Ambulatory Visit: Payer: Self-pay | Admitting: Cardiology

## 2015-03-30 DIAGNOSIS — L570 Actinic keratosis: Secondary | ICD-10-CM | POA: Diagnosis not present

## 2015-03-30 DIAGNOSIS — Z85828 Personal history of other malignant neoplasm of skin: Secondary | ICD-10-CM | POA: Diagnosis not present

## 2015-03-30 DIAGNOSIS — D2271 Melanocytic nevi of right lower limb, including hip: Secondary | ICD-10-CM | POA: Diagnosis not present

## 2015-03-30 DIAGNOSIS — D1801 Hemangioma of skin and subcutaneous tissue: Secondary | ICD-10-CM | POA: Diagnosis not present

## 2015-03-30 DIAGNOSIS — Z85820 Personal history of malignant melanoma of skin: Secondary | ICD-10-CM | POA: Diagnosis not present

## 2015-03-30 DIAGNOSIS — D225 Melanocytic nevi of trunk: Secondary | ICD-10-CM | POA: Diagnosis not present

## 2015-03-30 DIAGNOSIS — L814 Other melanin hyperpigmentation: Secondary | ICD-10-CM | POA: Diagnosis not present

## 2015-03-30 DIAGNOSIS — L821 Other seborrheic keratosis: Secondary | ICD-10-CM | POA: Diagnosis not present

## 2015-04-02 DIAGNOSIS — S0501XA Injury of conjunctiva and corneal abrasion without foreign body, right eye, initial encounter: Secondary | ICD-10-CM | POA: Diagnosis not present

## 2015-04-13 ENCOUNTER — Encounter (HOSPITAL_COMMUNITY)
Admission: RE | Admit: 2015-04-13 | Discharge: 2015-04-13 | Disposition: A | Discharge status: Home / Self Care | Payer: Medicare Other | Source: Ambulatory Visit | Attending: Nephrology | Admitting: Nephrology

## 2015-04-13 DIAGNOSIS — D631 Anemia in chronic kidney disease: Secondary | ICD-10-CM | POA: Insufficient documentation

## 2015-04-13 DIAGNOSIS — N183 Chronic kidney disease, stage 3 (moderate): Secondary | ICD-10-CM | POA: Insufficient documentation

## 2015-04-13 LAB — POCT HEMOGLOBIN-HEMACUE: HEMOGLOBIN: 10.5 g/dL — AB (ref 13.0–17.0)

## 2015-04-13 MED ORDER — EPOETIN ALFA 20000 UNIT/ML IJ SOLN
INTRAMUSCULAR | Status: AC
  Administered 2015-04-13: 20000 [IU]
  Filled 2015-04-13: qty 1

## 2015-04-13 MED ORDER — EPOETIN ALFA 10000 UNIT/ML IJ SOLN
INTRAMUSCULAR | Status: AC
  Filled 2015-04-13: qty 1

## 2015-04-13 MED ORDER — EPOETIN ALFA 10000 UNIT/ML IJ SOLN
30000.0000 [IU] | INTRAMUSCULAR | Status: DC
  Administered 2015-04-13: 10000 [IU] via SUBCUTANEOUS

## 2015-05-04 ENCOUNTER — Encounter (HOSPITAL_COMMUNITY)
Admission: RE | Admit: 2015-05-04 | Discharge: 2015-05-04 | Disposition: A | Discharge status: Home / Self Care | Payer: Medicare Other | Source: Ambulatory Visit | Attending: Nephrology | Admitting: Nephrology

## 2015-05-04 DIAGNOSIS — D631 Anemia in chronic kidney disease: Secondary | ICD-10-CM | POA: Diagnosis not present

## 2015-05-04 DIAGNOSIS — N183 Chronic kidney disease, stage 3 (moderate): Secondary | ICD-10-CM | POA: Insufficient documentation

## 2015-05-04 LAB — RENAL FUNCTION PANEL
Albumin: 3.7 g/dL (ref 3.5–5.0)
Anion gap: 7 (ref 5–15)
BUN: 40 mg/dL — ABNORMAL HIGH (ref 6–20)
CALCIUM: 9.1 mg/dL (ref 8.9–10.3)
CO2: 25 mmol/L (ref 22–32)
CREATININE: 2.32 mg/dL — AB (ref 0.61–1.24)
Chloride: 106 mmol/L (ref 101–111)
GFR calc Af Amer: 27 mL/min — ABNORMAL LOW (ref >60)
GFR calc non Af Amer: 24 mL/min — ABNORMAL LOW (ref >60)
Glucose, Bld: 195 mg/dL — ABNORMAL HIGH (ref 65–99)
Phosphorus: 3 mg/dL (ref 2.5–4.6)
Potassium: 4.5 mmol/L (ref 3.5–5.1)
Sodium: 138 mmol/L (ref 135–145)

## 2015-05-04 LAB — FERRITIN: Ferritin: 39 ng/mL (ref 24–336)

## 2015-05-04 LAB — IRON AND TIBC
Iron: 136 ug/dL (ref 45–182)
Saturation Ratios: 40 % — ABNORMAL HIGH (ref 17.9–39.5)
TIBC: 343 ug/dL (ref 250–450)
UIBC: 207 ug/dL

## 2015-05-04 LAB — POCT HEMOGLOBIN-HEMACUE: Hemoglobin: 11 g/dL — ABNORMAL LOW (ref 13.0–17.0)

## 2015-05-04 MED ORDER — EPOETIN ALFA 20000 UNIT/ML IJ SOLN
INTRAMUSCULAR | Status: AC
  Administered 2015-05-04: 20000 [IU] via SUBCUTANEOUS
  Filled 2015-05-04: qty 1

## 2015-05-04 MED ORDER — EPOETIN ALFA 10000 UNIT/ML IJ SOLN: 30000.0000 [IU] | INTRAMUSCULAR | Status: DC

## 2015-05-04 MED ORDER — EPOETIN ALFA 10000 UNIT/ML IJ SOLN
INTRAMUSCULAR | Status: AC
  Administered 2015-05-04: 10000 [IU] via SUBCUTANEOUS
  Filled 2015-05-04: qty 1

## 2015-05-25 ENCOUNTER — Encounter (HOSPITAL_COMMUNITY)
Admission: RE | Admit: 2015-05-25 | Discharge: 2015-05-25 | Disposition: A | Discharge status: Home / Self Care | Payer: Medicare Other | Source: Ambulatory Visit | Attending: Nephrology | Admitting: Nephrology

## 2015-05-25 DIAGNOSIS — N183 Chronic kidney disease, stage 3 (moderate): Secondary | ICD-10-CM | POA: Diagnosis not present

## 2015-05-25 DIAGNOSIS — D631 Anemia in chronic kidney disease: Secondary | ICD-10-CM | POA: Diagnosis not present

## 2015-05-25 LAB — POCT HEMOGLOBIN-HEMACUE: Hemoglobin: 11 g/dL — ABNORMAL LOW (ref 13.0–17.0)

## 2015-05-25 MED ORDER — EPOETIN ALFA 10000 UNIT/ML IJ SOLN
INTRAMUSCULAR | Status: AC
  Administered 2015-05-25: 10000 [IU] via SUBCUTANEOUS
  Filled 2015-05-25: qty 1

## 2015-05-25 MED ORDER — EPOETIN ALFA 10000 UNIT/ML IJ SOLN
30000.0000 [IU] | INTRAMUSCULAR | Status: DC
  Administered 2015-05-25: 10000 [IU] via SUBCUTANEOUS

## 2015-05-25 MED ORDER — EPOETIN ALFA 20000 UNIT/ML IJ SOLN
INTRAMUSCULAR | Status: AC
  Administered 2015-05-25: 20000 [IU]
  Filled 2015-05-25: qty 1

## 2015-05-26 ENCOUNTER — Encounter: Payer: Self-pay | Admitting: Cardiology

## 2015-05-26 ENCOUNTER — Ambulatory Visit (INDEPENDENT_AMBULATORY_CARE_PROVIDER_SITE_OTHER): Payer: Medicare Other | Admitting: Cardiology

## 2015-05-26 VITALS — BP 150/64 | HR 69 | Ht 68.0 in | Wt 151.8 lb

## 2015-05-26 DIAGNOSIS — I119 Hypertensive heart disease without heart failure: Secondary | ICD-10-CM

## 2015-05-26 DIAGNOSIS — I493 Ventricular premature depolarization: Secondary | ICD-10-CM | POA: Diagnosis not present

## 2015-05-26 DIAGNOSIS — E785 Hyperlipidemia, unspecified: Secondary | ICD-10-CM | POA: Diagnosis not present

## 2015-05-26 DIAGNOSIS — R002 Palpitations: Secondary | ICD-10-CM | POA: Diagnosis not present

## 2015-05-26 NOTE — Patient Instructions (Signed)
Medication Instructions:  Your physician recommends that you continue on your current medications as directed. Please refer to the Current Medication list given to you today.  Labwork: none  Testing/Procedures: none  Follow-Up: Your physician wants you to follow-up in: 4 month ov You will receive a reminder letter in the mail two months in advance. If you don't receive a letter, please call our office to schedule the follow-up appointment.     

## 2015-05-26 NOTE — Progress Notes (Signed)
Cardiology Office Note   Date:  05/26/2015   ID:  Gerald Jacobs, DOB 1925-10-25, MRN [id number]  PCP:  Warren Danes, MD  Cardiologist: Darlin Coco MD  No chief complaint on file.     History of Present Illness: Gerald Jacobs is a 81 y.o. male who presents for a follow-up visit.  This pleasant 81 year old gentleman is seen for a scheduled followup office visit. He has a past history of essential hypertension. He also has a history of dyslipidemia, diabetes, and mild renal insufficiency. Also has a history of hyperuricemia. He has anemia secondary to chronic renal disease and receives ejections of Procrit about every 3 weeks depending on his hemoglobin level. Dr. Posey Pronto is his nephrologist. He is status post cholecystectomy on 02/04/14 by Dr. Zella Richer. Postoperatively he has done well. He has noted occasional palpitations. These seem to be more prevalent at rest and he does not notice them when he is active such as playing golf. The patient has not been experiencing any acute gout or acute arthritis. The patient has not been having any hypoglycemic episodes. Post cholecystectomy the patient is doing well from the digestive standpoint. Since last visit patient has occasional sharp neuritic superficial type discomfort which is very fleeting and can occur anywhere on his body from his trunk to his extremities.  He is not related to exertion.  His last only a few seconds.   Past Medical History  Diagnosis Date  . Essential hypertension   . Mild renal insufficiency   . Diabetes mellitus     Adult onset  . Chronic anemia     With normal iron studies and normal serum protein electrophoresis  . Coronary artery disease     Mild  . History of gout   . Nocturia     x2  . Osteoarthritis   . GERD (gastroesophageal reflux disease)     occ tums  . Cancer     prostate    Past Surgical History  Procedure Laterality Date  . Knee surgery Right 1979  . Prostate surgery   1993    Prostate Cancer  . Melanoma surgery  1970's    on scalp  . Ercp N/A 10/15/2013    Procedure: ENDOSCOPIC RETROGRADE CHOLANGIOPANCREATOGRAPHY (ERCP);  Surgeon: Jeryl Columbia, MD;  Location: [facility];  Service: Endoscopy;  Laterality: N/A;  . Sphincterotomy  10/15/2013    Procedure: SPHINCTEROTOMY;  Surgeon: Jeryl Columbia, MD;  Location: [facility];  Service: Endoscopy;;  . Cholecystectomy  02/04/2014    DR Zella Richer  . Cholecystectomy N/A 02/04/2014    Procedure: LAPAROSCOPIC CHOLECYSTECTOMY with intraoperative cholangiogram;  Surgeon: Odis Hollingshead, MD;  Location: [facility];  Service: General;  Laterality: N/A;     Current Outpatient Prescriptions  Medication Sig Dispense Refill  . allopurinol (ZYLOPRIM) 100 MG tablet Take 100 mg by mouth daily.    Marland Kitchen aspirin 81 MG tablet Take 81 mg by mouth daily.      Marland Kitchen atorvastatin (LIPITOR) 10 MG tablet TAKE 1 TABLET BY MOUTH EVERY DAY 90 tablet 3  . Biotin 1000 MCG tablet Take 1,000 mcg by mouth daily.    . diphenhydramine-acetaminophen (TYLENOL PM) 25-500 MG TABS Take 1 tablet by mouth at bedtime as needed (sleep).    . losartan-hydrochlorothiazide (HYZAAR) 100-12.5 MG per tablet TAKE ONE-HALF TABLETS BY MOUTH DAILY. 45 tablet 3  . metoprolol succinate (TOPROL-XL) 50 MG 24 hr tablet Take 50 mg by mouth. Take 1 tablet by mouth in the  morning and 1/2 tablet by mouth in the evening. Take with or immediately following a meal.    . Omega-3 Fatty Acids (FISH OIL PO) Take 1 capsule by mouth daily.    . sitaGLIPtin (JANUVIA) 50 MG tablet Take 50 mg by mouth daily.     No current facility-administered medications for this visit.    Allergies:   Colchicine; Flexeril; Percocet; and Zebeta    Social History:  The patient  reports that he quit smoking about 35 years ago. His smoking use included Cigarettes. He has a 45 pack-year smoking history. He has never used smokeless tobacco. He reports that he drinks alcohol. He reports that he does not use  illicit drugs.   Family History:  The patient's family history includes Stroke in his brother, brother, brother, father, mother, and sister.    ROS:  Please see the history of present illness.   Otherwise, review of systems are positive for none.   All other systems are reviewed and negative.    PHYSICAL EXAM: VS:  BP 150/64 mmHg  Pulse 69  Ht 5\' 8"  (1.727 m)  Wt 151 lb 12.8 oz (68.856 kg)  BMI 23.09 kg/m2 , BMI Body mass index is 23.09 kg/(m^2). GEN: Well nourished, well developed, in no acute distress HEENT: normal Neck: no JVD, carotid bruits, or masses Cardiac: RRR; no murmurs, rubs, or gallops,no edema  Respiratory:  clear to auscultation bilaterally, normal work of breathing GI: soft, nontender, nondistended, + BS MS: no deformity or atrophy Skin: warm and dry, no rash Neuro:  Strength and sensation are intact Psych: euthymic mood, full affect   EKG:  EKG is not ordered today.    Recent Labs: 07/23/2014: ALT 16 12/29/2014: Magnesium 1.8 05/04/2015: BUN 40*; Creatinine, Ser 2.32*; Potassium 4.5; Sodium 138 05/25/2015: Hemoglobin 11.0*    Lipid Panel    Component Value Date/Time   CHOL 108 07/23/2014 0829   TRIG 141.0 07/23/2014 0829   HDL 52.40 07/23/2014 0829   CHOLHDL 2 07/23/2014 0829   VLDL 28.2 07/23/2014 0829   LDLCALC 27 07/23/2014 0829      Wt Readings from Last 3 Encounters:  05/26/15 151 lb 12.8 oz (68.856 kg)  11/24/14 149 lb (67.586 kg)  07/23/14 146 lb 12.8 oz (66.588 kg)        ASSESSMENT AND PLAN:  1. 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 intermittent palpitations secondary to bigeminy PVCs 3. chronic renal insufficiency stage III 4. diabetes mellitus of adult onset 5. past history of gout, on allopurinol. 6. Hypercholesterolemia  Disposition: Continue current medication.  Recheck in 4 months for follow-up office visit.     Current medicines are reviewed at length with the patient today.  The patient does not  have concerns regarding medicines.  The following changes have been made:  no change  Labs/ tests ordered today include:  No orders of the defined types were placed in this encounter.     Berna Spare MD 05/26/2015 2:20 PM    [address], [place], [place]  [address] Phone: [phone]; Fax: [phone]

## 2015-05-27 DIAGNOSIS — N184 Chronic kidney disease, stage 4 (severe): Secondary | ICD-10-CM | POA: Diagnosis not present

## 2015-05-27 DIAGNOSIS — E889 Metabolic disorder, unspecified: Secondary | ICD-10-CM | POA: Diagnosis not present

## 2015-05-27 DIAGNOSIS — I10 Essential (primary) hypertension: Secondary | ICD-10-CM | POA: Diagnosis not present

## 2015-05-27 DIAGNOSIS — D638 Anemia in other chronic diseases classified elsewhere: Secondary | ICD-10-CM | POA: Diagnosis not present

## 2015-06-02 ENCOUNTER — Other Ambulatory Visit: Payer: Self-pay | Admitting: Cardiology

## 2015-06-15 ENCOUNTER — Encounter (HOSPITAL_COMMUNITY)
Admission: RE | Admit: 2015-06-15 | Discharge: 2015-06-15 | Disposition: A | Discharge status: Home / Self Care | Payer: Medicare Other | Source: Ambulatory Visit | Attending: Nephrology | Admitting: Nephrology

## 2015-06-15 DIAGNOSIS — N183 Chronic kidney disease, stage 3 (moderate): Secondary | ICD-10-CM | POA: Insufficient documentation

## 2015-06-15 DIAGNOSIS — D631 Anemia in chronic kidney disease: Secondary | ICD-10-CM | POA: Insufficient documentation

## 2015-06-15 LAB — RENAL FUNCTION PANEL
ALBUMIN: 3.6 g/dL (ref 3.5–5.0)
Anion gap: 7 (ref 5–15)
BUN: 47 mg/dL — AB (ref 6–20)
CHLORIDE: 107 mmol/L (ref 101–111)
CO2: 22 mmol/L (ref 22–32)
Calcium: 8.7 mg/dL — ABNORMAL LOW (ref 8.9–10.3)
Creatinine, Ser: 2.55 mg/dL — ABNORMAL HIGH (ref 0.61–1.24)
GFR calc Af Amer: 24 mL/min — ABNORMAL LOW (ref >60)
GFR, EST NON AFRICAN AMERICAN: 21 mL/min — AB (ref >60)
Glucose, Bld: 222 mg/dL — ABNORMAL HIGH (ref 65–99)
POTASSIUM: 4.6 mmol/L (ref 3.5–5.1)
Phosphorus: 3.6 mg/dL (ref 2.5–4.6)
Sodium: 136 mmol/L (ref 135–145)

## 2015-06-15 LAB — POCT HEMOGLOBIN-HEMACUE: HEMOGLOBIN: 10.6 g/dL — AB (ref 13.0–17.0)

## 2015-06-15 LAB — IRON AND TIBC
Iron: 130 ug/dL (ref 45–182)
SATURATION RATIOS: 38 % (ref 17.9–39.5)
TIBC: 339 ug/dL (ref 250–450)
UIBC: 209 ug/dL

## 2015-06-15 LAB — FERRITIN: Ferritin: 39 ng/mL (ref 24–336)

## 2015-06-15 MED ORDER — EPOETIN ALFA 10000 UNIT/ML IJ SOLN
30000.0000 [IU] | INTRAMUSCULAR | Status: DC
  Administered 2015-06-15: 10000 [IU] via SUBCUTANEOUS

## 2015-06-15 MED ORDER — EPOETIN ALFA 20000 UNIT/ML IJ SOLN
INTRAMUSCULAR | Status: AC
  Administered 2015-06-15: 20000 [IU]
  Filled 2015-06-15: qty 1

## 2015-06-15 MED ORDER — EPOETIN ALFA 10000 UNIT/ML IJ SOLN
INTRAMUSCULAR | Status: AC
  Filled 2015-06-15: qty 1

## 2015-07-06 ENCOUNTER — Encounter (HOSPITAL_COMMUNITY)
Admission: RE | Admit: 2015-07-06 | Discharge: 2015-07-06 | Disposition: A | Discharge status: Home / Self Care | Payer: Medicare Other | Source: Ambulatory Visit | Attending: Nephrology | Admitting: Nephrology

## 2015-07-06 DIAGNOSIS — N183 Chronic kidney disease, stage 3 (moderate): Secondary | ICD-10-CM | POA: Insufficient documentation

## 2015-07-06 DIAGNOSIS — Z79899 Other long term (current) drug therapy: Secondary | ICD-10-CM | POA: Diagnosis not present

## 2015-07-06 DIAGNOSIS — Z5181 Encounter for therapeutic drug level monitoring: Secondary | ICD-10-CM | POA: Insufficient documentation

## 2015-07-06 DIAGNOSIS — D631 Anemia in chronic kidney disease: Secondary | ICD-10-CM | POA: Diagnosis not present

## 2015-07-06 LAB — POCT HEMOGLOBIN-HEMACUE: Hemoglobin: 11 g/dL — ABNORMAL LOW (ref 13.0–17.0)

## 2015-07-06 MED ORDER — EPOETIN ALFA 10000 UNIT/ML IJ SOLN
30000.0000 [IU] | INTRAMUSCULAR | Status: DC
  Administered 2015-07-06: 10000 [IU] via SUBCUTANEOUS

## 2015-07-06 MED ORDER — EPOETIN ALFA 10000 UNIT/ML IJ SOLN
INTRAMUSCULAR | Status: AC
  Administered 2015-07-06: 10000 [IU] via SUBCUTANEOUS
  Filled 2015-07-06: qty 1

## 2015-07-06 MED ORDER — EPOETIN ALFA 20000 UNIT/ML IJ SOLN
INTRAMUSCULAR | Status: AC
  Administered 2015-07-06: 20000 [IU] via SUBCUTANEOUS
  Filled 2015-07-06: qty 1

## 2015-07-27 ENCOUNTER — Encounter (HOSPITAL_COMMUNITY)
Admission: RE | Admit: 2015-07-27 | Discharge: 2015-07-27 | Disposition: A | Discharge status: Home / Self Care | Payer: Medicare Other | Source: Ambulatory Visit | Attending: Nephrology | Admitting: Nephrology

## 2015-07-27 DIAGNOSIS — Z79899 Other long term (current) drug therapy: Secondary | ICD-10-CM | POA: Diagnosis not present

## 2015-07-27 DIAGNOSIS — D631 Anemia in chronic kidney disease: Secondary | ICD-10-CM | POA: Diagnosis not present

## 2015-07-27 DIAGNOSIS — Z5181 Encounter for therapeutic drug level monitoring: Secondary | ICD-10-CM | POA: Diagnosis not present

## 2015-07-27 DIAGNOSIS — N183 Chronic kidney disease, stage 3 (moderate): Secondary | ICD-10-CM | POA: Diagnosis not present

## 2015-07-27 LAB — IRON AND TIBC
Iron: 123 ug/dL (ref 45–182)
SATURATION RATIOS: 36 % (ref 17.9–39.5)
TIBC: 343 ug/dL (ref 250–450)
UIBC: 220 ug/dL

## 2015-07-27 LAB — FERRITIN: Ferritin: 41 ng/mL (ref 24–336)

## 2015-07-27 LAB — POCT HEMOGLOBIN-HEMACUE: Hemoglobin: 10.7 g/dL — ABNORMAL LOW (ref 13.0–17.0)

## 2015-07-27 MED ORDER — EPOETIN ALFA 10000 UNIT/ML IJ SOLN
INTRAMUSCULAR | Status: AC
  Filled 2015-07-27: qty 1

## 2015-07-27 MED ORDER — EPOETIN ALFA 10000 UNIT/ML IJ SOLN
30000.0000 [IU] | INTRAMUSCULAR | Status: DC
  Administered 2015-07-27: 10000 [IU] via SUBCUTANEOUS

## 2015-07-27 MED ORDER — EPOETIN ALFA 20000 UNIT/ML IJ SOLN
INTRAMUSCULAR | Status: AC
  Administered 2015-07-27: 20000 [IU] via SUBCUTANEOUS
  Filled 2015-07-27: qty 1

## 2015-08-17 ENCOUNTER — Encounter (HOSPITAL_COMMUNITY)
Admission: RE | Admit: 2015-08-17 | Discharge: 2015-08-17 | Disposition: A | Discharge status: Home / Self Care | Payer: Medicare Other | Source: Ambulatory Visit | Attending: Nephrology | Admitting: Nephrology

## 2015-08-17 DIAGNOSIS — N183 Chronic kidney disease, stage 3 (moderate): Secondary | ICD-10-CM | POA: Diagnosis not present

## 2015-08-17 DIAGNOSIS — D631 Anemia in chronic kidney disease: Secondary | ICD-10-CM | POA: Diagnosis not present

## 2015-08-17 LAB — RENAL FUNCTION PANEL
ANION GAP: 10 (ref 5–15)
Albumin: 3.7 g/dL (ref 3.5–5.0)
BUN: 40 mg/dL — ABNORMAL HIGH (ref 6–20)
CHLORIDE: 110 mmol/L (ref 101–111)
CO2: 22 mmol/L (ref 22–32)
Calcium: 9.3 mg/dL (ref 8.9–10.3)
Creatinine, Ser: 2.44 mg/dL — ABNORMAL HIGH (ref 0.61–1.24)
GFR calc non Af Amer: 22 mL/min — ABNORMAL LOW (ref >60)
GFR, EST AFRICAN AMERICAN: 26 mL/min — AB (ref >60)
Glucose, Bld: 140 mg/dL — ABNORMAL HIGH (ref 65–99)
POTASSIUM: 3.9 mmol/L (ref 3.5–5.1)
Phosphorus: 3.9 mg/dL (ref 2.5–4.6)
Sodium: 142 mmol/L (ref 135–145)

## 2015-08-17 LAB — POCT HEMOGLOBIN-HEMACUE: Hemoglobin: 10.7 g/dL — ABNORMAL LOW (ref 13.0–17.0)

## 2015-08-17 MED ORDER — EPOETIN ALFA 20000 UNIT/ML IJ SOLN
INTRAMUSCULAR | Status: AC
  Administered 2015-08-17: 20000 [IU]
  Filled 2015-08-17: qty 1

## 2015-08-17 MED ORDER — EPOETIN ALFA 10000 UNIT/ML IJ SOLN
INTRAMUSCULAR | Status: AC
  Administered 2015-08-17: 10000 [IU]
  Filled 2015-08-17: qty 1

## 2015-08-17 MED ORDER — EPOETIN ALFA 10000 UNIT/ML IJ SOLN: 30000.0000 [IU] | INTRAMUSCULAR | Status: DC

## 2015-08-30 ENCOUNTER — Other Ambulatory Visit: Payer: Self-pay | Admitting: Cardiology

## 2015-09-07 ENCOUNTER — Encounter (HOSPITAL_COMMUNITY)
Admission: RE | Admit: 2015-09-07 | Discharge: 2015-09-07 | Disposition: A | Discharge status: Home / Self Care | Payer: Medicare Other | Source: Ambulatory Visit | Attending: Nephrology | Admitting: Nephrology

## 2015-09-07 DIAGNOSIS — D631 Anemia in chronic kidney disease: Secondary | ICD-10-CM | POA: Insufficient documentation

## 2015-09-07 DIAGNOSIS — N183 Chronic kidney disease, stage 3 (moderate): Secondary | ICD-10-CM | POA: Insufficient documentation

## 2015-09-07 LAB — POCT HEMOGLOBIN-HEMACUE: HEMOGLOBIN: 10 g/dL — AB (ref 13.0–17.0)

## 2015-09-07 LAB — IRON AND TIBC
Iron: 86 ug/dL (ref 45–182)
Saturation Ratios: 24 % (ref 17.9–39.5)
TIBC: 351 ug/dL (ref 250–450)
UIBC: 265 ug/dL

## 2015-09-07 LAB — FERRITIN: Ferritin: 44 ng/mL (ref 24–336)

## 2015-09-07 MED ORDER — EPOETIN ALFA 10000 UNIT/ML IJ SOLN
INTRAMUSCULAR | Status: AC
  Administered 2015-09-07: 10000 [IU]
  Filled 2015-09-07: qty 1

## 2015-09-07 MED ORDER — EPOETIN ALFA 20000 UNIT/ML IJ SOLN
INTRAMUSCULAR | Status: AC
  Administered 2015-09-07: 20000 [IU]
  Filled 2015-09-07: qty 1

## 2015-09-25 DIAGNOSIS — N184 Chronic kidney disease, stage 4 (severe): Secondary | ICD-10-CM | POA: Diagnosis not present

## 2015-09-25 DIAGNOSIS — E889 Metabolic disorder, unspecified: Secondary | ICD-10-CM | POA: Diagnosis not present

## 2015-09-25 DIAGNOSIS — M9080 Osteopathy in diseases classified elsewhere, unspecified site: Secondary | ICD-10-CM | POA: Diagnosis not present

## 2015-09-25 DIAGNOSIS — I10 Essential (primary) hypertension: Secondary | ICD-10-CM | POA: Diagnosis not present

## 2015-09-25 DIAGNOSIS — D638 Anemia in other chronic diseases classified elsewhere: Secondary | ICD-10-CM | POA: Diagnosis not present

## 2015-09-28 ENCOUNTER — Encounter (HOSPITAL_COMMUNITY)
Admission: RE | Admit: 2015-09-28 | Discharge: 2015-09-28 | Disposition: A | Discharge status: Home / Self Care | Payer: Medicare Other | Source: Ambulatory Visit | Attending: Nephrology | Admitting: Nephrology

## 2015-09-28 DIAGNOSIS — N183 Chronic kidney disease, stage 3 (moderate): Secondary | ICD-10-CM | POA: Diagnosis not present

## 2015-09-28 DIAGNOSIS — D631 Anemia in chronic kidney disease: Secondary | ICD-10-CM | POA: Diagnosis not present

## 2015-09-28 LAB — POCT HEMOGLOBIN-HEMACUE: Hemoglobin: 11.3 g/dL — ABNORMAL LOW (ref 13.0–17.0)

## 2015-09-28 MED ORDER — EPOETIN ALFA 20000 UNIT/ML IJ SOLN
INTRAMUSCULAR | Status: AC
  Administered 2015-09-28: 20000 [IU] via SUBCUTANEOUS
  Filled 2015-09-28: qty 1

## 2015-09-28 MED ORDER — SODIUM CHLORIDE 0.9 % IV SOLN
510.0000 mg | INTRAVENOUS | Status: DC
  Administered 2015-09-28: 510 mg via INTRAVENOUS
  Filled 2015-09-28: qty 17

## 2015-09-28 MED ORDER — EPOETIN ALFA 10000 UNIT/ML IJ SOLN
INTRAMUSCULAR | Status: AC
  Administered 2015-09-28: 10000 [IU] via SUBCUTANEOUS
  Filled 2015-09-28: qty 1

## 2015-09-28 MED ORDER — EPOETIN ALFA 10000 UNIT/ML IJ SOLN: 30000.0000 [IU] | INTRAMUSCULAR | Status: DC

## 2015-09-30 ENCOUNTER — Encounter: Payer: Self-pay | Admitting: Cardiology

## 2015-09-30 ENCOUNTER — Ambulatory Visit (INDEPENDENT_AMBULATORY_CARE_PROVIDER_SITE_OTHER): Payer: Medicare Other | Admitting: Cardiology

## 2015-09-30 VITALS — BP 140/68 | HR 63 | Ht 68.0 in | Wt 152.1 lb

## 2015-09-30 DIAGNOSIS — L814 Other melanin hyperpigmentation: Secondary | ICD-10-CM | POA: Diagnosis not present

## 2015-09-30 DIAGNOSIS — Z23 Encounter for immunization: Secondary | ICD-10-CM

## 2015-09-30 DIAGNOSIS — N189 Chronic kidney disease, unspecified: Secondary | ICD-10-CM

## 2015-09-30 DIAGNOSIS — E7800 Pure hypercholesterolemia, unspecified: Secondary | ICD-10-CM

## 2015-09-30 DIAGNOSIS — N183 Chronic kidney disease, stage 3 unspecified: Secondary | ICD-10-CM

## 2015-09-30 DIAGNOSIS — L821 Other seborrheic keratosis: Secondary | ICD-10-CM | POA: Diagnosis not present

## 2015-09-30 DIAGNOSIS — D692 Other nonthrombocytopenic purpura: Secondary | ICD-10-CM | POA: Diagnosis not present

## 2015-09-30 DIAGNOSIS — Z85820 Personal history of malignant melanoma of skin: Secondary | ICD-10-CM | POA: Diagnosis not present

## 2015-09-30 DIAGNOSIS — Z85828 Personal history of other malignant neoplasm of skin: Secondary | ICD-10-CM | POA: Diagnosis not present

## 2015-09-30 DIAGNOSIS — I119 Hypertensive heart disease without heart failure: Secondary | ICD-10-CM | POA: Diagnosis not present

## 2015-09-30 DIAGNOSIS — D2271 Melanocytic nevi of right lower limb, including hip: Secondary | ICD-10-CM | POA: Diagnosis not present

## 2015-09-30 DIAGNOSIS — L304 Erythema intertrigo: Secondary | ICD-10-CM | POA: Diagnosis not present

## 2015-09-30 DIAGNOSIS — L853 Xerosis cutis: Secondary | ICD-10-CM | POA: Diagnosis not present

## 2015-09-30 NOTE — Patient Instructions (Signed)
Medication Instructions:  Your physician recommends that you continue on your current medications as directed. Please refer to the Current Medication list given to you today.  Labwork: none  Testing/Procedures: none  Follow-Up: Your physician recommends that you schedule a follow-up appointment in: 4 month ov/fasting lp/bmet/hfp with Tera Helper NP or Brynda Rim PA  If you need a refill on your cardiac medications before your next appointment, please call your pharmacy.  SEE ATTACHED LIST OF [facility] AND NUMBER TO CALL FOR APPOINTMENT

## 2015-09-30 NOTE — Progress Notes (Addendum)
Cardiology Office Note   Date:  09/30/2015   ID:  Gerald Jacobs, DOB [birth year], MRN [id number]  PCP:  Warren Danes, MD  Cardiologist: Darlin Coco MD  Chief Complaint  Patient presents with  . Follow-up      History of Present Illness: Gerald Jacobs is a 81 y.o. male who presents for a four-month follow-up visit  This pleasant 81 year old gentleman is seen for a scheduled followup office visit. He has a past history of essential hypertension. He also has a history of dyslipidemia, diabetes, and mild renal insufficiency. Also has a history of hyperuricemia. He has anemia secondary to chronic renal disease and receives ejections of Procrit about every 3 weeks depending on his hemoglobin level. Dr. Posey Pronto is his nephrologist. He is status post cholecystectomy on 02/04/14 by Dr. Zella Richer. Postoperatively he has done well.  Post cholecystectomy if he eats a lot of fried food he will have diarrhea the next day. He has noted occasional palpitations. These seem to be more prevalent at rest and he does not notice them when he is active such as playing golf. The patient has not been experiencing any acute gout or acute arthritis. The patient has not been having any hypoglycemic episodes. Post cholecystectomy the patient is doing well from the digestive standpoint.   Past Medical History  Diagnosis Date  . Essential hypertension   . Mild renal insufficiency   . Diabetes mellitus     Adult onset  . Chronic anemia     With normal iron studies and normal serum protein electrophoresis  . Coronary artery disease     Mild  . History of gout   . Nocturia     x2  . Osteoarthritis   . GERD (gastroesophageal reflux disease)     occ tums  . Cancer [facility])     prostate    Past Surgical History  Procedure Laterality Date  . Knee surgery Right 1979  . Prostate surgery  1993    Prostate Cancer  . Melanoma surgery  1970's    on scalp  . Ercp N/A 10/15/2013    Procedure:  ENDOSCOPIC RETROGRADE CHOLANGIOPANCREATOGRAPHY (ERCP);  Surgeon: Jeryl Columbia, MD;  Location: [facility];  Service: Endoscopy;  Laterality: N/A;  . Sphincterotomy  10/15/2013    Procedure: SPHINCTEROTOMY;  Surgeon: Jeryl Columbia, MD;  Location: [facility];  Service: Endoscopy;;  . Cholecystectomy  02/04/2014    DR Zella Richer  . Cholecystectomy N/A 02/04/2014    Procedure: LAPAROSCOPIC CHOLECYSTECTOMY with intraoperative cholangiogram;  Surgeon: Odis Hollingshead, MD;  Location: [place];  Service: General;  Laterality: N/A;     Current Outpatient Prescriptions  Medication Sig Dispense Refill  . allopurinol (ZYLOPRIM) 100 MG tablet Take 100 mg by mouth daily.    Marland Kitchen aspirin 81 MG tablet Take 81 mg by mouth daily.      Marland Kitchen atorvastatin (LIPITOR) 10 MG tablet TAKE 1 TABLET BY MOUTH EVERY DAY 90 tablet 3  . Biotin 1000 MCG tablet Take 1,000 mcg by mouth daily.    . diphenhydramine-acetaminophen (TYLENOL PM) 25-500 MG TABS Take 1 tablet by mouth at bedtime as needed (sleep).    . losartan-hydrochlorothiazide (HYZAAR) 100-12.5 MG tablet TAKE 1/2 TABLET BY MOUTH DAILY 45 tablet 1  . metoprolol succinate (TOPROL-XL) 50 MG 24 hr tablet Take 50 mg by mouth. Take 1 tablet by mouth in the morning and 1/2 tablet by mouth in the evening. Take with or immediately following a meal.    .  Omega-3 Fatty Acids (FISH OIL PO) Take 1 capsule by mouth daily.    . sitaGLIPtin (JANUVIA) 50 MG tablet Take 50 mg by mouth daily.     No current facility-administered medications for this visit.    Allergies:   Colchicine; Flexeril; Percocet; and Zebeta    Social History:  The patient  reports that he quit smoking about 36 years ago. His smoking use included Cigarettes. He has a 45 pack-year smoking history. He has never used smokeless tobacco. He reports that he drinks alcohol. He reports that he does not use illicit drugs.   Family History:  The patient's family history includes Stroke in his brother, brother, brother,  father, mother, and sister.    ROS:  Please see the history of present illness.   Otherwise, review of systems are positive for none.   All other systems are reviewed and negative.    PHYSICAL EXAM: VS:  BP 140/68 mmHg  Pulse 63  Ht 5\' 8"  (1.727 m)  Wt 152 lb 1.9 oz (69.001 kg)  BMI 23.14 kg/m2 , BMI Body mass index is 23.14 kg/(m^2). GEN: Well nourished, well developed, in no acute distress HEENT: normal Neck: no JVD, carotid bruits, or masses Cardiac: RRR; no murmurs, rubs, or gallops,no edema  Respiratory:  clear to auscultation bilaterally, normal work of breathing GI: soft, nontender, nondistended, + BS MS: no deformity or atrophy Skin: warm and dry, no rash Neuro:  Strength and sensation are intact Psych: euthymic mood, full affect   EKG:  EKG is  ordered today.  EKG shows normal sinus rhythm and no ischemic changes.   Recent Labs: 12/29/2014: Magnesium 1.8 08/17/2015: BUN 40*; Creatinine, Ser 2.44*; Potassium 3.9; Sodium 142 09/28/2015: Hemoglobin 11.3*    Lipid Panel    Component Value Date/Time   CHOL 108 07/23/2014 0829   TRIG 141.0 07/23/2014 0829   HDL 52.40 07/23/2014 0829   CHOLHDL 2 07/23/2014 0829   VLDL 28.2 07/23/2014 0829   LDLCALC 27 07/23/2014 0829      Wt Readings from Last 3 Encounters:  09/30/15 152 lb 1.9 oz (69.001 kg)  05/26/15 151 lb 12.8 oz (68.856 kg)  11/24/14 149 lb (67.586 kg)        ASSESSMENT AND PLAN:  1. 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 intermittent palpitations secondary to bigeminy PVCs 3. chronic renal insufficiency stage III 4. diabetes mellitus of adult onset 5. past history of gout, on allopurinol. 6. Hypercholesterolemia  Disposition: Continue current medication. Recheck in 4 months for follow-up office visit. He will also need a new PCP.  He will try to establish at [facility].   Current medicines are reviewed at length with the patient today.  The patient does not have concerns  regarding medicines.  The following changes have been made:  no change  Labs/ tests ordered today include:   Orders Placed This Encounter  Procedures  . Flu Vaccine QUAD 36+ mos IM  . EKG 12-Lead      Signed, Darlin Coco MD 09/30/2015 5:27 PM    [facility] [address], [place], [place]  [address] Phone: [phone]; Fax: [phone]

## 2015-10-05 ENCOUNTER — Encounter (HOSPITAL_COMMUNITY)
Admission: RE | Admit: 2015-10-05 | Discharge: 2015-10-05 | Disposition: A | Discharge status: Home / Self Care | Payer: Medicare Other | Source: Ambulatory Visit | Attending: Nephrology | Admitting: Nephrology

## 2015-10-05 DIAGNOSIS — D631 Anemia in chronic kidney disease: Secondary | ICD-10-CM | POA: Insufficient documentation

## 2015-10-05 DIAGNOSIS — N183 Chronic kidney disease, stage 3 (moderate): Secondary | ICD-10-CM | POA: Diagnosis not present

## 2015-10-05 MED ORDER — SODIUM CHLORIDE 0.9 % IV SOLN
510.0000 mg | INTRAVENOUS | Status: DC
  Administered 2015-10-05: 510 mg via INTRAVENOUS
  Filled 2015-10-05: qty 17

## 2015-10-19 ENCOUNTER — Encounter (HOSPITAL_COMMUNITY)
Admission: RE | Admit: 2015-10-19 | Discharge: 2015-10-19 | Disposition: A | Discharge status: Home / Self Care | Payer: Medicare Other | Source: Ambulatory Visit | Attending: Nephrology | Admitting: Nephrology

## 2015-10-19 DIAGNOSIS — D631 Anemia in chronic kidney disease: Secondary | ICD-10-CM | POA: Diagnosis not present

## 2015-10-19 DIAGNOSIS — N183 Chronic kidney disease, stage 3 (moderate): Secondary | ICD-10-CM | POA: Diagnosis not present

## 2015-10-19 LAB — RENAL FUNCTION PANEL
ALBUMIN: 3.6 g/dL (ref 3.5–5.0)
ANION GAP: 7 (ref 5–15)
BUN: 36 mg/dL — AB (ref 6–20)
CO2: 25 mmol/L (ref 22–32)
Calcium: 8.7 mg/dL — ABNORMAL LOW (ref 8.9–10.3)
Chloride: 105 mmol/L (ref 101–111)
Creatinine, Ser: 2.36 mg/dL — ABNORMAL HIGH (ref 0.61–1.24)
GFR, EST AFRICAN AMERICAN: 27 mL/min — AB (ref >60)
GFR, EST NON AFRICAN AMERICAN: 23 mL/min — AB (ref >60)
Glucose, Bld: 174 mg/dL — ABNORMAL HIGH (ref 65–99)
PHOSPHORUS: 3.2 mg/dL (ref 2.5–4.6)
POTASSIUM: 4.5 mmol/L (ref 3.5–5.1)
Sodium: 137 mmol/L (ref 135–145)

## 2015-10-19 LAB — IRON AND TIBC
Iron: 143 ug/dL (ref 45–182)
Saturation Ratios: 50 % — ABNORMAL HIGH (ref 17.9–39.5)
TIBC: 286 ug/dL (ref 250–450)
UIBC: 143 ug/dL

## 2015-10-19 LAB — FERRITIN: Ferritin: 502 ng/mL — ABNORMAL HIGH (ref 24–336)

## 2015-10-19 LAB — POCT HEMOGLOBIN-HEMACUE: HEMOGLOBIN: 10.7 g/dL — AB (ref 13.0–17.0)

## 2015-10-19 MED ORDER — EPOETIN ALFA 10000 UNIT/ML IJ SOLN
30000.0000 [IU] | INTRAMUSCULAR | Status: DC
  Administered 2015-10-19: 10000 [IU] via SUBCUTANEOUS

## 2015-10-19 MED ORDER — EPOETIN ALFA 20000 UNIT/ML IJ SOLN
INTRAMUSCULAR | Status: AC
  Administered 2015-10-19: 20000 [IU]
  Filled 2015-10-19: qty 1

## 2015-10-19 MED ORDER — EPOETIN ALFA 10000 UNIT/ML IJ SOLN
INTRAMUSCULAR | Status: AC
  Filled 2015-10-19: qty 1

## 2015-11-06 ENCOUNTER — Other Ambulatory Visit (HOSPITAL_COMMUNITY): Payer: Self-pay | Admitting: *Deleted

## 2015-11-09 ENCOUNTER — Encounter (HOSPITAL_COMMUNITY)
Admission: RE | Admit: 2015-11-09 | Discharge: 2015-11-09 | Disposition: A | Discharge status: Home / Self Care | Payer: Medicare Other | Source: Ambulatory Visit | Attending: Nephrology | Admitting: Nephrology

## 2015-11-09 DIAGNOSIS — N183 Chronic kidney disease, stage 3 (moderate): Secondary | ICD-10-CM | POA: Diagnosis not present

## 2015-11-09 DIAGNOSIS — D631 Anemia in chronic kidney disease: Secondary | ICD-10-CM | POA: Insufficient documentation

## 2015-11-09 LAB — POCT HEMOGLOBIN-HEMACUE: HEMOGLOBIN: 10.7 g/dL — AB (ref 13.0–17.0)

## 2015-11-09 MED ORDER — EPOETIN ALFA 20000 UNIT/ML IJ SOLN
INTRAMUSCULAR | Status: AC
  Administered 2015-11-09: 20000 [IU]
  Filled 2015-11-09: qty 1

## 2015-11-09 MED ORDER — EPOETIN ALFA 10000 UNIT/ML IJ SOLN: 30000.0000 [IU] | INTRAMUSCULAR | Status: DC

## 2015-11-09 MED ORDER — EPOETIN ALFA 10000 UNIT/ML IJ SOLN
INTRAMUSCULAR | Status: AC
  Administered 2015-11-09: 10000 [IU]
  Filled 2015-11-09: qty 1

## 2015-12-01 ENCOUNTER — Encounter (HOSPITAL_COMMUNITY)
Admission: RE | Admit: 2015-12-01 | Discharge: 2015-12-01 | Disposition: A | Discharge status: Home / Self Care | Payer: Medicare Other | Source: Ambulatory Visit | Attending: Nephrology | Admitting: Nephrology

## 2015-12-01 DIAGNOSIS — D631 Anemia in chronic kidney disease: Secondary | ICD-10-CM | POA: Diagnosis not present

## 2015-12-01 DIAGNOSIS — N183 Chronic kidney disease, stage 3 (moderate): Secondary | ICD-10-CM | POA: Insufficient documentation

## 2015-12-01 LAB — IRON AND TIBC
Iron: 129 ug/dL (ref 45–182)
Saturation Ratios: 47 % — ABNORMAL HIGH (ref 17.9–39.5)
TIBC: 272 ug/dL (ref 250–450)
UIBC: 143 ug/dL

## 2015-12-01 LAB — RENAL FUNCTION PANEL
Albumin: 3.7 g/dL (ref 3.5–5.0)
Anion gap: 9 (ref 5–15)
BUN: 37 mg/dL — AB (ref 6–20)
CHLORIDE: 107 mmol/L (ref 101–111)
CO2: 24 mmol/L (ref 22–32)
CREATININE: 2.35 mg/dL — AB (ref 0.61–1.24)
Calcium: 9.2 mg/dL (ref 8.9–10.3)
GFR calc Af Amer: 27 mL/min — ABNORMAL LOW (ref >60)
GFR calc non Af Amer: 23 mL/min — ABNORMAL LOW (ref >60)
Glucose, Bld: 163 mg/dL — ABNORMAL HIGH (ref 65–99)
Phosphorus: 2.9 mg/dL (ref 2.5–4.6)
Potassium: 4.5 mmol/L (ref 3.5–5.1)
Sodium: 140 mmol/L (ref 135–145)

## 2015-12-01 LAB — FERRITIN: Ferritin: 207 ng/mL (ref 24–336)

## 2015-12-01 LAB — POCT HEMOGLOBIN-HEMACUE: Hemoglobin: 11.4 g/dL — ABNORMAL LOW (ref 13.0–17.0)

## 2015-12-01 MED ORDER — EPOETIN ALFA 10000 UNIT/ML IJ SOLN: 30000.0000 [IU] | INTRAMUSCULAR | Status: DC

## 2015-12-01 MED ORDER — EPOETIN ALFA 20000 UNIT/ML IJ SOLN
INTRAMUSCULAR | Status: AC
  Administered 2015-12-01: 20000 [IU]
  Filled 2015-12-01: qty 1

## 2015-12-01 MED ORDER — EPOETIN ALFA 10000 UNIT/ML IJ SOLN
INTRAMUSCULAR | Status: AC
  Administered 2015-12-01: 10000 [IU]
  Filled 2015-12-01: qty 1

## 2015-12-21 ENCOUNTER — Encounter (HOSPITAL_COMMUNITY)
Admission: RE | Admit: 2015-12-21 | Discharge: 2015-12-21 | Disposition: A | Discharge status: Home / Self Care | Payer: Medicare Other | Source: Ambulatory Visit | Attending: Nephrology | Admitting: Nephrology

## 2015-12-21 DIAGNOSIS — D631 Anemia in chronic kidney disease: Secondary | ICD-10-CM | POA: Diagnosis not present

## 2015-12-21 LAB — POCT HEMOGLOBIN-HEMACUE: HEMOGLOBIN: 11.3 g/dL — AB (ref 13.0–17.0)

## 2015-12-21 MED ORDER — EPOETIN ALFA 20000 UNIT/ML IJ SOLN
INTRAMUSCULAR | Status: AC
  Administered 2015-12-21: 20000 [IU]
  Filled 2015-12-21: qty 1

## 2015-12-21 MED ORDER — EPOETIN ALFA 10000 UNIT/ML IJ SOLN: 30000.0000 [IU] | INTRAMUSCULAR | Status: DC

## 2015-12-21 MED ORDER — EPOETIN ALFA 10000 UNIT/ML IJ SOLN
INTRAMUSCULAR | Status: AC
  Administered 2015-12-21: 10000 [IU]
  Filled 2015-12-21: qty 1

## 2015-12-25 ENCOUNTER — Other Ambulatory Visit: Payer: Self-pay | Admitting: Cardiology

## 2016-01-11 ENCOUNTER — Encounter (HOSPITAL_COMMUNITY)
Admission: RE | Admit: 2016-01-11 | Discharge: 2016-01-11 | Disposition: A | Discharge status: Home / Self Care | Payer: Medicare Other | Source: Ambulatory Visit | Attending: Nephrology | Admitting: Nephrology

## 2016-01-11 DIAGNOSIS — N183 Chronic kidney disease, stage 3 (moderate): Secondary | ICD-10-CM | POA: Insufficient documentation

## 2016-01-11 DIAGNOSIS — D631 Anemia in chronic kidney disease: Secondary | ICD-10-CM | POA: Diagnosis not present

## 2016-01-11 LAB — RENAL FUNCTION PANEL
ANION GAP: 12 (ref 5–15)
Albumin: 3.4 g/dL — ABNORMAL LOW (ref 3.5–5.0)
BUN: 45 mg/dL — ABNORMAL HIGH (ref 6–20)
CHLORIDE: 104 mmol/L (ref 101–111)
CO2: 23 mmol/L (ref 22–32)
Calcium: 9.2 mg/dL (ref 8.9–10.3)
Creatinine, Ser: 2.42 mg/dL — ABNORMAL HIGH (ref 0.61–1.24)
GFR calc Af Amer: 26 mL/min — ABNORMAL LOW (ref >60)
GFR calc non Af Amer: 22 mL/min — ABNORMAL LOW (ref >60)
GLUCOSE: 195 mg/dL — AB (ref 65–99)
PHOSPHORUS: 3.2 mg/dL (ref 2.5–4.6)
POTASSIUM: 4.4 mmol/L (ref 3.5–5.1)
Sodium: 139 mmol/L (ref 135–145)

## 2016-01-11 LAB — IRON AND TIBC
IRON: 133 ug/dL (ref 45–182)
SATURATION RATIOS: 45 % — AB (ref 17.9–39.5)
TIBC: 294 ug/dL (ref 250–450)
UIBC: 161 ug/dL

## 2016-01-11 LAB — FERRITIN: Ferritin: 162 ng/mL (ref 24–336)

## 2016-01-11 LAB — POCT HEMOGLOBIN-HEMACUE: Hemoglobin: 11.3 g/dL — ABNORMAL LOW (ref 13.0–17.0)

## 2016-01-11 MED ORDER — EPOETIN ALFA 10000 UNIT/ML IJ SOLN: 30000.0000 [IU] | INTRAMUSCULAR | Status: DC

## 2016-01-11 MED ORDER — EPOETIN ALFA 10000 UNIT/ML IJ SOLN
INTRAMUSCULAR | Status: AC
  Administered 2016-01-11: 10000 [IU] via SUBCUTANEOUS
  Filled 2016-01-11: qty 1

## 2016-01-11 MED ORDER — EPOETIN ALFA 20000 UNIT/ML IJ SOLN
INTRAMUSCULAR | Status: AC
  Administered 2016-01-11: 20000 [IU] via SUBCUTANEOUS
  Filled 2016-01-11: qty 1

## 2016-01-18 ENCOUNTER — Encounter: Payer: Self-pay | Admitting: Family

## 2016-01-18 ENCOUNTER — Ambulatory Visit (INDEPENDENT_AMBULATORY_CARE_PROVIDER_SITE_OTHER)
Admission: RE | Admit: 2016-01-18 | Discharge: 2016-01-18 | Disposition: A | Discharge status: Home / Self Care | Payer: Medicare Other | Source: Ambulatory Visit | Attending: Family | Admitting: Family

## 2016-01-18 ENCOUNTER — Ambulatory Visit (INDEPENDENT_AMBULATORY_CARE_PROVIDER_SITE_OTHER): Payer: Medicare Other | Admitting: Family

## 2016-01-18 VITALS — BP 154/78 | HR 68 | Temp 97.8°F | Resp 16 | Ht 68.0 in | Wt 153.0 lb

## 2016-01-18 DIAGNOSIS — Z23 Encounter for immunization: Secondary | ICD-10-CM | POA: Diagnosis not present

## 2016-01-18 DIAGNOSIS — Z0000 Encounter for general adult medical examination without abnormal findings: Secondary | ICD-10-CM | POA: Insufficient documentation

## 2016-01-18 DIAGNOSIS — Z87891 Personal history of nicotine dependence: Secondary | ICD-10-CM | POA: Diagnosis not present

## 2016-01-18 DIAGNOSIS — E119 Type 2 diabetes mellitus without complications: Secondary | ICD-10-CM

## 2016-01-18 MED ORDER — GLUCOSE BLOOD VI STRP: ORAL_STRIP | Status: DC

## 2016-01-18 MED ORDER — ONETOUCH ULTRA MINI W/DEVICE KIT: PACK | Status: DC

## 2016-01-18 NOTE — Patient Instructions (Signed)
Thank you for choosing [facility].  Summary/Instructions:  Your prescription(s) have been submitted to your pharmacy or been printed and provided for you. Please take as directed and contact our office if you believe you are having problem(s) with the medication(s) or have any questions.  Please stop by the lab on the basement level of the building for your blood work. Your results will be released to [place] (or called to you) after review, usually within 72 hours after test completion. If any changes need to be made, you will be notified at that same time.  Health Maintenance  Topic Date Due  . FOOT EXAM  10/21/1937  . TETANUS/TDAP  10/21/1946  . ZOSTAVAX  10/22/1987  . HEMOGLOBIN A1C  08/01/2013  . PNA vac Low Risk Adult (2 of 2 - PCV13) 02/06/2015  . INFLUENZA VACCINE  06/28/2016  . OPHTHALMOLOGY EXAM  12/13/2016    Health Maintenance, Male A healthy lifestyle and preventative care can promote health and wellness.  Maintain regular health, dental, and eye exams.  Eat a healthy diet. Foods like vegetables, fruits, whole grains, low-fat dairy products, and lean protein foods contain the nutrients you need and are low in calories. Decrease your intake of foods high in solid fats, added sugars, and salt. Get information about a proper diet from your health care provider, if necessary.  Regular physical exercise is one of the most important things you can do for your health. Most adults should get at least 150 minutes of moderate-intensity exercise (any activity that increases your heart rate and causes you to sweat) each week. In addition, most adults need muscle-strengthening exercises on 2 or more days a week.   Maintain a healthy weight. The body mass index (BMI) is a screening tool to identify possible weight problems. It provides an estimate of body fat based on height and weight. Your health care provider can find your BMI and can help you achieve or maintain a healthy  weight. For males 20 years and older:  A BMI below 18.5 is considered underweight.  A BMI of 18.5 to 24.9 is normal.  A BMI of 25 to 29.9 is considered overweight.  A BMI of 30 and above is considered obese.  Maintain normal blood lipids and cholesterol by exercising and minimizing your intake of saturated fat. Eat a balanced diet with plenty of fruits and vegetables. Blood tests for lipids and cholesterol should begin at age 45 and be repeated every 5 years. If your lipid or cholesterol levels are high, you are over age 77, or you are at high risk for heart disease, you may need your cholesterol levels checked more frequently.Ongoing high lipid and cholesterol levels should be treated with medicines if diet and exercise are not working.  If you smoke, find out from your health care provider how to quit. If you do not use tobacco, do not start.  Lung cancer screening is recommended for adults aged 1-80 years who are at high risk for developing lung cancer because of a history of smoking. A yearly low-dose CT scan of the lungs is recommended for people who have at least a 30-pack-year history of smoking and are current smokers or have quit within the past 15 years. A pack year of smoking is smoking an average of 1 pack of cigarettes a day for 1 year (for example, a 30-pack-year history of smoking could mean smoking 1 pack a day for 30 years or 2 packs a day for 15 years). Yearly screening  should continue until the smoker has stopped smoking for at least 15 years. Yearly screening should be stopped for people who develop a health problem that would prevent them from having lung cancer treatment.  If you choose to drink alcohol, do not have more than 2 drinks per day. One drink is considered to be 12 oz (360 mL) of beer, 5 oz (150 mL) of wine, or 1.5 oz (45 mL) of liquor.  Avoid the use of street drugs. Do not share needles with anyone. Ask for help if you need support or instructions about stopping  the use of drugs.  High blood pressure causes heart disease and increases the risk of stroke. High blood pressure is more likely to develop in:  People who have blood pressure in the end of the normal range (100-139/85-89 mm Hg).  People who are overweight or obese.  People who are African American.  If you are 10-43 years of age, have your blood pressure checked every 3-5 years. If you are 78 years of age or older, have your blood pressure checked every year. You should have your blood pressure measured twice--once when you are at a hospital or clinic, and once when you are not at a hospital or clinic. Record the average of the two measurements. To check your blood pressure when you are not at a hospital or clinic, you can use:  An automated blood pressure machine at a pharmacy.  A home blood pressure monitor.  If you are 65-16 years old, ask your health care provider if you should take aspirin to prevent heart disease.  Diabetes screening involves taking a blood sample to check your fasting blood sugar level. This should be done once every 3 years after age 42 if you are at a normal weight and without risk factors for diabetes. Testing should be considered at a younger age or be carried out more frequently if you are overweight and have at least 1 risk factor for diabetes.  Colorectal cancer can be detected and often prevented. Most routine colorectal cancer screening begins at the age of 53 and continues through age 28. However, your health care provider may recommend screening at an earlier age if you have risk factors for colon cancer. On a yearly basis, your health care provider may provide home test kits to check for hidden blood in the stool. A small camera at the end of a tube may be used to directly examine the colon (sigmoidoscopy or colonoscopy) to detect the earliest forms of colorectal cancer. Talk to your health care provider about this at age 67 when routine screening begins. A  direct exam of the colon should be repeated every 5-10 years through age 71, unless early forms of precancerous polyps or small growths are found.  People who are at an increased risk for hepatitis B should be screened for this virus. You are considered at high risk for hepatitis B if:  You were born in a country where hepatitis B occurs often. Talk with your health care provider about which countries are considered high risk.  Your parents were born in a high-risk country and you have not received a shot to protect against hepatitis B (hepatitis B vaccine).  You have HIV or AIDS.  You use needles to inject street drugs.  You live with, or have sex with, someone who has hepatitis B.  You are a man who has sex with other men (MSM).  You get hemodialysis treatment.  You take certain  medicines for conditions like cancer, organ transplantation, and autoimmune conditions.  Hepatitis C blood testing is recommended for all people born from [birth year] through 1963 and any individual with known risk factors for hepatitis C.  Healthy men should no longer receive prostate-specific antigen (PSA) blood tests as part of routine cancer screening. Talk to your health care provider about prostate cancer screening.  Testicular cancer screening is not recommended for adolescents or adult males who have no symptoms. Screening includes self-exam, a health care provider exam, and other screening tests. Consult with your health care provider about any symptoms you have or any concerns you have about testicular cancer.  Practice safe sex. Use condoms and avoid high-risk sexual practices to reduce the spread of sexually transmitted infections (STIs).  You should be screened for STIs, including gonorrhea and chlamydia if:  You are sexually active and are younger than 24 years.  You are older than 24 years, and your health care provider tells you that you are at risk for this type of infection.  Your sexual  activity has changed since you were last screened, and you are at an increased risk for chlamydia or gonorrhea. Ask your health care provider if you are at risk.  If you are at risk of being infected with HIV, it is recommended that you take a prescription medicine daily to prevent HIV infection. This is called pre-exposure prophylaxis (PrEP). You are considered at risk if:  You are a man who has sex with other men (MSM).  You are a heterosexual man who is sexually active with multiple partners.  You take drugs by injection.  You are sexually active with a partner who has HIV.  Talk with your health care provider about whether you are at high risk of being infected with HIV. If you choose to begin PrEP, you should first be tested for HIV. You should then be tested every 3 months for as long as you are taking PrEP.  Use sunscreen. Apply sunscreen liberally and repeatedly throughout the day. You should seek shade when your shadow is shorter than you. Protect yourself by wearing long sleeves, pants, a wide-brimmed hat, and sunglasses year round whenever you are outdoors.  Tell your health care provider of new moles or changes in moles, especially if there is a change in shape or color. Also, tell your health care provider if a mole is larger than the size of a pencil eraser.  A one-time screening for abdominal aortic aneurysm (AAA) and surgical repair of large AAAs by ultrasound is recommended for men aged 21-75 years who are current or former smokers.  Stay current with your vaccines (immunizations).   This information is not intended to replace advice given to you by your health care provider. Make sure you discuss any questions you have with your health care provider.   Document Released: 05/12/2008 Document Revised: 12/05/2014 Document Reviewed: 04/11/2011 Elsevier Interactive Patient Education 2016 [place] Directive Advance directives are the legal documents that allow  you to make choices about your health care and medical treatment if you cannot speak for yourself. Advance directives are a way for you to communicate your wishes to family, friends, and health care providers. The specified people can then convey your decisions about end-of-life care to avoid confusion if you should become unable to communicate. Ideally, the process of discussing and writing advance directives should happen over time rather than making decisions all at once. Advance directives can be modified as your situation  changes, and you can change your mind at any time, even after you have signed the advance directives. Each state has its own laws regarding advance directives. You may want to check with your health care provider, attorney, or state representative about the law in your state. Below are some examples of advance directives. LIVING WILL A living will is a set of instructions documenting your wishes about medical care when you cannot care for yourself. It is used if you become:  Terminally ill.  Incapacitated.  Unable to communicate.  Unable to make decisions. Items to consider in your living will include:  The use or non-use of life-sustaining equipment, such as dialysis machines and breathing machines (ventilators).  A do not resuscitate (DNR) order, which is the instruction not to use cardiopulmonary resuscitation (CPR) if breathing or heartbeat stops.  Tube feeding.  Withholding of food and fluids.  Comfort (palliative) care when the goal becomes comfort rather than a cure.  Organ and tissue donation. A living will does not give instructions about distribution of your money and property if you should pass away. It is advisable to seek the expert advice of a lawyer in drawing up a will regarding your possessions. Decisions about taxes, beneficiaries, and asset distribution will be legally binding. This process can relieve your family and friends of any burdens  surrounding disputes or questions that may come up about the allocation of your assets. DO NOT RESUSCITATE (DNR) A do not resuscitate (DNR) order is a request to not have CPR in the event that your heart stops beating or you stop breathing. Unless given other instructions, a health care provider will try to help any patient whose heart has stopped or who has stopped breathing.  HEALTH CARE PROXY AND DURABLE POWER OF ATTORNEY FOR HEALTH CARE A health care proxy is a person (agent) appointed to make medical decisions for you if you cannot. Generally, people choose someone they know well and trust to represent their preferences when they can no longer do so. You should be sure to ask this person for agreement to act as your agent. An agent may have to exercise judgment in the event of a medical decision for which your wishes are not known. The durable power of attorney for health care is the legal document that names your health care proxy. Once written, it should be:  Signed.  Notarized.  Dated.  Copied.  Witnessed.  Incorporated into your medical record. You may also want to appoint someone to manage your financial affairs if you cannot. This is called a durable power of attorney for finances. It is a separate legal document from the durable power of attorney for health care. You may choose the same person or someone different from your health care proxy to act as your agent in financial matters.   This information is not intended to replace advice given to you by your health care provider. Make sure you discuss any questions you have with your health care provider.   Document Released: 02/21/2008 Document Revised: 11/19/2013 Document Reviewed: 04/03/2013 Elsevier Interactive Patient Education [employer].

## 2016-01-18 NOTE — Progress Notes (Signed)
Pre visit review using our clinic review tool, if applicable. No additional management support is needed unless otherwise documented below in the visit note. 

## 2016-01-18 NOTE — Progress Notes (Signed)
Subjective:    Patient ID: Gerald Jacobs, male    DOB: 1925-04-12, 82 y.o.   MRN: [id number]  Chief Complaint  Patient presents with  . Establish Care    CPE, not fasting, diabetic supplies    HPI:  Gerald Jacobs is a 82 y.o. male who presents today for an annual wellness visit.   1) Health Maintenance -   Diet - Averages about 3 meals per day with snacks consisting of fruits, vegetables, chicken, beef and pork. Limited fast/processed food. Caffeine intake of about 2-3 cups per day.  Exercise - Working out at [employer]; plays golf weekly.   2) Preventative Exams / Immunizations:  Dental -- Up to date  Vision -- Up to date   Health Maintenance  Topic Date Due  . FOOT EXAM  10/21/1937  . TETANUS/TDAP  10/21/1946  . ZOSTAVAX  10/22/1987  . HEMOGLOBIN A1C  08/01/2013  . PNA vac Low Risk Adult (2 of 2 - PCV13) 02/06/2015  . INFLUENZA VACCINE  06/28/2016  . OPHTHALMOLOGY EXAM  12/13/2016     Immunization History  Administered Date(s) Administered  . Influenza,inj,Quad PF,36+ Mos 09/30/2015  . Influenza-Unspecified 08/28/2014  . Pneumococcal Conjugate-13 01/18/2016  . Pneumococcal Polysaccharide-23 02/05/2014     RISK FACTORS  Tobacco History  Smoking status  . Former Smoker -- 1.00 packs/day for 45 years  . Types: Cigarettes  . Quit date: 09/07/1979  Smokeless tobacco  . Never Used     Cardiac risk factors: advanced age (older than 3 for men, 60 for women), diabetes mellitus, dyslipidemia, hypertension and male gender.  Depression Screen  Q1: Over the past two weeks, have you felt down, depressed or hopeless? No  Q2: Over the past two weeks, have you felt little interest or pleasure in doing things? No  Have you lost interest or pleasure in daily life? No  Do you often feel hopeless? No  Do you cry easily over simple problems? No  Activities of Daily Living In your present state of health, do you have any difficulty performing the following  activities?:  Driving? No Managing money?  No Feeding yourself? No Getting from bed to chair? No Climbing a flight of stairs? No Preparing food and eating?: No Bathing or showering? No Getting dressed: No Getting to the toilet? No Using the toilet: No Moving around from place to place: No In the past year have you fallen or had a near fall?:No   Home Safety Has smoke detector and wears seat belts. No firearms. No excess sun exposure. Are there smokers in your home (other than you)?  No Do you feel safe at home?  Yes  Hearing Difficulties: No Do you often ask people to speak up or repeat themselves? No Do you experience ringing or noises in your ears? No  Do you have difficulty understanding soft or whispered voices? No    Cognitive Testing  Alert? Yes   Normal Appearance? Yes  Oriented to person? Yes  Place? Yes   Time? Yes  Recall of three objects?  Yes  Can perform simple calculations? Yes  Displays appropriate judgment? Yes  Can read the correct time from a watch face? Yes  Do you feel that you have a problem with memory? No  Do you often misplace items? No   Advanced Directives have been discussed with the patient? Yes - needs updating.   Current Physicians/Providers and Suppliers  1. Terri Piedra, [place]  [facility] 2. Darlin Coco, MD -  Cardiology  Indicate any recent Medical Services you may have received from other than [facility] providers in the past year (date may be approximate).  All answers were reviewed with the patient and necessary referrals were made:  Mauricio Po, FNP   01/18/2016   Allergies  Allergen Reactions  . Colchicine Other (See Comments)    GI problems  . Flexeril [Cyclobenzaprine Hcl] Other (See Comments)    GI problems   . Percocet [Oxycodone-Acetaminophen] Nausea And Vomiting  . Zebeta Other (See Comments)    Gi problems     Outpatient Prescriptions Prior to Visit  Medication Sig Dispense Refill  . allopurinol  (ZYLOPRIM) 100 MG tablet Take 100 mg by mouth daily.    Marland Kitchen aspirin 81 MG tablet Take 81 mg by mouth daily.      Marland Kitchen atorvastatin (LIPITOR) 10 MG tablet TAKE 1 TABLET BY MOUTH EVERY DAY 90 tablet 0  . Biotin 1000 MCG tablet Take 1,000 mcg by mouth daily.    . diphenhydramine-acetaminophen (TYLENOL PM) 25-500 MG TABS Take 1 tablet by mouth at bedtime as needed (sleep).    . losartan-hydrochlorothiazide (HYZAAR) 100-12.5 MG tablet TAKE 1/2 TABLET BY MOUTH DAILY 45 tablet 1  . metoprolol succinate (TOPROL-XL) 50 MG 24 hr tablet Take 50 mg by mouth. Take 1 tablet by mouth in the morning and 1/2 tablet by mouth in the evening. Take with or immediately following a meal.    . Omega-3 Fatty Acids (FISH OIL PO) Take 1 capsule by mouth daily.    . sitaGLIPtin (JANUVIA) 50 MG tablet Take 50 mg by mouth daily.     No facility-administered medications prior to visit.     Past Medical History  Diagnosis Date  . Essential hypertension   . Mild renal insufficiency   . Diabetes mellitus     Adult onset  . Chronic anemia     With normal iron studies and normal serum protein electrophoresis  . Coronary artery disease     Mild  . History of gout   . Nocturia     x2  . Osteoarthritis   . GERD (gastroesophageal reflux disease)     occ tums  . Cancer [facility])     prostate     Past Surgical History  Procedure Laterality Date  . Knee surgery Right 1979  . Prostate surgery  1993    Prostate Cancer  . Melanoma surgery  1970's    on scalp  . Ercp N/A 10/15/2013    Procedure: ENDOSCOPIC RETROGRADE CHOLANGIOPANCREATOGRAPHY (ERCP);  Surgeon: Jeryl Columbia, MD;  Location: [facility];  Service: Endoscopy;  Laterality: N/A;  . Sphincterotomy  10/15/2013    Procedure: SPHINCTEROTOMY;  Surgeon: Jeryl Columbia, MD;  Location: [facility];  Service: Endoscopy;;  . Cholecystectomy  02/04/2014    DR Zella Richer  . Cholecystectomy N/A 02/04/2014    Procedure: LAPAROSCOPIC CHOLECYSTECTOMY with intraoperative  cholangiogram;  Surgeon: Odis Hollingshead, MD;  Location: [facility];  Service: General;  Laterality: N/A;     Family History  Problem Relation Age of Onset  . Stroke Father   . Stroke Mother   . Stroke Sister   . Stroke Brother   . Stroke Brother   . Stroke Brother      Social History   Social History  . Marital Status: Married    Spouse Name: N/A  . Number of Children: 2  . Years of Education: 14   Occupational History  . Not on file.  Social History Main Topics  . Smoking status: Former Smoker -- 1.00 packs/day for 45 years    Types: Cigarettes    Quit date: 09/07/1979  . Smokeless tobacco: Never Used  . Alcohol Use: Yes     Comment: beer rarely  . Drug Use: No  . Sexual Activity: Not on file   Other Topics Concern  . Not on file   Social History Narrative     Review of Systems  Constitutional: Denies fever, chills, fatigue, or significant weight gain/loss. HENT: Head: Denies headache or neck pain Ears: Denies changes in hearing, ringing in ears, earache, drainage Nose: Denies discharge, stuffiness, itching, nosebleed, sinus pain Throat: Denies sore throat, hoarseness, dry mouth, sores, thrush Eyes: Denies loss/changes in vision, pain, redness, blurry/double vision, flashing lights Cardiovascular: Denies chest pain/discomfort, tightness, palpitations, shortness of breath with activity, difficulty lying down, swelling, sudden awakening with shortness of breath Respiratory: Denies shortness of breath, cough, sputum production, wheezing Gastrointestinal: Denies dysphasia, heartburn, change in appetite, nausea, change in bowel habits, rectal bleeding, constipation, diarrhea, yellow skin or eyes Genitourinary: Denies frequency, urgency, burning/pain, blood in urine, incontinence, change in urinary strength. Musculoskeletal: Denies muscle/joint pain, stiffness, back pain, redness or swelling of joints, trauma Skin: Denies rashes, lumps, itching, dryness, color  changes, or hair/nail changes Neurological: Denies dizziness, fainting, seizures, weakness, numbness, tingling, tremor Psychiatric - Denies nervousness, stress, depression or memory loss Endocrine: Denies heat or cold intolerance, sweating, frequent urination, excessive thirst, changes in appetite Hematologic: Denies ease of bruising or bleeding    Objective:     BP 154/78 mmHg  Pulse 68  Temp(Src) 97.8 F (36.6 C) (Oral)  Resp 16  Ht _0  (1.727 m)  Wt 153 lb (69.4 kg)  BMI 23.27 kg/m2  SpO2 98% Nursing note and vital signs reviewed.  Physical Exam  Constitutional: He is oriented to person, place, and time. He appears well-developed and well-nourished.  HENT:  Head: Normocephalic.  Right Ear: Hearing, tympanic membrane, external ear and ear canal normal.  Left Ear: Hearing, tympanic membrane, external ear and ear canal normal.  Nose: Nose normal.  Mouth/Throat: Uvula is midline, oropharynx is clear and moist and mucous membranes are normal.  Eyes: Conjunctivae and EOM are normal. Pupils are equal, round, and reactive to light.  Neck: Neck supple. No JVD present. No tracheal deviation present. No thyromegaly present.  Cardiovascular: Normal rate, regular rhythm, normal heart sounds and intact distal pulses.   Pulmonary/Chest: Effort normal and breath sounds normal.  Abdominal: Soft. Bowel sounds are normal. He exhibits no distension and no mass. There is no tenderness. There is no rebound and no guarding.  Musculoskeletal: Normal range of motion. He exhibits no edema or tenderness.  Lymphadenopathy:    He has no cervical adenopathy.  Neurological: He is alert and oriented to person, place, and time. He has normal reflexes. No cranial nerve deficit. He exhibits normal muscle tone. Coordination normal.  Skin: Skin is warm and dry.  Psychiatric: He has a normal mood and affect. His behavior is normal. Judgment and thought content normal.       Assessment & Plan:   During the  course of the visit the patient was educated and counseled about appropriate screening and preventive services including:    Pneumococcal vaccine   Influenza vaccine  Td vaccine  Diabetes screening  Glaucoma screening  Diet review for nutrition referral? Yes ____  Not Indicated _X___   Patient Instructions (the written plan) was given to the patient.  Medicare Attestation I have personally reviewed: The patient's medical and social history Their use of alcohol, tobacco or illicit drugs Their current medications and supplements The patient's functional ability including ADLs,fall risks, home safety risks, cognitive, and hearing and visual impairment Diet and physical activities Evidence for depression or mood disorders  The patient's weight, height, BMI,  have been recorded in the chart.  I have made referrals, counseling, and provided education to the patient based on review of the above and I have provided the patient with a written personalized care plan for preventive services.     Mauricio Po, FNP   01/18/2016    Problem List Items Addressed This Visit      Endocrine   Type 2 diabetes mellitus ([place])   Relevant Medications   Blood Glucose Monitoring Suppl (ONE TOUCH ULTRA MINI) w/Device KIT   glucose blood test strip   Other Relevant Orders   Hemoglobin A1c     Other   Medicare annual wellness visit, subsequent - Primary    Reviewed and updated patient's medical, surgical, family and social history. Medications and allergies were also reviewed. Basic screenings for depression, activities of daily living, hearing, cognition and safety were performed. Provider list was updated and health plan was provided to the patient.       Routine general medical examination at a health care facility    1) Anticipatory Guidance: Discussed importance of wearing a seatbelt while driving and not texting while driving; changing batteries in smoke detector at least once annually;  wearing suntan lotion when outside; eating a balanced and moderate diet; getting physical activity at least 30 minutes per day.  2) Immunizations / Screenings / Labs:  Prevnar updated today. Declined tetanus and Zostavax. All other immunizations are up to date per recommendations. Diabetic foot exam completed. Obtain A1c for diabetes screen.Obtain chest x-ray for lung cancer screen with history of smoking.  All other screenings are up to date per recommendations. Obtain CBC, BMET, Lipid profile and TSH.   Overall exam with risk factors for cardiovascular disease including type 2 diabetes, dyslipidemia, and chronic renal insufficiency. Chronic conditions are currently managed with medications. Encouraged to continue physical activity with goal of 30 minutes of moderate level intensity daily. Continue nutrition intake that is balanced, varied, and moderate and focuses on nutrient dense foods and is low in saturated fats and processed/sugary foods. Continue other healthy lifestyle behaviors and choices. Follow-up prevention exam in 1 year. Follow-up office visit for chronic conditions.       Relevant Orders   CBC   Comprehensive metabolic panel   Lipid panel   TSH   Hemoglobin A1c   DG Chest 2 View (Completed)    Other Visit Diagnoses    Need for vaccination with 13-polyvalent pneumococcal conjugate vaccine        Relevant Orders    Pneumococcal conjugate vaccine 13-valent IM (Completed)

## 2016-01-18 NOTE — Assessment & Plan Note (Signed)
Reviewed and updated patient's medical, surgical, family and social history. Medications and allergies were also reviewed. Basic screenings for depression, activities of daily living, hearing, cognition and safety were performed. Provider list was updated and health plan was provided to the patient.  

## 2016-01-18 NOTE — Assessment & Plan Note (Addendum)
1) Anticipatory Guidance: Discussed importance of wearing a seatbelt while driving and not texting while driving; changing batteries in smoke detector at least once annually; wearing suntan lotion when outside; eating a balanced and moderate diet; getting physical activity at least 30 minutes per day.  2) Immunizations / Screenings / Labs:  Prevnar updated today. Declined tetanus and Zostavax. All other immunizations are up to date per recommendations. Diabetic foot exam completed. Obtain A1c for diabetes screen.Obtain chest x-ray for lung cancer screen with history of smoking.  All other screenings are up to date per recommendations. Obtain CBC, BMET, Lipid profile and TSH.   Overall exam with risk factors for cardiovascular disease including type 2 diabetes, dyslipidemia, and chronic renal insufficiency. Chronic conditions are currently managed with medications. Encouraged to continue physical activity with goal of 30 minutes of moderate level intensity daily. Continue nutrition intake that is balanced, varied, and moderate and focuses on nutrient dense foods and is low in saturated fats and processed/sugary foods. Continue other healthy lifestyle behaviors and choices. Follow-up prevention exam in 1 year. Follow-up office visit for chronic conditions.

## 2016-01-19 ENCOUNTER — Telehealth: Payer: Self-pay | Admitting: Family

## 2016-01-19 NOTE — Telephone Encounter (Signed)
Please inform patient that his chest x-ray shows changes consistent with chronic obstructive pulmonary disease with no active disease process. Therefore no treatment is needed at this time. If he is having shortness of breath we can send him for pulmonary function testing.

## 2016-01-20 ENCOUNTER — Telehealth: Payer: Self-pay | Admitting: *Deleted

## 2016-01-20 MED ORDER — ONETOUCH ULTRASOFT LANCETS MISC: 1.0000 | Freq: Three times a day (TID) | Status: DC

## 2016-01-20 MED ORDER — LANCETS MICRO THIN 33G MISC
Status: DC
Start: 2016-01-20 — End: 2016-01-20

## 2016-01-20 NOTE — Telephone Encounter (Signed)
Pt left msg on triage stating Greg sent BS monitor & strips to pharmacy. He did not send lancets. Requesting rx to be sent. Fax script for lancets...Gerald Jacobs

## 2016-01-20 NOTE — Telephone Encounter (Signed)
Pt is aware of results. No SOB at this time.

## 2016-01-20 NOTE — Telephone Encounter (Signed)
Pt was in office today with his wife, informed of x-ray results.

## 2016-01-22 ENCOUNTER — Other Ambulatory Visit (INDEPENDENT_AMBULATORY_CARE_PROVIDER_SITE_OTHER): Payer: Medicare Other

## 2016-01-22 DIAGNOSIS — Z0000 Encounter for general adult medical examination without abnormal findings: Secondary | ICD-10-CM

## 2016-01-22 DIAGNOSIS — E119 Type 2 diabetes mellitus without complications: Secondary | ICD-10-CM

## 2016-01-22 LAB — CBC
HCT: 35.1 % — ABNORMAL LOW (ref 39.0–52.0)
Hemoglobin: 11.7 g/dL — ABNORMAL LOW (ref 13.0–17.0)
MCHC: 33.4 g/dL (ref 30.0–36.0)
MCV: 96.6 fl (ref 78.0–100.0)
PLATELETS: 157 10*3/uL (ref 150.0–400.0)
RBC: 3.64 Mil/uL — AB (ref 4.22–5.81)
RDW: 17.2 % — ABNORMAL HIGH (ref 11.5–15.5)
WBC: 6.2 10*3/uL (ref 4.0–10.5)

## 2016-01-22 LAB — COMPREHENSIVE METABOLIC PANEL
ALT: 14 U/L (ref 0–53)
AST: 15 U/L (ref 0–37)
Albumin: 4 g/dL (ref 3.5–5.2)
Alkaline Phosphatase: 49 U/L (ref 39–117)
BUN: 36 mg/dL — AB (ref 6–23)
CALCIUM: 8.8 mg/dL (ref 8.4–10.5)
CHLORIDE: 104 meq/L (ref 96–112)
CO2: 25 meq/L (ref 19–32)
CREATININE: 2.33 mg/dL — AB (ref 0.40–1.50)
GFR: 28.22 mL/min — ABNORMAL LOW (ref >60.00)
Glucose, Bld: 146 mg/dL — ABNORMAL HIGH (ref 70–99)
POTASSIUM: 5 meq/L (ref 3.5–5.1)
SODIUM: 137 meq/L (ref 135–145)
Total Bilirubin: 0.7 mg/dL (ref 0.2–1.2)
Total Protein: 7.5 g/dL (ref 6.0–8.3)

## 2016-01-22 LAB — LIPID PANEL
CHOL/HDL RATIO: 2
Cholesterol: 119 mg/dL (ref 0–200)
HDL: 52.6 mg/dL (ref >39.00)
LDL Cholesterol: 36 mg/dL (ref 0–99)
NONHDL: 66.77
TRIGLYCERIDES: 155 mg/dL — AB (ref 0.0–149.0)
VLDL: 31 mg/dL (ref 0.0–40.0)

## 2016-01-22 LAB — TSH: TSH: 2.54 u[IU]/mL (ref 0.35–4.50)

## 2016-01-22 LAB — HEMOGLOBIN A1C: HEMOGLOBIN A1C: 6.3 % (ref 4.6–6.5)

## 2016-01-24 ENCOUNTER — Telehealth: Payer: Self-pay | Admitting: Family

## 2016-01-24 NOTE — Telephone Encounter (Signed)
Please inform patient that his blood work shows that his thyroid function, electrolytes, liver function and cholesterol are all within the normal ranges. His kidney function as anticipated is decreased and continue to follow up with nephrology. This is also most likely the cause of a mild anemia noted with his white/red blood cells. Lastly his hemoglobin A1c is 6.3 indicating prediabetes. No medication is needed at this time and continue to monitor with physical activity and nutrition. Follow-up in 6 months or sooner if needed.

## 2016-01-26 NOTE — Telephone Encounter (Signed)
LVM for pt to call back.

## 2016-01-27 NOTE — Telephone Encounter (Signed)
Pt aware of results. Sending lab work to kidney doctor.

## 2016-01-29 ENCOUNTER — Other Ambulatory Visit (HOSPITAL_COMMUNITY): Payer: Self-pay | Admitting: *Deleted

## 2016-02-01 ENCOUNTER — Encounter (HOSPITAL_COMMUNITY)
Admission: RE | Admit: 2016-02-01 | Discharge: 2016-02-01 | Disposition: A | Discharge status: Home / Self Care | Payer: Medicare Other | Source: Ambulatory Visit | Attending: Nephrology | Admitting: Nephrology

## 2016-02-01 DIAGNOSIS — D631 Anemia in chronic kidney disease: Secondary | ICD-10-CM | POA: Insufficient documentation

## 2016-02-01 DIAGNOSIS — N183 Chronic kidney disease, stage 3 (moderate): Secondary | ICD-10-CM | POA: Insufficient documentation

## 2016-02-01 LAB — POCT HEMOGLOBIN-HEMACUE: Hemoglobin: 10.8 g/dL — ABNORMAL LOW (ref 13.0–17.0)

## 2016-02-01 MED ORDER — EPOETIN ALFA 10000 UNIT/ML IJ SOLN
30000.0000 [IU] | INTRAMUSCULAR | Status: DC
  Administered 2016-02-01: 30000 [IU] via SUBCUTANEOUS

## 2016-02-01 MED ORDER — EPOETIN ALFA 20000 UNIT/ML IJ SOLN
INTRAMUSCULAR | Status: AC
  Filled 2016-02-01: qty 1

## 2016-02-01 MED ORDER — EPOETIN ALFA 10000 UNIT/ML IJ SOLN
INTRAMUSCULAR | Status: AC
  Filled 2016-02-01: qty 1

## 2016-02-02 MED FILL — Epoetin Alfa Inj 10000 Unit/ML: INTRAMUSCULAR | Qty: 1 | Status: AC

## 2016-02-02 MED FILL — Epoetin Alfa Inj 20000 Unit/ML: INTRAMUSCULAR | Qty: 1 | Status: AC

## 2016-02-16 ENCOUNTER — Ambulatory Visit (INDEPENDENT_AMBULATORY_CARE_PROVIDER_SITE_OTHER): Payer: Medicare Other | Admitting: Nurse Practitioner

## 2016-02-16 ENCOUNTER — Other Ambulatory Visit: Payer: Medicare Other

## 2016-02-16 ENCOUNTER — Encounter: Payer: Self-pay | Admitting: Nurse Practitioner

## 2016-02-16 VITALS — BP 136/54 | HR 66 | Ht 67.0 in | Wt 154.0 lb

## 2016-02-16 DIAGNOSIS — N189 Chronic kidney disease, unspecified: Secondary | ICD-10-CM | POA: Diagnosis not present

## 2016-02-16 DIAGNOSIS — R002 Palpitations: Secondary | ICD-10-CM | POA: Diagnosis not present

## 2016-02-16 DIAGNOSIS — I119 Hypertensive heart disease without heart failure: Secondary | ICD-10-CM

## 2016-02-16 DIAGNOSIS — N183 Chronic kidney disease, stage 3 unspecified: Secondary | ICD-10-CM

## 2016-02-16 DIAGNOSIS — E7800 Pure hypercholesterolemia, unspecified: Secondary | ICD-10-CM

## 2016-02-16 NOTE — Progress Notes (Signed)
CARDIOLOGY OFFICE NOTE  Date:  02/16/2016    Gerald Jacobs Date of Birth: 03-Feb-1925 Medical Record #[id number]  PCP:  Mauricio Po, FNP  Cardiologist:  Former patient of Dr. Sherryl Barters    Chief Complaint  Patient presents with  . Hypertension  . Palpitations  . Hyperlipidemia    4 month check - former patient of Dr. Sherryl Barters    History of Present Illness: Gerald Jacobs is a 82 y.o. male who presents today for a 4 month check. Former patient of Dr. Sherryl Barters.   He has a history of HTN, palpitations, dyslipidemia, diabetes, and mild renal insufficiency. Also has a history of hyperuricemia. He has anemia secondary to chronic renal disease and receives ejections of Procrit about every 3 weeks depending on his hemoglobin level. Dr. Posey Pronto is his nephrologist.   He was last seen back in November and felt to be doing well. Had his Medicare well check last month by PCP.   Comes back today. Here alone. Wife was to be seen also today - but having shoulder issues due to a fall and has cancelled. He is doing ok. No chest pain. He is trying to stay active but notes he has slowed down some. Has stable DOE. No falls. No passing out. Occasional palpitations but nothing that really worries him or produces symptoms. He is pleased with how he is doing. He has a brother who is alive at 53.    Past Medical History  Diagnosis Date  . Essential hypertension   . Mild renal insufficiency   . Diabetes mellitus     Adult onset  . Chronic anemia     With normal iron studies and normal serum protein electrophoresis  . Coronary artery disease     Mild  . History of gout   . Nocturia     x2  . Osteoarthritis   . GERD (gastroesophageal reflux disease)     occ tums  . Cancer [facility])     prostate    Past Surgical History  Procedure Laterality Date  . Knee surgery Right 1979  . Prostate surgery  1993    Prostate Cancer  . Melanoma surgery  1970's    on scalp  . Ercp N/A  10/15/2013    Procedure: ENDOSCOPIC RETROGRADE CHOLANGIOPANCREATOGRAPHY (ERCP);  Surgeon: Jeryl Columbia, MD;  Location: [facility];  Service: Endoscopy;  Laterality: N/A;  . Sphincterotomy  10/15/2013    Procedure: SPHINCTEROTOMY;  Surgeon: Jeryl Columbia, MD;  Location: [facility];  Service: Endoscopy;;  . Cholecystectomy  02/04/2014    DR Zella Richer  . Cholecystectomy N/A 02/04/2014    Procedure: LAPAROSCOPIC CHOLECYSTECTOMY with intraoperative cholangiogram;  Surgeon: Odis Hollingshead, MD;  Location: [facility];  Service: General;  Laterality: N/A;     Medications: Current Outpatient Prescriptions  Medication Sig Dispense Refill  . allopurinol (ZYLOPRIM) 100 MG tablet Take 100 mg by mouth daily.    Marland Kitchen aspirin 81 MG tablet Take 81 mg by mouth daily.      Marland Kitchen atorvastatin (LIPITOR) 10 MG tablet TAKE 1 TABLET BY MOUTH EVERY DAY 90 tablet 0  . Biotin 1000 MCG tablet Take 1,000 mcg by mouth daily.    . Blood Glucose Monitoring Suppl (ONE TOUCH ULTRA MINI) w/Device KIT Use the device to check your blood sugar 1-4 times daily as instructed. 1 each 0  . diphenhydramine-acetaminophen (TYLENOL PM) 25-500 MG TABS Take 1 tablet by mouth at bedtime as needed (sleep).    Marland Kitchen  glucose blood test strip Use one strip per test. Check blood sugars 1-4 times per day as instructed. 100 each 12  . Lancets (ONETOUCH ULTRASOFT) lancets 1 each by Other route 3 (three) times daily. Use to check blood sugars up to three times a day Dx E11.9 100 each 3  . losartan-hydrochlorothiazide (HYZAAR) 100-12.5 MG tablet TAKE 1/2 TABLET BY MOUTH DAILY 45 tablet 1  . metoprolol succinate (TOPROL-XL) 50 MG 24 hr tablet Take 50 mg by mouth. Take 1 tablet by mouth in the morning and 1/2 tablet by mouth in the evening. Take with or immediately following a meal.    . Omega-3 Fatty Acids (FISH OIL PO) Take 1 capsule by mouth daily.    . sitaGLIPtin (JANUVIA) 50 MG tablet Take 50 mg by mouth daily.     No current facility-administered  medications for this visit.    Allergies: Allergies  Allergen Reactions  . Colchicine Other (See Comments)    GI problems  . Flexeril [Cyclobenzaprine Hcl] Other (See Comments)    GI problems   . Percocet [Oxycodone-Acetaminophen] Nausea And Vomiting  . Zebeta Other (See Comments)    Gi problems    Social History: The patient  reports that he quit smoking about 36 years ago. His smoking use included Cigarettes. He has a 45 pack-year smoking history. He has never used smokeless tobacco. He reports that he drinks alcohol. He reports that he does not use illicit drugs.   Family History: The patient's family history includes Stroke in his brother, brother, brother, father, mother, and sister.   Review of Systems: Please see the history of present illness.   Otherwise, the review of systems is positive for none.   All other systems are reviewed and negative.   Physical Exam: VS:  BP 136/54 mmHg  Pulse 66  Ht '5\' 7"'  (1.702 m)  Wt 154 lb (69.854 kg)  BMI 24.11 kg/m2 .  BMI Body mass index is 24.11 kg/(m^2).  Wt Readings from Last 3 Encounters:  02/16/16 154 lb (69.854 kg)  01/18/16 153 lb (69.4 kg)  10/05/15 152 lb (68.947 kg)    General: Pleasant. Looks younger than his stated age. Well developed, well nourished and in no acute distress.  HEENT: Normal. Neck: Supple, no JVD, carotid bruits, or masses noted.  Cardiac: Regular rate and rhythm. No murmurs, rubs, or gallops. No edema.  Respiratory:  Lungs are clear to auscultation bilaterally with normal work of breathing.  GI: Soft and nontender.  MS: No deformity or atrophy. Gait and ROM intact. Skin: Warm and dry. Color is normal.  Neuro:  Strength and sensation are intact and no gross focal deficits noted.  Psych: Alert, appropriate and with normal affect.   LABORATORY DATA:  EKG:  EKG is not ordered today.  Lab Results  Component Value Date   WBC 6.2 01/22/2016   HGB 10.8* 02/01/2016   HCT 35.1* 01/22/2016   PLT  157.0 01/22/2016   GLUCOSE 146* 01/22/2016   CHOL 119 01/22/2016   TRIG 155.0* 01/22/2016   HDL 52.60 01/22/2016   LDLCALC 36 01/22/2016   ALT 14 01/22/2016   AST 15 01/22/2016   NA 137 01/22/2016   K 5.0 01/22/2016   CL 104 01/22/2016   CREATININE 2.33* 01/22/2016   BUN 36* 01/22/2016   CO2 25 01/22/2016   TSH 2.54 01/22/2016   INR 1.12 01/28/2014   HGBA1C 6.3 01/22/2016    BNP (last 3 results) No results for input(s): BNP in the  last 8760 hours.  ProBNP (last 3 results) No results for input(s): PROBNP in the last 8760 hours.   Other Studies Reviewed Today:   Assessment/Plan: 1. 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   2. History of palpitations secondary to bigeminy PVCs - stable on current regimen.  3. CKD - followed by nephrology  4. DM - followed by PCP - last AIC was 6.3  5. History of gout - on allopurinol.  6. Hypercholesterolemia - recent labs reviewed. He remains on statin therapy. No changes made.   Current medicines are reviewed with the patient today.  The patient does not have concerns regarding medicines other than what has been noted above.  The following changes have been made:  See above.  Labs/ tests ordered today include:   No orders of the defined types were placed in this encounter.     Disposition:   FU with D.r Nahser in 6 months.   Patient is agreeable to this plan and will call if any problems develop in the interim.   Signed: Burtis Junes, RN, ANP-C 02/16/2016 9:46 AM  [facility] [address] [place], [place]  [address] Phone: [phone] Fax: [phone]

## 2016-02-16 NOTE — Patient Instructions (Addendum)
We will be checking the following labs today - NONE   Medication Instructions:    Continue with your current medicines.     Testing/Procedures To Be Arranged:  N/A  Follow-Up:   See Dr. Acie Fredrickson in 6 months    Other Special Instructions:   Stay active    If you need a refill on your cardiac medications before your next appointment, please call your pharmacy.   Call the [place] office at [phone] if you have any questions, problems or concerns.

## 2016-02-22 ENCOUNTER — Encounter (HOSPITAL_COMMUNITY)
Admission: RE | Admit: 2016-02-22 | Discharge: 2016-02-22 | Disposition: A | Discharge status: Home / Self Care | Payer: Medicare Other | Source: Ambulatory Visit | Attending: Nephrology | Admitting: Nephrology

## 2016-02-22 DIAGNOSIS — D631 Anemia in chronic kidney disease: Secondary | ICD-10-CM | POA: Diagnosis not present

## 2016-02-22 LAB — IRON AND TIBC
Iron: 128 ug/dL (ref 45–182)
Saturation Ratios: 43 % — ABNORMAL HIGH (ref 17.9–39.5)
TIBC: 297 ug/dL (ref 250–450)
UIBC: 169 ug/dL

## 2016-02-22 LAB — RENAL FUNCTION PANEL
ALBUMIN: 3.5 g/dL (ref 3.5–5.0)
Anion gap: 9 (ref 5–15)
BUN: 45 mg/dL — AB (ref 6–20)
CALCIUM: 8.9 mg/dL (ref 8.9–10.3)
CO2: 22 mmol/L (ref 22–32)
CREATININE: 2.48 mg/dL — AB (ref 0.61–1.24)
Chloride: 104 mmol/L (ref 101–111)
GFR, EST AFRICAN AMERICAN: 25 mL/min — AB (ref >60)
GFR, EST NON AFRICAN AMERICAN: 22 mL/min — AB (ref >60)
Glucose, Bld: 208 mg/dL — ABNORMAL HIGH (ref 65–99)
PHOSPHORUS: 3.6 mg/dL (ref 2.5–4.6)
Potassium: 4.6 mmol/L (ref 3.5–5.1)
SODIUM: 135 mmol/L (ref 135–145)

## 2016-02-22 LAB — FERRITIN: FERRITIN: 195 ng/mL (ref 24–336)

## 2016-02-22 LAB — POCT HEMOGLOBIN-HEMACUE: HEMOGLOBIN: 10.8 g/dL — AB (ref 13.0–17.0)

## 2016-02-22 MED ORDER — EPOETIN ALFA 10000 UNIT/ML IJ SOLN
INTRAMUSCULAR | Status: AC
  Filled 2016-02-22: qty 1

## 2016-02-22 MED ORDER — EPOETIN ALFA 10000 UNIT/ML IJ SOLN
30000.0000 [IU] | INTRAMUSCULAR | Status: DC
  Administered 2016-02-22: 10000 [IU] via SUBCUTANEOUS

## 2016-02-22 MED ORDER — EPOETIN ALFA 20000 UNIT/ML IJ SOLN
INTRAMUSCULAR | Status: AC
  Administered 2016-02-22: 20000 [IU]
  Filled 2016-02-22: qty 1

## 2016-02-25 ENCOUNTER — Other Ambulatory Visit: Payer: Self-pay | Admitting: Cardiology

## 2016-03-14 ENCOUNTER — Encounter (HOSPITAL_COMMUNITY)
Admission: RE | Admit: 2016-03-14 | Discharge: 2016-03-14 | Disposition: A | Discharge status: Home / Self Care | Payer: Medicare Other | Source: Ambulatory Visit | Attending: Nephrology | Admitting: Nephrology

## 2016-03-14 DIAGNOSIS — D631 Anemia in chronic kidney disease: Secondary | ICD-10-CM | POA: Diagnosis present

## 2016-03-14 DIAGNOSIS — N183 Chronic kidney disease, stage 3 (moderate): Secondary | ICD-10-CM | POA: Insufficient documentation

## 2016-03-14 LAB — POCT HEMOGLOBIN-HEMACUE: HEMOGLOBIN: 10.9 g/dL — AB (ref 13.0–17.0)

## 2016-03-14 MED ORDER — EPOETIN ALFA 10000 UNIT/ML IJ SOLN
INTRAMUSCULAR | Status: AC
  Administered 2016-03-14: 10000 [IU] via SUBCUTANEOUS
  Filled 2016-03-14: qty 1

## 2016-03-14 MED ORDER — EPOETIN ALFA 10000 UNIT/ML IJ SOLN: 30000.0000 [IU] | INTRAMUSCULAR | Status: DC

## 2016-03-14 MED ORDER — EPOETIN ALFA 20000 UNIT/ML IJ SOLN
INTRAMUSCULAR | Status: AC
  Administered 2016-03-14: 20000 [IU] via SUBCUTANEOUS
  Filled 2016-03-14: qty 1

## 2016-03-21 ENCOUNTER — Other Ambulatory Visit: Payer: Self-pay

## 2016-03-21 MED ORDER — ATORVASTATIN CALCIUM 10 MG PO TABS: 10.0000 mg | ORAL_TABLET | Freq: Every day | ORAL | Status: DC

## 2016-04-04 ENCOUNTER — Encounter (HOSPITAL_COMMUNITY)
Admission: RE | Admit: 2016-04-04 | Discharge: 2016-04-04 | Disposition: A | Discharge status: Home / Self Care | Payer: Medicare Other | Source: Ambulatory Visit | Attending: Nephrology | Admitting: Nephrology

## 2016-04-04 DIAGNOSIS — N183 Chronic kidney disease, stage 3 (moderate): Secondary | ICD-10-CM | POA: Insufficient documentation

## 2016-04-04 DIAGNOSIS — D631 Anemia in chronic kidney disease: Secondary | ICD-10-CM | POA: Insufficient documentation

## 2016-04-04 LAB — IRON AND TIBC
IRON: 110 ug/dL (ref 45–182)
Saturation Ratios: 34 % (ref 17.9–39.5)
TIBC: 328 ug/dL (ref 250–450)
UIBC: 218 ug/dL

## 2016-04-04 LAB — RENAL FUNCTION PANEL
Albumin: 3.5 g/dL (ref 3.5–5.0)
Anion gap: 10 (ref 5–15)
BUN: 43 mg/dL — ABNORMAL HIGH (ref 6–20)
CHLORIDE: 108 mmol/L (ref 101–111)
CO2: 22 mmol/L (ref 22–32)
CREATININE: 2.31 mg/dL — AB (ref 0.61–1.24)
Calcium: 8.9 mg/dL (ref 8.9–10.3)
GFR, EST AFRICAN AMERICAN: 27 mL/min — AB (ref >60)
GFR, EST NON AFRICAN AMERICAN: 24 mL/min — AB (ref >60)
Glucose, Bld: 223 mg/dL — ABNORMAL HIGH (ref 65–99)
Phosphorus: 3.5 mg/dL (ref 2.5–4.6)
Potassium: 4.4 mmol/L (ref 3.5–5.1)
Sodium: 140 mmol/L (ref 135–145)

## 2016-04-04 LAB — POCT HEMOGLOBIN-HEMACUE: Hemoglobin: 10.6 g/dL — ABNORMAL LOW (ref 13.0–17.0)

## 2016-04-04 LAB — FERRITIN: FERRITIN: 124 ng/mL (ref 24–336)

## 2016-04-04 MED ORDER — EPOETIN ALFA 10000 UNIT/ML IJ SOLN
30000.0000 [IU] | INTRAMUSCULAR | Status: DC
  Administered 2016-04-04: 10000 [IU] via SUBCUTANEOUS

## 2016-04-04 MED ORDER — EPOETIN ALFA 10000 UNIT/ML IJ SOLN
INTRAMUSCULAR | Status: AC
  Filled 2016-04-04: qty 1

## 2016-04-04 MED ORDER — EPOETIN ALFA 20000 UNIT/ML IJ SOLN
INTRAMUSCULAR | Status: AC
  Administered 2016-04-04: 20000 [IU] via SUBCUTANEOUS
  Filled 2016-04-04: qty 1

## 2016-04-26 ENCOUNTER — Encounter (HOSPITAL_COMMUNITY)
Admission: RE | Admit: 2016-04-26 | Discharge: 2016-04-26 | Disposition: A | Discharge status: Home / Self Care | Payer: Medicare Other | Source: Ambulatory Visit | Attending: Nephrology | Admitting: Nephrology

## 2016-04-26 DIAGNOSIS — D631 Anemia in chronic kidney disease: Secondary | ICD-10-CM | POA: Diagnosis not present

## 2016-04-26 LAB — POCT HEMOGLOBIN-HEMACUE: HEMOGLOBIN: 10.8 g/dL — AB (ref 13.0–17.0)

## 2016-04-26 MED ORDER — EPOETIN ALFA 20000 UNIT/ML IJ SOLN
INTRAMUSCULAR | Status: AC
  Administered 2016-04-26: 20000 [IU]
  Filled 2016-04-26: qty 1

## 2016-04-26 MED ORDER — EPOETIN ALFA 10000 UNIT/ML IJ SOLN
INTRAMUSCULAR | Status: AC
  Filled 2016-04-26: qty 1

## 2016-04-26 MED ORDER — EPOETIN ALFA 10000 UNIT/ML IJ SOLN
30000.0000 [IU] | INTRAMUSCULAR | Status: DC
  Administered 2016-04-26: 10000 [IU] via SUBCUTANEOUS

## 2016-05-16 ENCOUNTER — Encounter (HOSPITAL_COMMUNITY)
Admission: RE | Admit: 2016-05-16 | Discharge: 2016-05-16 | Disposition: A | Discharge status: Home / Self Care | Payer: Medicare Other | Source: Ambulatory Visit | Attending: Nephrology | Admitting: Nephrology

## 2016-05-16 DIAGNOSIS — D631 Anemia in chronic kidney disease: Secondary | ICD-10-CM | POA: Diagnosis present

## 2016-05-16 DIAGNOSIS — N183 Chronic kidney disease, stage 3 (moderate): Secondary | ICD-10-CM | POA: Diagnosis not present

## 2016-05-16 LAB — IRON AND TIBC
Iron: 111 ug/dL (ref 45–182)
SATURATION RATIOS: 33 % (ref 17.9–39.5)
TIBC: 337 ug/dL (ref 250–450)
UIBC: 226 ug/dL

## 2016-05-16 LAB — RENAL FUNCTION PANEL
ALBUMIN: 3.4 g/dL — AB (ref 3.5–5.0)
ANION GAP: 6 (ref 5–15)
BUN: 52 mg/dL — ABNORMAL HIGH (ref 6–20)
CALCIUM: 8.9 mg/dL (ref 8.9–10.3)
CO2: 20 mmol/L — ABNORMAL LOW (ref 22–32)
Chloride: 108 mmol/L (ref 101–111)
Creatinine, Ser: 2.29 mg/dL — ABNORMAL HIGH (ref 0.61–1.24)
GFR calc Af Amer: 28 mL/min — ABNORMAL LOW (ref >60)
GFR, EST NON AFRICAN AMERICAN: 24 mL/min — AB (ref >60)
GLUCOSE: 197 mg/dL — AB (ref 65–99)
PHOSPHORUS: 3 mg/dL (ref 2.5–4.6)
Potassium: 4.8 mmol/L (ref 3.5–5.1)
SODIUM: 134 mmol/L — AB (ref 135–145)

## 2016-05-16 LAB — FERRITIN: Ferritin: 186 ng/mL (ref 24–336)

## 2016-05-16 MED ORDER — EPOETIN ALFA 20000 UNIT/ML IJ SOLN
INTRAMUSCULAR | Status: AC
  Administered 2016-05-16: 20000 [IU] via SUBCUTANEOUS
  Filled 2016-05-16: qty 1

## 2016-05-16 MED ORDER — EPOETIN ALFA 10000 UNIT/ML IJ SOLN
INTRAMUSCULAR | Status: AC
  Administered 2016-05-16: 10000 [IU] via SUBCUTANEOUS
  Filled 2016-05-16: qty 1

## 2016-05-16 MED ORDER — EPOETIN ALFA 10000 UNIT/ML IJ SOLN: 30000.0000 [IU] | INTRAMUSCULAR | Status: DC

## 2016-05-17 LAB — POCT HEMOGLOBIN-HEMACUE: HEMOGLOBIN: 10.6 g/dL — AB (ref 13.0–17.0)

## 2016-06-06 ENCOUNTER — Encounter (HOSPITAL_COMMUNITY)
Admission: RE | Admit: 2016-06-06 | Discharge: 2016-06-06 | Disposition: A | Discharge status: Home / Self Care | Payer: Medicare Other | Source: Ambulatory Visit | Attending: Nephrology | Admitting: Nephrology

## 2016-06-06 DIAGNOSIS — N183 Chronic kidney disease, stage 3 (moderate): Secondary | ICD-10-CM | POA: Diagnosis not present

## 2016-06-06 DIAGNOSIS — D631 Anemia in chronic kidney disease: Secondary | ICD-10-CM | POA: Diagnosis present

## 2016-06-06 LAB — POCT HEMOGLOBIN-HEMACUE: HEMOGLOBIN: 9.8 g/dL — AB (ref 13.0–17.0)

## 2016-06-06 MED ORDER — EPOETIN ALFA 20000 UNIT/ML IJ SOLN
INTRAMUSCULAR | Status: AC
  Administered 2016-06-06: 20000 [IU] via SUBCUTANEOUS
  Filled 2016-06-06: qty 1

## 2016-06-06 MED ORDER — EPOETIN ALFA 10000 UNIT/ML IJ SOLN
30000.0000 [IU] | INTRAMUSCULAR | Status: DC
  Administered 2016-06-06: 10000 [IU] via SUBCUTANEOUS

## 2016-06-06 MED ORDER — EPOETIN ALFA 10000 UNIT/ML IJ SOLN
INTRAMUSCULAR | Status: AC
  Filled 2016-06-06: qty 1

## 2016-06-23 ENCOUNTER — Other Ambulatory Visit: Payer: Self-pay | Admitting: Nephrology

## 2016-06-23 DIAGNOSIS — D649 Anemia, unspecified: Secondary | ICD-10-CM | POA: Insufficient documentation

## 2016-06-23 DIAGNOSIS — D638 Anemia in other chronic diseases classified elsewhere: Secondary | ICD-10-CM

## 2016-06-23 HISTORY — DX: Anemia, unspecified: D64.9

## 2016-06-27 ENCOUNTER — Encounter (HOSPITAL_COMMUNITY)
Admission: RE | Admit: 2016-06-27 | Discharge: 2016-06-27 | Disposition: A | Discharge status: Home / Self Care | Payer: Medicare Other | Source: Ambulatory Visit | Attending: Nephrology | Admitting: Nephrology

## 2016-06-27 DIAGNOSIS — D638 Anemia in other chronic diseases classified elsewhere: Secondary | ICD-10-CM

## 2016-06-27 DIAGNOSIS — D631 Anemia in chronic kidney disease: Secondary | ICD-10-CM | POA: Diagnosis not present

## 2016-06-27 LAB — RENAL FUNCTION PANEL
ALBUMIN: 3.5 g/dL (ref 3.5–5.0)
ANION GAP: 6 (ref 5–15)
BUN: 38 mg/dL — ABNORMAL HIGH (ref 6–20)
CHLORIDE: 106 mmol/L (ref 101–111)
CO2: 23 mmol/L (ref 22–32)
Calcium: 8.9 mg/dL (ref 8.9–10.3)
Creatinine, Ser: 2.23 mg/dL — ABNORMAL HIGH (ref 0.61–1.24)
GFR, EST AFRICAN AMERICAN: 29 mL/min — AB (ref >60)
GFR, EST NON AFRICAN AMERICAN: 25 mL/min — AB (ref >60)
Glucose, Bld: 245 mg/dL — ABNORMAL HIGH (ref 65–99)
PHOSPHORUS: 3.3 mg/dL (ref 2.5–4.6)
POTASSIUM: 4.9 mmol/L (ref 3.5–5.1)
Sodium: 135 mmol/L (ref 135–145)

## 2016-06-27 LAB — IRON AND TIBC
IRON: 94 ug/dL (ref 45–182)
Saturation Ratios: 30 % (ref 17.9–39.5)
TIBC: 312 ug/dL (ref 250–450)
UIBC: 218 ug/dL

## 2016-06-27 LAB — POCT HEMOGLOBIN-HEMACUE: HEMOGLOBIN: 10.4 g/dL — AB (ref 13.0–17.0)

## 2016-06-27 LAB — FERRITIN: Ferritin: 117 ng/mL (ref 24–336)

## 2016-06-27 MED ORDER — EPOETIN ALFA 20000 UNIT/ML IJ SOLN
INTRAMUSCULAR | Status: AC
  Administered 2016-06-27: 20000 [IU] via SUBCUTANEOUS
  Filled 2016-06-27: qty 1

## 2016-06-27 MED ORDER — EPOETIN ALFA 10000 UNIT/ML IJ SOLN
30000.0000 [IU] | INTRAMUSCULAR | Status: DC
  Administered 2016-06-27: 10000 [IU] via SUBCUTANEOUS

## 2016-06-27 MED ORDER — EPOETIN ALFA 10000 UNIT/ML IJ SOLN
INTRAMUSCULAR | Status: AC
  Filled 2016-06-27: qty 1

## 2016-07-18 ENCOUNTER — Encounter (HOSPITAL_COMMUNITY)
Admission: RE | Admit: 2016-07-18 | Discharge: 2016-07-18 | Disposition: A | Discharge status: Home / Self Care | Payer: Medicare Other | Source: Ambulatory Visit | Attending: Nephrology | Admitting: Nephrology

## 2016-07-18 DIAGNOSIS — D631 Anemia in chronic kidney disease: Secondary | ICD-10-CM | POA: Diagnosis not present

## 2016-07-18 DIAGNOSIS — N183 Chronic kidney disease, stage 3 (moderate): Secondary | ICD-10-CM | POA: Diagnosis not present

## 2016-07-18 DIAGNOSIS — D638 Anemia in other chronic diseases classified elsewhere: Secondary | ICD-10-CM

## 2016-07-18 LAB — POCT HEMOGLOBIN-HEMACUE: HEMOGLOBIN: 11.1 g/dL — AB (ref 13.0–17.0)

## 2016-07-18 MED ORDER — EPOETIN ALFA 10000 UNIT/ML IJ SOLN
INTRAMUSCULAR | Status: AC
  Administered 2016-07-18: 10000 [IU] via SUBCUTANEOUS
  Filled 2016-07-18: qty 1

## 2016-07-18 MED ORDER — EPOETIN ALFA 10000 UNIT/ML IJ SOLN: 30000.0000 [IU] | INTRAMUSCULAR | Status: DC

## 2016-07-18 MED ORDER — EPOETIN ALFA 20000 UNIT/ML IJ SOLN
INTRAMUSCULAR | Status: AC
  Administered 2016-07-18: 20000 [IU] via SUBCUTANEOUS
  Filled 2016-07-18: qty 1

## 2016-07-28 ENCOUNTER — Ambulatory Visit (INDEPENDENT_AMBULATORY_CARE_PROVIDER_SITE_OTHER): Payer: Medicare Other | Admitting: Family

## 2016-07-28 ENCOUNTER — Encounter: Payer: Self-pay | Admitting: Family

## 2016-07-28 VITALS — BP 142/80 | HR 60 | Temp 97.9°F | Resp 16 | Ht 67.0 in | Wt 148.8 lb

## 2016-07-28 DIAGNOSIS — G720 Drug-induced myopathy: Secondary | ICD-10-CM | POA: Insufficient documentation

## 2016-07-28 DIAGNOSIS — T466X5A Adverse effect of antihyperlipidemic and antiarteriosclerotic drugs, initial encounter: Secondary | ICD-10-CM

## 2016-07-28 DIAGNOSIS — M25512 Pain in left shoulder: Secondary | ICD-10-CM | POA: Diagnosis not present

## 2016-07-28 DIAGNOSIS — M25511 Pain in right shoulder: Secondary | ICD-10-CM | POA: Diagnosis not present

## 2016-07-28 DIAGNOSIS — M5431 Sciatica, right side: Secondary | ICD-10-CM | POA: Diagnosis not present

## 2016-07-28 HISTORY — DX: Drug-induced myopathy: G72.0

## 2016-07-28 HISTORY — DX: Pain in right shoulder: M25.511

## 2016-07-28 HISTORY — DX: Adverse effect of antihyperlipidemic and antiarteriosclerotic drugs, initial encounter: T46.6X5A

## 2016-07-28 HISTORY — DX: Sciatica, right side: M54.31

## 2016-07-28 NOTE — Assessment & Plan Note (Signed)
Myalgias appear consistent with statin myopathy given improvement of symptoms since discontinuation of atorvastatin. Recheck cholesterol in 2 months. Consider alternatives if necessary. Given stated diabetes it is recommended for a lipid-lowering education to prevent coronary artery disease.

## 2016-07-28 NOTE — Assessment & Plan Note (Signed)
Bilateral shoulder pain with no significant findings upon physical exam. Appear consistent with rotator cuff tendinitis/bursitis. Unlikely tear given normal exam with good strength and full range of motion. Treat conservatively with ice, Tylenol, and home exercise therapy. If symptoms worsen or do not improve patient will follow up for additional imaging or therapy.

## 2016-07-28 NOTE — Patient Instructions (Addendum)
Thank you for choosing [facility].  SUMMARY AND INSTRUCTIONS:  Ice your shoulder x 20 minutes every 2 hours as needed and after activity and at night.   Home exercises daily.   Medication:  Tylenol as needed for discomfort. No more than 3000 mg in 1 day.   Follow up:  If your symptoms worsen or fail to improve, please contact our office for further instruction, or in case of emergency go directly to the emergency room at the closest medical facility.    Sciatica With Rehab The sciatic nerve runs from the back down the leg and is responsible for sensation and control of the muscles in the back (posterior) side of the thigh, lower leg, and foot. Sciatica is a condition that is characterized by inflammation of this nerve.  SYMPTOMS   Signs of nerve damage, including numbness and/or weakness along the posterior side of the lower extremity.  Pain in the back of the thigh that may also travel down the leg.  Pain that worsens when sitting for long periods of time.  Occasionally, pain in the back or buttock. CAUSES  Inflammation of the sciatic nerve is the cause of sciatica. The inflammation is due to something irritating the nerve. Common sources of irritation include:  Sitting for long periods of time.  Direct trauma to the nerve.  Arthritis of the spine.  Herniated or ruptured disk.  Slipping of the vertebrae (spondylolisthesis).  Pressure from soft tissues, such as muscles or ligament-like tissue (fascia). RISK INCREASES WITH:  Sports that place pressure or stress on the spine (football or weightlifting).  Poor strength and flexibility.  Failure to warm up properly before activity.  Family history of low back pain or disk disorders.  Previous back injury or surgery.  Poor body mechanics, especially when lifting, or poor posture. PREVENTION   Warm up and stretch properly before activity.  Maintain physical fitness:  Strength, flexibility, and  endurance.  Cardiovascular fitness.  Learn and use proper technique, especially with posture and lifting. When possible, have coach correct improper technique.  Avoid activities that place stress on the spine. PROGNOSIS If treated properly, then sciatica usually resolves within 6 weeks. However, occasionally surgery is necessary.  RELATED COMPLICATIONS   Permanent nerve damage, including pain, numbness, tingle, or weakness.  Chronic back pain.  Risks of surgery: infection, bleeding, nerve damage, or damage to surrounding tissues. TREATMENT Treatment initially involves resting from any activities that aggravate your symptoms. The use of ice and medication may help reduce pain and inflammation. The use of strengthening and stretching exercises may help reduce pain with activity. These exercises may be performed at home or with referral to a therapist. A therapist may recommend further treatments, such as transcutaneous electronic nerve stimulation (TENS) or ultrasound. Your caregiver may recommend corticosteroid injections to help reduce inflammation of the sciatic nerve. If symptoms persist despite non-surgical (conservative) treatment, then surgery may be recommended. MEDICATION  If pain medication is necessary, then nonsteroidal anti-inflammatory medications, such as aspirin and ibuprofen, or other minor pain relievers, such as acetaminophen, are often recommended.  Do not take pain medication for 7 days before surgery.  Prescription pain relievers may be given if deemed necessary by your caregiver. Use only as directed and only as much as you need.  Ointments applied to the skin may be helpful.  Corticosteroid injections may be given by your caregiver. These injections should be reserved for the most serious cases, because they may only be given a certain number of  times. HEAT AND COLD  Cold treatment (icing) relieves pain and reduces inflammation. Cold treatment should be applied  for 10 to 15 minutes every 2 to 3 hours for inflammation and pain and immediately after any activity that aggravates your symptoms. Use ice packs or massage the area with a piece of ice (ice massage).  Heat treatment may be used prior to performing the stretching and strengthening activities prescribed by your caregiver, physical therapist, or athletic trainer. Use a heat pack or soak the injury in warm water. SEEK MEDICAL CARE IF:  Treatment seems to offer no benefit, or the condition worsens.  Any medications produce adverse side effects. EXERCISES  RANGE OF MOTION (ROM) AND STRETCHING EXERCISES - Sciatica Most people with sciatic will find that their symptoms worsen with either excessive bending forward (flexion) or arching at the low back (extension). The exercises which will help resolve your symptoms will focus on the opposite motion. Your physician, physical therapist or athletic trainer will help you determine which exercises will be most helpful to resolve your low back pain. Do not complete any exercises without first consulting with your clinician. Discontinue any exercises which worsen your symptoms until you speak to your clinician. If you have pain, numbness or tingling which travels down into your buttocks, leg or foot, the goal of the therapy is for these symptoms to move closer to your back and eventually resolve. Occasionally, these leg symptoms will get better, but your low back pain may worsen; this is typically an indication of progress in your rehabilitation. Be certain to be very alert to any changes in your symptoms and the activities in which you participated in the 24 hours prior to the change. Sharing this information with your clinician will allow him/her to most efficiently treat your condition. These exercises may help you when beginning to rehabilitate your injury. Your symptoms may resolve with or without further involvement from your physician, physical therapist or  athletic trainer. While completing these exercises, remember:   Restoring tissue flexibility helps normal motion to return to the joints. This allows healthier, less painful movement and activity.  An effective stretch should be held for at least 30 seconds.  A stretch should never be painful. You should only feel a gentle lengthening or release in the stretched tissue. FLEXION RANGE OF MOTION AND STRETCHING EXERCISES: STRETCH - Flexion, Single Knee to Chest   Lie on a firm bed or floor with both legs extended in front of you.  Keeping one leg in contact with the floor, bring your opposite knee to your chest. Hold your leg in place by either grabbing behind your thigh or at your knee.  Pull until you feel a gentle stretch in your low back. Hold __________ seconds.  Slowly release your grasp and repeat the exercise with the opposite side. Repeat __________ times. Complete this exercise __________ times per day.  STRETCH - Flexion, Double Knee to Chest  Lie on a firm bed or floor with both legs extended in front of you.  Keeping one leg in contact with the floor, bring your opposite knee to your chest.  Tense your stomach muscles to support your back and then lift your other knee to your chest. Hold your legs in place by either grabbing behind your thighs or at your knees.  Pull both knees toward your chest until you feel a gentle stretch in your low back. Hold __________ seconds.  Tense your stomach muscles and slowly return one leg at a time  to the floor. Repeat __________ times. Complete this exercise __________ times per day.  STRETCH - Low Trunk Rotation   Lie on a firm bed or floor. Keeping your legs in front of you, bend your knees so they are both pointed toward the ceiling and your feet are flat on the floor.  Extend your arms out to the side. This will stabilize your upper body by keeping your shoulders in contact with the floor.  Gently and slowly drop both knees together  to one side until you feel a gentle stretch in your low back. Hold for __________ seconds.  Tense your stomach muscles to support your low back as you bring your knees back to the starting position. Repeat the exercise to the other side. Repeat __________ times. Complete this exercise __________ times per day  EXTENSION RANGE OF MOTION AND FLEXIBILITY EXERCISES: STRETCH - Extension, Prone on Elbows  Lie on your stomach on the floor, a bed will be too soft. Place your palms about shoulder width apart and at the height of your head.  Place your elbows under your shoulders. If this is too painful, stack pillows under your chest.  Allow your body to relax so that your hips drop lower and make contact more completely with the floor.  Hold this position for __________ seconds.  Slowly return to lying flat on the floor. Repeat __________ times. Complete this exercise __________ times per day.  RANGE OF MOTION - Extension, Prone Press Ups  Lie on your stomach on the floor, a bed will be too soft. Place your palms about shoulder width apart and at the height of your head.  Keeping your back as relaxed as possible, slowly straighten your elbows while keeping your hips on the floor. You may adjust the placement of your hands to maximize your comfort. As you gain motion, your hands will come more underneath your shoulders.  Hold this position __________ seconds.  Slowly return to lying flat on the floor. Repeat __________ times. Complete this exercise __________ times per day.  STRENGTHENING EXERCISES - Sciatica  These exercises may help you when beginning to rehabilitate your injury. These exercises should be done near your "sweet spot." This is the neutral, low-back arch, somewhere between fully rounded and fully arched, that is your least painful position. When performed in this safe range of motion, these exercises can be used for people who have either a flexion or extension based injury. These  exercises may resolve your symptoms with or without further involvement from your physician, physical therapist or athletic trainer. While completing these exercises, remember:   Muscles can gain both the endurance and the strength needed for everyday activities through controlled exercises.  Complete these exercises as instructed by your physician, physical therapist or athletic trainer. Progress with the resistance and repetition exercises only as your caregiver advises.  You may experience muscle soreness or fatigue, but the pain or discomfort you are trying to eliminate should never worsen during these exercises. If this pain does worsen, stop and make certain you are following the directions exactly. If the pain is still present after adjustments, discontinue the exercise until you can discuss the trouble with your clinician. STRENGTHENING - Deep Abdominals, Pelvic Tilt   Lie on a firm bed or floor. Keeping your legs in front of you, bend your knees so they are both pointed toward the ceiling and your feet are flat on the floor.  Tense your lower abdominal muscles to press your low back into  the floor. This motion will rotate your pelvis so that your tail bone is scooping upwards rather than pointing at your feet or into the floor.  With a gentle tension and even breathing, hold this position for __________ seconds. Repeat __________ times. Complete this exercise __________ times per day.  STRENGTHENING - Abdominals, Crunches   Lie on a firm bed or floor. Keeping your legs in front of you, bend your knees so they are both pointed toward the ceiling and your feet are flat on the floor. Cross your arms over your chest.  Slightly tip your chin down without bending your neck.  Tense your abdominals and slowly lift your trunk high enough to just clear your shoulder blades. Lifting higher can put excessive stress on the low back and does not further strengthen your abdominal muscles.  Control  your return to the starting position. Repeat __________ times. Complete this exercise __________ times per day.  STRENGTHENING - Quadruped, Opposite UE/LE Lift  Assume a hands and knees position on a firm surface. Keep your hands under your shoulders and your knees under your hips. You may place padding under your knees for comfort.  Find your neutral spine and gently tense your abdominal muscles so that you can maintain this position. Your shoulders and hips should form a rectangle that is parallel with the floor and is not twisted.  Keeping your trunk steady, lift your right hand no higher than your shoulder and then your left leg no higher than your hip. Make sure you are not holding your breath. Hold this position __________ seconds.  Continuing to keep your abdominal muscles tense and your back steady, slowly return to your starting position. Repeat with the opposite arm and leg. Repeat __________ times. Complete this exercise __________ times per day.  STRENGTHENING - Abdominals and Quadriceps, Straight Leg Raise   Lie on a firm bed or floor with both legs extended in front of you.  Keeping one leg in contact with the floor, bend the other knee so that your foot can rest flat on the floor.  Find your neutral spine, and tense your abdominal muscles to maintain your spinal position throughout the exercise.  Slowly lift your straight leg off the floor about 6 inches for a count of 15, making sure to not hold your breath.  Still keeping your neutral spine, slowly lower your leg all the way to the floor. Repeat this exercise with each leg __________ times. Complete this exercise __________ times per day. POSTURE AND BODY MECHANICS CONSIDERATIONS - Sciatica Keeping correct posture when sitting, standing or completing your activities will reduce the stress put on different body tissues, allowing injured tissues a chance to heal and limiting painful experiences. The following are general  guidelines for improved posture. Your physician or physical therapist will provide you with any instructions specific to your needs. While reading these guidelines, remember:  The exercises prescribed by your provider will help you have the flexibility and strength to maintain correct postures.  The correct posture provides the optimal environment for your joints to work. All of your joints have less wear and tear when properly supported by a spine with good posture. This means you will experience a healthier, less painful body.  Correct posture must be practiced with all of your activities, especially prolonged sitting and standing. Correct posture is as important when doing repetitive low-stress activities (typing) as it is when doing a single heavy-load activity (lifting). RESTING POSITIONS Consider which positions are most painful  for you when choosing a resting position. If you have pain with flexion-based activities (sitting, bending, stooping, squatting), choose a position that allows you to rest in a less flexed posture. You would want to avoid curling into a fetal position on your side. If your pain worsens with extension-based activities (prolonged standing, working overhead), avoid resting in an extended position such as sleeping on your stomach. Most people will find more comfort when they rest with their spine in a more neutral position, neither too rounded nor too arched. Lying on a non-sagging bed on your side with a pillow between your knees, or on your back with a pillow under your knees will often provide some relief. Keep in mind, being in any one position for a prolonged period of time, no matter how correct your posture, can still lead to stiffness. PROPER SITTING POSTURE In order to minimize stress and discomfort on your spine, you must sit with correct posture Sitting with good posture should be effortless for a healthy body. Returning to good posture is a gradual process. Many  people can work toward this most comfortably by using various supports until they have the flexibility and strength to maintain this posture on their own. When sitting with proper posture, your ears will fall over your shoulders and your shoulders will fall over your hips. You should use the back of the chair to support your upper back. Your low back will be in a neutral position, just slightly arched. You may place a small pillow or folded towel at the base of your low back for support.  When working at a desk, create an environment that supports good, upright posture. Without extra support, muscles fatigue and lead to excessive strain on joints and other tissues. Keep these recommendations in mind: CHAIR:   A chair should be able to slide under your desk when your back makes contact with the back of the chair. This allows you to work closely.  The chair's height should allow your eyes to be level with the upper part of your monitor and your hands to be slightly lower than your elbows. BODY POSITION  Your feet should make contact with the floor. If this is not possible, use a foot rest.  Keep your ears over your shoulders. This will reduce stress on your neck and low back. INCORRECT SITTING POSTURES   If you are feeling tired and unable to assume a healthy sitting posture, do not slouch or slump. This puts excessive strain on your back tissues, causing more damage and pain. Healthier options include:  Using more support, like a lumbar pillow.  Switching tasks to something that requires you to be upright or walking.  Talking a brief walk.  Lying down to rest in a neutral-spine position. PROLONGED STANDING WHILE SLIGHTLY LEANING FORWARD  When completing a task that requires you to lean forward while standing in one place for a long time, place either foot up on a stationary 2-4 inch high object to help maintain the best posture. When both feet are on the ground, the low back tends to lose its  slight inward curve. If this curve flattens (or becomes too large), then the back and your other joints will experience too much stress, fatigue more quickly and can cause pain.  CORRECT STANDING POSTURES Proper standing posture should be assumed with all daily activities, even if they only take a few moments, like when brushing your teeth. As in sitting, your ears should fall over your shoulders  and your shoulders should fall over your hips. You should keep a slight tension in your abdominal muscles to brace your spine. Your tailbone should point down to the ground, not behind your body, resulting in an over-extended swayback posture.  INCORRECT STANDING POSTURES  Common incorrect standing postures include a forward head, locked knees and/or an excessive swayback. WALKING Walk with an upright posture. Your ears, shoulders and hips should all line-up. PROLONGED ACTIVITY IN A FLEXED POSITION When completing a task that requires you to bend forward at your waist or lean over a low surface, try to find a way to stabilize 3 of 4 of your limbs. You can place a hand or elbow on your thigh or rest a knee on the surface you are reaching across. This will provide you more stability so that your muscles do not fatigue as quickly. By keeping your knees relaxed, or slightly bent, you will also reduce stress across your low back. CORRECT LIFTING TECHNIQUES DO :   Assume a wide stance. This will provide you more stability and the opportunity to get as close as possible to the object which you are lifting.  Tense your abdominals to brace your spine; then bend at the knees and hips. Keeping your back locked in a neutral-spine position, lift using your leg muscles. Lift with your legs, keeping your back straight.  Test the weight of unknown objects before attempting to lift them.  Try to keep your elbows locked down at your sides in order get the best strength from your shoulders when carrying an object.  Always  ask for help when lifting heavy or awkward objects. INCORRECT LIFTING TECHNIQUES DO NOT:   Lock your knees when lifting, even if it is a small object.  Bend and twist. Pivot at your feet or move your feet when needing to change directions.  Assume that you cannot safely pick up a paperclip without proper posture.   This information is not intended to replace advice given to you by your health care provider. Make sure you discuss any questions you have with your health care provider.   Document Released: 11/14/2005 Document Revised: 03/31/2015 Document Reviewed: 02/26/2009 [employer] Interactive Patient Education 2016 [employer]   Impingement Syndrome, Rotator Cuff, Bursitis With Rehab Impingement syndrome is a condition that involves inflammation of the tendons of the rotator cuff and the subacromial bursa, that causes pain in the shoulder. The rotator cuff consists of four tendons and muscles that control much of the shoulder and upper arm function. The subacromial bursa is a fluid filled sac that helps reduce friction between the rotator cuff and one of the bones of the shoulder (acromion). Impingement syndrome is usually an overuse injury that causes swelling of the bursa (bursitis), swelling of the tendon (tendonitis), and/or a tear of the tendon (strain). Strains are classified into three categories. Grade 1 strains cause pain, but the tendon is not lengthened. Grade 2 strains include a lengthened ligament, due to the ligament being stretched or partially ruptured. With grade 2 strains there is still function, although the function may be decreased. Grade 3 strains include a complete tear of the tendon or muscle, and function is usually impaired. SYMPTOMS   Pain around the shoulder, often at the outer portion of the upper arm.  Pain that gets worse with shoulder function, especially when reaching overhead or lifting.  Sometimes, aching when not using the arm.  Pain that wakes you up  at night.  Sometimes, tenderness, swelling, warmth, or  redness over the affected area.  Loss of strength.  Limited motion of the shoulder, especially reaching behind the back (to the back pocket or to unhook bra) or across your body.  Crackling sound (crepitation) when moving the arm.  Biceps tendon pain and inflammation (in the front of the shoulder). Worse when bending the elbow or lifting. CAUSES  Impingement syndrome is often an overuse injury, in which chronic (repetitive) motions cause the tendons or bursa to become inflamed. A strain occurs when a force is paced on the tendon or muscle that is greater than it can withstand. Common mechanisms of injury include: Stress from sudden increase in duration, frequency, or intensity of training.  Direct hit (trauma) to the shoulder.  Aging, erosion of the tendon with normal use.  Bony bump on shoulder (acromial spur). RISK INCREASES WITH:  Contact sports (football, wrestling, boxing).  Throwing sports (baseball, tennis, volleyball).  Weightlifting and bodybuilding.  Heavy labor.  Previous injury to the rotator cuff, including impingement.  Poor shoulder strength and flexibility.  Failure to warm up properly before activity.  Inadequate protective equipment.  Old age.  Bony bump on shoulder (acromial spur). PREVENTION   Warm up and stretch properly before activity.  Allow for adequate recovery between workouts.  Maintain physical fitness:  Strength, flexibility, and endurance.  Cardiovascular fitness.  Learn and use proper exercise technique. PROGNOSIS  If treated properly, impingement syndrome usually goes away within 6 weeks. Sometimes surgery is required.  RELATED COMPLICATIONS   Longer healing time if not properly treated, or if not given enough time to heal.  Recurring symptoms, that result in a chronic condition.  Shoulder stiffness, frozen shoulder, or loss of motion.  Rotator cuff tendon  tear.  Recurring symptoms, especially if activity is resumed too soon, with overuse, with a direct blow, or when using poor technique. TREATMENT  Treatment first involves the use of ice and medicine, to reduce pain and inflammation. The use of strengthening and stretching exercises may help reduce pain with activity. These exercises may be performed at home or with a therapist. If non-surgical treatment is unsuccessful after more than 6 months, surgery may be advised. After surgery and rehabilitation, activity is usually possible in 3 months.  MEDICATION  If pain medicine is needed, nonsteroidal anti-inflammatory medicines (aspirin and ibuprofen), or other minor pain relievers (acetaminophen), are often advised.  Do not take pain medicine for 7 days before surgery.  Prescription pain relievers may be given, if your caregiver thinks they are needed. Use only as directed and only as much as you need.  Corticosteroid injections may be given by your caregiver. These injections should be reserved for the most serious cases, because they may only be given a certain number of times. HEAT AND COLD  Cold treatment (icing) should be applied for 10 to 15 minutes every 2 to 3 hours for inflammation and pain, and immediately after activity that aggravates your symptoms. Use ice packs or an ice massage.  Heat treatment may be used before performing stretching and strengthening activities prescribed by your caregiver, physical therapist, or athletic trainer. Use a heat pack or a warm water soak. SEEK MEDICAL CARE IF:   Symptoms get worse or do not improve in 4 to 6 weeks, despite treatment.  New, unexplained symptoms develop. (Drugs used in treatment may produce side effects.) EXERCISES  RANGE OF MOTION (ROM) AND STRETCHING EXERCISES - Impingement Syndrome (Rotator Cuff  Tendinitis, Bursitis) These exercises may help you when beginning to rehabilitate your  injury. Your symptoms may go away with or  without further involvement from your physician, physical therapist or athletic trainer. While completing these exercises, remember:   Restoring tissue flexibility helps normal motion to return to the joints. This allows healthier, less painful movement and activity.  An effective stretch should be held for at least 30 seconds.  A stretch should never be painful. You should only feel a gentle lengthening or release in the stretched tissue. STRETCH - Flexion, Standing  Stand with good posture. With an underhand grip on your right / left hand, and an overhand grip on the opposite hand, grasp a broomstick or cane so that your hands are a little more than shoulder width apart.  Keeping your right / left elbow straight and shoulder muscles relaxed, push the stick with your opposite hand, to raise your right / left arm in front of your body and then overhead. Raise your arm until you feel a stretch in your right / left shoulder, but before you have increased shoulder pain.  Try to avoid shrugging your right / left shoulder as your arm rises, by keeping your shoulder blade tucked down and toward your mid-back spine. Hold for __________ seconds.  Slowly return to the starting position. Repeat __________ times. Complete this exercise __________ times per day. STRETCH - Abduction, Supine  Lie on your back. With an underhand grip on your right / left hand and an overhand grip on the opposite hand, grasp a broomstick or cane so that your hands are a little more than shoulder width apart.  Keeping your right / left elbow straight and your shoulder muscles relaxed, push the stick with your opposite hand, to raise your right / left arm out to the side of your body and then overhead. Raise your arm until you feel a stretch in your right / left shoulder, but before you have increased shoulder pain.  Try to avoid shrugging your right / left shoulder as your arm rises, by keeping your shoulder blade tucked down  and toward your mid-back spine. Hold for __________ seconds.  Slowly return to the starting position. Repeat __________ times. Complete this exercise __________ times per day. ROM - Flexion, Active-Assisted  Lie on your back. You may bend your knees for comfort.  Grasp a broomstick or cane so your hands are about shoulder width apart. Your right / left hand should grip the end of the stick, so that your hand is positioned "thumbs-up," as if you were about to shake hands.  Using your healthy arm to lead, raise your right / left arm overhead, until you feel a gentle stretch in your shoulder. Hold for __________ seconds.  Use the stick to assist in returning your right / left arm to its starting position. Repeat __________ times. Complete this exercise __________ times per day.  ROM - Internal Rotation, Supine   Lie on your back on a firm surface. Place your right / left elbow about 60 degrees away from your side. Elevate your elbow with a folded towel, so that the elbow and shoulder are the same height.  Using a broomstick or cane and your strong arm, pull your right / left hand toward your body until you feel a gentle stretch, but no increase in your shoulder pain. Keep your shoulder and elbow in place throughout the exercise.  Hold for __________ seconds. Slowly return to the starting position. Repeat __________ times. Complete this exercise __________ times per day. STRETCH - Internal Rotation  Place  your right / left hand behind your back, palm up.  Throw a towel or belt over your opposite shoulder. Grasp the towel with your right / left hand.  While keeping an upright posture, gently pull up on the towel, until you feel a stretch in the front of your right / left shoulder.  Avoid shrugging your right / left shoulder as your arm rises, by keeping your shoulder blade tucked down and toward your mid-back spine.  Hold for __________ seconds. Release the stretch, by lowering your  healthy hand. Repeat __________ times. Complete this exercise __________ times per day. ROM - Internal Rotation   Using an underhand grip, grasp a stick behind your back with both hands.  While standing upright with good posture, slide the stick up your back until you feel a mild stretch in the front of your shoulder.  Hold for __________ seconds. Slowly return to your starting position. Repeat __________ times. Complete this exercise __________ times per day.  STRETCH - Posterior Shoulder Capsule   Stand or sit with good posture. Grasp your right / left elbow and draw it across your chest, keeping it at the same height as your shoulder.  Pull your elbow, so your upper arm comes in closer to your chest. Pull until you feel a gentle stretch in the back of your shoulder.  Hold for __________ seconds. Repeat __________ times. Complete this exercise __________ times per day. STRENGTHENING EXERCISES - Impingement Syndrome (Rotator Cuff Tendinitis, Bursitis) These exercises may help you when beginning to rehabilitate your injury. They may resolve your symptoms with or without further involvement from your physician, physical therapist or athletic trainer. While completing these exercises, remember:  Muscles can gain both the endurance and the strength needed for everyday activities through controlled exercises.  Complete these exercises as instructed by your physician, physical therapist or athletic trainer. Increase the resistance and repetitions only as guided.  You may experience muscle soreness or fatigue, but the pain or discomfort you are trying to eliminate should never worsen during these exercises. If this pain does get worse, stop and make sure you are following the directions exactly. If the pain is still present after adjustments, discontinue the exercise until you can discuss the trouble with your clinician.  During your recovery, avoid activity or exercises which involve actions  that place your injured hand or elbow above your head or behind your back or head. These positions stress the tissues which you are trying to heal. STRENGTH - Scapular Depression and Adduction   With good posture, sit on a firm chair. Support your arms in front of you, with pillows, arm rests, or on a table top. Have your elbows in line with the sides of your body.  Gently draw your shoulder blades down and toward your mid-back spine. Gradually increase the tension, without tensing the muscles along the top of your shoulders and the back of your neck.  Hold for __________ seconds. Slowly release the tension and relax your muscles completely before starting the next repetition.  After you have practiced this exercise, remove the arm support and complete the exercise in standing as well as sitting position. Repeat __________ times. Complete this exercise __________ times per day.  STRENGTH - Shoulder Abductors, Isometric  With good posture, stand or sit about 4-6 inches from a wall, with your right / left side facing the wall.  Bend your right / left elbow. Gently press your right / left elbow into the wall. Increase the pressure  gradually, until you are pressing as hard as you can, without shrugging your shoulder or increasing any shoulder discomfort.  Hold for __________ seconds.  Release the tension slowly. Relax your shoulder muscles completely before you begin the next repetition. Repeat __________ times. Complete this exercise __________ times per day.  STRENGTH - External Rotators, Isometric  Keep your right / left elbow at your side and bend it 90 degrees.  Step into a door frame so that the outside of your right / left wrist can press against the door frame without your upper arm leaving your side.  Gently press your right / left wrist into the door frame, as if you were trying to swing the back of your hand away from your stomach. Gradually increase the tension, until you are  pressing as hard as you can, without shrugging your shoulder or increasing any shoulder discomfort.  Hold for __________ seconds.  Release the tension slowly. Relax your shoulder muscles completely before you begin the next repetition. Repeat __________ times. Complete this exercise __________ times per day.  STRENGTH - Supraspinatus   Stand or sit with good posture. Grasp a __________ weight, or an exercise band or tubing, so that your hand is "thumbs-up," like you are shaking hands.  Slowly lift your right / left arm in a "V" away from your thigh, diagonally into the space between your side and straight ahead. Lift your hand to shoulder height or as far as you can, without increasing any shoulder pain. At first, many people do not lift their hands above shoulder height.  Avoid shrugging your right / left shoulder as your arm rises, by keeping your shoulder blade tucked down and toward your mid-back spine.  Hold for __________ seconds. Control the descent of your hand, as you slowly return to your starting position. Repeat __________ times. Complete this exercise __________ times per day.  STRENGTH - External Rotators  Secure a rubber exercise band or tubing to a fixed object (table, pole) so that it is at the same height as your right / left elbow when you are standing or sitting on a firm surface.  Stand or sit so that the secured exercise band is at your uninjured side.  Bend your right / left elbow 90 degrees. Place a folded towel or small pillow under your right / left arm, so that your elbow is a few inches away from your side.  Keeping the tension on the exercise band, pull it away from your body, as if pivoting on your elbow. Be sure to keep your body steady, so that the movement is coming only from your rotating shoulder.  Hold for __________ seconds. Release the tension in a controlled manner, as you return to the starting position. Repeat __________ times. Complete this  exercise __________ times per day.  STRENGTH - Internal Rotators   Secure a rubber exercise band or tubing to a fixed object (table, pole) so that it is at the same height as your right / left elbow when you are standing or sitting on a firm surface.  Stand or sit so that the secured exercise band is at your right / left side.  Bend your elbow 90 degrees. Place a folded towel or small pillow under your right / left arm so that your elbow is a few inches away from your side.  Keeping the tension on the exercise band, pull it across your body, toward your stomach. Be sure to keep your body steady, so that the  movement is coming only from your rotating shoulder.  Hold for __________ seconds. Release the tension in a controlled manner, as you return to the starting position. Repeat __________ times. Complete this exercise __________ times per day.  STRENGTH - Scapular Protractors, Standing   Stand arms length away from a wall. Place your hands on the wall, keeping your elbows straight.  Begin by dropping your shoulder blades down and toward your mid-back spine.  To strengthen your protractors, keep your shoulder blades down, but slide them forward on your rib cage. It will feel as if you are lifting the back of your rib cage away from the wall. This is a subtle motion and can be challenging to complete. Ask your caregiver for further instruction, if you are not sure you are doing the exercise correctly.  Hold for __________ seconds. Slowly return to the starting position, resting the muscles completely before starting the next repetition. Repeat __________ times. Complete this exercise __________ times per day. STRENGTH - Scapular Protractors, Supine  Lie on your back on a firm surface. Extend your right / left arm straight into the air while holding a __________ weight in your hand.  Keeping your head and back in place, lift your shoulder off the floor.  Hold for __________ seconds. Slowly  return to the starting position, and allow your muscles to relax completely before starting the next repetition. Repeat __________ times. Complete this exercise __________ times per day. STRENGTH - Scapular Protractors, Quadruped  Get onto your hands and knees, with your shoulders directly over your hands (or as close as you can be, comfortably).  Keeping your elbows locked, lift the back of your rib cage up into your shoulder blades, so your mid-back rounds out. Keep your neck muscles relaxed.  Hold this position for __________ seconds. Slowly return to the starting position and allow your muscles to relax completely before starting the next repetition. Repeat __________ times. Complete this exercise __________ times per day.  STRENGTH - Scapular Retractors  Secure a rubber exercise band or tubing to a fixed object (table, pole), so that it is at the height of your shoulders when you are either standing, or sitting on a firm armless chair.  With a palm down grip, grasp an end of the band in each hand. Straighten your elbows and lift your hands straight in front of you, at shoulder height. Step back, away from the secured end of the band, until it becomes tense.  Squeezing your shoulder blades together, draw your elbows back toward your sides, as you bend them. Keep your upper arms lifted away from your body throughout the exercise.  Hold for __________ seconds. Slowly ease the tension on the band, as you reverse the directions and return to the starting position. Repeat __________ times. Complete this exercise __________ times per day. STRENGTH - Shoulder Extensors   Secure a rubber exercise band or tubing to a fixed object (table, pole) so that it is at the height of your shoulders when you are either standing, or sitting on a firm armless chair.  With a thumbs-up grip, grasp an end of the band in each hand. Straighten your elbows and lift your hands straight in front of you, at shoulder  height. Step back, away from the secured end of the band, until it becomes tense.  Squeezing your shoulder blades together, pull your hands down to the sides of your thighs. Do not allow your hands to go behind you.  Hold for __________ seconds.  Slowly ease the tension on the band, as you reverse the directions and return to the starting position. Repeat __________ times. Complete this exercise __________ times per day.  STRENGTH - Scapular Retractors and External Rotators   Secure a rubber exercise band or tubing to a fixed object (table, pole) so that it is at the height as your shoulders, when you are either standing, or sitting on a firm armless chair.  With a palm down grip, grasp an end of the band in each hand. Bend your elbows 90 degrees and lift your elbows to shoulder height, at your sides. Step back, away from the secured end of the band, until it becomes tense.  Squeezing your shoulder blades together, rotate your shoulders so that your upper arms and elbows remain stationary, but your fists travel upward to head height.  Hold for __________ seconds. Slowly ease the tension on the band, as you reverse the directions and return to the starting position. Repeat __________ times. Complete this exercise __________ times per day.  STRENGTH - Scapular Retractors and External Rotators, Rowing   Secure a rubber exercise band or tubing to a fixed object (table, pole) so that it is at the height of your shoulders, when you are either standing, or sitting on a firm armless chair.  With a palm down grip, grasp an end of the band in each hand. Straighten your elbows and lift your hands straight in front of you, at shoulder height. Step back, away from the secured end of the band, until it becomes tense.  Step 1: Squeeze your shoulder blades together. Bending your elbows, draw your hands to your chest, as if you are rowing a boat. At the end of this motion, your hands and elbow should be at  shoulder height and your elbows should be out to your sides.  Step 2: Rotate your shoulders, to raise your hands above your head. Your forearms should be vertical and your upper arms should be horizontal.  Hold for __________ seconds. Slowly ease the tension on the band, as you reverse the directions and return to the starting position. Repeat __________ times. Complete this exercise __________ times per day.  STRENGTH - Scapular Depressors  Find a sturdy chair without wheels, such as a dining room chair.  Keeping your feet on the floor, and your hands on the chair arms, lift your bottom up from the seat, and lock your elbows.  Keeping your elbows straight, allow gravity to pull your body weight down. Your shoulders will rise toward your ears.  Raise your body against gravity by drawing your shoulder blades down your back, shortening the distance between your shoulders and ears. Although your feet should always maintain contact with the floor, your feet should progressively support less body weight, as you get stronger.  Hold for __________ seconds. In a controlled and slow manner, lower your body weight to begin the next repetition. Repeat __________ times. Complete this exercise __________ times per day.    This information is not intended to replace advice given to you by your health care provider. Make sure you discuss any questions you have with your health care provider.   Document Released: 11/14/2005 Document Revised: 12/05/2014 Document Reviewed: 02/26/2009 [employer] Interactive Patient Education [employer].

## 2016-07-28 NOTE — Progress Notes (Signed)
Subjective:    Patient ID: Gerald Jacobs, male    DOB: [birth year], 82 y.o.   MRN: [id number]  Chief Complaint  Patient presents with  . Shoulder Pain    bilateral shoulder pain, also has a pain in right hip that goes down leg, has trouble lifting his arms to comb hair, x1 week    HPI:  Gerald Jacobs is a 82 y.o. male who  has a past medical history of Cancer ([place]); Chronic anemia; Coronary artery disease; Diabetes mellitus; Essential hypertension; GERD (gastroesophageal reflux disease); History of gout; Mild renal insufficiency; Nocturia; and Osteoarthritis. and presents today for an office visit.  1.) Bilateral shoulder pain - This is a a new problem. Associated pain locatd in his bilateral shoulders that has been going on for about 1 week. Denies any trauma or injury that he can recall. Describes the pain as a sharp pain with a severity of excruciating. Pain generally occurs when is moving. Yesterday he was limited in his ability to comb his hair. Course of the symptoms are improving today. Denies numbness and tingling or neck pain. Can wake him up at night when lies on it. Modifying factors include Tylenol which helps a little.   2.) Hip pain -Associated symptom of pain located in his right hip and buttocks that is described as an ache generally occurs when he is driving for long periods of time. Relief is experienced when he stands up. Modifying factors include Tylenol which he is unsure of helping. No numbness/tingling located in his feet or lower leg. No changes in bowel habits or saddle anthesia.  3.) Myalgias - associated symptom of myalgias hemming going off and on for several months with short bursts of discomfort. Patient self discontinued atorvastatin is no significant improvement in symptoms.   Allergies  Allergen Reactions  . Colchicine Other (See Comments)    GI problems  . Flexeril [Cyclobenzaprine Hcl] Other (See Comments)    GI problems   . Percocet  [Oxycodone-Acetaminophen] Nausea And Vomiting  . Zebeta Other (See Comments)    Gi problems      Outpatient Medications Prior to Visit  Medication Sig Dispense Refill  . allopurinol (ZYLOPRIM) 100 MG tablet Take 100 mg by mouth daily.    Marland Kitchen aspirin 81 MG tablet Take 81 mg by mouth daily.      . Biotin 1000 MCG tablet Take 1,000 mcg by mouth daily.    . Blood Glucose Monitoring Suppl (ONE TOUCH ULTRA MINI) w/Device KIT Use the device to check your blood sugar 1-4 times daily as instructed. 1 each 0  . diphenhydramine-acetaminophen (TYLENOL PM) 25-500 MG TABS Take 1 tablet by mouth at bedtime as needed (sleep).    Marland Kitchen glucose blood test strip Use one strip per test. Check blood sugars 1-4 times per day as instructed. 100 each 12  . Lancets (ONETOUCH ULTRASOFT) lancets 1 each by Other route 3 (three) times daily. Use to check blood sugars up to three times a day Dx E11.9 100 each 3  . losartan-hydrochlorothiazide (HYZAAR) 100-12.5 MG tablet TAKE 1/2 TABLET BY MOUTH DAILY 45 tablet 3  . metoprolol succinate (TOPROL-XL) 50 MG 24 hr tablet Take 50 mg by mouth. Take 1 tablet by mouth in the morning and 1/2 tablet by mouth in the evening. Take with or immediately following a meal.    . Omega-3 Fatty Acids (FISH OIL PO) Take 1 capsule by mouth daily.    . sitaGLIPtin (JANUVIA) 50 MG tablet Take  50 mg by mouth daily.    Marland Kitchen atorvastatin (LIPITOR) 10 MG tablet Take 1 tablet (10 mg total) by mouth daily. 90 tablet 1   No facility-administered medications prior to visit.       Past Surgical History:  Procedure Laterality Date  . CHOLECYSTECTOMY  02/04/2014   DR Zella Richer  . CHOLECYSTECTOMY N/A 02/04/2014   Procedure: LAPAROSCOPIC CHOLECYSTECTOMY with intraoperative cholangiogram;  Surgeon: Odis Hollingshead, MD;  Location: [place];  Service: General;  Laterality: N/A;  . ERCP N/A 10/15/2013   Procedure: ENDOSCOPIC RETROGRADE CHOLANGIOPANCREATOGRAPHY (ERCP);  Surgeon: Jeryl Columbia, MD;  Location: [facility];  Service: Endoscopy;  Laterality: N/A;  . KNEE SURGERY Right 1979  . melanoma surgery  1970's   on scalp  . PROSTATE SURGERY  1993   Prostate Cancer  . SPHINCTEROTOMY  10/15/2013   Procedure: SPHINCTEROTOMY;  Surgeon: Jeryl Columbia, MD;  Location: [facility];  Service: Endoscopy;;      Past Medical History:  Diagnosis Date  . Cancer [facility])    prostate  . Chronic anemia    With normal iron studies and normal serum protein electrophoresis  . Coronary artery disease    Mild  . Diabetes mellitus    Adult onset  . Essential hypertension   . GERD (gastroesophageal reflux disease)    occ tums  . History of gout   . Mild renal insufficiency   . Nocturia    x2  . Osteoarthritis       Review of Systems  Constitutional: Negative for chills and fever.  Musculoskeletal:       Positive for right hip and bilateral shoulder pain.  Neurological: Negative for weakness and numbness.      Objective:    BP (!) 142/80 (BP Location: Left Arm, Patient Position: Sitting, Cuff Size: Normal)   Pulse 60   Temp 97.9 F (36.6 C) (Oral)   Resp 16   Ht '5\' 7"'  (1.702 m)   Wt 148 lb 12.8 oz (67.5 kg)   SpO2 98%   BMI 23.31 kg/m  Nursing note and vital signs reviewed.  Physical Exam  Constitutional: He is oriented to person, place, and time. He appears well-developed and well-nourished. No distress.  Cardiovascular: Normal rate, regular rhythm, normal heart sounds and intact distal pulses.   Pulmonary/Chest: Effort normal and breath sounds normal.  Musculoskeletal:  Bilateral shoulders - no obvious deformity, discoloration, or edema. Mild tenderness noted over subacromial space with no deformity or crepitus. Range of motion is normal bilaterally. Strength is 4+ in all directions. Distal pulses and sensation are intact and appropriate. There is no cervical pain. Negative empty can; negative Neer's impingement; negative Michel Bickers.  Right hip/gluteal - no obvious deformity,  discoloration, or edema noted. No palpable tenderness able to be elicited. Hip range of motion is normal with no discomfort. Strength is normal. Straight leg raise is negative. Faber's test is negative. Distal pulses and sensation are intact and appropriate.  Neurological: He is alert and oriented to person, place, and time.  Skin: Skin is warm and dry.  Psychiatric: He has a normal mood and affect. His behavior is normal. Judgment and thought content normal.       Assessment & Plan:   Problem List Items Addressed This Visit      Nervous and Auditory   Sciatica of right side    Symptoms and exam consistent with sciatica of the right side well-seated with no current symptoms and normal physical exam today.  Treat conservatively with ice/moist heat, home exercise therapy, and Tylenol as needed. Follow-up if symptoms worsen or do not improve.        Musculoskeletal and Integument   Statin myopathy    Myalgias appear consistent with statin myopathy given improvement of symptoms since discontinuation of atorvastatin. Recheck cholesterol in 2 months. Consider alternatives if necessary. Given stated diabetes it is recommended for a lipid-lowering education to prevent coronary artery disease.        Other   Bilateral shoulder pain - Primary    Bilateral shoulder pain with no significant findings upon physical exam. Appear consistent with rotator cuff tendinitis/bursitis. Unlikely tear given normal exam with good strength and full range of motion. Treat conservatively with ice, Tylenol, and home exercise therapy. If symptoms worsen or do not improve patient will follow up for additional imaging or therapy.       Other Visit Diagnoses   None.      I have discontinued Mr. Blystone's atorvastatin. I am also having him maintain his aspirin, diphenhydramine-acetaminophen, Biotin, Omega-3 Fatty Acids (FISH OIL PO), allopurinol, sitaGLIPtin, metoprolol succinate, ONE TOUCH ULTRA MINI, glucose blood,  onetouch ultrasoft, and losartan-hydrochlorothiazide.   Follow-up: Return if symptoms worsen or fail to improve.  Mauricio Po, FNP

## 2016-07-28 NOTE — Assessment & Plan Note (Signed)
Symptoms and exam consistent with sciatica of the right side well-seated with no current symptoms and normal physical exam today. Treat conservatively with ice/moist heat, home exercise therapy, and Tylenol as needed. Follow-up if symptoms worsen or do not improve.

## 2016-07-29 ENCOUNTER — Encounter: Payer: Self-pay | Admitting: Student

## 2016-08-02 ENCOUNTER — Telehealth: Payer: Self-pay

## 2016-08-02 ENCOUNTER — Other Ambulatory Visit: Payer: Self-pay | Admitting: Family

## 2016-08-02 MED ORDER — PREDNISONE 10 MG (21) PO TBPK: ORAL_TABLET | ORAL | 0 refills | Status: DC

## 2016-08-02 NOTE — Telephone Encounter (Signed)
Medication sent.

## 2016-08-02 NOTE — Telephone Encounter (Signed)
Pt called stating that he was dx with tendonitis when he was last seen in his shoulders. He is stating that he is not feeling any better. Is requesting prednisone. Please advise

## 2016-08-03 NOTE — Telephone Encounter (Signed)
Called pt to let him know rx was sent

## 2016-08-08 ENCOUNTER — Other Ambulatory Visit: Payer: Self-pay

## 2016-08-08 ENCOUNTER — Encounter (HOSPITAL_COMMUNITY)
Admission: RE | Admit: 2016-08-08 | Discharge: 2016-08-08 | Disposition: A | Discharge status: Home / Self Care | Payer: Medicare Other | Source: Ambulatory Visit | Attending: Nephrology | Admitting: Nephrology

## 2016-08-08 DIAGNOSIS — N183 Chronic kidney disease, stage 3 (moderate): Secondary | ICD-10-CM | POA: Insufficient documentation

## 2016-08-08 DIAGNOSIS — D638 Anemia in other chronic diseases classified elsewhere: Secondary | ICD-10-CM

## 2016-08-08 DIAGNOSIS — D631 Anemia in chronic kidney disease: Secondary | ICD-10-CM | POA: Diagnosis not present

## 2016-08-08 LAB — RENAL FUNCTION PANEL
ALBUMIN: 3.5 g/dL (ref 3.5–5.0)
ANION GAP: 9 (ref 5–15)
BUN: 43 mg/dL — ABNORMAL HIGH (ref 6–20)
CALCIUM: 9.3 mg/dL (ref 8.9–10.3)
CO2: 25 mmol/L (ref 22–32)
Chloride: 100 mmol/L — ABNORMAL LOW (ref 101–111)
Creatinine, Ser: 1.94 mg/dL — ABNORMAL HIGH (ref 0.61–1.24)
GFR, EST AFRICAN AMERICAN: 34 mL/min — AB (ref >60)
GFR, EST NON AFRICAN AMERICAN: 29 mL/min — AB (ref >60)
GLUCOSE: 192 mg/dL — AB (ref 65–99)
PHOSPHORUS: 3.2 mg/dL (ref 2.5–4.6)
Potassium: 3.8 mmol/L (ref 3.5–5.1)
SODIUM: 134 mmol/L — AB (ref 135–145)

## 2016-08-08 LAB — FERRITIN: Ferritin: 124 ng/mL (ref 24–336)

## 2016-08-08 LAB — IRON AND TIBC
Iron: 121 ug/dL (ref 45–182)
Saturation Ratios: 36 % (ref 17.9–39.5)
TIBC: 335 ug/dL (ref 250–450)
UIBC: 214 ug/dL

## 2016-08-08 LAB — POCT HEMOGLOBIN-HEMACUE: HEMOGLOBIN: 10.8 g/dL — AB (ref 13.0–17.0)

## 2016-08-08 MED ORDER — EPOETIN ALFA 10000 UNIT/ML IJ SOLN
INTRAMUSCULAR | Status: AC
  Administered 2016-08-08: 10000 [IU] via SUBCUTANEOUS
  Filled 2016-08-08: qty 1

## 2016-08-08 MED ORDER — EPOETIN ALFA 10000 UNIT/ML IJ SOLN: 30000.0000 [IU] | INTRAMUSCULAR | Status: DC

## 2016-08-08 MED ORDER — METOPROLOL SUCCINATE ER 50 MG PO TB24: ORAL_TABLET | ORAL | 3 refills | Status: DC

## 2016-08-08 MED ORDER — EPOETIN ALFA 20000 UNIT/ML IJ SOLN
INTRAMUSCULAR | Status: AC
  Administered 2016-08-08: 20000 [IU] via SUBCUTANEOUS
  Filled 2016-08-08: qty 1

## 2016-08-18 ENCOUNTER — Encounter: Payer: Self-pay | Admitting: Cardiovascular Disease

## 2016-08-29 ENCOUNTER — Encounter (HOSPITAL_COMMUNITY)
Admission: RE | Admit: 2016-08-29 | Discharge: 2016-08-29 | Disposition: A | Discharge status: Home / Self Care | Payer: Medicare Other | Source: Ambulatory Visit | Attending: Nephrology | Admitting: Nephrology

## 2016-08-29 DIAGNOSIS — D638 Anemia in other chronic diseases classified elsewhere: Secondary | ICD-10-CM

## 2016-08-29 DIAGNOSIS — N183 Chronic kidney disease, stage 3 (moderate): Secondary | ICD-10-CM | POA: Insufficient documentation

## 2016-08-29 DIAGNOSIS — D631 Anemia in chronic kidney disease: Secondary | ICD-10-CM | POA: Insufficient documentation

## 2016-08-29 LAB — POCT HEMOGLOBIN-HEMACUE: HEMOGLOBIN: 10.5 g/dL — AB (ref 13.0–17.0)

## 2016-08-29 MED ORDER — EPOETIN ALFA 10000 UNIT/ML IJ SOLN
INTRAMUSCULAR | Status: AC
  Filled 2016-08-29: qty 1

## 2016-08-29 MED ORDER — EPOETIN ALFA 20000 UNIT/ML IJ SOLN
INTRAMUSCULAR | Status: AC
  Administered 2016-08-29: 20000 [IU] via SUBCUTANEOUS
  Filled 2016-08-29: qty 1

## 2016-08-29 MED ORDER — EPOETIN ALFA 10000 UNIT/ML IJ SOLN
30000.0000 [IU] | INTRAMUSCULAR | Status: DC
  Administered 2016-08-29: 10000 [IU] via SUBCUTANEOUS

## 2016-09-02 ENCOUNTER — Encounter: Payer: Self-pay | Admitting: Cardiovascular Disease

## 2016-09-02 ENCOUNTER — Ambulatory Visit (INDEPENDENT_AMBULATORY_CARE_PROVIDER_SITE_OTHER): Payer: Medicare Other | Admitting: Cardiovascular Disease

## 2016-09-02 ENCOUNTER — Encounter (INDEPENDENT_AMBULATORY_CARE_PROVIDER_SITE_OTHER): Payer: Self-pay

## 2016-09-02 VITALS — BP 148/70 | HR 63 | Ht 67.0 in | Wt 148.6 lb

## 2016-09-02 DIAGNOSIS — I10 Essential (primary) hypertension: Secondary | ICD-10-CM | POA: Diagnosis not present

## 2016-09-02 NOTE — Progress Notes (Signed)
CARDIOLOGY OFFICE NOTE  Date:  09/02/2016    Gerald Jacobs Date of Birth: 06-Oct-1925 Medical Record #[id number]  PCP:  Gerald Po, FNP  Cardiologist:  Former patient of Dr. Sherryl Jacobs    Chief Complaint  Patient presents with  . Hypertension   Problem List 1. Essential HTN 2. DM 3. CKD - sees Gerald Jacobs,   baseline Cr = 1.94 4, Anemia - taking procrit    Notes from Gerald Jacobs:   Gerald Jacobs is a 82 y.o. male who presents today for a 4 month check. Former patient of Dr. Sherryl Jacobs.   He has a history of HTN, palpitations, dyslipidemia, diabetes, and mild renal insufficiency. Also has a history of hyperuricemia. He has anemia secondary to chronic renal disease and receives ejections of Procrit about every 3 weeks depending on his hemoglobin level. Dr. Posey Jacobs is his nephrologist.   He was last seen back in November and felt to be doing well. Had his Medicare well check last month by PCP.   Comes back today. Here alone. Wife was to be seen also today - but having shoulder issues due to a fall and has cancelled. He is doing ok. No chest pain. He is trying to stay active but notes he has slowed down some. Has stable DOE. No falls. No passing out. Occasional palpitations but nothing that really worries him or produces symptoms. He is pleased with how he is doing. He has a brother who is alive at 29.   Oct. 6, 2017: This is the first time meeting Gerald Jacobs . He is a transfer from [place]. He has a history of hypertension and diabetes Is doing well. Has some shoulder issues Also goes to the [place] [facility]   Plays golf on Thursdays - limited by his shoulder pain  No longer does his yard work  No CP ,  Occasional dyspnea - also has some anemia  BP is a bit high today . Takes his BP at home and the reading are typically quite good.  Retired from [employer] - retired in 1989.      Past Medical History:  Diagnosis Date  . Cancer [facility])    prostate  .  Chronic anemia    With normal iron studies and normal serum protein electrophoresis  . Coronary artery disease    Mild  . Diabetes mellitus    Adult onset  . Essential hypertension   . GERD (gastroesophageal reflux disease)    occ tums  . History of gout   . Mild renal insufficiency   . Nocturia    x2  . Osteoarthritis     Past Surgical History:  Procedure Laterality Date  . CHOLECYSTECTOMY  02/04/2014   DR Zella Richer  . CHOLECYSTECTOMY N/A 02/04/2014   Procedure: LAPAROSCOPIC CHOLECYSTECTOMY with intraoperative cholangiogram;  Surgeon: Odis Hollingshead, MD;  Location: [place];  Service: General;  Laterality: N/A;  . ERCP N/A 10/15/2013   Procedure: ENDOSCOPIC RETROGRADE CHOLANGIOPANCREATOGRAPHY (ERCP);  Surgeon: Jeryl Columbia, MD;  Location: [facility];  Service: Endoscopy;  Laterality: N/A;  . KNEE SURGERY Right 1979  . melanoma surgery  1970's   on scalp  . PROSTATE SURGERY  1993   Prostate Cancer  . SPHINCTEROTOMY  10/15/2013   Procedure: SPHINCTEROTOMY;  Surgeon: Jeryl Columbia, MD;  Location: [facility];  Service: Endoscopy;;     Medications: Current Outpatient Prescriptions  Medication Sig Dispense Refill  . allopurinol (ZYLOPRIM) 100 MG tablet Take 100 mg  by mouth daily.    Marland Kitchen aspirin 81 MG tablet Take 81 mg by mouth daily.      . Biotin 1000 MCG tablet Take 1,000 mcg by mouth daily.    . Blood Glucose Monitoring Suppl (ONE TOUCH ULTRA MINI) w/Device KIT Use the device to check your blood sugar 1-4 times daily as instructed. 1 each 0  . diphenhydramine-acetaminophen (TYLENOL PM) 25-500 MG TABS Take 1 tablet by mouth at bedtime as needed (sleep).    Marland Kitchen glucose blood test strip Use one strip per test. Check blood sugars 1-4 times per day as instructed. 100 each 12  . Lancets (ONETOUCH ULTRASOFT) lancets 1 each by Other route 3 (three) times daily. Use to check blood sugars up to three times a day Dx E11.9 100 each 3  . losartan-hydrochlorothiazide (HYZAAR) 100-12.5 MG  tablet TAKE 1/2 TABLET BY MOUTH DAILY 45 tablet 3  . metoprolol succinate (TOPROL-XL) 50 MG 24 hr tablet Take 1 tablet by mouth in the morning and 1/2 tablet by mouth in the evening. Take with or immediately following a meal. 135 tablet 3  . Omega-3 Fatty Acids (FISH OIL Jacobs) Take 1 capsule by mouth daily.    Glory Rosebush DELICA LANCETS 82N MISC TEST BLOOD SUGARS 1-4 TIMES A DAY AS INSTRUCTED 100 each 2  . predniSONE (STERAPRED UNI-PAK 21 TAB) 10 MG (21) TBPK tablet Take 6 tablets x 1 day, 5 tablets x 1 day, 4 tablets x 1 day, 3 tablets x 1 day, 2 tablets x 1 day, 1 tablet x 1 day 21 tablet 0  . sitaGLIPtin (JANUVIA) 50 MG tablet Take 50 mg by mouth daily.     No current facility-administered medications for this visit.     Allergies: Allergies  Allergen Reactions  . Colchicine Other (See Comments)    GI problems  . Flexeril [Cyclobenzaprine Hcl] Other (See Comments)    GI problems   . Percocet [Oxycodone-Acetaminophen] Nausea And Vomiting  . Zebeta Other (See Comments)    Gi problems    Social History: The patient  reports that he quit smoking about 37 years ago. His smoking use included Cigarettes. He has a 45.00 pack-year smoking history. He has never used smokeless tobacco. He reports that he drinks alcohol. He reports that he does not use drugs.   Family History: The patient's family history includes Stroke in his brother, brother, brother, father, mother, and sister.   Review of Systems: Please see the history of present illness.   Otherwise, the review of systems is positive for none.   All other systems are reviewed and negative.   Physical Exam: VS:  BP (!) 148/70 (BP Location: Left Arm, Patient Position: Sitting, Cuff Size: Normal)   Pulse 63   Ht '5\' 7"'  (1.702 m)   Wt 148 lb 9.6 oz (67.4 kg)   BMI 23.27 kg/m  .  BMI Body mass index is 23.27 kg/m.  Wt Readings from Last 3 Encounters:  09/02/16 148 lb 9.6 oz (67.4 kg)  07/28/16 148 lb 12.8 oz (67.5 kg)  02/16/16 154  lb (69.9 kg)    General: Pleasant. Looks younger than his stated age. Well developed, well nourished and in no acute distress.   HEENT: Normal.  Neck: Supple, no JVD, carotid bruits, or masses noted.  Cardiac: Regular rate and rhythm. No murmurs, rubs, or gallops. No edema.  Respiratory:  Lungs are clear to auscultation bilaterally with normal work of breathing.  GI: Soft and nontender.  MS: No  deformity or atrophy. Gait and ROM intact.  Skin: Warm and dry. Color is normal.  Neuro:  Strength and sensation are intact and no gross focal deficits noted.  Psych: Alert, appropriate and with normal affect.   LABORATORY DATA:  EKG:  EKG is ordered today. NSR with sinus arrhythmia  Lab Results  Component Value Date   WBC 6.2 01/22/2016   HGB 10.5 (L) 08/29/2016   HCT 35.1 (L) 01/22/2016   PLT 157.0 01/22/2016   GLUCOSE 192 (H) 08/08/2016   CHOL 119 01/22/2016   TRIG 155.0 (H) 01/22/2016   HDL 52.60 01/22/2016   LDLCALC 36 01/22/2016   ALT 14 01/22/2016   AST 15 01/22/2016   NA 134 (L) 08/08/2016   K 3.8 08/08/2016   CL 100 (L) 08/08/2016   CREATININE 1.94 (H) 08/08/2016   BUN 43 (H) 08/08/2016   CO2 25 08/08/2016   TSH 2.54 01/22/2016   INR 1.12 01/28/2014   HGBA1C 6.3 01/22/2016    BNP (last 3 results) No results for input(s): BNP in the last 8760 hours.  ProBNP (last 3 results) No results for input(s): PROBNP in the last 8760 hours.   Other Studies Reviewed Today:   Assessment/Plan: 1. 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  Encouraged him to exercise regularly  If possible   2. History of palpitations secondary to bigeminy PVCs - stable on current regimen.  3. CKD - followed by nephrology  4. DM - followed by PCP - last AIC was 6.3  5. History of gout - on allopurinol.  6. Hypercholesterolemia - recent labs reviewed. He remains on statin therapy. No changes made.   Current medicines are reviewed  with the patient today.  The patient does not have concerns regarding medicines other than what has been noted above.  The following changes have been made:  See above.  Labs/ tests ordered today include:   No orders of the defined types were placed in this encounter.   Mertie Moores, MD  09/02/2016 10:15 AM    [address] [place],  [place], [place]  [address] Pager [phone] Phone: [phone]; Fax: [phone]

## 2016-09-02 NOTE — Patient Instructions (Signed)

## 2016-09-19 ENCOUNTER — Encounter (HOSPITAL_COMMUNITY)
Admission: RE | Admit: 2016-09-19 | Discharge: 2016-09-19 | Disposition: A | Discharge status: Home / Self Care | Payer: Medicare Other | Source: Ambulatory Visit | Attending: Nephrology | Admitting: Nephrology

## 2016-09-19 DIAGNOSIS — D631 Anemia in chronic kidney disease: Secondary | ICD-10-CM | POA: Diagnosis not present

## 2016-09-19 DIAGNOSIS — D638 Anemia in other chronic diseases classified elsewhere: Secondary | ICD-10-CM

## 2016-09-19 LAB — IRON AND TIBC
IRON: 107 ug/dL (ref 45–182)
SATURATION RATIOS: 31 % (ref 17.9–39.5)
TIBC: 344 ug/dL (ref 250–450)
UIBC: 237 ug/dL

## 2016-09-19 LAB — RENAL FUNCTION PANEL
ALBUMIN: 3.5 g/dL (ref 3.5–5.0)
ANION GAP: 8 (ref 5–15)
BUN: 38 mg/dL — ABNORMAL HIGH (ref 6–20)
CHLORIDE: 105 mmol/L (ref 101–111)
CO2: 22 mmol/L (ref 22–32)
Calcium: 9.1 mg/dL (ref 8.9–10.3)
Creatinine, Ser: 2.07 mg/dL — ABNORMAL HIGH (ref 0.61–1.24)
GFR calc Af Amer: 31 mL/min — ABNORMAL LOW (ref >60)
GFR calc non Af Amer: 27 mL/min — ABNORMAL LOW (ref >60)
GLUCOSE: 266 mg/dL — AB (ref 65–99)
PHOSPHORUS: 3.9 mg/dL (ref 2.5–4.6)
POTASSIUM: 4.9 mmol/L (ref 3.5–5.1)
Sodium: 135 mmol/L (ref 135–145)

## 2016-09-19 LAB — FERRITIN: Ferritin: 137 ng/mL (ref 24–336)

## 2016-09-19 LAB — POCT HEMOGLOBIN-HEMACUE: HEMOGLOBIN: 10.7 g/dL — AB (ref 13.0–17.0)

## 2016-09-19 MED ORDER — EPOETIN ALFA 10000 UNIT/ML IJ SOLN
30000.0000 [IU] | INTRAMUSCULAR | Status: DC
  Administered 2016-09-19: 10000 [IU] via SUBCUTANEOUS

## 2016-09-19 MED ORDER — EPOETIN ALFA 20000 UNIT/ML IJ SOLN
INTRAMUSCULAR | Status: AC
  Administered 2016-09-19: 20000 [IU] via SUBCUTANEOUS
  Filled 2016-09-19: qty 1

## 2016-09-19 MED ORDER — EPOETIN ALFA 10000 UNIT/ML IJ SOLN
INTRAMUSCULAR | Status: AC
  Filled 2016-09-19: qty 1

## 2016-09-23 ENCOUNTER — Telehealth: Payer: Self-pay

## 2016-09-23 MED ORDER — ALLOPURINOL 100 MG PO TABS: 100.0000 mg | ORAL_TABLET | Freq: Every day | ORAL | 1 refills | Status: DC

## 2016-09-23 NOTE — Telephone Encounter (Signed)
Pt lmom and rq rf of allopurinol. Erx sent per rq to [facility].

## 2016-10-10 ENCOUNTER — Encounter (HOSPITAL_COMMUNITY): Payer: Medicare Other

## 2016-10-11 ENCOUNTER — Other Ambulatory Visit (HOSPITAL_COMMUNITY): Payer: Self-pay | Admitting: *Deleted

## 2016-10-12 ENCOUNTER — Encounter (HOSPITAL_COMMUNITY)
Admission: RE | Admit: 2016-10-12 | Discharge: 2016-10-12 | Disposition: A | Discharge status: Home / Self Care | Payer: Medicare Other | Source: Ambulatory Visit | Attending: Nephrology | Admitting: Nephrology

## 2016-10-12 DIAGNOSIS — D638 Anemia in other chronic diseases classified elsewhere: Secondary | ICD-10-CM

## 2016-10-12 DIAGNOSIS — D631 Anemia in chronic kidney disease: Secondary | ICD-10-CM | POA: Insufficient documentation

## 2016-10-12 DIAGNOSIS — N183 Chronic kidney disease, stage 3 (moderate): Secondary | ICD-10-CM | POA: Diagnosis not present

## 2016-10-12 LAB — POCT HEMOGLOBIN-HEMACUE: Hemoglobin: 10.1 g/dL — ABNORMAL LOW (ref 13.0–17.0)

## 2016-10-12 MED ORDER — EPOETIN ALFA 10000 UNIT/ML IJ SOLN
INTRAMUSCULAR | Status: AC
  Administered 2016-10-12: 10000 [IU]
  Filled 2016-10-12: qty 1

## 2016-10-12 MED ORDER — EPOETIN ALFA 10000 UNIT/ML IJ SOLN: 30000.0000 [IU] | INTRAMUSCULAR | Status: DC

## 2016-10-12 MED ORDER — EPOETIN ALFA 20000 UNIT/ML IJ SOLN
INTRAMUSCULAR | Status: AC
  Administered 2016-10-12: 20000 [IU]
  Filled 2016-10-12: qty 1

## 2016-10-27 ENCOUNTER — Encounter: Payer: Self-pay | Admitting: Internal Medicine

## 2016-10-27 ENCOUNTER — Ambulatory Visit (INDEPENDENT_AMBULATORY_CARE_PROVIDER_SITE_OTHER): Payer: Medicare Other | Admitting: Internal Medicine

## 2016-10-27 VITALS — BP 130/70 | HR 72 | Temp 98.1°F | Resp 20 | Wt 150.0 lb

## 2016-10-27 DIAGNOSIS — R079 Chest pain, unspecified: Secondary | ICD-10-CM | POA: Insufficient documentation

## 2016-10-27 DIAGNOSIS — R05 Cough: Secondary | ICD-10-CM

## 2016-10-27 DIAGNOSIS — R059 Cough, unspecified: Secondary | ICD-10-CM | POA: Insufficient documentation

## 2016-10-27 DIAGNOSIS — I119 Hypertensive heart disease without heart failure: Secondary | ICD-10-CM

## 2016-10-27 HISTORY — DX: Chest pain, unspecified: R07.9

## 2016-10-27 HISTORY — DX: Cough, unspecified: R05.9

## 2016-10-27 MED ORDER — LEVOFLOXACIN 250 MG PO TABS: 250.0000 mg | ORAL_TABLET | Freq: Every day | ORAL | 0 refills | Status: DC

## 2016-10-27 MED ORDER — HYDROCODONE-HOMATROPINE 5-1.5 MG/5ML PO SYRP: 5.0000 mL | ORAL_SOLUTION | Freq: Four times a day (QID) | ORAL | 0 refills | Status: AC | PRN

## 2016-10-27 NOTE — Assessment & Plan Note (Signed)
Mild to mod, c/w bronchitis vs pna, declines cxr, for antibx course, cough med prn,  to f/u any worsening symptoms or concerns 

## 2016-10-27 NOTE — Progress Notes (Signed)
Subjective:    Patient ID: Gerald Jacobs, male    DOB: [birth year], 82 y.o.   MRN: [id number]  HPI Here with acute onset mild to mod 2-3 days ST, HA, general weakness and malaise, with prod cough greenish sputum, but Pt denies increased sob or doe, wheezing, orthopnea, PND, increased LE swelling, palpitations, dizziness or syncope.  Has had sharp pleuritic mild left upper chest pain with radiation to the left periscapular  Has persistent, with a fall x 1 5 days ago, without injury.  Wife persisted to get him in today  Pt denies new neurological symptoms such as new headache, or facial or extremity weakness or numbness   Pt denies polydipsia, polyuria,  Pt states overall good compliance with meds Past Medical History:  Diagnosis Date  . Cancer [facility])    prostate  . Chronic anemia    With normal iron studies and normal serum protein electrophoresis  . Coronary artery disease    Mild  . Diabetes mellitus    Adult onset  . Essential hypertension   . GERD (gastroesophageal reflux disease)    occ tums  . History of gout   . Mild renal insufficiency   . Nocturia    x2  . Osteoarthritis    Past Surgical History:  Procedure Laterality Date  . CHOLECYSTECTOMY  02/04/2014   DR Zella Richer  . CHOLECYSTECTOMY N/A 02/04/2014   Procedure: LAPAROSCOPIC CHOLECYSTECTOMY with intraoperative cholangiogram;  Surgeon: Odis Hollingshead, MD;  Location: [place];  Service: General;  Laterality: N/A;  . ERCP N/A 10/15/2013   Procedure: ENDOSCOPIC RETROGRADE CHOLANGIOPANCREATOGRAPHY (ERCP);  Surgeon: Jeryl Columbia, MD;  Location: [facility];  Service: Endoscopy;  Laterality: N/A;  . KNEE SURGERY Right 1979  . melanoma surgery  1970's   on scalp  . PROSTATE SURGERY  1993   Prostate Cancer  . SPHINCTEROTOMY  10/15/2013   Procedure: SPHINCTEROTOMY;  Surgeon: Jeryl Columbia, MD;  Location: [facility];  Service: Endoscopy;;    reports that he quit smoking about 37 years ago. His smoking use included  Cigarettes. He has a 45.00 pack-year smoking history. He has never used smokeless tobacco. He reports that he drinks alcohol. He reports that he does not use drugs. family history includes Stroke in his brother, brother, brother, father, mother, and sister. Allergies  Allergen Reactions  . Colchicine Other (See Comments)    GI problems  . Flexeril [Cyclobenzaprine Hcl] Other (See Comments)    GI problems   . Percocet [Oxycodone-Acetaminophen] Nausea And Vomiting  . Zebeta Other (See Comments)    Gi problems   Current Outpatient Prescriptions on File Prior to Visit  Medication Sig Dispense Refill  . allopurinol (ZYLOPRIM) 100 MG tablet Take 1 tablet (100 mg total) by mouth daily. 90 tablet 1  . aspirin 81 MG tablet Take 81 mg by mouth daily.      . Biotin 1000 MCG tablet Take 1,000 mcg by mouth daily.    . Blood Glucose Monitoring Suppl (ONE TOUCH ULTRA MINI) w/Device KIT Use the device to check your blood sugar 1-4 times daily as instructed. 1 each 0  . diphenhydramine-acetaminophen (TYLENOL PM) 25-500 MG TABS Take 1 tablet by mouth at bedtime as needed (sleep).    Marland Kitchen glucose blood test strip Use one strip per test. Check blood sugars 1-4 times per day as instructed. 100 each 12  . Lancets (ONETOUCH ULTRASOFT) lancets 1 each by Other route 3 (three) times daily. Use to check blood sugars up  to three times a day Dx E11.9 100 each 3  . losartan-hydrochlorothiazide (HYZAAR) 100-12.5 MG tablet TAKE 1/2 TABLET BY MOUTH DAILY 45 tablet 3  . metoprolol succinate (TOPROL-XL) 50 MG 24 hr tablet Take 1 tablet by mouth in the morning and 1/2 tablet by mouth in the evening. Take with or immediately following a meal. 135 tablet 3  . Omega-3 Fatty Acids (FISH OIL PO) Take 1 capsule by mouth daily.    Glory Rosebush DELICA LANCETS 42A MISC TEST BLOOD SUGARS 1-4 TIMES A DAY AS INSTRUCTED 100 each 2  . predniSONE (STERAPRED UNI-PAK 21 TAB) 10 MG (21) TBPK tablet Take 6 tablets x 1 day, 5 tablets x 1 day, 4  tablets x 1 day, 3 tablets x 1 day, 2 tablets x 1 day, 1 tablet x 1 day 21 tablet 0  . sitaGLIPtin (JANUVIA) 50 MG tablet Take 50 mg by mouth daily.     No current facility-administered medications on file prior to visit.    Review of Systems  Constitutional: Negative for unusual diaphoresis or night sweats HENT: Negative for ear swelling or discharge Eyes: Negative for worsening visual haziness  Respiratory: Negative for choking and stridor.   Gastrointestinal: Negative for distension or worsening eructation Genitourinary: Negative for retention or change in urine volume.  Musculoskeletal: Negative for other MSK pain or swelling Skin: Negative for color change and worsening wound Neurological: Negative for tremors and numbness other than noted  Psychiatric/Behavioral: Negative for decreased concentration or agitation other than above   All other system neg per pt    Objective:   Physical Exam BP 130/70   Pulse 72   Temp 98.1 F (36.7 C) (Oral)   Resp 20   Wt 150 lb (68 kg)   SpO2 98%   BMI 23.49 kg/m  VS noted, mild ill Constitutional: Pt appears in no apparent distress HENT: Head: NCAT.  Right Ear: External ear normal.  Left Ear: External ear normal.  Bilat tm's with mild erythema.  Max sinus areas non tender.  Pharynx with mild erythema, no exudate Eyes: . Pupils are equal, round, and reactive to light. Conjunctivae and EOM are normal Neck: Normal range of motion. Neck supple.  Cardiovascular: Normal rate and regular rhythm.   Pulmonary/Chest: Effort normal and breath sounds decreased without rales or wheezing.  Neurological: Pt is alert. Not confused , motor grossly intact Skin: Skin is warm. No rash, no LE edema Psychiatric: Pt behavior is normal. No agitation.  No other new exam changes    Assessment & Plan:

## 2016-10-27 NOTE — Assessment & Plan Note (Signed)
C/w msk vs pleurisy, very low suspicion cardiac, declines ecg,  to f/u any worsening symptoms or concerns

## 2016-10-27 NOTE — Patient Instructions (Signed)
Please take all new medication as prescribed - the antibiotic, and cough medicine if needed  Please continue all other medications as before, and refills have been done if requested.  Please have the pharmacy call with any other refills you may need.  Please keep your appointments with your specialists as you may have planned     

## 2016-10-27 NOTE — Assessment & Plan Note (Signed)
stable overall by history and exam, recent data reviewed with pt, and pt to continue medical treatment as before,  to f/u any worsening symptoms or concerns BP Readings from Last 3 Encounters:  10/27/16 130/70  10/12/16 (!) 145/65  09/19/16 (!) 172/58

## 2016-10-27 NOTE — Progress Notes (Signed)
Pre visit review using our clinic review tool, if applicable. No additional management support is needed unless otherwise documented below in the visit note. 

## 2016-10-28 ENCOUNTER — Telehealth: Payer: Self-pay | Admitting: Family

## 2016-10-28 MED ORDER — AZITHROMYCIN 250 MG PO TABS: ORAL_TABLET | ORAL | 1 refills | Status: DC

## 2016-10-28 NOTE — Telephone Encounter (Signed)
Ok for change levaquin to zpack x 1

## 2016-10-28 NOTE — Telephone Encounter (Signed)
Patient states Dr. Jenny Reichmann prescribed him levofloxacin yesterday.  Patient states this medication kept him up all night and he was not "thinking clear" while taking it.  He would like to know if a different medication can be sent to [facility] in [place].

## 2016-10-31 ENCOUNTER — Encounter (HOSPITAL_COMMUNITY)
Admission: RE | Admit: 2016-10-31 | Discharge: 2016-10-31 | Disposition: A | Discharge status: Home / Self Care | Payer: Medicare Other | Source: Ambulatory Visit | Attending: Nephrology | Admitting: Nephrology

## 2016-10-31 DIAGNOSIS — N183 Chronic kidney disease, stage 3 (moderate): Secondary | ICD-10-CM | POA: Diagnosis not present

## 2016-10-31 DIAGNOSIS — D638 Anemia in other chronic diseases classified elsewhere: Secondary | ICD-10-CM

## 2016-10-31 DIAGNOSIS — D631 Anemia in chronic kidney disease: Secondary | ICD-10-CM | POA: Diagnosis present

## 2016-10-31 LAB — RENAL FUNCTION PANEL
ALBUMIN: 3.1 g/dL — AB (ref 3.5–5.0)
Anion gap: 9 (ref 5–15)
BUN: 32 mg/dL — AB (ref 6–20)
CALCIUM: 9 mg/dL (ref 8.9–10.3)
CO2: 23 mmol/L (ref 22–32)
CREATININE: 2.08 mg/dL — AB (ref 0.61–1.24)
Chloride: 97 mmol/L — ABNORMAL LOW (ref 101–111)
GFR calc Af Amer: 31 mL/min — ABNORMAL LOW (ref >60)
GFR, EST NON AFRICAN AMERICAN: 27 mL/min — AB (ref >60)
GLUCOSE: 241 mg/dL — AB (ref 65–99)
PHOSPHORUS: 4 mg/dL (ref 2.5–4.6)
POTASSIUM: 4.6 mmol/L (ref 3.5–5.1)
SODIUM: 129 mmol/L — AB (ref 135–145)

## 2016-10-31 LAB — IRON AND TIBC
Iron: 74 ug/dL (ref 45–182)
SATURATION RATIOS: 26 % (ref 17.9–39.5)
TIBC: 281 ug/dL (ref 250–450)
UIBC: 207 ug/dL

## 2016-10-31 LAB — FERRITIN: Ferritin: 266 ng/mL (ref 24–336)

## 2016-10-31 LAB — POCT HEMOGLOBIN-HEMACUE: HEMOGLOBIN: 10.3 g/dL — AB (ref 13.0–17.0)

## 2016-10-31 MED ORDER — EPOETIN ALFA 10000 UNIT/ML IJ SOLN
INTRAMUSCULAR | Status: AC
  Administered 2016-10-31: 10000 [IU] via SUBCUTANEOUS
  Filled 2016-10-31: qty 1

## 2016-10-31 MED ORDER — EPOETIN ALFA 20000 UNIT/ML IJ SOLN
INTRAMUSCULAR | Status: AC
  Administered 2016-10-31: 20000 [IU] via SUBCUTANEOUS
  Filled 2016-10-31: qty 1

## 2016-10-31 MED ORDER — EPOETIN ALFA 10000 UNIT/ML IJ SOLN: 30000.0000 [IU] | INTRAMUSCULAR | Status: DC

## 2016-11-22 ENCOUNTER — Encounter (HOSPITAL_COMMUNITY): Payer: Medicare Other

## 2016-11-24 ENCOUNTER — Encounter (HOSPITAL_COMMUNITY)
Admission: RE | Admit: 2016-11-24 | Discharge: 2016-11-24 | Disposition: A | Discharge status: Home / Self Care | Payer: Medicare Other | Source: Ambulatory Visit | Attending: Nephrology | Admitting: Nephrology

## 2016-11-24 DIAGNOSIS — D631 Anemia in chronic kidney disease: Secondary | ICD-10-CM | POA: Diagnosis not present

## 2016-11-24 DIAGNOSIS — D638 Anemia in other chronic diseases classified elsewhere: Secondary | ICD-10-CM

## 2016-11-24 LAB — POCT HEMOGLOBIN-HEMACUE: Hemoglobin: 10.7 g/dL — ABNORMAL LOW (ref 13.0–17.0)

## 2016-11-24 MED ORDER — EPOETIN ALFA 10000 UNIT/ML IJ SOLN
INTRAMUSCULAR | Status: AC
  Administered 2016-11-24: 10000 [IU] via SUBCUTANEOUS
  Filled 2016-11-24: qty 1

## 2016-11-24 MED ORDER — EPOETIN ALFA 10000 UNIT/ML IJ SOLN: 30000.0000 [IU] | INTRAMUSCULAR | Status: DC

## 2016-11-24 MED ORDER — EPOETIN ALFA 20000 UNIT/ML IJ SOLN
INTRAMUSCULAR | Status: AC
  Administered 2016-11-24: 20000 [IU] via SUBCUTANEOUS
  Filled 2016-11-24: qty 1

## 2016-12-12 ENCOUNTER — Encounter (HOSPITAL_COMMUNITY): Payer: Medicare Other

## 2016-12-14 ENCOUNTER — Inpatient Hospital Stay (HOSPITAL_COMMUNITY): Admission: RE | Admit: 2016-12-14 | Payer: Medicare Other | Source: Ambulatory Visit

## 2016-12-16 ENCOUNTER — Inpatient Hospital Stay (HOSPITAL_COMMUNITY): Admission: RE | Admit: 2016-12-16 | Payer: Medicare Other | Source: Ambulatory Visit

## 2016-12-20 ENCOUNTER — Encounter (HOSPITAL_COMMUNITY)
Admission: RE | Admit: 2016-12-20 | Discharge: 2016-12-20 | Disposition: A | Discharge status: Home / Self Care | Payer: Medicare Other | Source: Ambulatory Visit | Attending: Nephrology | Admitting: Nephrology

## 2016-12-20 DIAGNOSIS — N183 Chronic kidney disease, stage 3 (moderate): Secondary | ICD-10-CM | POA: Insufficient documentation

## 2016-12-20 DIAGNOSIS — D631 Anemia in chronic kidney disease: Secondary | ICD-10-CM | POA: Diagnosis present

## 2016-12-20 DIAGNOSIS — D638 Anemia in other chronic diseases classified elsewhere: Secondary | ICD-10-CM

## 2016-12-20 LAB — RENAL FUNCTION PANEL
ALBUMIN: 3.3 g/dL — AB (ref 3.5–5.0)
ANION GAP: 7 (ref 5–15)
BUN: 38 mg/dL — ABNORMAL HIGH (ref 6–20)
CALCIUM: 9.1 mg/dL (ref 8.9–10.3)
CO2: 24 mmol/L (ref 22–32)
CREATININE: 2.17 mg/dL — AB (ref 0.61–1.24)
Chloride: 103 mmol/L (ref 101–111)
GFR calc non Af Amer: 25 mL/min — ABNORMAL LOW (ref >60)
GFR, EST AFRICAN AMERICAN: 29 mL/min — AB (ref >60)
Glucose, Bld: 233 mg/dL — ABNORMAL HIGH (ref 65–99)
PHOSPHORUS: 3.3 mg/dL (ref 2.5–4.6)
Potassium: 4.4 mmol/L (ref 3.5–5.1)
SODIUM: 134 mmol/L — AB (ref 135–145)

## 2016-12-20 LAB — IRON AND TIBC
Iron: 110 ug/dL (ref 45–182)
Saturation Ratios: 35 % (ref 17.9–39.5)
TIBC: 311 ug/dL (ref 250–450)
UIBC: 201 ug/dL

## 2016-12-20 LAB — POCT HEMOGLOBIN-HEMACUE: Hemoglobin: 12.4 g/dL — ABNORMAL LOW (ref 13.0–17.0)

## 2016-12-20 LAB — FERRITIN: FERRITIN: 209 ng/mL (ref 24–336)

## 2016-12-20 MED ORDER — EPOETIN ALFA 10000 UNIT/ML IJ SOLN: 30000.0000 [IU] | INTRAMUSCULAR | Status: DC

## 2017-01-02 ENCOUNTER — Encounter (HOSPITAL_COMMUNITY)
Admission: RE | Admit: 2017-01-02 | Discharge: 2017-01-02 | Disposition: A | Discharge status: Home / Self Care | Payer: Medicare Other | Source: Ambulatory Visit | Attending: Nephrology | Admitting: Nephrology

## 2017-01-02 DIAGNOSIS — D631 Anemia in chronic kidney disease: Secondary | ICD-10-CM | POA: Diagnosis not present

## 2017-01-02 DIAGNOSIS — N183 Chronic kidney disease, stage 3 (moderate): Secondary | ICD-10-CM | POA: Insufficient documentation

## 2017-01-02 DIAGNOSIS — D638 Anemia in other chronic diseases classified elsewhere: Secondary | ICD-10-CM

## 2017-01-02 LAB — POCT HEMOGLOBIN-HEMACUE: HEMOGLOBIN: 11.8 g/dL — AB (ref 13.0–17.0)

## 2017-01-02 MED ORDER — EPOETIN ALFA 10000 UNIT/ML IJ SOLN: 30000.0000 [IU] | INTRAMUSCULAR | Status: DC

## 2017-01-16 LAB — HM DIABETES EYE EXAM

## 2017-01-23 ENCOUNTER — Encounter: Payer: Self-pay | Admitting: Family

## 2017-01-23 ENCOUNTER — Encounter (HOSPITAL_COMMUNITY): Payer: Medicare Other

## 2017-01-24 ENCOUNTER — Encounter: Payer: Self-pay | Admitting: Family

## 2017-01-24 ENCOUNTER — Ambulatory Visit (INDEPENDENT_AMBULATORY_CARE_PROVIDER_SITE_OTHER): Payer: Medicare Other | Admitting: Family

## 2017-01-24 VITALS — BP 148/62 | HR 50 | Temp 98.0°F | Resp 16 | Ht 67.0 in | Wt 145.4 lb

## 2017-01-24 DIAGNOSIS — Z0000 Encounter for general adult medical examination without abnormal findings: Secondary | ICD-10-CM

## 2017-01-24 DIAGNOSIS — E119 Type 2 diabetes mellitus without complications: Secondary | ICD-10-CM | POA: Diagnosis not present

## 2017-01-24 DIAGNOSIS — I10 Essential (primary) hypertension: Secondary | ICD-10-CM

## 2017-01-24 DIAGNOSIS — E785 Hyperlipidemia, unspecified: Secondary | ICD-10-CM

## 2017-01-24 NOTE — Assessment & Plan Note (Signed)
Obtain hemoglobin A1c and urine microalbumin. Diabetic foot exam completed today. Pneumovax is up-to-date. Diabetic eye exam is up-to-date. Continue with lifestyle management and Januvia. Follow-up and changes pending A1c results.

## 2017-01-24 NOTE — Assessment & Plan Note (Signed)
Reviewed and updated patient's medical, surgical, family and social history. Medications and allergies were also reviewed. Basic screenings for depression, activities of daily living, hearing, cognition and safety were performed. Provider list was updated and health plan was provided to the patient.  

## 2017-01-24 NOTE — Assessment & Plan Note (Signed)
Maintained on lifestyle management and fish oil. Obtain lipid profile. Continue current dosage of fish oil pending lipid profile results.

## 2017-01-24 NOTE — Assessment & Plan Note (Signed)
1) Anticipatory Guidance: Discussed importance of wearing a seatbelt while driving and not texting while driving; changing batteries in smoke detector at least once annually; wearing suntan lotion when outside; eating a balanced and moderate diet; getting physical activity at least 30 minutes per day.  2) Immunizations / Screenings / Labs:  Declines tetanus. All other immunizations are up-to-date per recommendations. Due for diabetic foot exam completed today. Obtain PSA for prostate cancer screening. Obtain hemoglobin A1c and urine microalbumin. All other screenings are up-to-date per recommendations. Obtain CBC, CMET, and lipid profile.    Overall well exam with risk factors for cardiovascular disease including hypertension, diabetes, and dyslipidemia. Blood pressure slightly elevated above goal with other chronic conditions appearing well controlled. Continue healthy lifestyle behaviors and choices. Follow-up prevention exam in 1 year. Follow-up office visit pending blood work and for chronic conditions. Note he is also managed through the [facility].

## 2017-01-24 NOTE — Progress Notes (Signed)
Subjective:    Patient ID: Gerald Jacobs, male    DOB: Sep 08, 1925, 83 y.o.   MRN: [id number]  Chief Complaint  Patient presents with  . CPE    not fasting    HPI:  Gerald Jacobs is a 83 y.o. male who presents today for a Medicare Annual Wellness/Physical exam.    1) Health Maintenance -   Diet - Averaging about 3 meals per day consisting of a regular diet; trying to reduce sugar; Caffeine 2-3 cups per days on average.   Exercise - Little exercises for his shoulders and walks on occasion.   2) Preventative Exams / Immunizations:  Dental -- Up to date  Vision -- Up to date   Health Maintenance  Topic Date Due  . FOOT EXAM  10/21/1937  . TETANUS/TDAP  10/21/1946  . HEMOGLOBIN A1C  07/21/2016  . OPHTHALMOLOGY EXAM  01/16/2018  . INFLUENZA VACCINE  Completed  . PNA vac Low Risk Adult  Completed     Immunization History  Administered Date(s) Administered  . Influenza,inj,Quad PF,36+ Mos 09/30/2015  . Influenza-Unspecified 08/28/2014  . Pneumococcal Conjugate-13 01/18/2016  . Pneumococcal Polysaccharide-23 02/05/2014    RISK FACTORS  Tobacco History  Smoking Status  . Former Smoker  . Packs/day: 1.00  . Years: 45.00  . Types: Cigarettes  . Quit date: 09/07/1979  Smokeless Tobacco  . Never Used     Cardiac risk factors: advanced age (older than 79 for men, 57 for women), diabetes mellitus, dyslipidemia, hypertension and male gender.  Depression Screen  Depression screen [facility] 2/9 01/24/2017  Decreased Interest 0  Down, Depressed, Hopeless 0  PHQ - 2 Score 0  Altered sleeping -  Tired, decreased energy -  Change in appetite -  Feeling bad or failure about yourself  -  Trouble concentrating -  Moving slowly or fidgety/restless -  Suicidal thoughts -  PHQ-9 Score -  Difficult doing work/chores -     Activities of Daily Living In your present state of health, do you have any difficulty performing the following activities?:  Driving?  No Managing money?  No Feeding yourself? No Getting from bed to chair? No Climbing a flight of stairs? No Preparing food and eating?: No Bathing or showering? No Getting dressed: No Getting to the toilet? No Using the toilet: No Moving around from place to place: No In the past year have you fallen or had a near fall?:No   Home Safety Has smoke detector and wears seat belts. No firearms. No excess sun exposure. Are there smokers in your home (other than you)?  No Do you feel safe at home?  Yes  Hearing Difficulties: Hearing aids  Do you often ask people to speak up or repeat themselves? No Do you experience ringing or noises in your ears? No  Do you have difficulty understanding soft or whispered voices? No    Cognitive Testing  Alert? Yes   Normal Appearance? Yes  Oriented to person? Yes  Place? Yes   Time? Yes  Recall of three objects?  Yes  Can perform simple calculations? Yes  Displays appropriate judgment? Yes  Can read the correct time from a watch face? Yes  Do you feel that you have a problem with memory? No  Do you often misplace items? No   Advanced Directives have been discussed with the patient? Yes   Current Physicians/Providers and Suppliers  1. Terri Piedra, FNP - Internal Medicine 2. Cleatrice Burke, MD - Cardiology  Indicate  any recent Medical Services you may have received from other than [facility] providers in the past year (date may be approximate).  All answers were reviewed with the patient and necessary referrals were made:  Mauricio Po, [place]   01/24/2017    Allergies  Allergen Reactions  . Colchicine Other (See Comments)    GI problems  . Flexeril [Cyclobenzaprine Hcl] Other (See Comments)    GI problems   . Levaquin [Levofloxacin In D5w] Other (See Comments)    insomnia  . Percocet [Oxycodone-Acetaminophen] Nausea And Vomiting  . Zebeta Other (See Comments)    Gi problems     Outpatient Medications Prior to Visit  Medication Sig  Dispense Refill  . allopurinol (ZYLOPRIM) 100 MG tablet Take 1 tablet (100 mg total) by mouth daily. 90 tablet 1  . aspirin 81 MG tablet Take 81 mg by mouth daily.      . Biotin 1000 MCG tablet Take 1,000 mcg by mouth daily.    . Blood Glucose Monitoring Suppl (ONE TOUCH ULTRA MINI) w/Device KIT Use the device to check your blood sugar 1-4 times daily as instructed. 1 each 0  . diphenhydramine-acetaminophen (TYLENOL PM) 25-500 MG TABS Take 1 tablet by mouth at bedtime as needed (sleep).    Marland Kitchen glucose blood test strip Use one strip per test. Check blood sugars 1-4 times per day as instructed. 100 each 12  . Lancets (ONETOUCH ULTRASOFT) lancets 1 each by Other route 3 (three) times daily. Use to check blood sugars up to three times a day Dx E11.9 100 each 3  . losartan-hydrochlorothiazide (HYZAAR) 100-12.5 MG tablet TAKE 1/2 TABLET BY MOUTH DAILY 45 tablet 3  . metoprolol succinate (TOPROL-XL) 50 MG 24 hr tablet Take 1 tablet by mouth in the morning and 1/2 tablet by mouth in the evening. Take with or immediately following a meal. 135 tablet 3  . Omega-3 Fatty Acids (FISH OIL PO) Take 1 capsule by mouth daily.    Glory Rosebush DELICA LANCETS 21H MISC TEST BLOOD SUGARS 1-4 TIMES A DAY AS INSTRUCTED 100 each 2  . sitaGLIPtin (JANUVIA) 50 MG tablet Take 50 mg by mouth daily.    Marland Kitchen azithromycin (ZITHROMAX Z-PAK) 250 MG tablet 2 tab by mouth on day 1, then 1 per day 6 tablet 1  . predniSONE (STERAPRED UNI-PAK 21 TAB) 10 MG (21) TBPK tablet Take 6 tablets x 1 day, 5 tablets x 1 day, 4 tablets x 1 day, 3 tablets x 1 day, 2 tablets x 1 day, 1 tablet x 1 day 21 tablet 0   No facility-administered medications prior to visit.      Past Medical History:  Diagnosis Date  . Cancer [facility])    prostate  . Chronic anemia    With normal iron studies and normal serum protein electrophoresis  . Coronary artery disease    Mild  . Diabetes mellitus    Adult onset  . Essential hypertension   . GERD (gastroesophageal  reflux disease)    occ tums  . History of gout   . Mild renal insufficiency   . Nocturia    x2  . Osteoarthritis      Past Surgical History:  Procedure Laterality Date  . CHOLECYSTECTOMY  02/04/2014   DR Zella Richer  . CHOLECYSTECTOMY N/A 02/04/2014   Procedure: LAPAROSCOPIC CHOLECYSTECTOMY with intraoperative cholangiogram;  Surgeon: Odis Hollingshead, MD;  Location: [facility];  Service: General;  Laterality: N/A;  . ERCP N/A 10/15/2013   Procedure: ENDOSCOPIC RETROGRADE  CHOLANGIOPANCREATOGRAPHY (ERCP);  Surgeon: Jeryl Columbia, MD;  Location: [facility];  Service: Endoscopy;  Laterality: N/A;  . KNEE SURGERY Right 1979  . melanoma surgery  1970's   on scalp  . PROSTATE SURGERY  1993   Prostate Cancer  . SPHINCTEROTOMY  10/15/2013   Procedure: SPHINCTEROTOMY;  Surgeon: Jeryl Columbia, MD;  Location: [facility];  Service: Endoscopy;;     Family History  Problem Relation Age of Onset  . Stroke Father   . Stroke Mother   . Stroke Sister   . Stroke Brother   . Stroke Brother   . Stroke Brother      Social History   Social History  . Marital status: Married    Spouse name: N/A  . Number of children: 2  . Years of education: 14   Occupational History  . Not on file.   Social History Main Topics  . Smoking status: Former Smoker    Packs/day: 1.00    Years: 45.00    Types: Cigarettes    Quit date: 09/07/1979  . Smokeless tobacco: Never Used  . Alcohol use Yes     Comment: beer rarely  . Drug use: No  . Sexual activity: Not on file   Other Topics Concern  . Not on file   Social History Narrative   Fun/Hobby: Playing golf when he can.      Review of Systems  Constitutional: Denies fever, chills, fatigue, or significant weight gain/loss. HENT: Head: Denies headache or neck pain Ears: Denies changes in hearing, ringing in ears, earache, drainage Nose: Denies discharge, stuffiness, itching, nosebleed, sinus pain Throat: Denies sore throat, hoarseness, dry  mouth, sores, thrush Eyes: Denies loss/changes in vision, pain, redness, blurry/double vision, flashing lights Cardiovascular: Denies chest pain/discomfort, tightness, palpitations, shortness of breath with activity, difficulty lying down, swelling, sudden awakening with shortness of breath Respiratory: Denies shortness of breath, cough, sputum production, wheezing Gastrointestinal: Denies dysphasia, heartburn, change in appetite, nausea, change in bowel habits, rectal bleeding, constipation, diarrhea, yellow skin or eyes Genitourinary: Denies frequency, urgency, burning/pain, blood in urine, incontinence, change in urinary strength. Musculoskeletal: Denies muscle/joint pain, stiffness, back pain, redness or swelling of joints, trauma Skin: Denies rashes, lumps, itching, dryness, color changes, or hair/nail changes Neurological: Denies dizziness, fainting, seizures, weakness, numbness, tingling, tremor Psychiatric - Denies nervousness, stress, depression or memory loss Endocrine: Denies heat or cold intolerance, sweating, frequent urination, excessive thirst, changes in appetite Hematologic: Denies ease of bruising or bleeding    Objective:     BP (!) 148/62 (BP Location: Left Arm, Patient Position: Sitting, Cuff Size: Normal)   Pulse (!) 50   Temp 98 F (36.7 C) (Oral)   Resp 16   Ht '5\' 7"'  (1.702 m)   Wt 145 lb 6.4 oz (66 kg)   SpO2 98%   BMI 22.77 kg/m  Nursing note and vital signs reviewed.  Physical Exam  Constitutional: He is oriented to person, place, and time. He appears well-developed and well-nourished.  HENT:  Head: Normocephalic.  Right Ear: Hearing, tympanic membrane, external ear and ear canal normal.  Left Ear: Hearing, tympanic membrane, external ear and ear canal normal.  Nose: Nose normal.  Mouth/Throat: Uvula is midline, oropharynx is clear and moist and mucous membranes are normal.  Eyes: Conjunctivae and EOM are normal. Pupils are equal, round, and reactive to  light.  Neck: Neck supple. No JVD present. No tracheal deviation present. No thyromegaly present.  Cardiovascular: Normal rate, regular rhythm, normal heart  sounds and intact distal pulses.   Pulmonary/Chest: Effort normal and breath sounds normal.  Abdominal: Soft. Bowel sounds are normal. He exhibits no distension and no mass. There is no tenderness. There is no rebound and no guarding.  Musculoskeletal: Normal range of motion. He exhibits no edema or tenderness.  Lymphadenopathy:    He has no cervical adenopathy.  Neurological: He is alert and oriented to person, place, and time. He has normal reflexes. No cranial nerve deficit. He exhibits normal muscle tone. Coordination normal.  Skin: Skin is warm and dry.  Psychiatric: He has a normal mood and affect. His behavior is normal. Judgment and thought content normal.       Assessment & Plan:   During the course of the visit the patient was educated and counseled about appropriate screening and preventive services including:    Pneumococcal vaccine   Influenza vaccine  Prostate cancer screening  Diabetes screening  Glaucoma screening  Nutrition counseling   Diet review for nutrition referral? Yes ____  Not Indicated _X___   Patient Instructions (the written plan) was given to the patient.  Medicare Attestation I have personally reviewed: The patient's medical and social history Their use of alcohol, tobacco or illicit drugs Their current medications and supplements The patient's functional ability including ADLs,fall risks, home safety risks, cognitive, and hearing and visual impairment Diet and physical activities Evidence for depression or mood disorders  The patient's weight, height, BMI,  have been recorded in the chart.  I have made referrals, counseling, and provided education to the patient based on review of the above and I have provided the patient with a written personalized care plan for preventive services.       Problem List Items Addressed This Visit      Cardiovascular and Mediastinum   Essential hypertension    Blood pressure slightly elevated above goal of 140/90 with current medication regimen and no adverse side effects. Denies worst headache of life with no new symptoms of end organ damage noted on physical exam. Continue current dosage of metoprolol and losartan-hydrochlorothiazide. Encouraged to monitor blood pressure at home and follow low-sodium diet. Continue to monitor.        Endocrine   Type 2 diabetes mellitus (HCC)    Obtain hemoglobin A1c and urine microalbumin. Diabetic foot exam completed today. Pneumovax is up-to-date. Diabetic eye exam is up-to-date. Continue with lifestyle management and Januvia. Follow-up and changes pending A1c results.        Other   Dyslipidemia    Maintained on lifestyle management and fish oil. Obtain lipid profile. Continue current dosage of fish oil pending lipid profile results.      Medicare annual wellness visit, subsequent    Reviewed and updated patient's medical, surgical, family and social history. Medications and allergies were also reviewed. Basic screenings for depression, activities of daily living, hearing, cognition and safety were performed. Provider list was updated and health plan was provided to the patient.       Routine general medical examination at a health care facility - Primary    1) Anticipatory Guidance: Discussed importance of wearing a seatbelt while driving and not texting while driving; changing batteries in smoke detector at least once annually; wearing suntan lotion when outside; eating a balanced and moderate diet; getting physical activity at least 30 minutes per day.  2) Immunizations / Screenings / Labs:  Declines tetanus. All other immunizations are up-to-date per recommendations. Due for diabetic foot exam completed today. Obtain PSA for  prostate cancer screening. Obtain hemoglobin A1c and urine  microalbumin. All other screenings are up-to-date per recommendations. Obtain CBC, CMET, and lipid profile.    Overall well exam with risk factors for cardiovascular disease including hypertension, diabetes, and dyslipidemia. Blood pressure slightly elevated above goal with other chronic conditions appearing well controlled. Continue healthy lifestyle behaviors and choices. Follow-up prevention exam in 1 year. Follow-up office visit pending blood work and for chronic conditions. Note he is also managed through the [employer].      Relevant Orders   CBC   Comprehensive metabolic panel   Lipid panel   PSA   Hemoglobin A1c   Urine Microalbumin w/creat. ratio       I have discontinued Mr. Behrend's predniSONE and azithromycin. I am also having him maintain his aspirin, diphenhydramine-acetaminophen, Biotin, Omega-3 Fatty Acids (FISH OIL PO), sitaGLIPtin, ONE TOUCH ULTRA MINI, glucose blood, onetouch ultrasoft, losartan-hydrochlorothiazide, ONETOUCH DELICA LANCETS 56D, metoprolol succinate, and allopurinol.   No orders of the defined types were placed in this encounter.    Follow-up: Return in about 6 months (around 07/24/2017), or if symptoms worsen or fail to improve.   Mauricio Po, FNP

## 2017-01-24 NOTE — Patient Instructions (Addendum)
Thank you for choosing [facility].  SUMMARY AND INSTRUCTIONS:  Medication:  Please continue to take your medications as prescribed.   Labs:  Please stop by the lab on the lower level of the building for your blood work. Your results will be released to [place] (or called to you) after review, usually within 72 hours after test completion. If any changes need to be made, you will be notified at that same time.  1.) The lab is open from 7:30am to 5:30 pm Monday-Friday 2.) No appointment is necessary 3.) Fasting (if needed) is 6-8 hours after food and drink; black coffee and water are okay   Follow up:  If your symptoms worsen or fail to improve, please contact our office for further instruction, or in case of emergency go directly to the emergency room at the closest medical facility.    Health Maintenance  Topic Date Due  . FOOT EXAM  10/21/1937  . TETANUS/TDAP  10/21/1946  . HEMOGLOBIN A1C  07/21/2016  . OPHTHALMOLOGY EXAM  01/16/2018  . INFLUENZA VACCINE  Completed  . PNA vac Low Risk Adult  Completed     Health Maintenance, Male A healthy lifestyle and preventive care is important for your health and wellness. Ask your health care provider about what schedule of regular examinations is right for you. What should I know about weight and diet?  Eat a Healthy Diet  Eat plenty of vegetables, fruits, whole grains, low-fat dairy products, and lean protein.  Do not eat a lot of foods high in solid fats, added sugars, or salt. Maintain a Healthy Weight  Regular exercise can help you achieve or maintain a healthy weight. You should:  Do at least 150 minutes of exercise each week. The exercise should increase your heart rate and make you sweat (moderate-intensity exercise).  Do strength-training exercises at least twice a week. Watch Your Levels of Cholesterol and Blood Lipids  Have your blood tested for lipids and cholesterol every 5 years starting at 81 years of age.  If you are at high risk for heart disease, you should start having your blood tested when you are 81 years old. You may need to have your cholesterol levels checked more often if:  Your lipid or cholesterol levels are high.  You are older than 81 years of age.  You are at high risk for heart disease. What should I know about cancer screening? Many types of cancers can be detected early and may often be prevented. Lung Cancer  You should be screened every year for lung cancer if:  You are a current smoker who has smoked for at least 30 years.  You are a former smoker who has quit within the past 15 years.  Talk to your health care provider about your screening options, when you should start screening, and how often you should be screened. Colorectal Cancer  Routine colorectal cancer screening usually begins at 81 years of age and should be repeated every 5-10 years until you are 81 years old. You may need to be screened more often if early forms of precancerous polyps or small growths are found. Your health care provider may recommend screening at an earlier age if you have risk factors for colon cancer.  Your health care provider may recommend using home test kits to check for hidden blood in the stool.  A small camera at the end of a tube can be used to examine your colon (sigmoidoscopy or colonoscopy). This checks for the  earliest forms of colorectal cancer. Prostate and Testicular Cancer  Depending on your age and overall health, your health care provider may do certain tests to screen for prostate and testicular cancer.  Talk to your health care provider about any symptoms or concerns you have about testicular or prostate cancer. Skin Cancer  Check your skin from head to toe regularly.  Tell your health care provider about any new moles or changes in moles, especially if:  There is a change in a mole's size, shape, or color.  You have a mole that is larger than a pencil  eraser.  Always use sunscreen. Apply sunscreen liberally and repeat throughout the day.  Protect yourself by wearing long sleeves, pants, a wide-brimmed hat, and sunglasses when outside. What should I know about heart disease, diabetes, and high blood pressure?  If you are 31-34 years of age, have your blood pressure checked every 3-5 years. If you are 73 years of age or older, have your blood pressure checked every year. You should have your blood pressure measured twice-once when you are at a hospital or clinic, and once when you are not at a hospital or clinic. Record the average of the two measurements. To check your blood pressure when you are not at a hospital or clinic, you can use:  An automated blood pressure machine at a pharmacy.  A home blood pressure monitor.  Talk to your health care provider about your target blood pressure.  If you are between 90-45 years old, ask your health care provider if you should take aspirin to prevent heart disease.  Have regular diabetes screenings by checking your fasting blood sugar level.  If you are at a normal weight and have a low risk for diabetes, have this test once every three years after the age of 62.  If you are overweight and have a high risk for diabetes, consider being tested at a younger age or more often.  A one-time screening for abdominal aortic aneurysm (AAA) by ultrasound is recommended for men aged 51-75 years who are current or former smokers. What should I know about preventing infection? Hepatitis B  If you have a higher risk for hepatitis B, you should be screened for this virus. Talk with your health care provider to find out if you are at risk for hepatitis B infection. Hepatitis C  Blood testing is recommended for:  Everyone born from [birth year] through 1963.  Anyone with known risk factors for hepatitis C. Sexually Transmitted Diseases (STDs)  You should be screened each year for STDs including gonorrhea and  chlamydia if:  You are sexually active and are younger than 81 years of age.  You are older than 81 years of age and your health care provider tells you that you are at risk for this type of infection.  Your sexual activity has changed since you were last screened and you are at an increased risk for chlamydia or gonorrhea. Ask your health care provider if you are at risk.  Talk with your health care provider about whether you are at high risk of being infected with HIV. Your health care provider may recommend a prescription medicine to help prevent HIV infection. What else can I do?  Schedule regular health, dental, and eye exams.  Stay current with your vaccines (immunizations).  Do not use any tobacco products, such as cigarettes, chewing tobacco, and e-cigarettes. If you need help quitting, ask your health care provider.  Limit alcohol intake to no  more than 2 drinks per day. One drink equals 12 ounces of beer, 5 ounces of wine, or 1 ounces of hard liquor.  Do not use street drugs.  Do not share needles.  Ask your health care provider for help if you need support or information about quitting drugs.  Tell your health care provider if you often feel depressed.  Tell your health care provider if you have ever been abused or do not feel safe at home. This information is not intended to replace advice given to you by your health care provider. Make sure you discuss any questions you have with your health care provider. Document Released: 05/12/2008 Document Revised: 07/13/2016 Document Reviewed: 08/18/2015 Elsevier Interactive Patient Education  2017 [employer].

## 2017-01-24 NOTE — Assessment & Plan Note (Signed)
Blood pressure slightly elevated above goal of 140/90 with current medication regimen and no adverse side effects. Denies worst headache of life with no new symptoms of end organ damage noted on physical exam. Continue current dosage of metoprolol and losartan-hydrochlorothiazide. Encouraged to monitor blood pressure at home and follow low-sodium diet. Continue to monitor.

## 2017-01-27 ENCOUNTER — Other Ambulatory Visit (HOSPITAL_COMMUNITY): Payer: Self-pay | Admitting: *Deleted

## 2017-01-30 ENCOUNTER — Encounter (HOSPITAL_COMMUNITY)
Admission: RE | Admit: 2017-01-30 | Discharge: 2017-01-30 | Disposition: A | Discharge status: Home / Self Care | Payer: Medicare Other | Source: Ambulatory Visit | Attending: Nephrology | Admitting: Nephrology

## 2017-01-30 DIAGNOSIS — D631 Anemia in chronic kidney disease: Secondary | ICD-10-CM | POA: Insufficient documentation

## 2017-01-30 DIAGNOSIS — D638 Anemia in other chronic diseases classified elsewhere: Secondary | ICD-10-CM

## 2017-01-30 DIAGNOSIS — N183 Chronic kidney disease, stage 3 (moderate): Secondary | ICD-10-CM | POA: Insufficient documentation

## 2017-01-30 LAB — RENAL FUNCTION PANEL
ANION GAP: 10 (ref 5–15)
Albumin: 3.5 g/dL (ref 3.5–5.0)
BUN: 40 mg/dL — ABNORMAL HIGH (ref 6–20)
CO2: 23 mmol/L (ref 22–32)
Calcium: 9 mg/dL (ref 8.9–10.3)
Chloride: 103 mmol/L (ref 101–111)
Creatinine, Ser: 2.29 mg/dL — ABNORMAL HIGH (ref 0.61–1.24)
GFR calc non Af Amer: 24 mL/min — ABNORMAL LOW (ref >60)
GFR, EST AFRICAN AMERICAN: 27 mL/min — AB (ref >60)
GLUCOSE: 148 mg/dL — AB (ref 65–99)
POTASSIUM: 4.7 mmol/L (ref 3.5–5.1)
Phosphorus: 4 mg/dL (ref 2.5–4.6)
SODIUM: 136 mmol/L (ref 135–145)

## 2017-01-30 LAB — IRON AND TIBC
IRON: 96 ug/dL (ref 45–182)
SATURATION RATIOS: 34 % (ref 17.9–39.5)
TIBC: 283 ug/dL (ref 250–450)
UIBC: 187 ug/dL

## 2017-01-30 LAB — POCT HEMOGLOBIN-HEMACUE: Hemoglobin: 10.6 g/dL — ABNORMAL LOW (ref 13.0–17.0)

## 2017-01-30 LAB — FERRITIN: Ferritin: 258 ng/mL (ref 24–336)

## 2017-01-30 MED ORDER — EPOETIN ALFA 20000 UNIT/ML IJ SOLN
INTRAMUSCULAR | Status: AC
  Administered 2017-01-30: 09:00:00 20000 [IU]
  Filled 2017-01-30: qty 1

## 2017-01-30 MED ORDER — EPOETIN ALFA 10000 UNIT/ML IJ SOLN: 20000.0000 [IU] | INTRAMUSCULAR | Status: DC

## 2017-02-08 ENCOUNTER — Other Ambulatory Visit (INDEPENDENT_AMBULATORY_CARE_PROVIDER_SITE_OTHER): Payer: Medicare Other

## 2017-02-08 DIAGNOSIS — Z125 Encounter for screening for malignant neoplasm of prostate: Secondary | ICD-10-CM | POA: Diagnosis not present

## 2017-02-08 DIAGNOSIS — Z0000 Encounter for general adult medical examination without abnormal findings: Secondary | ICD-10-CM | POA: Diagnosis not present

## 2017-02-08 LAB — LIPID PANEL
CHOL/HDL RATIO: 3
Cholesterol: 195 mg/dL (ref 0–200)
HDL: 74 mg/dL (ref >39.00)
LDL CALC: 90 mg/dL (ref 0–99)
NONHDL: 121.29
TRIGLYCERIDES: 154 mg/dL — AB (ref 0.0–149.0)
VLDL: 30.8 mg/dL (ref 0.0–40.0)

## 2017-02-08 LAB — CBC
HCT: 35.7 % — ABNORMAL LOW (ref 39.0–52.0)
Hemoglobin: 11.8 g/dL — ABNORMAL LOW (ref 13.0–17.0)
MCHC: 33.2 g/dL (ref 30.0–36.0)
MCV: 95.2 fl (ref 78.0–100.0)
PLATELETS: 181 10*3/uL (ref 150.0–400.0)
RBC: 3.75 Mil/uL — ABNORMAL LOW (ref 4.22–5.81)
RDW: 17.6 % — AB (ref 11.5–15.5)
WBC: 7.2 10*3/uL (ref 4.0–10.5)

## 2017-02-08 LAB — COMPREHENSIVE METABOLIC PANEL
ALT: 34 U/L (ref 0–53)
AST: 34 U/L (ref 0–37)
Albumin: 4 g/dL (ref 3.5–5.2)
Alkaline Phosphatase: 163 U/L — ABNORMAL HIGH (ref 39–117)
BILIRUBIN TOTAL: 0.8 mg/dL (ref 0.2–1.2)
BUN: 32 mg/dL — ABNORMAL HIGH (ref 6–23)
CALCIUM: 9.4 mg/dL (ref 8.4–10.5)
CHLORIDE: 104 meq/L (ref 96–112)
CO2: 25 meq/L (ref 19–32)
CREATININE: 2.18 mg/dL — AB (ref 0.40–1.50)
GFR: 30.4 mL/min — ABNORMAL LOW (ref >60.00)
GLUCOSE: 164 mg/dL — AB (ref 70–99)
Potassium: 4.8 mEq/L (ref 3.5–5.1)
Sodium: 140 mEq/L (ref 135–145)
Total Protein: 7.3 g/dL (ref 6.0–8.3)

## 2017-02-08 LAB — MICROALBUMIN / CREATININE URINE RATIO
Creatinine,U: 94.9 mg/dL
Microalb Creat Ratio: 66.8 mg/g — ABNORMAL HIGH (ref 0.0–30.0)
Microalb, Ur: 63.4 mg/dL — ABNORMAL HIGH (ref 0.0–1.9)

## 2017-02-08 LAB — HEMOGLOBIN A1C: Hgb A1c MFr Bld: 6.8 % — ABNORMAL HIGH (ref 4.6–6.5)

## 2017-02-08 LAB — PSA: PSA: 0.01 ng/mL — ABNORMAL LOW (ref 0.10–4.00)

## 2017-02-27 ENCOUNTER — Encounter (HOSPITAL_COMMUNITY): Payer: Medicare Other

## 2017-02-28 ENCOUNTER — Encounter: Payer: Self-pay | Admitting: Cardiovascular Disease

## 2017-03-09 ENCOUNTER — Other Ambulatory Visit: Payer: Self-pay | Admitting: Nurse Practitioner

## 2017-03-15 ENCOUNTER — Encounter (INDEPENDENT_AMBULATORY_CARE_PROVIDER_SITE_OTHER): Payer: Self-pay

## 2017-03-15 ENCOUNTER — Encounter: Payer: Self-pay | Admitting: Cardiovascular Disease

## 2017-03-15 ENCOUNTER — Other Ambulatory Visit: Payer: Self-pay | Admitting: Family

## 2017-03-15 ENCOUNTER — Ambulatory Visit (INDEPENDENT_AMBULATORY_CARE_PROVIDER_SITE_OTHER): Payer: Medicare Other | Admitting: Cardiovascular Disease

## 2017-03-15 VITALS — BP 150/64 | HR 63 | Ht 66.0 in | Wt 143.1 lb

## 2017-03-15 DIAGNOSIS — I493 Ventricular premature depolarization: Secondary | ICD-10-CM | POA: Diagnosis not present

## 2017-03-15 DIAGNOSIS — I119 Hypertensive heart disease without heart failure: Secondary | ICD-10-CM

## 2017-03-15 DIAGNOSIS — I10 Essential (primary) hypertension: Secondary | ICD-10-CM | POA: Diagnosis not present

## 2017-03-15 MED ORDER — AMLODIPINE BESYLATE 2.5 MG PO TABS: 2.5000 mg | ORAL_TABLET | Freq: Every day | ORAL | 11 refills | Status: DC

## 2017-03-15 NOTE — Patient Instructions (Signed)
Medication Instructions:  START Amlodipine 2.5 mg once daily   Labwork: None Ordered   Testing/Procedures: None Ordered   Follow-Up: Your physician wants you to follow-up in: 6 months with Dr. Acie Fredrickson.  You will receive a reminder letter in the mail two months in advance. If you don't receive a letter, please call our office to schedule the follow-up appointment.   If you need a refill on your cardiac medications before your next appointment, please call your pharmacy.   Thank you for choosing [facility]! Christen Bame, RN [phone]

## 2017-03-15 NOTE — Progress Notes (Signed)
CARDIOLOGY OFFICE NOTE  Date:  03/15/2017    Gerald Jacobs Date of Birth: 1925-05-30 Medical Record #[id number]  PCP:  Mauricio Po, FNP  Cardiologist:  Former patient of Dr. Sherryl Barters    No chief complaint on file.  Problem List 1. Essential HTN 2. DM 3. CKD - sees [name],   baseline Cr = 1.94 4, Anemia - taking procrit    Notes from Truitt Merle:   Gerald Jacobs is a 83 y.o. male who presents today for a 4 month check. Former patient of Dr. Sherryl Barters.   He has a history of HTN, palpitations, dyslipidemia, diabetes, and mild renal insufficiency. Also has a history of hyperuricemia. He has anemia secondary to chronic renal disease and receives ejections of Procrit about every 3 weeks depending on his hemoglobin level. Dr. Posey Pronto is his nephrologist.   He was last seen back in November and felt to be doing well. Had his Medicare well check last month by PCP.   Comes back today. Here alone. Wife was to be seen also today - but having shoulder issues due to a fall and has cancelled. He is doing ok. No chest pain. He is trying to stay active but notes he has slowed down some. Has stable DOE. No falls. No passing out. Occasional palpitations but nothing that really worries him or produces symptoms. He is pleased with how he is doing. He has a brother who is alive at 35.   Oct. 6, 2017: This is the first time meeting Gerald Jacobs . He is a transfer from [place]. He has a history of hypertension and diabetes Is doing well. Has some shoulder issues Also goes [facility] the [facility]   Plays golf on Thursdays - limited by his shoulder pain  No longer does his yard work  No CP ,  Occasional dyspnea - also has some anemia  BP is a bit high today . Takes his BP at home and the reading are typically quite good.  Retired from [employer] - retired in 1989.    March 15, 2017: Gerald Jacobs is seen back today for follow up of his HTN. BP has been a little high  Not  playing much golf recently .      Past Medical History:  Diagnosis Date  . Cancer [facility])    prostate  . Chronic anemia    With normal iron studies and normal serum protein electrophoresis  . Coronary artery disease    Mild  . Diabetes mellitus    Adult onset  . Essential hypertension   . GERD (gastroesophageal reflux disease)    occ tums  . History of gout   . Mild renal insufficiency   . Nocturia    x2  . Osteoarthritis     Past Surgical History:  Procedure Laterality Date  . CHOLECYSTECTOMY  02/04/2014   DR Zella Richer  . CHOLECYSTECTOMY N/A 02/04/2014   Procedure: LAPAROSCOPIC CHOLECYSTECTOMY with intraoperative cholangiogram;  Surgeon: Odis Hollingshead, MD;  Location: [facility];  Service: General;  Laterality: N/A;  . ERCP N/A 10/15/2013   Procedure: ENDOSCOPIC RETROGRADE CHOLANGIOPANCREATOGRAPHY (ERCP);  Surgeon: Jeryl Columbia, MD;  Location: [facility];  Service: Endoscopy;  Laterality: N/A;  . KNEE SURGERY Right 1979  . melanoma surgery  1970's   on scalp  . PROSTATE SURGERY  1993   Prostate Cancer  . SPHINCTEROTOMY  10/15/2013   Procedure: SPHINCTEROTOMY;  Surgeon: Jeryl Columbia, MD;  Location: [facility];  Service: Endoscopy;;     Medications: Current Outpatient Prescriptions  Medication Sig Dispense Refill  . allopurinol (ZYLOPRIM) 100 MG tablet Take 1 tablet (100 mg total) by mouth daily. 90 tablet 1  . aspirin 81 MG tablet Take 81 mg by mouth daily.      . Biotin 1000 MCG tablet Take 1,000 mcg by mouth daily.    . diphenhydramine-acetaminophen (TYLENOL PM) 25-500 MG TABS Take 1 tablet by mouth at bedtime as needed (sleep).    Marland Kitchen glucose blood test strip Use one strip per test. Check blood sugars 1-4 times per day as instructed. 100 each 12  . losartan-hydrochlorothiazide (HYZAAR) 100-12.5 MG tablet TAKE 1/2 TABLET BY MOUTH DAILY 45 tablet 3  . metoprolol succinate (TOPROL-XL) 50 MG 24 hr tablet Take 1 tablet by mouth every morning.  3  . Omega-3 Fatty Acids  (FISH OIL PO) Take 1 capsule by mouth daily.    . sitaGLIPtin (JANUVIA) 50 MG tablet Take 50 mg by mouth daily.     No current facility-administered medications for this visit.     Allergies: Allergies  Allergen Reactions  . Colchicine Other (See Comments)    GI problems  . Flexeril [Cyclobenzaprine Hcl] Other (See Comments)    GI problems   . Levaquin [Levofloxacin In D5w] Other (See Comments)    insomnia  . Percocet [Oxycodone-Acetaminophen] Nausea And Vomiting  . Zebeta Other (See Comments)    Gi problems    Social History: The patient  reports that he quit smoking about 37 years ago. His smoking use included Cigarettes. He has a 45.00 pack-year smoking history. He has never used smokeless tobacco. He reports that he drinks alcohol. He reports that he does not use drugs.   Family History: The patient's family history includes Stroke in his brother, brother, brother, father, mother, and sister.   Review of Systems: Please see the history of present illness.   Otherwise, the review of systems is positive for none.   All other systems are reviewed and negative.   Physical Exam: VS:  BP (!) 150/64   Pulse 63   Ht 5\' 6"  (1.676 m)   Wt 143 lb 1.9 oz (64.9 kg)   BMI 23.10 kg/m  .  BMI Body mass index is 23.1 kg/m.  Wt Readings from Last 3 Encounters:  03/15/17 143 lb 1.9 oz (64.9 kg)  01/24/17 145 lb 6.4 oz (66 kg)  10/27/16 150 lb (68 kg)    General: Pleasant. Looks younger than his stated age. Well developed, well nourished and in no acute distress.   HEENT: Normal.  Neck: Supple, no JVD, carotid bruits, or masses noted.  Cardiac: Regular rate and rhythm. No murmurs, rubs, or gallops. No edema.  Respiratory:  Lungs are clear to auscultation bilaterally with normal work of breathing.  GI: Soft and nontender.  MS: No deformity or atrophy. Gait and ROM intact.  Skin: Warm and dry. Color is normal.  Neuro:  Strength and sensation are intact and no gross focal deficits  noted.  Psych: Alert, appropriate and with normal affect.   LABORATORY DATA:  EKG:  EKG is ordered today. NSR with sinus arrhythmia  Lab Results  Component Value Date   WBC 7.2 02/08/2017   HGB 11.8 (L) 02/08/2017   HCT 35.7 (L) 02/08/2017   PLT 181.0 02/08/2017   GLUCOSE 164 (H) 02/08/2017   CHOL 195 02/08/2017   TRIG 154.0 (H) 02/08/2017   HDL 74.00 02/08/2017   LDLCALC 90 02/08/2017  ALT 34 02/08/2017   AST 34 02/08/2017   NA 140 02/08/2017   K 4.8 02/08/2017   CL 104 02/08/2017   CREATININE 2.18 (H) 02/08/2017   BUN 32 (H) 02/08/2017   CO2 25 02/08/2017   TSH 2.54 01/22/2016   PSA 0.01 (L) 02/08/2017   INR 1.12 01/28/2014   HGBA1C 6.8 (H) 02/08/2017   MICROALBUR 63.4 (H) 02/08/2017    BNP (last 3 results) No results for input(s): BNP in the last 8760 hours.  ProBNP (last 3 results) No results for input(s): PROBNP in the last 8760 hours.   Other Studies Reviewed Today:   Assessment/Plan: 1. 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  Will add amlodipine 2.5  Mg a day . Will see him in 6 months    2. History of palpitations secondary to bigeminy PVCs - stable on current regimen.  3. CKD - followed by nephrology  4. DM - followed by PCP - last AIC was 6.3  5. History of gout - on allopurinol.  6. Hypercholesterolemia - recent labs reviewed. He remains on statin therapy. No changes made.   Current medicines are reviewed with the patient today.  The patient does not have concerns regarding medicines other than what has been noted above.  The following changes have been made:  See above.  Labs/ tests ordered today include:   No orders of the defined types were placed in this encounter.   Mertie Moores, MD  03/15/2017 8:35 AM    [address],  [place], [place]  [address] Pager [phone] Phone: [phone]; Fax: [phone]

## 2017-03-22 ENCOUNTER — Other Ambulatory Visit: Payer: Self-pay | Admitting: Family

## 2017-03-29 ENCOUNTER — Other Ambulatory Visit: Payer: Self-pay

## 2017-03-29 MED ORDER — ONETOUCH DELICA LANCETS 33G MISC: 2 refills | Status: DC

## 2017-05-09 ENCOUNTER — Other Ambulatory Visit (INDEPENDENT_AMBULATORY_CARE_PROVIDER_SITE_OTHER): Payer: Medicare Other

## 2017-05-09 ENCOUNTER — Encounter: Payer: Self-pay | Admitting: Family

## 2017-05-09 ENCOUNTER — Ambulatory Visit (INDEPENDENT_AMBULATORY_CARE_PROVIDER_SITE_OTHER): Payer: Medicare Other | Admitting: Family

## 2017-05-09 VITALS — BP 140/62 | HR 62 | Temp 98.2°F | Resp 16 | Ht 66.0 in | Wt 142.1 lb

## 2017-05-09 DIAGNOSIS — N183 Chronic kidney disease, stage 3 unspecified: Secondary | ICD-10-CM

## 2017-05-09 DIAGNOSIS — E119 Type 2 diabetes mellitus without complications: Secondary | ICD-10-CM | POA: Diagnosis not present

## 2017-05-09 LAB — CBC
HEMATOCRIT: 27.9 % — AB (ref 39.0–52.0)
HEMOGLOBIN: 9.5 g/dL — AB (ref 13.0–17.0)
MCHC: 33.9 g/dL (ref 30.0–36.0)
MCV: 95.9 fl (ref 78.0–100.0)
Platelets: 138 10*3/uL — ABNORMAL LOW (ref 150.0–400.0)
RBC: 2.91 Mil/uL — ABNORMAL LOW (ref 4.22–5.81)
RDW: 14.7 % (ref 11.5–15.5)
WBC: 6.9 10*3/uL (ref 4.0–10.5)

## 2017-05-09 LAB — POCT GLYCOSYLATED HEMOGLOBIN (HGB A1C): Hemoglobin A1C: 5.8

## 2017-05-09 NOTE — Assessment & Plan Note (Signed)
In office A1c of 5.8 indicating exceptional control of type 2 diabetes with current medication regimen and no adverse side effects. Continue current dosage of saxagliptin. Monitor blood sugars at home 1-2 times per week. Continue to follow a modified carbohydrate intake. Pneumovax is up to date. Diabetic eye and foot exams are up to date. Follow up in 6 months or sooner if needed.

## 2017-05-09 NOTE — Patient Instructions (Addendum)
Thank you for choosing [facility].  SUMMARY AND INSTRUCTIONS:  Please continue to take your medication as prescribed.   Follow up with nephrology as scheduled.   Continue to follow a modified carbohydrate diet.   Follow up in 6 months or sooner.   Follow up:  If your symptoms worsen or fail to improve, please contact our office for further instruction, or in case of emergency go directly to the emergency room at the closest medical facility.

## 2017-05-09 NOTE — Assessment & Plan Note (Addendum)
Stable and followed by nephology. Previous noted anemia, most likely associated with renal insufficiency. Recheck CBC. Continue to monitor.

## 2017-05-09 NOTE — Progress Notes (Signed)
Subjective:    Patient ID: Gerald Jacobs, male    DOB: [birth year], 83 y.o.   MRN: [id number]  Chief Complaint  Patient presents with  . Follow-up    diabetes check    HPI:  Gerald Jacobs is a 83 y.o. male who  has a past medical history of Cancer ([place]); Chronic anemia; Coronary artery disease; Diabetes mellitus; Essential hypertension; GERD (gastroesophageal reflux disease); History of gout; Mild renal insufficiency; Nocturia; and Osteoarthritis. and presents today for a follow up office visit.   Diabetes - Currently maintained on saxagliptin. Reports taking the medication as prescribed and denies adverse side effects or hypoglycemic readings. Blood sugars at home have been within goal range Denies new symptoms of end organ damage. No excessive hunger, thirst, or urination. Working on a low/carbohydrate modified oral intake.   Lab Results  Component Value Date   HGBA1C 5.8 05/09/2017     Lab Results  Component Value Date   CREATININE 2.18 (H) 02/08/2017   BUN 32 (H) 02/08/2017   NA 140 02/08/2017   K 4.8 02/08/2017   CL 104 02/08/2017   CO2 25 02/08/2017      Allergies  Allergen Reactions  . Colchicine Other (See Comments)    GI problems  . Flexeril [Cyclobenzaprine Hcl] Other (See Comments)    GI problems   . Levaquin [Levofloxacin In D5w] Other (See Comments)    insomnia  . Percocet [Oxycodone-Acetaminophen] Nausea And Vomiting  . Zebeta Other (See Comments)    Gi problems      Outpatient Medications Prior to Visit  Medication Sig Dispense Refill  . allopurinol (ZYLOPRIM) 100 MG tablet TAKE 1 TABLET (100 MG TOTAL) BY MOUTH DAILY. 90 tablet 1  . amLODipine (NORVASC) 2.5 MG tablet Take 1 tablet (2.5 mg total) by mouth daily. 30 tablet 11  . aspirin 81 MG tablet Take 81 mg by mouth daily.      . Biotin 1000 MCG tablet Take 1,000 mcg by mouth daily.    . diphenhydramine-acetaminophen (TYLENOL PM) 25-500 MG TABS Take 1 tablet by mouth at bedtime as needed  (sleep).    Marland Kitchen glucose blood test strip Use one strip per test. Check blood sugars 1-4 times per day as instructed. 100 each 12  . losartan-hydrochlorothiazide (HYZAAR) 100-12.5 MG tablet TAKE 1/2 TABLET BY MOUTH DAILY 45 tablet 3  . metoprolol succinate (TOPROL-XL) 50 MG 24 hr tablet Take 1 tablet by mouth every morning.  3  . Omega-3 Fatty Acids (FISH OIL PO) Take 1 capsule by mouth daily.    Glory Rosebush DELICA LANCETS 76H MISC TEST BLOOD SUGARS 1-4 TIMES A DAY AS INSTRUCTED. DX CODE: E11.9 100 each 2  . sitaGLIPtin (JANUVIA) 50 MG tablet Take 50 mg by mouth daily.     No facility-administered medications prior to visit.       Review of Systems  Eyes:       Negative for changes in vision.  Respiratory: Negative for chest tightness and shortness of breath.   Cardiovascular: Negative for chest pain, palpitations and leg swelling.  Endocrine: Negative for polydipsia, polyphagia and polyuria.  Neurological: Negative for dizziness, weakness, light-headedness and headaches.      Objective:    BP 140/62 (BP Location: Left Arm, Patient Position: Sitting, Cuff Size: Normal)   Pulse 62   Temp 98.2 F (36.8 C) (Oral)   Resp 16   Ht 5\' 6"  (1.676 m)   Wt 142 lb 1.9 oz (64.5 kg)  SpO2 99%   BMI 22.94 kg/m  Nursing note and vital signs reviewed.  Physical Exam  Constitutional: He is oriented to person, place, and time. He appears well-developed and well-nourished. No distress.  Cardiovascular: Normal rate, regular rhythm, normal heart sounds and intact distal pulses.   Pulmonary/Chest: Effort normal and breath sounds normal.  Neurological: He is alert and oriented to person, place, and time.  Skin: Skin is warm and dry.  Psychiatric: He has a normal mood and affect. His behavior is normal. Judgment and thought content normal.       Assessment & Plan:   Problem List Items Addressed This Visit      Endocrine   Type 2 diabetes mellitus ([place]) - Primary    In office A1c of 5.8  indicating exceptional control of type 2 diabetes with current medication regimen and no adverse side effects. Continue current dosage of saxagliptin. Monitor blood sugars at home 1-2 times per week. Continue to follow a modified carbohydrate intake. Pneumovax is up to date. Diabetic eye and foot exams are up to date. Follow up in 6 months or sooner if needed.       Relevant Medications   saxagliptin HCl (ONGLYZA) 2.5 MG TABS tablet   Other Relevant Orders   POCT HgB A1C (Completed)     Genitourinary   Chronic renal insufficiency, stage III (moderate)    Stable and followed by nephology.       Relevant Orders   CBC (Completed)       I have discontinued Mr. Koors's sitaGLIPtin. I am also having him maintain his aspirin, diphenhydramine-acetaminophen, Biotin, Omega-3 Fatty Acids (FISH OIL PO), glucose blood, losartan-hydrochlorothiazide, allopurinol, metoprolol succinate, amLODipine, ONETOUCH DELICA LANCETS 15X, saxagliptin HCl, and L-Lysine.   Meds ordered this encounter  Medications  . saxagliptin HCl (ONGLYZA) 2.5 MG TABS tablet    Sig: Take 2.5 mg by mouth daily.  Marland Kitchen L-Lysine 500 MG CAPS    Sig: Take 500 mg by mouth 2 (two) times daily.     Follow-up: Return in about 6 months (around 11/08/2017), or if symptoms worsen or fail to improve.  Mauricio Po, FNP

## 2017-05-10 ENCOUNTER — Other Ambulatory Visit: Payer: Self-pay | Admitting: Family

## 2017-05-10 DIAGNOSIS — D649 Anemia, unspecified: Secondary | ICD-10-CM

## 2017-05-22 ENCOUNTER — Other Ambulatory Visit (INDEPENDENT_AMBULATORY_CARE_PROVIDER_SITE_OTHER): Payer: Medicare Other

## 2017-05-22 DIAGNOSIS — D649 Anemia, unspecified: Secondary | ICD-10-CM | POA: Diagnosis not present

## 2017-05-22 LAB — CBC
HCT: 26.5 % — ABNORMAL LOW (ref 39.0–52.0)
Hemoglobin: 9.1 g/dL — ABNORMAL LOW (ref 13.0–17.0)
MCHC: 34.3 g/dL (ref 30.0–36.0)
MCV: 95.9 fl (ref 78.0–100.0)
PLATELETS: 133 10*3/uL — AB (ref 150.0–400.0)
RBC: 2.77 Mil/uL — ABNORMAL LOW (ref 4.22–5.81)
RDW: 15.2 % (ref 11.5–15.5)
WBC: 7 10*3/uL (ref 4.0–10.5)

## 2017-05-24 ENCOUNTER — Other Ambulatory Visit: Payer: Self-pay | Admitting: Family

## 2017-05-24 DIAGNOSIS — D649 Anemia, unspecified: Secondary | ICD-10-CM

## 2017-05-25 ENCOUNTER — Telehealth: Payer: Self-pay | Admitting: Family

## 2017-05-30 ENCOUNTER — Other Ambulatory Visit (INDEPENDENT_AMBULATORY_CARE_PROVIDER_SITE_OTHER): Payer: Medicare Other

## 2017-05-30 ENCOUNTER — Telehealth: Payer: Self-pay

## 2017-05-30 DIAGNOSIS — R195 Other fecal abnormalities: Secondary | ICD-10-CM

## 2017-05-30 DIAGNOSIS — D649 Anemia, unspecified: Secondary | ICD-10-CM

## 2017-05-30 LAB — FECAL OCCULT BLOOD, IMMUNOCHEMICAL: FECAL OCCULT BLD: POSITIVE — AB

## 2017-05-30 NOTE — Telephone Encounter (Signed)
Received call from lab pt has a positive IFOB. Please advise on what to do next.

## 2017-05-30 NOTE — Telephone Encounter (Signed)
Please inform patient that his stool was positive for blood and then urgent referral to gastroenterology has been placed with concern for a potential gastrointestinal bleed. If he experiences symptoms of weakness, dizziness, or notable bleeding please go directly to the emergency room prior to the gastroenterology referral. Please avoid ibuprofen, Aleve, aspirin, and alcohol.

## 2017-05-30 NOTE — Telephone Encounter (Addendum)
Pt aware of response below.  

## 2017-06-01 ENCOUNTER — Encounter: Payer: Self-pay | Admitting: Physician Assistant

## 2017-06-06 ENCOUNTER — Ambulatory Visit (INDEPENDENT_AMBULATORY_CARE_PROVIDER_SITE_OTHER): Payer: Medicare Other | Admitting: Physician Assistant

## 2017-06-06 ENCOUNTER — Encounter: Payer: Self-pay | Admitting: Physician Assistant

## 2017-06-06 VITALS — BP 136/62 | HR 60 | Ht 66.0 in | Wt 142.4 lb

## 2017-06-06 DIAGNOSIS — R195 Other fecal abnormalities: Secondary | ICD-10-CM | POA: Diagnosis not present

## 2017-06-06 DIAGNOSIS — D649 Anemia, unspecified: Secondary | ICD-10-CM | POA: Diagnosis not present

## 2017-06-06 DIAGNOSIS — R110 Nausea: Secondary | ICD-10-CM | POA: Diagnosis not present

## 2017-06-06 MED ORDER — NA SULFATE-K SULFATE-MG SULF 17.5-3.13-1.6 GM/177ML PO SOLN: 1.0000 | Freq: Once | ORAL | 0 refills | Status: AC

## 2017-06-06 NOTE — Progress Notes (Signed)
Plans for colonoscopy and upper endoscopy to evaluate progressive anemia, Hemoccult-positive stool, and GI complaints as listed

## 2017-06-06 NOTE — Progress Notes (Signed)
Chief Complaint: + Hemoccult, Anemia, Nausea  HPI:  [name] is an 83 year old Caucasian male with a past medical history of chronic anemia with normal iron studies and normal serum protein electrophoresis, coronary artery disease, diabetes, GERD, mild renal insufficiency and others listed below,  who was referred to me by Golden Circle, FNP for a complaint of anemia and Hemoccult-positive stools .     Per review of chart patient recently had fecal occult blood positive in his stool on 05/30/17. Most recent CBC shows a hemoglobin of 9.1 on 05/22/17, decrease from 9.5 2 weeks prior to that and 11.8 3 months ago. Platelets were also low at 133 and red blood cells low at 2.77.   Today, the patient tells me that he has a history of anemia and in fact has been on Procrit in the past as prescribed by his nephrologist. Apparently this was stopped last year when this became too expensive for the patient. He then saw a nephrologist through the [place] and was started on iron infusions in December of last year. He has not had anything done for his anemia this year. Patient explains that he has not had an EGD or colonoscopy in the most recent years, in fact is been at least 10 or 15 years since these were done. He does not remember any irregularities when they were done. Patient tells me that in general he feels "like himself". He does describe a decrease in his appetite and nausea which does worry him some. Patient tells me that the nausea occurs "off and on", and some foods make this worse than others. He also describes 3-4 episodes of heartburn per week for which he takes Tums to relieve his symptoms.   Patient denies fever, chills, blood in his stool, melena, weight loss, change in bowel habits, vomiting or abdominal pain.  Past Medical History:  Diagnosis Date  . Cancer [facility])    prostate  . Chronic anemia    With normal iron studies and normal serum protein electrophoresis  . Coronary artery disease    Mild  . Diabetes mellitus    Adult onset  . Essential hypertension   . GERD (gastroesophageal reflux disease)    occ tums  . History of gout   . Mild renal insufficiency   . Nocturia    x2  . Osteoarthritis     Past Surgical History:  Procedure Laterality Date  . CHOLECYSTECTOMY  02/04/2014   DR Zella Richer  . CHOLECYSTECTOMY N/A 02/04/2014   Procedure: LAPAROSCOPIC CHOLECYSTECTOMY with intraoperative cholangiogram;  Surgeon: Odis Hollingshead, MD;  Location: [place];  Service: General;  Laterality: N/A;  . ERCP N/A 10/15/2013   Procedure: ENDOSCOPIC RETROGRADE CHOLANGIOPANCREATOGRAPHY (ERCP);  Surgeon: Jeryl Columbia, MD;  Location: [facility];  Service: Endoscopy;  Laterality: N/A;  . KNEE SURGERY Right 1979  . melanoma surgery  1970's   on scalp  . PROSTATE SURGERY  1993   Prostate Cancer  . SPHINCTEROTOMY  10/15/2013   Procedure: SPHINCTEROTOMY;  Surgeon: Jeryl Columbia, MD;  Location: [facility];  Service: Endoscopy;;    Current Outpatient Prescriptions  Medication Sig Dispense Refill  . allopurinol (ZYLOPRIM) 100 MG tablet TAKE 1 TABLET (100 MG TOTAL) BY MOUTH DAILY. 90 tablet 1  . amLODipine (NORVASC) 2.5 MG tablet Take 1 tablet (2.5 mg total) by mouth daily. 30 tablet 11  . aspirin 81 MG tablet Take 81 mg by mouth daily.      . Biotin 1000 MCG tablet  Take 1,000 mcg by mouth daily.    . diphenhydramine-acetaminophen (TYLENOL PM) 25-500 MG TABS Take 1 tablet by mouth at bedtime as needed (sleep).    Marland Kitchen glucose blood test strip Use one strip per test. Check blood sugars 1-4 times per day as instructed. 100 each 12  . L-Lysine 500 MG CAPS Take 500 mg by mouth 2 (two) times daily.    Marland Kitchen losartan-hydrochlorothiazide (HYZAAR) 100-12.5 MG tablet TAKE 1/2 TABLET BY MOUTH DAILY 45 tablet 3  . metoprolol succinate (TOPROL-XL) 50 MG 24 hr tablet Take 1 tablet by mouth every morning.  3  . Omega-3 Fatty Acids (FISH OIL PO) Take 1 capsule by mouth daily.    Glory Rosebush DELICA LANCETS 16B  MISC TEST BLOOD SUGARS 1-4 TIMES A DAY AS INSTRUCTED. DX CODE: E11.9 100 each 2  . saxagliptin HCl (ONGLYZA) 2.5 MG TABS tablet Take 2.5 mg by mouth daily.    . Na Sulfate-K Sulfate-Mg Sulf 17.5-3.13-1.6 GM/180ML SOLN Take 1 kit by mouth once. 354 mL 0   No current facility-administered medications for this visit.     Allergies as of 06/06/2017 - Review Complete 06/06/2017  Allergen Reaction Noted  . Colchicine Other (See Comments) 01/18/2012  . Flexeril [cyclobenzaprine hcl] Other (See Comments) 09/06/2011  . Levaquin [levofloxacin in d5w] Other (See Comments) 10/28/2016  . Percocet [oxycodone-acetaminophen] Nausea And Vomiting 01/16/2014  . Zebeta Other (See Comments) 09/06/2011    Family History  Problem Relation Age of Onset  . Stroke Father   . Stroke Mother   . Stroke Sister   . Stroke Brother   . Stroke Brother   . Stroke Brother     Social History   Social History  . Marital status: Married    Spouse name: N/A  . Number of children: 2  . Years of education: 14   Occupational History  . Not on file.   Social History Main Topics  . Smoking status: Former Smoker    Packs/day: 1.00    Years: 45.00    Types: Cigarettes    Quit date: 09/07/1979  . Smokeless tobacco: Never Used  . Alcohol use Yes     Comment: beer rarely  . Drug use: No  . Sexual activity: Not on file   Other Topics Concern  . Not on file   Social History Narrative   Fun/Hobby: Playing golf when he can.     Review of Systems:    Constitutional: No weight loss, fever or chills Skin: No rash  Cardiovascular: No chest pain Respiratory: No SOB  Gastrointestinal: See HPI and otherwise negative Genitourinary: No dysuria Neurological: No headache Musculoskeletal: No new muscle or joint pain Hematologic: No bleeding or bruising Psychiatric: No history of depression or anxiety   Physical Exam:  Vital signs: BP 136/62   Pulse 60   Ht _0  (1.676 m)   Wt 142 lb 6 oz (64.6 kg)   BMI  22.98 kg/m   Constitutional:   Pleasant Elderly Caucasian male appears to be in NAD, Well developed, Well nourished, alert and cooperative Head:  Normocephalic and atraumatic. Eyes:   PEERL, EOMI. No icterus. Conjunctiva pink. Ears:  Normal auditory acuity. Neck:  Supple Throat: Oral cavity and pharynx without inflammation, swelling or lesion.  Respiratory: Respirations even and unlabored. Lungs clear to auscultation bilaterally.   No wheezes, crackles, or rhonchi.  Cardiovascular: Normal S1, S2. No MRG. Regular rate and rhythm. No peripheral edema, cyanosis or pallor.  Gastrointestinal:  Soft, nondistended, nontender.  No rebound or guarding. Normal bowel sounds. No appreciable masses or hepatomegaly. Rectal:  Not performed.  Msk:  Symmetrical without gross deformities. Without edema, no deformity or joint abnormality.  Neurologic:  Alert and  oriented x4;  grossly normal neurologically.  Skin:   Dry and intact without significant lesions or rashes. Psychiatric: Demonstrates good judgement and reason without abnormal affect or behaviors.  MOST RECEBT LABS: CBC    Component Value Date/Time   WBC 7.0 05/22/2017 1518   RBC 2.77 (L) 05/22/2017 1518   HGB 9.1 (L) 05/22/2017 1518   HCT 26.5 Repeated and verified X2. (L) 05/22/2017 1518   PLT 133.0 (L) 05/22/2017 1518   MCV 95.9 05/22/2017 1518   MCV 94.1 05/20/2013 1423   MCH 32.3 01/28/2014 1035   MCHC 34.3 05/22/2017 1518   RDW 15.2 05/22/2017 1518   LYMPHSABS 2.4 01/28/2014 1035   MONOABS 0.6 01/28/2014 1035   EOSABS 0.4 01/28/2014 1035   BASOSABS 0.0 01/28/2014 1035    CMP     Component Value Date/Time   NA 140 02/08/2017 0827   K 4.8 02/08/2017 0827   CL 104 02/08/2017 0827   CO2 25 02/08/2017 0827   GLUCOSE 164 (H) 02/08/2017 0827   BUN 32 (H) 02/08/2017 0827   CREATININE 2.18 (H) 02/08/2017 0827   CREATININE 2.11 (H) 05/20/2013 1412   CALCIUM 9.4 02/08/2017 0827   CALCIUM 8.9 12/29/2014 0904   PROT 7.3 02/08/2017  0827   ALBUMIN 4.0 02/08/2017 0827   AST 34 02/08/2017 0827   ALT 34 02/08/2017 0827   ALKPHOS 163 (H) 02/08/2017 0827   BILITOT 0.8 02/08/2017 0827   GFRNONAA 24 (L) 01/30/2017 0906   GFRAA 27 (L) 01/30/2017 0906    Ref Range & Units 760moago 5449mogo 64m29moo 49mo51mo    Iron 45 - 182 ug/dL 96  110  74  107    TIBC 250 - 450 ug/dL 283  311  281  344    Saturation Ratios 17.9 - 39.5 % 34  35  26  31    UIBC ug/dL 187  201  207  237     Ref Range & Units 60mo 40mo62mo a26momo ag362moo ago560moFerritin 24 - 336 ng/mL 258  209  266  137     Assessment: 1. Anemia: Patient has a long history of anemia with normal iron studies, no prior evaluation by GI, patient has been treated with Procrit and iron infusions per his report by his nephrologist in the past, now with Hemoccult-positive stools; most likely anemia is caused by kidney disease and patient would likely benefit from follow by hematologist in the near future, but due to Hemoccult-positive stools and nausea at this time, will proceed with EGD and colonoscopy as the first step in his evaluation 2. + Hemoccult: Positive on 05/30/17 3. Nausea: Intermittently for the past year or so, worse recently, patient also has a decrease in appetite with this,  somewhat helped by Tums; consider gastritis versus other  Plan: 1. Scheduled patient for an EGD and colon[facility]. Discussed risks, benefits, limitations and alternatives and the patient agrees to proceed. 2. Could consider addition of a PPI versus H2 blocker for nausea if any signs of gastritis 3. Likely would recommend patient have follow by hematologist if studies are negative 4. Patient to return to clinic per recommendations from Dr. PerryHenrene Pastor  after time of procedures.  Ellouise Newer, PA-C [facility] 06/06/2017, 12:08 PM  Cc: Golden Circle, FNP

## 2017-06-06 NOTE — Patient Instructions (Signed)
You have been scheduled for a colonoscopy and endoscopy. Please follow written instructions given to you at your visit today.  Please pick up your prep supplies at the pharmacy within the next 1-3 days. If you use inhalers (even only as needed), please bring them with you on the day of your procedure. Your physician has requested that you go to www.[email or web address].com and enter the access code given to you at your visit today. This web site gives a general overview about your procedure. However, you should still follow specific instructions given to you by our office regarding your preparation for the procedure.  We may call you with a sooner date for the procedures.

## 2017-06-13 ENCOUNTER — Other Ambulatory Visit (INDEPENDENT_AMBULATORY_CARE_PROVIDER_SITE_OTHER): Payer: Medicare Other

## 2017-06-13 ENCOUNTER — Other Ambulatory Visit: Payer: Medicare Other

## 2017-06-13 ENCOUNTER — Encounter: Payer: Self-pay | Admitting: Family

## 2017-06-13 ENCOUNTER — Ambulatory Visit (INDEPENDENT_AMBULATORY_CARE_PROVIDER_SITE_OTHER): Payer: Medicare Other | Admitting: Family

## 2017-06-13 VITALS — BP 168/62 | HR 51 | Temp 97.7°F | Resp 16 | Ht 66.0 in | Wt 143.8 lb

## 2017-06-13 DIAGNOSIS — R110 Nausea: Secondary | ICD-10-CM | POA: Diagnosis not present

## 2017-06-13 DIAGNOSIS — D649 Anemia, unspecified: Secondary | ICD-10-CM

## 2017-06-13 LAB — RENAL FUNCTION PANEL
Albumin: 3.7 g/dL (ref 3.5–5.2)
BUN: 27 mg/dL — AB (ref 6–23)
CHLORIDE: 91 meq/L — AB (ref 96–112)
CO2: 22 meq/L (ref 19–32)
CREATININE: 1.89 mg/dL — AB (ref 0.40–1.50)
Calcium: 9 mg/dL (ref 8.4–10.5)
GFR: 35.82 mL/min — AB (ref >60.00)
Glucose, Bld: 184 mg/dL — ABNORMAL HIGH (ref 70–99)
PHOSPHORUS: 3.3 mg/dL (ref 2.3–4.6)
Potassium: 4.7 mEq/L (ref 3.5–5.1)
SODIUM: 122 meq/L — AB (ref 135–145)

## 2017-06-13 LAB — CBC
HCT: 27.5 % — ABNORMAL LOW (ref 39.0–52.0)
HEMOGLOBIN: 9.5 g/dL — AB (ref 13.0–17.0)
MCHC: 34.6 g/dL (ref 30.0–36.0)
MCV: 95.9 fl (ref 78.0–100.0)
PLATELETS: 143 10*3/uL — AB (ref 150.0–400.0)
RBC: 2.87 Mil/uL — AB (ref 4.22–5.81)
RDW: 14.3 % (ref 11.5–15.5)
WBC: 7.4 10*3/uL (ref 4.0–10.5)

## 2017-06-13 LAB — HEPATIC FUNCTION PANEL
ALK PHOS: 171 U/L — AB (ref 39–117)
ALT: 56 U/L — AB (ref 0–53)
AST: 43 U/L — ABNORMAL HIGH (ref 0–37)
Albumin: 3.7 g/dL (ref 3.5–5.2)
BILIRUBIN DIRECT: 1.7 mg/dL — AB (ref 0.0–0.3)
BILIRUBIN TOTAL: 2.4 mg/dL — AB (ref 0.2–1.2)
Total Protein: 6.8 g/dL (ref 6.0–8.3)

## 2017-06-13 LAB — IBC PANEL
Iron: 162 ug/dL (ref 42–165)
SATURATION RATIOS: 47 % (ref 20.0–50.0)
TRANSFERRIN: 246 mg/dL (ref 212.0–360.0)

## 2017-06-13 LAB — AMYLASE: AMYLASE: 48 U/L (ref 27–131)

## 2017-06-13 LAB — FERRITIN: FERRITIN: 208.7 ng/mL (ref 22.0–322.0)

## 2017-06-13 LAB — LIPASE: LIPASE: 65 U/L — AB (ref 11.0–59.0)

## 2017-06-13 NOTE — Progress Notes (Signed)
Subjective:    Patient ID: Gerald Jacobs, male    DOB: 1924-12-07, 83 y.o.   MRN: [id number]  Chief Complaint  Patient presents with  . Follow-up    x1 month has been having nausea, had positive hemacult can not see GI until september, no energy, some weakness    HPI:  Gerald Jacobs is a 83 y.o. male who  has a past medical history of Cancer ([place]); Chronic anemia; Coronary artery disease; Diabetes mellitus; Essential hypertension; GERD (gastroesophageal reflux disease); History of gout; Mild renal insufficiency; Nocturia; and Osteoarthritis. and presents today for a follow up office visit.  Recently evaluated in the office and found to have a positive hemoccult stool test. Seen by gastroenterology and noted to have long standing chronic anemia that was previously treated with Procrit and iron infusions. Has been scheduled for a colonoscopy and EGD. Also recommendations for follow up with hematology. Continues to experience the associated symptoms of nausea, decreased energy and some weakness. Has noted that his stools have changed color to grey/brown with questionable flecks that look like pepper. No overt or obvious bleeding.    Allergies  Allergen Reactions  . Colchicine Other (See Comments)    GI problems  . Flexeril [Cyclobenzaprine Hcl] Other (See Comments)    GI problems   . Levaquin [Levofloxacin In D5w] Other (See Comments)    insomnia  . Percocet [Oxycodone-Acetaminophen] Nausea And Vomiting  . Zebeta Other (See Comments)    Gi problems      Outpatient Medications Prior to Visit  Medication Sig Dispense Refill  . allopurinol (ZYLOPRIM) 100 MG tablet TAKE 1 TABLET (100 MG TOTAL) BY MOUTH DAILY. 90 tablet 1  . amLODipine (NORVASC) 2.5 MG tablet Take 1 tablet (2.5 mg total) by mouth daily. 30 tablet 11  . aspirin 81 MG tablet Take 81 mg by mouth daily.      . Biotin 1000 MCG tablet Take 1,000 mcg by mouth daily.    . diphenhydramine-acetaminophen (TYLENOL PM)  25-500 MG TABS Take 1 tablet by mouth at bedtime as needed (sleep).    Marland Kitchen glucose blood test strip Use one strip per test. Check blood sugars 1-4 times per day as instructed. 100 each 12  . L-Lysine 500 MG CAPS Take 500 mg by mouth 2 (two) times daily.    Marland Kitchen losartan-hydrochlorothiazide (HYZAAR) 100-12.5 MG tablet TAKE 1/2 TABLET BY MOUTH DAILY 45 tablet 3  . metoprolol succinate (TOPROL-XL) 50 MG 24 hr tablet Take 1 tablet by mouth every morning.  3  . Omega-3 Fatty Acids (FISH OIL PO) Take 1 capsule by mouth daily.    Glory Rosebush DELICA LANCETS 50P MISC TEST BLOOD SUGARS 1-4 TIMES A DAY AS INSTRUCTED. DX CODE: E11.9 100 each 2  . saxagliptin HCl (ONGLYZA) 2.5 MG TABS tablet Take 2.5 mg by mouth daily.     No facility-administered medications prior to visit.       Past Surgical History:  Procedure Laterality Date  . CHOLECYSTECTOMY  02/04/2014   DR Zella Richer  . CHOLECYSTECTOMY N/A 02/04/2014   Procedure: LAPAROSCOPIC CHOLECYSTECTOMY with intraoperative cholangiogram;  Surgeon: Odis Hollingshead, MD;  Location: [place];  Service: General;  Laterality: N/A;  . ERCP N/A 10/15/2013   Procedure: ENDOSCOPIC RETROGRADE CHOLANGIOPANCREATOGRAPHY (ERCP);  Surgeon: Jeryl Columbia, MD;  Location: [facility];  Service: Endoscopy;  Laterality: N/A;  . KNEE SURGERY Right 1979  . melanoma surgery  1970's   on scalp  . [place]  Prostate Cancer  . SPHINCTEROTOMY  10/15/2013   Procedure: SPHINCTEROTOMY;  Surgeon: Jeryl Columbia, MD;  Location: [facility];  Service: Endoscopy;;      Past Medical History:  Diagnosis Date  . Cancer [facility])    prostate  . Chronic anemia    With normal iron studies and normal serum protein electrophoresis  . Coronary artery disease    Mild  . Diabetes mellitus    Adult onset  . Essential hypertension   . GERD (gastroesophageal reflux disease)    occ tums  . History of gout   . Mild renal insufficiency   . Nocturia    x2  . Osteoarthritis        Review of Systems  Constitutional: Positive for appetite change and fatigue. Negative for chills and fever.  Respiratory: Negative for chest tightness and shortness of breath.   Cardiovascular: Negative for chest pain, palpitations and leg swelling.  Gastrointestinal: Positive for abdominal pain and nausea. Negative for abdominal distention, anal bleeding, blood in stool, constipation, diarrhea, rectal pain and vomiting.  Neurological: Positive for dizziness and weakness. Negative for syncope.      Objective:    BP (!) 168/62 (BP Location: Left Arm, Patient Position: Sitting, Cuff Size: Normal)   Pulse (!) 51   Temp 97.7 F (36.5 C) (Oral)   Resp 16   Ht 5\' 6"  (1.676 m)   Wt 143 lb 12.8 oz (65.2 kg)   SpO2 97%   BMI 23.21 kg/m  Nursing note and vital signs reviewed.   Physical Exam  Constitutional: He is oriented to person, place, and time. He appears well-developed and well-nourished. No distress.  Cardiovascular: Normal rate, regular rhythm, normal heart sounds and intact distal pulses.   Pulmonary/Chest: Effort normal and breath sounds normal. No respiratory distress. He has no wheezes. He has no rales. He exhibits no tenderness.  Abdominal: Soft. Bowel sounds are normal. He exhibits no distension and no mass. There is no tenderness. There is no rebound and no guarding.  Neurological: He is alert and oriented to person, place, and time.  Skin: Skin is warm and dry. There is pallor.  Psychiatric: He has a normal mood and affect. His behavior is normal. Judgment and thought content normal.       Assessment & Plan:   Problem List Items Addressed This Visit      Other   Anemia - Primary    Previously diagnosed with anemia and appears to be have worsening symptoms since most recent evaluation. Denies any frank or observed blood. Hold aspirin. Obtain CBC, IBC panel, Ferritin, H. Pylori stool, renal function panel, and liver function panel. Consider CT scan pending blood  work and follow up with gastroenterology.       Relevant Orders   CBC (Completed)   IBC panel (Completed)   Ferritin (Completed)   Helicobacter pylori special antigen   Renal Function Panel (Completed)   Hepatic function panel (Completed)    Other Visit Diagnoses    Nausea       Relevant Orders   Amylase (Completed)   Lipase (Completed)   Renal Function Panel (Completed)   Hepatic function panel (Completed)       I am having Mr. Nile maintain his aspirin, diphenhydramine-acetaminophen, Biotin, Omega-3 Fatty Acids (FISH OIL PO), glucose blood, losartan-hydrochlorothiazide, allopurinol, metoprolol succinate, amLODipine, ONETOUCH DELICA LANCETS 23F, saxagliptin HCl, and L-Lysine.   Follow-up: Return if symptoms worsen or fail to improve.  Mauricio Po, FNP

## 2017-06-13 NOTE — Assessment & Plan Note (Addendum)
Previously diagnosed with anemia and appears to be have worsening symptoms since most recent evaluation. Denies any frank or observed blood. Hold aspirin. Obtain CBC, IBC panel, Ferritin, H. Pylori stool, renal function panel, and liver function panel. Consider CT scan pending blood work and follow up with gastroenterology.

## 2017-06-13 NOTE — Patient Instructions (Addendum)
Thank you for choosing [facility].  SUMMARY AND INSTRUCTIONS:  Please hold your aspirin until blood work results.  We are checking your liver, kidneys, and abdomen.   We will check your stool for a bacterial infection.  Further treatment will depend on your blood work.   Continue to take your medication as prescribed.   Labs:  Please stop by the lab on the lower level of the building for your blood work. Your results will be released to [place] (or called to you) after review, usually within 72 hours after test completion. If any changes need to be made, you will be notified at that same time.  1.) The lab is open from 7:30am to 5:30 pm Monday-Friday 2.) No appointment is necessary 3.) Fasting (if needed) is 6-8 hours after food and drink; black coffee and water are okay   Imaging / Radiology:  Please stop by radiology on the basement level of the building for your x-rays. Your results will be released to [place] (or called to you) after review, usually within 72 hours after test completion. If any treatments or changes are necessary, you will be notified at that same time.  Referrals:  Referrals have been made during this visit. You should expect to hear back from our schedulers in about 7-10 days in regards to establishing an appointment with the specialists we discussed.    Gastrointestinal Bleeding Gastrointestinal bleeding is bleeding somewhere along the path food travels through the body (digestive tract). This path is anywhere between the mouth and the opening of the butt (anus). You may have blood in your poop (stools) or have black poop. If you throw up (vomit), there may be blood in it. This condition can be mild, serious, or even life-threatening. If you have a lot of bleeding, you may need to stay in the hospital. Follow these instructions at home:  Take over-the-counter and prescription medicines only as told by your doctor.  Eat foods that have a lot of  fiber in them. These foods include whole grains, fruits, and vegetables. You can also try eating 1-3 prunes each day.  Drink enough fluid to keep your pee (urine) clear or pale yellow.  Keep all follow-up visits as told by your doctor. This is important. Contact a doctor if:  Your symptoms do not get better. Get help right away if:  Your bleeding gets worse.  You feel dizzy or you pass out (faint).  You feel weak.  You have very bad cramps in your back or belly (abdomen).  You pass large clumps of blood (clots) in your poop.  Your symptoms are getting worse. This information is not intended to replace advice given to you by your health care provider. Make sure you discuss any questions you have with your health care provider. Document Released: 08/23/2008 Document Revised: 04/21/2016 Document Reviewed: 05/04/2015 Elsevier Interactive Patient Education  2018 [employer].  Follow up:  If your symptoms worsen or fail to improve, please contact our office for further instruction, or in case of emergency go directly to the emergency room at the closest medical facility.

## 2017-06-14 ENCOUNTER — Other Ambulatory Visit: Payer: Self-pay | Admitting: Family

## 2017-06-14 DIAGNOSIS — R748 Abnormal levels of other serum enzymes: Secondary | ICD-10-CM

## 2017-06-14 DIAGNOSIS — R7989 Other specified abnormal findings of blood chemistry: Secondary | ICD-10-CM

## 2017-06-14 DIAGNOSIS — R945 Abnormal results of liver function studies: Secondary | ICD-10-CM

## 2017-06-14 LAB — HELICOBACTER PYLORI  SPECIAL ANTIGEN: H. PYLORI Antigen: NOT DETECTED

## 2017-06-15 ENCOUNTER — Inpatient Hospital Stay (HOSPITAL_COMMUNITY): Payer: Medicare Other

## 2017-06-15 ENCOUNTER — Telehealth: Payer: Self-pay

## 2017-06-15 ENCOUNTER — Telehealth: Payer: Self-pay | Admitting: *Deleted

## 2017-06-15 ENCOUNTER — Encounter (HOSPITAL_COMMUNITY): Payer: Self-pay | Admitting: Nurse Practitioner

## 2017-06-15 ENCOUNTER — Other Ambulatory Visit: Payer: Self-pay

## 2017-06-15 ENCOUNTER — Other Ambulatory Visit (INDEPENDENT_AMBULATORY_CARE_PROVIDER_SITE_OTHER): Payer: Medicare Other

## 2017-06-15 ENCOUNTER — Inpatient Hospital Stay (HOSPITAL_COMMUNITY)
Admission: EM | Admit: 2017-06-15 | Discharge: 2017-06-20 | DRG: 445 | Disposition: A | Discharge status: Other Acute Inpatient Hospital | Payer: Medicare Other | Attending: Internal Medicine | Admitting: Internal Medicine

## 2017-06-15 DIAGNOSIS — N1330 Unspecified hydronephrosis: Secondary | ICD-10-CM | POA: Diagnosis not present

## 2017-06-15 DIAGNOSIS — R634 Abnormal weight loss: Secondary | ICD-10-CM | POA: Diagnosis present

## 2017-06-15 DIAGNOSIS — R110 Nausea: Secondary | ICD-10-CM

## 2017-06-15 DIAGNOSIS — R160 Hepatomegaly, not elsewhere classified: Secondary | ICD-10-CM | POA: Diagnosis not present

## 2017-06-15 DIAGNOSIS — E785 Hyperlipidemia, unspecified: Secondary | ICD-10-CM | POA: Diagnosis not present

## 2017-06-15 DIAGNOSIS — L299 Pruritus, unspecified: Secondary | ICD-10-CM | POA: Diagnosis present

## 2017-06-15 DIAGNOSIS — R627 Adult failure to thrive: Secondary | ICD-10-CM | POA: Diagnosis not present

## 2017-06-15 DIAGNOSIS — Z794 Long term (current) use of insulin: Secondary | ICD-10-CM | POA: Diagnosis not present

## 2017-06-15 DIAGNOSIS — K831 Obstruction of bile duct: Principal | ICD-10-CM | POA: Diagnosis present

## 2017-06-15 DIAGNOSIS — I10 Essential (primary) hypertension: Secondary | ICD-10-CM | POA: Diagnosis not present

## 2017-06-15 DIAGNOSIS — Z7984 Long term (current) use of oral hypoglycemic drugs: Secondary | ICD-10-CM

## 2017-06-15 DIAGNOSIS — E860 Dehydration: Secondary | ICD-10-CM | POA: Diagnosis not present

## 2017-06-15 DIAGNOSIS — E1122 Type 2 diabetes mellitus with diabetic chronic kidney disease: Secondary | ICD-10-CM | POA: Diagnosis present

## 2017-06-15 DIAGNOSIS — R7989 Other specified abnormal findings of blood chemistry: Secondary | ICD-10-CM | POA: Diagnosis not present

## 2017-06-15 DIAGNOSIS — I129 Hypertensive chronic kidney disease with stage 1 through stage 4 chronic kidney disease, or unspecified chronic kidney disease: Secondary | ICD-10-CM | POA: Diagnosis present

## 2017-06-15 DIAGNOSIS — I708 Atherosclerosis of other arteries: Secondary | ICD-10-CM | POA: Diagnosis not present

## 2017-06-15 DIAGNOSIS — N184 Chronic kidney disease, stage 4 (severe): Secondary | ICD-10-CM | POA: Diagnosis present

## 2017-06-15 DIAGNOSIS — E440 Moderate protein-calorie malnutrition: Secondary | ICD-10-CM | POA: Diagnosis present

## 2017-06-15 DIAGNOSIS — D631 Anemia in chronic kidney disease: Secondary | ICD-10-CM | POA: Diagnosis not present

## 2017-06-15 DIAGNOSIS — Z7982 Long term (current) use of aspirin: Secondary | ICD-10-CM | POA: Diagnosis not present

## 2017-06-15 DIAGNOSIS — Z8546 Personal history of malignant neoplasm of prostate: Secondary | ICD-10-CM | POA: Diagnosis not present

## 2017-06-15 DIAGNOSIS — Z9049 Acquired absence of other specified parts of digestive tract: Secondary | ICD-10-CM | POA: Diagnosis not present

## 2017-06-15 DIAGNOSIS — E871 Hypo-osmolality and hyponatremia: Secondary | ICD-10-CM | POA: Diagnosis present

## 2017-06-15 DIAGNOSIS — K219 Gastro-esophageal reflux disease without esophagitis: Secondary | ICD-10-CM | POA: Diagnosis present

## 2017-06-15 DIAGNOSIS — Z6822 Body mass index (BMI) 22.0-22.9, adult: Secondary | ICD-10-CM

## 2017-06-15 DIAGNOSIS — D696 Thrombocytopenia, unspecified: Secondary | ICD-10-CM | POA: Diagnosis not present

## 2017-06-15 DIAGNOSIS — Z87891 Personal history of nicotine dependence: Secondary | ICD-10-CM | POA: Diagnosis not present

## 2017-06-15 DIAGNOSIS — I2510 Atherosclerotic heart disease of native coronary artery without angina pectoris: Secondary | ICD-10-CM | POA: Diagnosis present

## 2017-06-15 DIAGNOSIS — D649 Anemia, unspecified: Secondary | ICD-10-CM | POA: Diagnosis not present

## 2017-06-15 DIAGNOSIS — R17 Unspecified jaundice: Secondary | ICD-10-CM | POA: Diagnosis not present

## 2017-06-15 DIAGNOSIS — Z8619 Personal history of other infectious and parasitic diseases: Secondary | ICD-10-CM

## 2017-06-15 DIAGNOSIS — R5383 Other fatigue: Secondary | ICD-10-CM | POA: Diagnosis not present

## 2017-06-15 DIAGNOSIS — N281 Cyst of kidney, acquired: Secondary | ICD-10-CM | POA: Diagnosis present

## 2017-06-15 DIAGNOSIS — R195 Other fecal abnormalities: Secondary | ICD-10-CM | POA: Diagnosis not present

## 2017-06-15 DIAGNOSIS — Z85820 Personal history of malignant melanoma of skin: Secondary | ICD-10-CM

## 2017-06-15 DIAGNOSIS — R932 Abnormal findings on diagnostic imaging of liver and biliary tract: Secondary | ICD-10-CM | POA: Diagnosis not present

## 2017-06-15 HISTORY — DX: Other disorders of bilirubin metabolism: E80.6

## 2017-06-15 HISTORY — DX: Hypo-osmolality and hyponatremia: E87.1

## 2017-06-15 HISTORY — DX: Abnormal weight loss: R63.4

## 2017-06-15 LAB — CBC
HCT: 25 % — ABNORMAL LOW (ref 39.0–52.0)
Hemoglobin: 8.5 g/dL — ABNORMAL LOW (ref 13.0–17.0)
MCH: 31.1 pg (ref 26.0–34.0)
MCHC: 34 g/dL (ref 30.0–36.0)
MCV: 91.6 fL (ref 78.0–100.0)
Platelets: 140 10*3/uL — ABNORMAL LOW (ref 150–400)
RBC: 2.73 MIL/uL — ABNORMAL LOW (ref 4.22–5.81)
RDW: 13.8 % (ref 11.5–15.5)
WBC: 7.2 10*3/uL (ref 4.0–10.5)

## 2017-06-15 LAB — RENAL FUNCTION PANEL
ALBUMIN: 3.5 g/dL (ref 3.5–5.2)
BUN: 29 mg/dL — AB (ref 6–23)
CALCIUM: 8.6 mg/dL (ref 8.4–10.5)
CHLORIDE: 92 meq/L — AB (ref 96–112)
CO2: 21 meq/L (ref 19–32)
Creatinine, Ser: 1.8 mg/dL — ABNORMAL HIGH (ref 0.40–1.50)
GFR: 37.89 mL/min — ABNORMAL LOW (ref >60.00)
Glucose, Bld: 140 mg/dL — ABNORMAL HIGH (ref 70–99)
POTASSIUM: 4.5 meq/L (ref 3.5–5.1)
Phosphorus: 3.9 mg/dL (ref 2.3–4.6)
SODIUM: 119 meq/L — AB (ref 135–145)

## 2017-06-15 LAB — TSH: TSH: 2.073 u[IU]/mL (ref 0.350–4.500)

## 2017-06-15 LAB — NA AND K (SODIUM & POTASSIUM), RAND UR
POTASSIUM UR: 19 mmol/L
SODIUM UR: 43 mmol/L

## 2017-06-15 LAB — URINALYSIS, ROUTINE W REFLEX MICROSCOPIC
Bacteria, UA: NONE SEEN
Bilirubin Urine: NEGATIVE
Glucose, UA: NEGATIVE mg/dL
Hgb urine dipstick: NEGATIVE
Ketones, ur: NEGATIVE mg/dL
Leukocytes, UA: NEGATIVE
Nitrite: NEGATIVE
Protein, ur: 100 mg/dL — AB
Specific Gravity, Urine: 1.005 (ref 1.005–1.030)
Squamous Epithelial / LPF: NONE SEEN
pH: 6 (ref 5.0–8.0)

## 2017-06-15 LAB — COMPREHENSIVE METABOLIC PANEL
ALT: 61 U/L (ref 17–63)
AST: 50 U/L — ABNORMAL HIGH (ref 15–41)
Albumin: 3.4 g/dL — ABNORMAL LOW (ref 3.5–5.0)
Alkaline Phosphatase: 160 U/L — ABNORMAL HIGH (ref 38–126)
Anion gap: 7 (ref 5–15)
BUN: 27 mg/dL — ABNORMAL HIGH (ref 6–20)
CO2: 20 mmol/L — ABNORMAL LOW (ref 22–32)
Calcium: 8.7 mg/dL — ABNORMAL LOW (ref 8.9–10.3)
Chloride: 95 mmol/L — ABNORMAL LOW (ref 101–111)
Creatinine, Ser: 1.92 mg/dL — ABNORMAL HIGH (ref 0.61–1.24)
GFR calc Af Amer: 34 mL/min — ABNORMAL LOW (ref >60)
GFR calc non Af Amer: 29 mL/min — ABNORMAL LOW (ref >60)
Glucose, Bld: 118 mg/dL — ABNORMAL HIGH (ref 65–99)
Potassium: 4.7 mmol/L (ref 3.5–5.1)
Sodium: 122 mmol/L — ABNORMAL LOW (ref 135–145)
Total Bilirubin: 3.1 mg/dL — ABNORMAL HIGH (ref 0.3–1.2)
Total Protein: 6.8 g/dL (ref 6.5–8.1)

## 2017-06-15 LAB — CREATININE, URINE, RANDOM: Creatinine, Urine: 36.59 mg/dL

## 2017-06-15 LAB — OSMOLALITY, URINE: OSMOLALITY UR: 207 mosm/kg — AB (ref 300–900)

## 2017-06-15 LAB — GLUCOSE, CAPILLARY: GLUCOSE-CAPILLARY: 108 mg/dL — AB (ref 65–99)

## 2017-06-15 LAB — URIC ACID: Uric Acid, Serum: 5.2 mg/dL (ref 4.4–7.6)

## 2017-06-15 LAB — OSMOLALITY: Osmolality: 268 mOsm/kg — ABNORMAL LOW (ref 275–295)

## 2017-06-15 MED ORDER — ALLOPURINOL 100 MG PO TABS
100.0000 mg | ORAL_TABLET | Freq: Every day | ORAL | Status: DC
  Administered 2017-06-16 – 2017-06-20 (×5): 100 mg via ORAL
  Filled 2017-06-15 (×5): qty 1

## 2017-06-15 MED ORDER — ONDANSETRON HCL 4 MG PO TABS: 4.0000 mg | ORAL_TABLET | Freq: Four times a day (QID) | ORAL | Status: DC | PRN

## 2017-06-15 MED ORDER — ENSURE ENLIVE PO LIQD
237.0000 mL | Freq: Two times a day (BID) | ORAL | Status: DC
  Administered 2017-06-16: 237 mL via ORAL

## 2017-06-15 MED ORDER — ACETAMINOPHEN 650 MG RE SUPP: 650.0000 mg | Freq: Four times a day (QID) | RECTAL | Status: DC | PRN

## 2017-06-15 MED ORDER — IOPAMIDOL (ISOVUE-300) INJECTION 61%
INTRAVENOUS | Status: AC
  Filled 2017-06-15: qty 30

## 2017-06-15 MED ORDER — INSULIN ASPART 100 UNIT/ML ~~LOC~~ SOLN: 0.0000 [IU] | Freq: Every day | SUBCUTANEOUS | Status: DC

## 2017-06-15 MED ORDER — INSULIN ASPART 100 UNIT/ML ~~LOC~~ SOLN
0.0000 [IU] | Freq: Three times a day (TID) | SUBCUTANEOUS | Status: DC
  Administered 2017-06-16: 2 [IU] via SUBCUTANEOUS
  Administered 2017-06-17 – 2017-06-18 (×2): 1 [IU] via SUBCUTANEOUS
  Administered 2017-06-18: 2 [IU] via SUBCUTANEOUS
  Administered 2017-06-18 – 2017-06-19 (×2): 1 [IU] via SUBCUTANEOUS
  Administered 2017-06-19: 2 [IU] via SUBCUTANEOUS

## 2017-06-15 MED ORDER — ASPIRIN EC 81 MG PO TBEC
81.0000 mg | DELAYED_RELEASE_TABLET | Freq: Every day | ORAL | Status: DC
  Administered 2017-06-16: 81 mg via ORAL
  Filled 2017-06-15: qty 1

## 2017-06-15 MED ORDER — AMLODIPINE BESYLATE 10 MG PO TABS
10.0000 mg | ORAL_TABLET | Freq: Every day | ORAL | Status: DC
  Administered 2017-06-15 – 2017-06-19 (×5): 10 mg via ORAL
  Filled 2017-06-15 (×5): qty 1

## 2017-06-15 MED ORDER — ONDANSETRON HCL 4 MG/2ML IJ SOLN
4.0000 mg | Freq: Four times a day (QID) | INTRAMUSCULAR | Status: DC | PRN
  Administered 2017-06-16: 4 mg via INTRAVENOUS
  Filled 2017-06-15: qty 2

## 2017-06-15 MED ORDER — ACETAMINOPHEN 325 MG PO TABS: 650.0000 mg | ORAL_TABLET | Freq: Four times a day (QID) | ORAL | Status: DC | PRN

## 2017-06-15 MED ORDER — CALCIUM CARBONATE ANTACID 500 MG PO CHEW
1.0000 | CHEWABLE_TABLET | Freq: Once | ORAL | Status: AC
  Administered 2017-06-15: 200 mg via ORAL
  Filled 2017-06-15: qty 1

## 2017-06-15 MED ORDER — ALUM & MAG HYDROXIDE-SIMETH 200-200-20 MG/5ML PO SUSP
15.0000 mL | Freq: Four times a day (QID) | ORAL | Status: DC | PRN
  Administered 2017-06-16 – 2017-06-17 (×3): 15 mL via ORAL
  Filled 2017-06-15 (×3): qty 30

## 2017-06-15 MED ORDER — ENOXAPARIN SODIUM 30 MG/0.3ML ~~LOC~~ SOLN
30.0000 mg | SUBCUTANEOUS | Status: DC
  Administered 2017-06-15: 30 mg via SUBCUTANEOUS
  Filled 2017-06-15: qty 0.3

## 2017-06-15 MED ORDER — METOPROLOL SUCCINATE ER 100 MG PO TB24
100.0000 mg | ORAL_TABLET | Freq: Every morning | ORAL | Status: DC
  Administered 2017-06-16 – 2017-06-20 (×5): 100 mg via ORAL
  Filled 2017-06-15 (×5): qty 1

## 2017-06-15 NOTE — Telephone Encounter (Signed)
LVM for pt to call back. Need STAT labs done for him before CT tomorrow.

## 2017-06-15 NOTE — ED Provider Notes (Signed)
[facility] DEPT Provider Note   CSN: [id number] Arrival date & time: 06/15/17  1634  By signing my name below, I, Reola Mosher, attest that this documentation has been prepared under the direction and in the presence of Virgel Manifold, MD. Electronically Signed: Reola Mosher, ED Scribe. 06/15/17. 6:37 PM.  History   Chief Complaint Chief Complaint  Patient presents with  . Abnormal Lab   The history is provided by the patient. No language interpreter was used.   HPI Comments: Gerald Jacobs is a 83 y.o. male with a h/o anemia, CAD, DM, HTN, GERD, prostate cancer, gout, who presents to the Emergency Department following abnormal lab value which was drawn this afternoon. Pt recently underwent routine lab work two days ago prior to CT abdomen which was to occur in the near future. He states that he went in for blood work this afternoon to have his CT tomorrow morning; shortly following this he received a call from his PCP's office notifying him of his sodium level which was 119.4. Additionally, pt reports that he has been increasingly nauseous, fatigued, generally weak, and experiencing loss of appetite recently as well. Pt has a PSHx to the abdomen including a cholecystectomy. He also reports that ~7 months ago he was placed on Amlodipine by his cardiologist; no other recent new medications.  He denies vomiting, lightheadedness, syncope, confusion, seizures, significant weight loss, or any other associated symptoms.   Additionally, he reports that he is experiencing burning indigestion and is requesting an antacid while in the ED.   Past Medical History:  Diagnosis Date  . Cancer [facility])    prostate  . Chronic anemia    With normal iron studies and normal serum protein electrophoresis  . Coronary artery disease    Mild  . Diabetes mellitus    Adult onset  . Essential hypertension   . GERD (gastroesophageal reflux disease)    occ tums  . History of gout   . Mild  renal insufficiency   . Nocturia    x2  . Osteoarthritis    Patient Active Problem List   Diagnosis Date Noted  . Cough 10/27/2016  . Chest pain 10/27/2016  . Essential hypertension 09/02/2016  . Bilateral shoulder pain 07/28/2016  . Sciatica of right side 07/28/2016  . Statin myopathy 07/28/2016  . Anemia 06/23/2016  . Medicare annual wellness visit, subsequent 01/18/2016  . Routine general medical examination at a health care facility 01/18/2016  . Frequent PVCs 11/24/2014  . Intermittent palpitations 07/23/2014  . Chronic cholecystitis with calculus 02/04/2014  . Choledocholithiasis 08/28/2013  . Symptomatic cholelithiasis 06/04/2013  . Diarrhea due to drug 06/04/2013  . Elevated LFTs 06/04/2013  . Benign hypertensive heart disease without heart failure 09/07/2011  . Type 2 diabetes mellitus ([place]) 09/07/2011  . Dyslipidemia 09/07/2011  . Chronic renal insufficiency, stage III (moderate) 09/07/2011   Past Surgical History:  Procedure Laterality Date  . CHOLECYSTECTOMY  02/04/2014   DR Zella Richer  . CHOLECYSTECTOMY N/A 02/04/2014   Procedure: LAPAROSCOPIC CHOLECYSTECTOMY with intraoperative cholangiogram;  Surgeon: Odis Hollingshead, MD;  Location: [place];  Service: General;  Laterality: N/A;  . ERCP N/A 10/15/2013   Procedure: ENDOSCOPIC RETROGRADE CHOLANGIOPANCREATOGRAPHY (ERCP);  Surgeon: Jeryl Columbia, MD;  Location: [facility];  Service: Endoscopy;  Laterality: N/A;  . KNEE SURGERY Right 1979  . melanoma surgery  1970's   on scalp  . PROSTATE SURGERY  1993   Prostate Cancer  . SPHINCTEROTOMY  10/15/2013   Procedure:  SPHINCTEROTOMY;  Surgeon: Jeryl Columbia, MD;  Location: [facility];  Service: Endoscopy;;    Home Medications    Prior to Admission medications   Medication Sig Start Date End Date Taking? Authorizing Provider  allopurinol (ZYLOPRIM) 100 MG tablet TAKE 1 TABLET (100 MG TOTAL) BY MOUTH DAILY. 03/15/17   Golden Circle, FNP  amLODipine (NORVASC) 2.5  MG tablet Take 1 tablet (2.5 mg total) by mouth daily. 03/15/17 06/13/17  Nahser, Wonda Cheng, MD  aspirin 81 MG tablet Take 81 mg by mouth daily.      [provider]  Biotin 1000 MCG tablet Take 1,000 mcg by mouth daily.    [provider]  diphenhydramine-acetaminophen (TYLENOL PM) 25-500 MG TABS Take 1 tablet by mouth at bedtime as needed (sleep).    [provider]  glucose blood test strip Use one strip per test. Check blood sugars 1-4 times per day as instructed. 01/18/16   Golden Circle, FNP  L-Lysine 500 MG CAPS Take 500 mg by mouth 2 (two) times daily.    [provider]  losartan-hydrochlorothiazide (HYZAAR) 100-12.5 MG tablet TAKE 1/2 TABLET BY MOUTH DAILY 03/09/17   Burtis Junes, NP  metoprolol succinate (TOPROL-XL) 50 MG 24 hr tablet Take 1 tablet by mouth every morning. 01/04/17   [provider]  Omega-3 Fatty Acids (FISH OIL PO) Take 1 capsule by mouth daily.    [provider]  [facility] DELICA LANCETS 96Q MISC TEST BLOOD SUGARS 1-4 TIMES A DAY AS INSTRUCTED. DX CODE: E11.9 03/29/17   Golden Circle, FNP  saxagliptin HCl (ONGLYZA) 2.5 MG TABS tablet Take 2.5 mg by mouth daily.    [provider]   Family History Family History  Problem Relation Age of Onset  . Stroke Father   . Stroke Mother   . Stroke Sister   . Stroke Brother   . Stroke Brother   . Stroke Brother    Social History Social History  Substance Use Topics  . Smoking status: Former Smoker    Packs/day: 1.00    Years: 45.00    Types: Cigarettes    Quit date: 09/07/1979  . Smokeless tobacco: Never Used  . Alcohol use Yes     Comment: beer rarely   Allergies   Colchicine; Flexeril [cyclobenzaprine hcl]; Levaquin [levofloxacin in d5w]; Percocet [oxycodone-acetaminophen]; and Zebeta  Review of Systems Review of Systems  Constitutional: Positive for appetite change and fatigue. Negative for unexpected weight change.  Gastrointestinal:  Positive for nausea. Negative for vomiting.  Neurological: Positive for weakness. Negative for seizures and syncope.  All other systems reviewed and are negative.  Physical Exam Updated Vital Signs BP (!) 178/70 (BP Location: Right Arm)   Pulse 66   Temp 98 F (36.7 C) (Oral)   Resp 16   SpO2 100%   Physical Exam  Constitutional: He appears well-developed and well-nourished.  HENT:  Head: Normocephalic.  Right Ear: External ear normal.  Left Ear: External ear normal.  Nose: Nose normal.  Eyes: Conjunctivae are normal. Right eye exhibits no discharge. Left eye exhibits no discharge.  Neck: Normal range of motion.  Cardiovascular: Normal rate, regular rhythm and normal heart sounds.   No murmur heard. Pulmonary/Chest: Effort normal and breath sounds normal. No respiratory distress. He has no wheezes. He has no rales.  Abdominal: Soft. There is no tenderness. There is no rebound and no guarding.  Musculoskeletal: Normal range of motion. He exhibits no edema or tenderness.  Neurological:  He is alert. No cranial nerve deficit. Coordination normal.  Skin: Skin is warm and dry. No rash noted. No erythema. No pallor.  Psychiatric: He has a normal mood and affect. His behavior is normal.  Nursing note and vitals reviewed.  ED Treatments / Results  DIAGNOSTIC STUDIES: Oxygen Saturation is 100% on RA, normal by my interpretation.   COORDINATION OF CARE: 6:37 PM-Discussed next steps with pt. Pt verbalized understanding and is agreeable with the plan.   Labs (all labs ordered are listed, but only abnormal results are displayed) Labs Reviewed  COMPREHENSIVE METABOLIC PANEL - Abnormal; Notable for the following:       Result Value   Sodium 122 (*)    Chloride 95 (*)    CO2 20 (*)    Glucose, Bld 118 (*)    BUN 27 (*)    Creatinine, Ser 1.92 (*)    Calcium 8.7 (*)    Albumin 3.4 (*)    AST 50 (*)    Alkaline Phosphatase 160 (*)    Total Bilirubin 3.1 (*)    GFR calc non Af  Amer 29 (*)    GFR calc Af Amer 34 (*)    All other components within normal limits  CBC - Abnormal; Notable for the following:    RBC 2.73 (*)    Hemoglobin 8.5 (*)    HCT 25.0 (*)    Platelets 140 (*)    All other components within normal limits  URINALYSIS, ROUTINE W REFLEX MICROSCOPIC - Abnormal; Notable for the following:    Protein, ur 100 (*)    All other components within normal limits  OSMOLALITY, URINE - Abnormal; Notable for the following:    Osmolality, Ur 207 (*)    All other components within normal limits  OSMOLALITY - Abnormal; Notable for the following:    Osmolality 268 (*)    All other components within normal limits  CBC - Abnormal; Notable for the following:    RBC 2.68 (*)    Hemoglobin 8.2 (*)    HCT 24.3 (*)    Platelets 121 (*)    All other components within normal limits  BILIRUBIN, FRACTIONATED(TOT/DIR/INDIR) - Abnormal; Notable for the following:    Total Bilirubin 3.0 (*)    Bilirubin, Direct 2.1 (*)    All other components within normal limits  COMPREHENSIVE METABOLIC PANEL - Abnormal; Notable for the following:    Sodium 124 (*)    Chloride 96 (*)    CO2 20 (*)    Glucose, Bld 112 (*)    BUN 28 (*)    Creatinine, Ser 1.83 (*)    Calcium 8.6 (*)    Total Protein 6.2 (*)    Albumin 2.9 (*)    AST 48 (*)    Alkaline Phosphatase 150 (*)    Total Bilirubin 3.2 (*)    GFR calc non Af Amer 31 (*)    GFR calc Af Amer 36 (*)    All other components within normal limits  GLUCOSE, CAPILLARY - Abnormal; Notable for the following:    Glucose-Capillary 108 (*)    All other components within normal limits  GLUCOSE, CAPILLARY - Abnormal; Notable for the following:    Glucose-Capillary 106 (*)    All other components within normal limits  GLUCOSE, CAPILLARY - Abnormal; Notable for the following:    Glucose-Capillary 119 (*)    All other components within normal limits  BASIC METABOLIC PANEL - Abnormal; Notable for the following:  Sodium 122 (*)      Chloride 92 (*)    CO2 21 (*)    Glucose, Bld 158 (*)    BUN 27 (*)    Creatinine, Ser 1.79 (*)    Calcium 8.5 (*)    GFR calc non Af Amer 32 (*)    GFR calc Af Amer 37 (*)    All other components within normal limits  GLUCOSE, CAPILLARY - Abnormal; Notable for the following:    Glucose-Capillary 164 (*)    All other components within normal limits  COMPREHENSIVE METABOLIC PANEL - Abnormal; Notable for the following:    Sodium 124 (*)    Chloride 97 (*)    CO2 20 (*)    BUN 34 (*)    Creatinine, Ser 1.92 (*)    Calcium 8.7 (*)    Total Protein 6.2 (*)    Albumin 2.9 (*)    AST 48 (*)    Alkaline Phosphatase 148 (*)    Total Bilirubin 3.0 (*)    GFR calc non Af Amer 29 (*)    GFR calc Af Amer 34 (*)    All other components within normal limits  CBC - Abnormal; Notable for the following:    RBC 2.58 (*)    Hemoglobin 8.0 (*)    HCT 23.4 (*)    Platelets 115 (*)    All other components within normal limits  GLUCOSE, CAPILLARY - Abnormal; Notable for the following:    Glucose-Capillary 111 (*)    All other components within normal limits  GLUCOSE, CAPILLARY - Abnormal; Notable for the following:    Glucose-Capillary 133 (*)    All other components within normal limits  GLUCOSE, CAPILLARY - Abnormal; Notable for the following:    Glucose-Capillary 116 (*)    All other components within normal limits  COMPREHENSIVE METABOLIC PANEL - Abnormal; Notable for the following:    Sodium 123 (*)    Chloride 95 (*)    CO2 20 (*)    Glucose, Bld 126 (*)    BUN 42 (*)    Creatinine, Ser 2.33 (*)    Calcium 8.5 (*)    Total Protein 6.2 (*)    Albumin 2.9 (*)    AST 58 (*)    Alkaline Phosphatase 160 (*)    Total Bilirubin 3.0 (*)    GFR calc non Af Amer 23 (*)    GFR calc Af Amer 27 (*)    All other components within normal limits  CBC - Abnormal; Notable for the following:    RBC 2.53 (*)    Hemoglobin 8.0 (*)    HCT 22.9 (*)    Platelets 117 (*)    All other  components within normal limits  GLUCOSE, CAPILLARY - Abnormal; Notable for the following:    Glucose-Capillary 120 (*)    All other components within normal limits  GLUCOSE, CAPILLARY - Abnormal; Notable for the following:    Glucose-Capillary 147 (*)    All other components within normal limits  GLUCOSE, CAPILLARY - Abnormal; Notable for the following:    Glucose-Capillary 146 (*)    All other components within normal limits  GLUCOSE, CAPILLARY - Abnormal; Notable for the following:    Glucose-Capillary 172 (*)    All other components within normal limits  GLUCOSE, CAPILLARY - Abnormal; Notable for the following:    Glucose-Capillary 130 (*)    All other components within normal limits  CANCER ANTIGEN 19-9 - Abnormal; Notable  for the following:    CA 19-9 125 (*)    All other components within normal limits  GLUCOSE, CAPILLARY - Abnormal; Notable for the following:    Glucose-Capillary 141 (*)    All other components within normal limits  BASIC METABOLIC PANEL - Abnormal; Notable for the following:    Sodium 128 (*)    Chloride 99 (*)    CO2 21 (*)    Glucose, Bld 157 (*)    BUN 44 (*)    Creatinine, Ser 2.07 (*)    Calcium 8.7 (*)    GFR calc non Af Amer 27 (*)    GFR calc Af Amer 31 (*)    All other components within normal limits  CBC - Abnormal; Notable for the following:    WBC 10.7 (*)    RBC 2.93 (*)    Hemoglobin 9.0 (*)    HCT 26.8 (*)    All other components within normal limits  GLUCOSE, CAPILLARY - Abnormal; Notable for the following:    Glucose-Capillary 132 (*)    All other components within normal limits  GLUCOSE, CAPILLARY - Abnormal; Notable for the following:    Glucose-Capillary 112 (*)    All other components within normal limits  GLUCOSE, CAPILLARY - Abnormal; Notable for the following:    Glucose-Capillary 154 (*)    All other components within normal limits  CBC - Abnormal; Notable for the following:    RBC 2.36 (*)    Hemoglobin 7.5 (*)     HCT 21.4 (*)    Platelets 130 (*)    All other components within normal limits  COMPREHENSIVE METABOLIC PANEL - Abnormal; Notable for the following:    Sodium 133 (*)    CO2 20 (*)    Glucose, Bld 101 (*)    BUN 44 (*)    Creatinine, Ser 1.99 (*)    Calcium 8.4 (*)    Total Protein 5.8 (*)    Albumin 2.7 (*)    AST 70 (*)    ALT 74 (*)    Alkaline Phosphatase 208 (*)    Total Bilirubin 2.3 (*)    GFR calc non Af Amer 28 (*)    GFR calc Af Amer 33 (*)    All other components within normal limits  GLUCOSE, CAPILLARY - Abnormal; Notable for the following:    Glucose-Capillary 125 (*)    All other components within normal limits  GLUCOSE, CAPILLARY - Abnormal; Notable for the following:    Glucose-Capillary 111 (*)    All other components within normal limits  GLUCOSE, CAPILLARY - Abnormal; Notable for the following:    Glucose-Capillary 104 (*)    All other components within normal limits  GLUCOSE, CAPILLARY - Abnormal; Notable for the following:    Glucose-Capillary 118 (*)    All other components within normal limits  TSH  UREA NITROGEN, URINE  CREATININE, URINE, RANDOM  URIC ACID, RANDOM URINE  URIC ACID  NA AND K (SODIUM & POTASSIUM), RAND UR  PROTIME-INR  APTT  TYPE AND SCREEN  PREPARE RBC (CROSSMATCH)  ABO/RH   EKG  EKG Interpretation None      Radiology No results found.  Procedures Procedures   Medications Ordered in ED Medications - No data to display  Initial Impression / Assessment and Plan / ED Course  I have reviewed the triage vital signs and the nursing notes.  Pertinent labs & imaging results that were available during my care of the patient  were reviewed by me and considered in my medical decision making (see chart for details).      Final Clinical Impressions(s) / ED Diagnoses   Final diagnoses:  Hyponatremia  Nausea  Weight loss  Hyperbilirubinemia  Liver mass   New Prescriptions New Prescriptions   No medications on file    I personally preformed the services scribed in my presence. The recorded information has been reviewed is accurate. Virgel Manifold, MD.     Virgel Manifold, MD 06/24/17 1006

## 2017-06-15 NOTE — Telephone Encounter (Signed)
Called to inform pt to go to the ED for further evaluation. Pt understood.

## 2017-06-15 NOTE — Telephone Encounter (Signed)
CRITICAL LAB- Sodium 119.4

## 2017-06-15 NOTE — Telephone Encounter (Signed)
Based on all of the symptoms he is currently having I think it would be prudent that he go to the Emergency Room for further evaluation.

## 2017-06-15 NOTE — ED Notes (Signed)
Dr. Loleta Books explained tests results , admission and plan of care to pt. and family , oral contrast given to pt. by radiology tech.

## 2017-06-15 NOTE — Progress Notes (Signed)
NURSING PROGRESS NOTE  Gerald Jacobs [id number] Admission Data: 06/15/2017 10:40 PM Attending Provider: Edwin Jacobs, * HEN:[id number], Gerald Specter, FNP Code Status: Full  Allergies:  Colchicine; Flexeril [cyclobenzaprine hcl]; Levaquin [levofloxacin in d5w]; Percocet [oxycodone-acetaminophen]; and Zebeta Past Medical History:   has a past medical history of Cancer ([place]); Chronic anemia; Coronary artery disease; Diabetes mellitus; Essential hypertension; GERD (gastroesophageal reflux disease); History of gout; Mild renal insufficiency; Nocturia; and Osteoarthritis. Past Surgical History:   has a past surgical history that includes Knee surgery (Right, 1979); Prostate surgery (1993); melanoma surgery (1970's); ERCP (N/A, 10/15/2013); sphincterotomy (10/15/2013); Cholecystectomy (02/04/2014); and Cholecystectomy (N/A, 02/04/2014). Social History:   reports that he quit smoking about 37 years ago. His smoking use included Cigarettes. He has a 45.00 pack-year smoking history. He has never used smokeless tobacco. He reports that he drinks alcohol. He reports that he does not use drugs.  Gerald Jacobs is a 83 y.o. male patient admitted from ED:   Last Documented Vital Signs: Blood pressure (!) 166/73, pulse 62, temperature 98 F (36.7 C), temperature source Oral, resp. rate 18, height 5\' 7"  (1.702 m), weight 64.1 kg (141 lb 6.4 oz), SpO2 100 %.  IV Fluids:  IV in place, occlusive dsg intact without redness, IV cath forearm right, condition patent and no redness none.   Skin: Appropriate for ethnicity and intact.  Patient orientated to room. Information packet given to patient. Admission inpatient armband information verified with patient to include name and date of birth and placed on patient arm. Side rails up x 2, fall assessment and education completed with patient. Patient able to verbalize understanding of risk associated with falls and verbalized understanding to call for assistance before  getting out of bed. Call light within reach. Patient able to voice and demonstrate understanding of unit orientation instructions.    Will continue to evaluate and treat per MD orders.  Clydell Hakim RN, BSN

## 2017-06-15 NOTE — ED Triage Notes (Signed)
Pt presents with c/o abnormal sodium level. He had labs drawn in primary care office today in preparation for an abdominal ct scan tomorrow. He was called by nursing staff and instructed to come to emergency department for further evaluation of low sodium on lab work today. He reports recent nausea and fatigue, which was the reason for CT order.

## 2017-06-15 NOTE — Progress Notes (Signed)
Attempted to get report, left number with ED RN and was told he would call back.

## 2017-06-15 NOTE — H&P (Signed)
History and Physical  Patient Name: Gerald Jacobs     OJJ:[id number]    DOB: Oct 26, 1925    DOA: 06/15/2017 PCP: Golden Circle, FNP  Patient coming from: Home  Chief Complaint: Abnormal lab      HPI: Gerald Jacobs is a 83 y.o. male with a past medical history significant for NIDDM, CKD IV baseline Cr 2.2, HTN, CAD and anemia of CKD who presents with abnormal sodium.  Over the last month and a half, the patient has had worsening weakness, decreased appetite, nausea, dizziness. He has been worked up by his PCP, who suspected that this was from anemia, and had referred him for EGD and colonoscopy which are pending. Last week, a routine CMP and CBC showed new hyponatremia to 122 mmol/L (from baseline ~140) and progression of his anemia from near 12 g/dL in April to 8.5 g/dL now.  Then today he had a repeat sodium level at 119 mmol/L and so he was called to come to the ER.  There has been no new LOC, seizure, confusion.   This is all in the context of about 6 months of progressive failure to thrive: mild weight loss (about 5-10 lbs in last year per chart), decreased energy (no longer able to play golf), no appetite, nausea, and progressive generalized weakness and fatigue.  ED course: -Afebrile, heart rate 66, respirations and pulse is normal, blood pressure 178/70 -Na 122, K 4.5, Cr 1.9 (baseline 2.2-2.5), WBC 7.2 K, Hgb 8.5 and normocytic -Total bilirubin 3.1, alk phos only 160 -Urinalysis without hematuria or pyuria -TRH was asked to evaluate for hyponatremia and weakness     ROS: Review of Systems  Constitutional: Positive for malaise/fatigue and weight loss. Negative for chills and fever.  Respiratory: Negative for cough and shortness of breath.   Cardiovascular: Negative for chest pain.  Gastrointestinal: Positive for nausea. Negative for abdominal pain, blood in stool (although FOBT+), constipation, diarrhea, melena and vomiting.  Neurological: Positive for dizziness and  weakness.  All other systems reviewed and are negative.         Past Medical History:  Diagnosis Date  . Cancer [facility])    prostate  . Chronic anemia    With normal iron studies and normal serum protein electrophoresis  . Coronary artery disease    Mild  . Diabetes mellitus    Adult onset  . Essential hypertension   . GERD (gastroesophageal reflux disease)    occ tums  . History of gout   . Mild renal insufficiency   . Nocturia    x2  . Osteoarthritis     Past Surgical History:  Procedure Laterality Date  . CHOLECYSTECTOMY  02/04/2014   DR Zella Richer  . CHOLECYSTECTOMY N/A 02/04/2014   Procedure: LAPAROSCOPIC CHOLECYSTECTOMY with intraoperative cholangiogram;  Surgeon: Odis Hollingshead, MD;  Location: [facility];  Service: General;  Laterality: N/A;  . ERCP N/A 10/15/2013   Procedure: ENDOSCOPIC RETROGRADE CHOLANGIOPANCREATOGRAPHY (ERCP);  Surgeon: Jeryl Columbia, MD;  Location: [facility];  Service: Endoscopy;  Laterality: N/A;  . KNEE SURGERY Right 1979  . melanoma surgery  1970's   on scalp  . PROSTATE SURGERY  1993   Prostate Cancer  . SPHINCTEROTOMY  10/15/2013   Procedure: SPHINCTEROTOMY;  Surgeon: Jeryl Columbia, MD;  Location: [facility];  Service: Endoscopy;;    Social History: Patient lives with his wife.  The patient walks unassisted.  He is from [place] originally.  He worked for [employer] as a Engineer, materials  analyst.  Former smoker for 30 yrs, quit 30 years ago.  No alcohol in last 6 months.  Allergies  Allergen Reactions  . Colchicine Other (See Comments)    GI problems  . Flexeril [Cyclobenzaprine Hcl] Other (See Comments)    GI problems   . Levaquin [Levofloxacin In D5w] Other (See Comments)    insomnia  . Percocet [Oxycodone-Acetaminophen] Nausea And Vomiting  . Zebeta Other (See Comments)    Gi problems    Family history: family history includes Stroke in his brother, brother, brother, father, mother, and sister.  Prior to Admission medications     Medication Sig Start Date End Date Taking? Authorizing Provider  allopurinol (ZYLOPRIM) 100 MG tablet TAKE 1 TABLET (100 MG TOTAL) BY MOUTH DAILY. 03/15/17   Golden Circle, FNP  amLODipine (NORVASC) 2.5 MG tablet Take 1 tablet (2.5 mg total) by mouth daily. 03/15/17 06/13/17  Nahser, Wonda Cheng, MD  aspirin 81 MG tablet Take 81 mg by mouth daily.      [provider]  Biotin 1000 MCG tablet Take 1,000 mcg by mouth daily.    [provider]  diphenhydramine-acetaminophen (TYLENOL PM) 25-500 MG TABS Take 1 tablet by mouth at bedtime as needed (sleep).    [provider]  glucose blood test strip Use one strip per test. Check blood sugars 1-4 times per day as instructed. 01/18/16   Golden Circle, FNP  L-Lysine 500 MG CAPS Take 500 mg by mouth 2 (two) times daily.    [provider]  losartan-hydrochlorothiazide (HYZAAR) 100-12.5 MG tablet TAKE 1/2 TABLET BY MOUTH DAILY 03/09/17   Burtis Junes, NP  metoprolol succinate (TOPROL-XL) 50 MG 24 hr tablet Take 1 tablet by mouth every morning. 01/04/17   [provider]  Omega-3 Fatty Acids (FISH OIL PO) Take 1 capsule by mouth daily.    [provider]  [facility] DELICA LANCETS 66A MISC TEST BLOOD SUGARS 1-4 TIMES A DAY AS INSTRUCTED. DX CODE: E11.9 03/29/17   Golden Circle, FNP  saxagliptin HCl (ONGLYZA) 2.5 MG TABS tablet Take 2.5 mg by mouth daily.    [provider]       Physical Exam: BP (!) 181/70   Pulse 65   Temp 98 F (36.7 C) (Oral)   Resp 13   SpO2 100%  General appearance: Thin elderly  adult male, alert and in no acute distress.   Eyes: Slightly icteric, conjunctiva pink, lids and lashes normal. Right pupil subtly larger than left, both reactive.    ENT: No nasal deformity, discharge, epistaxis.  Hearing normal. OP moist without lesions.  Upper dentures. Neck: No neck masses.  Trachea midline.  No thyromegaly/tenderness. Lymph: No cervical or supraclavicular  lymphadenopathy. Skin: Warm and dry.  Mild jaundice.  No suspicious rashes or lesions. Cardiac: RRR, nl S1-S2, no murmurs appreciated.  Capillary refill is brisk.  JVP normal.  No LE edema.  Radial and DP pulses 2+ and symmetric. Respiratory: Normal respiratory rate and rhythm.  CTAB without rales or wheezes. Abdomen: Abdomen soft.  No TTP. No ascites, distension, hepatosplenomegaly.   MSK: No deformities or effusions.  No cyanosis or clubbing. Neuro: Cranial nerves 3-12 intact.  Sensation intact to light touch. Speech is fluent.  Muscle strength 5-/5 bilaterally.    Psych: Sensorium intact and responding to questions, attention normal.  Behavior appropriate.  Affect blunted.  Judgment and insight appear normal.     Labs on Admission:  I have personally reviewed following labs  and imaging studies: CBC:  Recent Labs Lab 06/13/17 1022 06/15/17 1656  WBC 7.4 7.2  HGB 9.5* 8.5*  HCT 27.5* 25.0*  MCV 95.9 91.6  PLT 143.0* 102*   Basic Metabolic Panel:  Recent Labs Lab 06/13/17 1022 06/15/17 1444 06/15/17 1656  NA 122* 119* 122*  K 4.7 4.5 4.7  CL 91* 92* 95*  CO2 22 21 20*  GLUCOSE 184* 140* 118*  BUN 27* 29* 27*  CREATININE 1.89* 1.80* 1.92*  CALCIUM 9.0 8.6 8.7*  PHOS 3.3 3.9  --    GFR: Estimated Creatinine Clearance: 23.5 mL/min (A) (by C-G formula based on SCr of 1.92 mg/dL (H)).  Liver Function Tests:  Recent Labs Lab 06/13/17 1022 06/15/17 1444 06/15/17 1656  AST 43*  --  50*  ALT 56*  --  61  ALKPHOS 171*  --  160*  BILITOT 2.4*  --  3.1*  PROT 6.8  --  6.8  ALBUMIN 3.7  3.7 3.5 3.4*    Recent Labs Lab 06/13/17 1022  LIPASE 65.0*  AMYLASE 48   Anemia Panel:  Recent Labs  06/13/17 1022  FERRITIN 208.7  IRON 162    Recent Results (from the past 585 hour(s))  Helicobacter pylori special antigen     Status: None   Collection Time: 06/13/17  3:05 PM  Result Value Ref Range Status   H. PYLORI Antigen  NOT DETECTED  Final   H. PYLORI  Antigen  Antimicrobials,proton pump inhibitors and bismuth  Final   H. PYLORI Antigen    Final    preparations are known to suppress H.pylori and ingestion of   H. PYLORI Antigen    Final    these prior to testing may cause a false negative result. If   H. PYLORI Antigen    Final    a negative result is obtained for a patient that has    H. PYLORI Antigen  ingested these compounds within two weeks prior of  Final   H. PYLORI Antigen    Final    performing the H.pylori test, results may be falsely   H. PYLORI Antigen    Final    negative and should be repeated with a new specimen two   H. PYLORI Antigen  weeks after discontinuing treatment.  Final             Assessment/Plan  1. Hyponatremia:  New problem, etiology unclear. Euvolemic.  Asymptomatic chronic hyponatremia. -1.5 L fluid restriction for now until urine studies return. -Check urine sodium, osmolality ratio -Check TSH -Check orthostatics -Trend higher dose -Hold HCTZ -Trend sodium  -Check CT chest   2. Hyperbilirubinemia:  New problem, diagnostic considerations include malignancy, hemolysis, medication cholestasis, benign indirect hyperbilirubinemia. -Fractionate bilirubin -Trend LFTs -Given weight loss, and chronic failure to thrive, will Obtain CT abdomen pelvis with oral contrast  3. Chronic kidney disease stage IV:  Stable  4. Anemia, normocytic:  From chronic kidney disease.  Was on EPO before.   -Follow up with Dr. Posey Pronto, Nephrolgoy  5. Type 2 diabetes, non-insulin-dependent:  Euglycemic. -Hold orals -SSI with meals, low dose  6. Hypertension, CV disease secondary prevention:  Hypertensive at admission -Continue losartan -Increase amlodipine -Increase metoprolol -Continue aspirin  7. Other medications:  -Continue allopurinol     DVT prophylaxis: Lovenox, low dose Code Status: FULL  Family Communication: Wife and son at bedside  Disposition Plan: Anticipate urine studies to evaluate  hyponatremia, fluid restriction.  For bilirubin, obtain imaging and trend Tbili.  Consults called: None Admission status: INPATIENT    Medical decision making: Patient seen at 7:30 PM on 06/15/2017.  The patient was discussed with Dr. Wilson Singer.  What exists of the patient's chart was reviewed in depth and summarized above.  Clinical condition: stable.        Edwin Dada [facility] Pager [phone]       At the time of admission, it appears that the appropriate admission status for this patient is INPATIENT. This is judged to be reasonable and necessary in order to provide the required intensity of service to ensure the patient's safety given the presenting symptoms, physical exam findings, and initial radiographic and laboratory data in the context of their chronic comorbidities.  Together, these circumstances are felt to place him at high risk for further clinical deterioration threatening life, limb, or organ.    I certify that at the point of admission it is my clinical judgment that the patient will require inpatient hospital care spanning beyond 2 midnights from the point of admission and that early discharge would result in unnecessary risk of decompensation and readmission or threat to life, limb or bodily function.

## 2017-06-15 NOTE — Telephone Encounter (Signed)
Informed Pt to come in today and get labs done, will be coming in at 3 today.

## 2017-06-15 NOTE — Progress Notes (Signed)
Received report from ED RN, Herbie Baltimore.

## 2017-06-15 NOTE — ED Notes (Signed)
Admitting at bedside 

## 2017-06-16 ENCOUNTER — Inpatient Hospital Stay (HOSPITAL_COMMUNITY): Payer: Medicare Other

## 2017-06-16 ENCOUNTER — Inpatient Hospital Stay: Admission: RE | Admit: 2017-06-16 | Payer: Medicare Other | Source: Ambulatory Visit

## 2017-06-16 DIAGNOSIS — R634 Abnormal weight loss: Secondary | ICD-10-CM

## 2017-06-16 DIAGNOSIS — R195 Other fecal abnormalities: Secondary | ICD-10-CM

## 2017-06-16 DIAGNOSIS — R110 Nausea: Secondary | ICD-10-CM

## 2017-06-16 DIAGNOSIS — R932 Abnormal findings on diagnostic imaging of liver and biliary tract: Secondary | ICD-10-CM

## 2017-06-16 DIAGNOSIS — R5383 Other fatigue: Secondary | ICD-10-CM

## 2017-06-16 LAB — CBC
HCT: 24.3 % — ABNORMAL LOW (ref 39.0–52.0)
Hemoglobin: 8.2 g/dL — ABNORMAL LOW (ref 13.0–17.0)
MCH: 30.6 pg (ref 26.0–34.0)
MCHC: 33.7 g/dL (ref 30.0–36.0)
MCV: 90.7 fL (ref 78.0–100.0)
PLATELETS: 121 10*3/uL — AB (ref 150–400)
RBC: 2.68 MIL/uL — ABNORMAL LOW (ref 4.22–5.81)
RDW: 13.6 % (ref 11.5–15.5)
WBC: 6.9 10*3/uL (ref 4.0–10.5)

## 2017-06-16 LAB — URIC ACID, RANDOM URINE: Uric Acid, Urine: 16.1 mg/dL

## 2017-06-16 LAB — COMPREHENSIVE METABOLIC PANEL
ALBUMIN: 2.9 g/dL — AB (ref 3.5–5.0)
ALK PHOS: 150 U/L — AB (ref 38–126)
ALT: 54 U/L (ref 17–63)
ANION GAP: 8 (ref 5–15)
AST: 48 U/L — ABNORMAL HIGH (ref 15–41)
BILIRUBIN TOTAL: 3.2 mg/dL — AB (ref 0.3–1.2)
BUN: 28 mg/dL — ABNORMAL HIGH (ref 6–20)
CALCIUM: 8.6 mg/dL — AB (ref 8.9–10.3)
CO2: 20 mmol/L — ABNORMAL LOW (ref 22–32)
CREATININE: 1.83 mg/dL — AB (ref 0.61–1.24)
Chloride: 96 mmol/L — ABNORMAL LOW (ref 101–111)
GFR calc non Af Amer: 31 mL/min — ABNORMAL LOW (ref >60)
GFR, EST AFRICAN AMERICAN: 36 mL/min — AB (ref >60)
GLUCOSE: 112 mg/dL — AB (ref 65–99)
Potassium: 4.3 mmol/L (ref 3.5–5.1)
Sodium: 124 mmol/L — ABNORMAL LOW (ref 135–145)
TOTAL PROTEIN: 6.2 g/dL — AB (ref 6.5–8.1)

## 2017-06-16 LAB — BASIC METABOLIC PANEL
ANION GAP: 9 (ref 5–15)
BUN: 27 mg/dL — ABNORMAL HIGH (ref 6–20)
CO2: 21 mmol/L — ABNORMAL LOW (ref 22–32)
Calcium: 8.5 mg/dL — ABNORMAL LOW (ref 8.9–10.3)
Chloride: 92 mmol/L — ABNORMAL LOW (ref 101–111)
Creatinine, Ser: 1.79 mg/dL — ABNORMAL HIGH (ref 0.61–1.24)
GFR calc Af Amer: 37 mL/min — ABNORMAL LOW (ref >60)
GFR, EST NON AFRICAN AMERICAN: 32 mL/min — AB (ref >60)
Glucose, Bld: 158 mg/dL — ABNORMAL HIGH (ref 65–99)
POTASSIUM: 4.6 mmol/L (ref 3.5–5.1)
SODIUM: 122 mmol/L — AB (ref 135–145)

## 2017-06-16 LAB — GLUCOSE, CAPILLARY
GLUCOSE-CAPILLARY: 119 mg/dL — AB (ref 65–99)
GLUCOSE-CAPILLARY: 164 mg/dL — AB (ref 65–99)
GLUCOSE-CAPILLARY: 111 mg/dL — AB (ref 65–99)
Glucose-Capillary: 106 mg/dL — ABNORMAL HIGH (ref 65–99)

## 2017-06-16 LAB — BILIRUBIN, FRACTIONATED(TOT/DIR/INDIR)
BILIRUBIN TOTAL: 3 mg/dL — AB (ref 0.3–1.2)
Bilirubin, Direct: 2.1 mg/dL — ABNORMAL HIGH (ref 0.1–0.5)
Indirect Bilirubin: 0.9 mg/dL (ref 0.3–0.9)

## 2017-06-16 LAB — UREA NITROGEN, URINE: Urea Nitrogen, Ur: 209 mg/dL

## 2017-06-16 MED ORDER — GLUCERNA SHAKE PO LIQD
237.0000 mL | Freq: Two times a day (BID) | ORAL | Status: DC
  Administered 2017-06-16 – 2017-06-20 (×4): 237 mL via ORAL

## 2017-06-16 NOTE — Consult Note (Signed)
Referring Provider: [facility]   Primary Care Physician:  Golden Circle, FNP Primary Gastroenterologist:   Scarlette Shorts,   MD  Reason for Consultation:  Elevated bilirubin  ASSESSMENT AND PLAN:    # 83 yo male with mild weight loss, fatigue, nausea and new hyperbilirubinemia, predominantly direct.  He has a history of left sided intrahepatic ductal dilation possibly from stones found on MRCP in 2014. He is s/p ERCP with removal of sludge from CBD and sphincterotomy. No distal intrahepatic duct strictures appreciated at the time. Subsequently underwent lap chole with IOC. There was an irregularity / filing defect in central left intrahepatic duct without obstruction which was less prominent with a higher balloon occlusion injection but subtle stricture or intraluminal material couldn't be excluded.     -MRCP without contrast -am CMET  #  Worsening chronic normocytic anemia. Baseline 10-11, slow decline over the last month to 8.2 today. Patient was getting iron infusions and Procrit but hasn't gotten neither since at least January which is likely contributing to progressive anemia. His stool is heme positive however. He is already scheduled for outpatient EGD / colonoscopy with [place]. Depending on clinical course he may need to have procedures done inpatient.  -consider dose of procrit or IV iron this admission   # Hyponatremia, new. Na+ 119, improved to 124. Workup in progress.   # Thrombocytopenia, progressive since March (181 >>>140 >>>120)  # Hx of H.pylori. Apparently treated with Prevpac 2014. H.pylori ag negative 06/13/17.   # CKD 4, at baseline  HPI: Gerald Jacobs is a 83 y.o. male with DM, CAD, HTN, and CKD IV evaluated in our office 10 days ago for nausea, anemia and heme positive stools. No overt bleeding. No abdominal pain. His anemia is chronic and previously treated with iron infusions and Procrit. Patient hasn't had iron nor procrit since January. He was scheduled for  EGD and colonoscopy.   Patient saw PCP for fatigue and nausea. His has lost about 10 pounds lately, appetite poor. Endorse generalized itching. Sent to ED by PCP yesterday due to low sodium. Non-contrast CT scan reveals atrophy of left hepatic lobe with chronic moderate intrahepatic ductal dilatation slightly increased in prominence since prior exam. Right hepatic granuloma noted. Labs this admission remarkable for elevated alk phos and bili. His alk phos has been elevated for over a year and is actually improved. His bili is ~ 3, predominantly direct, minimally elevated AST. Patient had left intrahepatic duct dilation on ultrasound and MRI in 2014. Bilirubin was normal at that time. He underwent ERCP with removal of CBD sludge and a median sphincterotomy by [facility].  Normal ampulla. No obvious distal intrahepatic stricture. He later had a cholecystectomy with cholangiogram and irregularity / defect seen in central left intrahepatic duct.   Past Medical History:  Diagnosis Date  . Cancer [facility])    prostate  . Chronic anemia    With normal iron studies and normal serum protein electrophoresis  . Coronary artery disease    Mild  . Diabetes mellitus    Adult onset  . Essential hypertension   . GERD (gastroesophageal reflux disease)    occ tums  . History of gout   . Mild renal insufficiency   . Nocturia    x2  . Osteoarthritis     Past Surgical History:  Procedure Laterality Date  . CHOLECYSTECTOMY  02/04/2014   DR Zella Richer  . CHOLECYSTECTOMY N/A 02/04/2014   Procedure: LAPAROSCOPIC CHOLECYSTECTOMY with intraoperative cholangiogram;  Surgeon: Odis Hollingshead, MD;  Location: [place];  Service: General;  Laterality: N/A;  . ERCP N/A 10/15/2013   Procedure: ENDOSCOPIC RETROGRADE CHOLANGIOPANCREATOGRAPHY (ERCP);  Surgeon: Jeryl Columbia, MD;  Location: [facility];  Service: Endoscopy;  Laterality: N/A;  . KNEE SURGERY Right 1979  . melanoma surgery  1970's   on scalp  . PROSTATE SURGERY   1993   Prostate Cancer  . SPHINCTEROTOMY  10/15/2013   Procedure: SPHINCTEROTOMY;  Surgeon: Jeryl Columbia, MD;  Location: [facility];  Service: Endoscopy;;    Prior to Admission medications   Medication Sig Start Date End Date Taking? Authorizing Provider  allopurinol (ZYLOPRIM) 100 MG tablet TAKE 1 TABLET (100 MG TOTAL) BY MOUTH DAILY. 03/15/17  Yes Golden Circle, FNP  amLODipine (NORVASC) 2.5 MG tablet Take 1 tablet (2.5 mg total) by mouth daily. Patient taking differently: Take 2.5 mg by mouth at bedtime.  03/15/17 06/15/17 Yes Nahser, Wonda Cheng, MD  Biotin 1000 MCG tablet Take 1,000 mcg by mouth daily.   Yes [provider]  calcium carbonate (TUMS - DOSED IN MG ELEMENTAL CALCIUM) 500 MG chewable tablet Chew 1 tablet by mouth 2 (two) times daily as needed for indigestion or heartburn.   Yes [provider]  diphenhydramine-acetaminophen (TYLENOL PM) 25-500 MG TABS Take 1 tablet by mouth at bedtime as needed (sleep).   Yes [provider]  L-Lysine 500 MG CAPS Take 500 mg by mouth 2 (two) times daily.   Yes [provider]  losartan-hydrochlorothiazide (HYZAAR) 100-12.5 MG tablet TAKE 1/2 TABLET BY MOUTH DAILY 03/09/17  Yes Burtis Junes, NP  metoprolol succinate (TOPROL-XL) 50 MG 24 hr tablet Take 50 mg by mouth every morning.  01/04/17  Yes [provider]  Omega-3 Fatty Acids (FISH OIL PO) Take 1 capsule by mouth daily.   Yes [provider]  saxagliptin HCl (ONGLYZA) 2.5 MG TABS tablet Take 2.5 mg by mouth daily.   Yes [provider]  aspirin 81 MG tablet Take 81 mg by mouth daily.      [provider]  glucose blood test strip Use one strip per test. Check blood sugars 1-4 times per day as instructed. 01/18/16   Golden Circle, FNP  ONETOUCH DELICA LANCETS 82X MISC TEST BLOOD SUGARS 1-4 TIMES A DAY AS INSTRUCTED. DX CODE: E11.9 03/29/17   Golden Circle, FNP    Current Facility-Administered Medications   Medication Dose Route Frequency Provider Last Rate Last Dose  . acetaminophen (TYLENOL) tablet 650 mg  650 mg Oral Q6H PRN Danford, Suann Larry, MD       Or  . acetaminophen (TYLENOL) suppository 650 mg  650 mg Rectal Q6H PRN Danford, Suann Larry, MD      . allopurinol (ZYLOPRIM) tablet 100 mg  100 mg Oral Daily Danford, Suann Larry, MD   100 mg at 06/16/17 1002  . alum & mag hydroxide-simeth (MAALOX/MYLANTA) 200-200-20 MG/5ML suspension 15 mL  15 mL Oral Q6H PRN Edwin Dada, MD   15 mL at 06/16/17 1207  . amLODipine (NORVASC) tablet 10 mg  10 mg Oral QHS Edwin Dada, MD   10 mg at 06/15/17 2301  . feeding supplement (GLUCERNA SHAKE) (GLUCERNA SHAKE) liquid 237 mL  237 mL Oral BID BM Robbie Lis, MD      . insulin aspart (novoLOG) injection 0-5 Units  0-5 Units Subcutaneous QHS Danford, Suann Larry, MD      . insulin aspart (novoLOG) injection  0-9 Units  0-9 Units Subcutaneous TID WC Danford, Suann Larry, MD      . metoprolol succinate (TOPROL-XL) 24 hr tablet 100 mg  100 mg Oral q morning - 10a Danford, Suann Larry, MD   100 mg at 06/16/17 1001  . ondansetron (ZOFRAN) tablet 4 mg  4 mg Oral Q6H PRN Danford, Suann Larry, MD       Or  . ondansetron (ZOFRAN) injection 4 mg  4 mg Intravenous Q6H PRN Edwin Dada, MD        Allergies as of 06/15/2017 - Review Complete 06/15/2017  Allergen Reaction Noted  . Colchicine Other (See Comments) 01/18/2012  . Flexeril [cyclobenzaprine hcl] Other (See Comments) 09/06/2011  . Levaquin [levofloxacin in d5w] Other (See Comments) 10/28/2016  . Percocet [oxycodone-acetaminophen] Nausea And Vomiting 01/16/2014  . Zebeta Other (See Comments) 09/06/2011    Family History  Problem Relation Age of Onset  . Stroke Father   . Stroke Mother   . Stroke Sister   . Stroke Brother   . Stroke Brother   . Stroke Brother     Social History   Social History  . Marital status: Married    Spouse name: N/A  .  Number of children: 2  . Years of education: 14   Occupational History  . Not on file.   Social History Main Topics  . Smoking status: Former Smoker    Packs/day: 1.00    Years: 45.00    Types: Cigarettes    Quit date: 09/07/1979  . Smokeless tobacco: Never Used  . Alcohol use Yes     Comment: beer rarely  . Drug use: No  . Sexual activity: Not on file   Other Topics Concern  . Not on file   Social History Narrative   Fun/Hobby: Playing golf when he can.     Review of Systems: All systems reviewed and negative except where noted in HPI.  Physical Exam: Vital signs in last 24 hours: Temp:  [98 F (36.7 C)-98.5 F (36.9 C)] 98.4 F (36.9 C) (07/20 1406) Pulse Rate:  [61-66] 61 (07/20 1406) Resp:  [13-20] 16 (07/20 1406) BP: (140-182)/(57-73) 149/57 (07/20 1406) SpO2:  [100 %] 100 % (07/20 1406) Weight:  [141 lb 6.4 oz (64.1 kg)] 141 lb 6.4 oz (64.1 kg) (07/19 2120) Last BM Date: 06/16/17 General:   Alert, well-developed, white male in NAD Psych:  Pleasant, cooperative. Normal mood and affect. Eyes:  Pupils equal,  Conjunctiva pink. Ears:  Normal auditory acuity. Nose:  No deformity, discharge,  or lesions. Neck:  Supple; no masses Lungs:  Clear throughout to auscultation.   No wheezes, crackles, or rhonchi.  Heart:  Regular rate and rhythm; no murmurs, no edema Abdomen:  Soft, non-distended, nontender, BS active, no palp mass    Rectal:  Deferred  Msk:  Symmetrical without gross deformities. . Pulses:  Normal pulses noted. Neurologic:  Alert and  oriented x4;  grossly normal neurologically. Skin:  Intact without significant lesions or rashes..   Intake/Output from previous day: 07/19 0701 - 07/20 0700 In: -  Out: 825 [Urine:825] Intake/Output this shift: Total I/O In: 360 [P.O.:360] Out: 250 [Urine:250]  Lab Results:  Recent Labs  06/15/17 1656 06/16/17 0426  WBC 7.2 6.9  HGB 8.5* 8.2*  HCT 25.0* 24.3*  PLT 140* 121*   BMET  Recent Labs   06/15/17 1444 06/15/17 1656 06/16/17 0426  NA 119* 122* 124*  K 4.5 4.7 4.3  CL 92* 95* 96*  CO2 21 20* 20*  GLUCOSE 140* 118* 112*  BUN 29* 27* 28*  CREATININE 1.80* 1.92* 1.83*  CALCIUM 8.6 8.7* 8.6*   LFT  Recent Labs  06/16/17 0426  PROT 6.2*  ALBUMIN 2.9*  AST 48*  ALT 54  ALKPHOS 150*  BILITOT 3.2*  3.0*  BILIDIR 2.1*  IBILI 0.9   PT/INR No results for input(s): LABPROT, INR in the last 72 hours. Hepatitis Panel No results for input(s): HEPBSAG, HCVAB, HEPAIGM, HEPBIGM in the last 72 hours.   Studies/Results: Ct Abdomen Pelvis Wo Contrast  Result Date: 06/15/2017 CLINICAL DATA:  Generalized weakness, loss of appetite and nausea. Chest pain for several months. History of hypertension, diabetes and renal insufficiency. EXAM: CT CHEST, ABDOMEN AND PELVIS WITHOUT CONTRAST TECHNIQUE: Multidetector CT imaging of the chest, abdomen and pelvis was performed following the standard protocol without IV contrast. COMPARISON:  CXR 01/18/2016, abdomen ultrasound 05/28/2013, chest CT 08/14/2008, MRI 07/23/2013 FINDINGS: CT CHEST FINDINGS Cardiovascular: The cardiac chambers are normal in size. No pericardial effusion. There is coronary arteriosclerosis along the LAD, circumflex and RCA. There is aortic atherosclerosis without aneurysm. The unenhanced pulmonary artery is unremarkable without main pulmonary arterial dilatation. Mediastinum/Nodes: No enlarged mediastinal, hilar, or axillary lymph nodes. Thyroid gland, trachea, and esophagus demonstrate no significant findings. A tiny 2 mm cystic nodule of the left thyroid gland is incidentally noted and stable dating back to 2009 consistent with a benign finding. Lungs/Pleura: Chronic stable apical pleuroparenchymal scarring, right greater than left with calcified and noncalcified pleural plaque or thickening along the posterior aspect of the right hemithorax. Fine interstitial changes along the periphery of the left lower lobe may represent  areas of minimal fibrosis with tiny subpleural blebs at each lung base. Similar findings in the medial right middle lobe. No effusion or pneumothorax. No dominant mass. Musculoskeletal: No chest wall mass or suspicious bone lesions identified. Degenerative changes are seen along the dorsal spine. CT ABDOMEN PELVIS FINDINGS Hepatobiliary: Status post cholecystectomy. Chronic atrophy of the left hepatic lobe is again noted with chronic moderately dilated intrahepatic ducts within the atrophic left hepatic lobe. Faint calcifications are seen within the peripheral dilated ducts suspicious for small intraductal calcifications. The degree of intrahepatic dilatation is somewhat more pronounced than on prior without definite etiology identified. The possibility of intrahepatic strictures are not entirely excluded. A granuloma is noted in the right hepatic lobe. Surgical clips are seen along the margin of the left hepatic lobe. Pancreas: Unremarkable. No pancreatic ductal dilatation or surrounding inflammatory changes. Spleen: Normal in size without focal abnormality. Adrenals/Urinary Tract: Right renal collecting system. No obstructive uropathy. 15 mm cyst in the upper pole of the left kidney. Further characterization not possible given the noncontrast study. Urinary bladder is physiologically distended. Stomach/Bowel: Stomach is within normal limits. Appendix appears normal. No evidence of bowel wall thickening, distention, or inflammatory changes. Vascular/Lymphatic: Aortic atherosclerosis. No enlarged abdominal or pelvic lymph nodes. The splenic and portal vein can't be adequately assessed due to the noncontrast study. Reproductive: Prostatectomy with pelvic clips in place. Other: No abdominal wall hernia or abnormality. No abdominopelvic ascites. Musculoskeletal: No acute nor suspicious osseous lesions. Thoracolumbar spondylosis. No evidence of blastic disease. IMPRESSION: 1. Three-vessel coronary arteriosclerosis. 2.  Aortoiliac atherosclerosis without aneurysm. 3. Atrophy of the left hepatic lobe with chronic moderate intrahepatic ductal dilatation slightly increased in prominence since prior exam without etiology. Small strictures are not entirely excluded. Consider repeat MRI of the abdomen. There are a few punctate peripheral calcifications which may represent  tiny stones within the or adjacent to the biliary system. Right hepatic granuloma is also noted. 4. Calcified and noncalcified pleural plaque along the posterior aspect of the right thorax. Fine interstitial prominence may reflect minimal fibrosis in the right middle lobe and left lower lobe with several small bibasilar pulmonary blebs noted. 5. Cholecystectomy. 6. Prostatectomy without evidence of osteoblastic disease. 7. 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 Probable 15 mm cyst left upper pole of the kidney. Electronically Signed   By: Ashley Royalty M.D.   On: 06/15/2017 22:57   Ct Chest Wo Contrast  Result Date: 06/15/2017 CLINICAL DATA:  Generalized weakness, loss of appetite and nausea. Chest pain for several months. History of hypertension, diabetes and renal insufficiency. EXAM: CT CHEST, ABDOMEN AND PELVIS WITHOUT CONTRAST TECHNIQUE: Multidetector CT imaging of the chest, abdomen and pelvis was performed following the standard protocol without IV contrast. COMPARISON:  CXR 01/18/2016, abdomen ultrasound 05/28/2013, chest CT 08/14/2008, MRI 07/23/2013 FINDINGS: CT CHEST FINDINGS Cardiovascular: The cardiac chambers are normal in size. No pericardial effusion. There is coronary arteriosclerosis along the LAD, circumflex and RCA. There is aortic atherosclerosis without aneurysm. The unenhanced pulmonary artery is unremarkable without main pulmonary arterial dilatation. Mediastinum/Nodes: No enlarged mediastinal, hilar, or axillary lymph nodes. Thyroid gland, trachea, and esophagus demonstrate no significant findings. A tiny 2 mm cystic  nodule of the left thyroid gland is incidentally noted and stable dating back to 2009 consistent with a benign finding. Lungs/Pleura: Chronic stable apical pleuroparenchymal scarring, right greater than left with calcified and noncalcified pleural plaque or thickening along the posterior aspect of the right hemithorax. Fine interstitial changes along the periphery of the left lower lobe may represent areas of minimal fibrosis with tiny subpleural blebs at each lung base. Similar findings in the medial right middle lobe. No effusion or pneumothorax. No dominant mass. Musculoskeletal: No chest wall mass or suspicious bone lesions identified. Degenerative changes are seen along the dorsal spine. CT ABDOMEN PELVIS FINDINGS Hepatobiliary: Status post cholecystectomy. Chronic atrophy of the left hepatic lobe is again noted with chronic moderately dilated intrahepatic ducts within the atrophic left hepatic lobe. Faint calcifications are seen within the peripheral dilated ducts suspicious for small intraductal calcifications. The degree of intrahepatic dilatation is somewhat more pronounced than on prior without definite etiology identified. The possibility of intrahepatic strictures are not entirely excluded. A granuloma is noted in the right hepatic lobe. Surgical clips are seen along the margin of the left hepatic lobe. Pancreas: Unremarkable. No pancreatic ductal dilatation or surrounding inflammatory changes. Spleen: Normal in size without focal abnormality. Adrenals/Urinary Tract: Right renal collecting system. No obstructive uropathy. 15 mm cyst in the upper pole of the left kidney. Further characterization not possible given the noncontrast study. Urinary bladder is physiologically distended. Stomach/Bowel: Stomach is within normal limits. Appendix appears normal. No evidence of bowel wall thickening, distention, or inflammatory changes. Vascular/Lymphatic: Aortic atherosclerosis. No enlarged abdominal or pelvic  lymph nodes. The splenic and portal vein can't be adequately assessed due to the noncontrast study. Reproductive: Prostatectomy with pelvic clips in place. Other: No abdominal wall hernia or abnormality. No abdominopelvic ascites. Musculoskeletal: No acute nor suspicious osseous lesions. Thoracolumbar spondylosis. No evidence of blastic disease. IMPRESSION: 1. Three-vessel coronary arteriosclerosis. 2. Aortoiliac atherosclerosis without aneurysm. 3. Atrophy of the left hepatic lobe with chronic moderate intrahepatic ductal dilatation slightly increased in prominence since prior exam without etiology. Small strictures are not entirely excluded. Consider repeat MRI of the abdomen. There are a few  punctate peripheral calcifications which may represent tiny stones within the or adjacent to the biliary system. Right hepatic granuloma is also noted. 4. Calcified and noncalcified pleural plaque along the posterior aspect of the right thorax. Fine interstitial prominence may reflect minimal fibrosis in the right middle lobe and left lower lobe with several small bibasilar pulmonary blebs noted. 5. Cholecystectomy. 6. Prostatectomy without evidence of osteoblastic disease. 7. 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 Probable 15 mm cyst left upper pole of the kidney. Electronically Signed   By: Ashley Royalty M.D.   On: 06/15/2017 22:57     Tye Savoy, NP-C @  06/16/2017, 2:28 PM  Pager number [phone]

## 2017-06-16 NOTE — Progress Notes (Addendum)
Patient ID: Gerald Jacobs, male   DOB: 1925-11-10, 83 y.o.   MRN: [id number]  PROGRESS NOTE    SUEO CULLEN  IFO:[id number] DOB: Jan 24, 1925 DOA: 06/15/2017  PCP: Golden Circle, FNP   Brief Narrative:  83 year old male with NIDDM, CKD stage 4 with baseline Cr 2.2, hypertensin who presented to ED with abnormal sodium. . Pt was hemodynamically stable. Sodium was 122, bilirubin was 3.1 and ALP 160.   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 disease, without long-term current use of insulin (HCC) - on SSI    CKD (chronic kidney disease), stage IV (HCC) - Cr within baseline range     Anemia of chronic disease - Hgb stable     Essential hypertension - Continue norvasc and metoprolol     Hyperbilirubinemia - Has had cholecystectomy in past - GI consulted     Thrombocytopenia - D/C Lovenox subQ - Platelets 121     DVT prophylaxis: SCD's  Code Status: full code  Family Communication: no family at the bedside  Disposition Plan: home once sodium level at least close to 130   Consultants:   GI  Procedures:   None   Antimicrobials:   None   Subjective: No overnight events.   Objective: Vitals:   06/15/17 1900 06/15/17 2000 06/15/17 2120 06/16/17 0555  BP: (!) 181/70 (!) 182/69 (!) 166/73 140/60  Pulse: 65 66 62 64  Resp: 13 20 18 16   Temp:    98.5 F (36.9 C)  TempSrc:    Oral  SpO2: 100% 100% 100% 100%  Weight:   64.1 kg (141 lb 6.4 oz)   Height:   5\' 7"  (1.702 m)     Intake/Output Summary (Last 24 hours) at 06/16/17 1220 Last data filed at 06/16/17 1200  Gross per 24 hour  Intake              240 ml  Output             1075 ml  Net             -835 ml     Filed Weights   06/15/17 2120  Weight: 64.1 kg (141 lb 6.4 oz)    Examination:  General exam: Appears calm and comfortable  Respiratory system: Clear to auscultation. Respiratory effort normal. Cardiovascular system: S1 & S2 heard, RRR.  Gastrointestinal system: Abdomen is nondistended, soft and nontender. No organomegaly or masses felt. Normal bowel sounds heard. Central nervous system: Alert and oriented. No focal neurological deficits. Extremities: Symmetric 5 x 5 power. Skin: No rashes, lesions or ulcers Psychiatry: Judgement and insight appear normal. Mood & affect appropriate.   Data Reviewed: I have personally reviewed following labs and imaging studies  CBC:  Recent Labs Lab 06/13/17 1022 06/15/17 1656 06/16/17 0426  WBC 7.4 7.2 6.9  HGB 9.5* 8.5* 8.2*  HCT 27.5* 25.0* 24.3*  MCV 95.9 91.6 90.7  PLT 143.0* 140* 676*   Basic Metabolic Panel:  Recent Labs Lab 06/13/17 1022 06/15/17 1444 06/15/17 1656 06/16/17 0426  NA 122* 119* 122* 124*  K 4.7 4.5 4.7 4.3  CL 91* 92* 95* 96*  CO2 22 21  20* 20*  GLUCOSE 184* 140* 118* 112*  BUN 27* 29* 27* 28*  CREATININE 1.89* 1.80* 1.92* 1.83*  CALCIUM 9.0 8.6 8.7* 8.6*  PHOS 3.3 3.9  --   --    GFR: Estimated Creatinine Clearance: 24.8 mL/min (A) (by C-G formula based on SCr of 1.83 mg/dL (H)). Liver Function Tests:  Recent Labs Lab 06/13/17 1022 06/15/17 1444 06/15/17 1656 06/16/17 0426  AST 43*  --  50* 48*  ALT 56*  --  61 54  ALKPHOS 171*  --  160* 150*  BILITOT 2.4*  --  3.1* 3.2*  3.0*  PROT 6.8  --  6.8 6.2*  ALBUMIN 3.7  3.7 3.5 3.4* 2.9*    Recent Labs Lab 06/13/17 1022  LIPASE 65.0*  AMYLASE 48   No results for input(s): AMMONIA in the last 168 hours. Coagulation Profile: No results for input(s): INR, PROTIME in the last 168 hours. Cardiac Enzymes: No results for input(s): CKTOTAL, CKMB, CKMBINDEX, TROPONINI in the last 168 hours. BNP (last 3 results) No results for input(s):  PROBNP in the last 8760 hours. HbA1C: No results for input(s): HGBA1C in the last 72 hours. CBG:  Recent Labs Lab 06/15/17 2138 06/16/17 0743  GLUCAP 108* 106*   Lipid Profile: No results for input(s): CHOL, HDL, LDLCALC, TRIG, CHOLHDL, LDLDIRECT in the last 72 hours. Thyroid Function Tests:  Recent Labs  06/15/17 1910  TSH 2.073   Anemia Panel: No results for input(s): VITAMINB12, FOLATE, FERRITIN, TIBC, IRON, RETICCTPCT in the last 72 hours. Urine analysis:    Component Value Date/Time   COLORURINE YELLOW 06/15/2017 1700   APPEARANCEUR CLEAR 06/15/2017 1700   LABSPEC 1.005 06/15/2017 1700   PHURINE 6.0 06/15/2017 1700   GLUCOSEU NEGATIVE 06/15/2017 1700   HGBUR NEGATIVE 06/15/2017 1700   BILIRUBINUR NEGATIVE 06/15/2017 1700   KETONESUR NEGATIVE 06/15/2017 1700   PROTEINUR 100 (A) 06/15/2017 1700   UROBILINOGEN 0.2 10/16/2013 0155   NITRITE NEGATIVE 06/15/2017 1700   LEUKOCYTESUR NEGATIVE 06/15/2017 1700   Sepsis Labs: @LABRCNTIP (procalcitonin:4,lacticidven:4)    Recent Results (from the past 607 hour(s))  Helicobacter pylori special antigen     Status: None   Collection Time: 06/13/17  3:05 PM  Result Value Ref Range Status   H. PYLORI Antigen  NOT DETECTED  Final   H. PYLORI Antigen  Antimicrobials,proton pump inhibitors and bismuth  Final   H. PYLORI Antigen    Final    preparations are known to suppress H.pylori and ingestion of   H. PYLORI Antigen    Final    these prior to testing may cause a false negative result. If   H. PYLORI Antigen    Final    a negative result is obtained for a patient that has    H. PYLORI Antigen  ingested these compounds within two weeks prior of  Final   H. PYLORI Antigen    Final    performing the H.pylori test, results may be falsely   H. PYLORI Antigen    Final    negative and should be repeated with a new specimen two   H. PYLORI Antigen  weeks after discontinuing treatment.  Final      Radiology Studies: Ct  Abdomen Pelvis Wo Contrast  Result Date: 06/15/2017 CLINICAL DATA:  Generalized weakness, loss of appetite and nausea. Chest pain for several months. History of hypertension, diabetes and renal insufficiency. EXAM: CT CHEST, ABDOMEN AND PELVIS WITHOUT CONTRAST TECHNIQUE: Multidetector CT imaging of the chest, abdomen and  pelvis was performed following the standard protocol without IV contrast. COMPARISON:  CXR 01/18/2016, abdomen ultrasound 05/28/2013, chest CT 08/14/2008, MRI 07/23/2013 FINDINGS: CT CHEST FINDINGS Cardiovascular: The cardiac chambers are normal in size. No pericardial effusion. There is coronary arteriosclerosis along the LAD, circumflex and RCA. There is aortic atherosclerosis without aneurysm. The unenhanced pulmonary artery is unremarkable without main pulmonary arterial dilatation. Mediastinum/Nodes: No enlarged mediastinal, hilar, or axillary lymph nodes. Thyroid gland, trachea, and esophagus demonstrate no significant findings. A tiny 2 mm cystic nodule of the left thyroid gland is incidentally noted and stable dating back to 2009 consistent with a benign finding. Lungs/Pleura: Chronic stable apical pleuroparenchymal scarring, right greater than left with calcified and noncalcified pleural plaque or thickening along the posterior aspect of the right hemithorax. Fine interstitial changes along the periphery of the left lower lobe may represent areas of minimal fibrosis with tiny subpleural blebs at each lung base. Similar findings in the medial right middle lobe. No effusion or pneumothorax. No dominant mass. Musculoskeletal: No chest wall mass or suspicious bone lesions identified. Degenerative changes are seen along the dorsal spine. CT ABDOMEN PELVIS FINDINGS Hepatobiliary: Status post cholecystectomy. Chronic atrophy of the left hepatic lobe is again noted with chronic moderately dilated intrahepatic ducts within the atrophic left hepatic lobe. Faint calcifications are seen within the  peripheral dilated ducts suspicious for small intraductal calcifications. The degree of intrahepatic dilatation is somewhat more pronounced than on prior without definite etiology identified. The possibility of intrahepatic strictures are not entirely excluded. A granuloma is noted in the right hepatic lobe. Surgical clips are seen along the margin of the left hepatic lobe. Pancreas: Unremarkable. No pancreatic ductal dilatation or surrounding inflammatory changes. Spleen: Normal in size without focal abnormality. Adrenals/Urinary Tract: Right renal collecting system. No obstructive uropathy. 15 mm cyst in the upper pole of the left kidney. Further characterization not possible given the noncontrast study. Urinary bladder is physiologically distended. Stomach/Bowel: Stomach is within normal limits. Appendix appears normal. No evidence of bowel wall thickening, distention, or inflammatory changes. Vascular/Lymphatic: Aortic atherosclerosis. No enlarged abdominal or pelvic lymph nodes. The splenic and portal vein can't be adequately assessed due to the noncontrast study. Reproductive: Prostatectomy with pelvic clips in place. Other: No abdominal wall hernia or abnormality. No abdominopelvic ascites. Musculoskeletal: No acute nor suspicious osseous lesions. Thoracolumbar spondylosis. No evidence of blastic disease. IMPRESSION: 1. Three-vessel coronary arteriosclerosis. 2. Aortoiliac atherosclerosis without aneurysm. 3. Atrophy of the left hepatic lobe with chronic moderate intrahepatic ductal dilatation slightly increased in prominence since prior exam without etiology. Small strictures are not entirely excluded. Consider repeat MRI of the abdomen. There are a few punctate peripheral calcifications which may represent tiny stones within the or adjacent to the biliary system. Right hepatic granuloma is also noted. 4. Calcified and noncalcified pleural plaque along the posterior aspect of the right thorax. Fine  interstitial prominence may reflect minimal fibrosis in the right middle lobe and left lower lobe with several small bibasilar pulmonary blebs noted. 5. Cholecystectomy. 6. Prostatectomy without evidence of osteoblastic disease. 7. 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 Probable 15 mm cyst left upper pole of the kidney. Electronically Signed   By: Ashley Royalty M.D.   On: 06/15/2017 22:57   Ct Chest Wo Contrast  Result Date: 06/15/2017 CLINICAL DATA:  Generalized weakness, loss of appetite and nausea. Chest pain for several months. History of hypertension, diabetes and renal insufficiency. EXAM: CT CHEST, ABDOMEN AND PELVIS WITHOUT CONTRAST TECHNIQUE: Multidetector CT  imaging of the chest, abdomen and pelvis was performed following the standard protocol without IV contrast. COMPARISON:  CXR 01/18/2016, abdomen ultrasound 05/28/2013, chest CT 08/14/2008, MRI 07/23/2013 FINDINGS: CT CHEST FINDINGS Cardiovascular: The cardiac chambers are normal in size. No pericardial effusion. There is coronary arteriosclerosis along the LAD, circumflex and RCA. There is aortic atherosclerosis without aneurysm. The unenhanced pulmonary artery is unremarkable without main pulmonary arterial dilatation. Mediastinum/Nodes: No enlarged mediastinal, hilar, or axillary lymph nodes. Thyroid gland, trachea, and esophagus demonstrate no significant findings. A tiny 2 mm cystic nodule of the left thyroid gland is incidentally noted and stable dating back to 2009 consistent with a benign finding. Lungs/Pleura: Chronic stable apical pleuroparenchymal scarring, right greater than left with calcified and noncalcified pleural plaque or thickening along the posterior aspect of the right hemithorax. Fine interstitial changes along the periphery of the left lower lobe may represent areas of minimal fibrosis with tiny subpleural blebs at each lung base. Similar findings in the medial right middle lobe. No effusion or  pneumothorax. No dominant mass. Musculoskeletal: No chest wall mass or suspicious bone lesions identified. Degenerative changes are seen along the dorsal spine. CT ABDOMEN PELVIS FINDINGS Hepatobiliary: Status post cholecystectomy. Chronic atrophy of the left hepatic lobe is again noted with chronic moderately dilated intrahepatic ducts within the atrophic left hepatic lobe. Faint calcifications are seen within the peripheral dilated ducts suspicious for small intraductal calcifications. The degree of intrahepatic dilatation is somewhat more pronounced than on prior without definite etiology identified. The possibility of intrahepatic strictures are not entirely excluded. A granuloma is noted in the right hepatic lobe. Surgical clips are seen along the margin of the left hepatic lobe. Pancreas: Unremarkable. No pancreatic ductal dilatation or surrounding inflammatory changes. Spleen: Normal in size without focal abnormality. Adrenals/Urinary Tract: Right renal collecting system. No obstructive uropathy. 15 mm cyst in the upper pole of the left kidney. Further characterization not possible given the noncontrast study. Urinary bladder is physiologically distended. Stomach/Bowel: Stomach is within normal limits. Appendix appears normal. No evidence of bowel wall thickening, distention, or inflammatory changes. Vascular/Lymphatic: Aortic atherosclerosis. No enlarged abdominal or pelvic lymph nodes. The splenic and portal vein can't be adequately assessed due to the noncontrast study. Reproductive: Prostatectomy with pelvic clips in place. Other: No abdominal wall hernia or abnormality. No abdominopelvic ascites. Musculoskeletal: No acute nor suspicious osseous lesions. Thoracolumbar spondylosis. No evidence of blastic disease. IMPRESSION: 1. Three-vessel coronary arteriosclerosis. 2. Aortoiliac atherosclerosis without aneurysm. 3. Atrophy of the left hepatic lobe with chronic moderate intrahepatic ductal dilatation  slightly increased in prominence since prior exam without etiology. Small strictures are not entirely excluded. Consider repeat MRI of the abdomen. There are a few punctate peripheral calcifications which may represent tiny stones within the or adjacent to the biliary system. Right hepatic granuloma is also noted. 4. Calcified and noncalcified pleural plaque along the posterior aspect of the right thorax. Fine interstitial prominence may reflect minimal fibrosis in the right middle lobe and left lower lobe with several small bibasilar pulmonary blebs noted. 5. Cholecystectomy. 6. Prostatectomy without evidence of osteoblastic disease. 7. 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 Probable 15 mm cyst left upper pole of the kidney. Electronically Signed   By: Ashley Royalty M.D.   On: 06/15/2017 22:57      Scheduled Meds: . allopurinol  100 mg Oral Daily  . amLODipine  10 mg Oral QHS  . feeding supplement (ENSURE ENLIVE)  237 mL Oral BID BM  . insulin  aspart  0-5 Units Subcutaneous QHS  . insulin aspart  0-9 Units Subcutaneous TID WC  . metoprolol succinate  100 mg Oral q morning - 10a   Continuous Infusions:   LOS: 1 day    Time spent: 25 minutes  Greater than 50% of the time spent on counseling and coordinating the care.   Leisa Lenz, MD [facility] Pager [phone]  If 7PM-7AM, please contact night-coverage [email or web address] Password [email or web address] 06/16/2017, 12:20 PM

## 2017-06-16 NOTE — Progress Notes (Addendum)
Initial Nutrition Assessment  DOCUMENTATION CODES:   Non-severe (moderate) malnutrition in context of chronic illness  INTERVENTION:   Glucerna Shake po BID, each supplement provides 220 kcal and 10 grams of protein  Provide snacks between meals per family request.   NUTRITION DIAGNOSIS:   Malnutrition (Moderate) related to chronic illness (CKD stage 4) as evidenced by moderate depletion of body fat, moderate depletions of muscle mass.  GOAL:   Patient will meet greater than or equal to 90% of their needs  MONITOR:   PO intake, Supplement acceptance, Weight trends  REASON FOR ASSESSMENT:   Malnutrition Screening Tool   ASSESSMENT:   Pt with PMH of DM, CKD stage 4, HTN, CAD. Presents this admission with abnormally low sodium.   Pt consuming 100% of last meal. Reports having a loss of appetite for 1 month due to an unknown etiology. Suspect pt meets requirements for decreased PO intake for malnutrition but hard to quantify given unspecific recall.   Reports a UBW of 150 lb, the last time being at that wt in November 2017. Records indicate pt has lost 6% of body wt since November 2018 (7 months). This percentage in this time frame is not significant.   Nutrition-Focused physical exam completed. Findings are moderate fat depletion in upper arm and thoracic/lumbar regions, moderate muscle depletion in upper and lower extremites, and no edema. Given this pt's moderate fat and muscle depletion, this pt meets criteria for moderate malnutrition in the context of chronic illness.   Medications reviewed and include: SSI Labs reviewed: Na 124 (L), CBG 112-184  Diet Order:  Diet heart healthy/carb modified Room service appropriate? Yes; Fluid consistency: Thin; Fluid restriction: 1500 mL Fluid  Skin:  Reviewed, no issues  Last BM:  06/15/17  Height:   Ht Readings from Last 1 Encounters:  06/15/17 5\' 7"  (1.702 m)    Weight:   Wt Readings from Last 1 Encounters:  06/15/17 141  lb 6.4 oz (64.1 kg)    Ideal Body Weight:  67.2 kg  BMI:  Body mass index is 22.15 kg/m.  Estimated Nutritional Needs:   Kcal:  1900-2100 (29-32 kcal/kg)  Protein:  105-115 grams (1.6-1.7 g/kg)  Fluid:  >1.9 L/day  EDUCATION NEEDS:   Education needs addressed  Mariana Single RD, LDN Pager # - [phone]

## 2017-06-17 DIAGNOSIS — R160 Hepatomegaly, not elsewhere classified: Secondary | ICD-10-CM

## 2017-06-17 LAB — CBC
HEMATOCRIT: 23.4 % — AB (ref 39.0–52.0)
Hemoglobin: 8 g/dL — ABNORMAL LOW (ref 13.0–17.0)
MCH: 31 pg (ref 26.0–34.0)
MCHC: 34.2 g/dL (ref 30.0–36.0)
MCV: 90.7 fL (ref 78.0–100.0)
PLATELETS: 115 10*3/uL — AB (ref 150–400)
RBC: 2.58 MIL/uL — ABNORMAL LOW (ref 4.22–5.81)
RDW: 13.7 % (ref 11.5–15.5)
WBC: 7.1 10*3/uL (ref 4.0–10.5)

## 2017-06-17 LAB — COMPREHENSIVE METABOLIC PANEL
ALT: 55 U/L (ref 17–63)
AST: 48 U/L — AB (ref 15–41)
Albumin: 2.9 g/dL — ABNORMAL LOW (ref 3.5–5.0)
Alkaline Phosphatase: 148 U/L — ABNORMAL HIGH (ref 38–126)
Anion gap: 7 (ref 5–15)
BILIRUBIN TOTAL: 3 mg/dL — AB (ref 0.3–1.2)
BUN: 34 mg/dL — AB (ref 6–20)
CO2: 20 mmol/L — ABNORMAL LOW (ref 22–32)
CREATININE: 1.92 mg/dL — AB (ref 0.61–1.24)
Calcium: 8.7 mg/dL — ABNORMAL LOW (ref 8.9–10.3)
Chloride: 97 mmol/L — ABNORMAL LOW (ref 101–111)
GFR calc Af Amer: 34 mL/min — ABNORMAL LOW (ref >60)
GFR, EST NON AFRICAN AMERICAN: 29 mL/min — AB (ref >60)
Glucose, Bld: 93 mg/dL (ref 65–99)
POTASSIUM: 4.5 mmol/L (ref 3.5–5.1)
Sodium: 124 mmol/L — ABNORMAL LOW (ref 135–145)
Total Protein: 6.2 g/dL — ABNORMAL LOW (ref 6.5–8.1)

## 2017-06-17 LAB — GLUCOSE, CAPILLARY
GLUCOSE-CAPILLARY: 116 mg/dL — AB (ref 65–99)
GLUCOSE-CAPILLARY: 120 mg/dL — AB (ref 65–99)
Glucose-Capillary: 133 mg/dL — ABNORMAL HIGH (ref 65–99)
Glucose-Capillary: 147 mg/dL — ABNORMAL HIGH (ref 65–99)

## 2017-06-17 NOTE — Progress Notes (Signed)
[facility] Progress Note  Subjective:  Patient feels ok.  MRCP showing intraluminal mass causing biliary obstruction.    Objective:  Vital signs in last 24 hours: Temp:  [98.3 F (36.8 C)-98.5 F (36.9 C)] 98.3 F (36.8 C) (07/21 0623) Pulse Rate:  [61-73] 61 (07/21 0928) Resp:  [16-18] 18 (07/21 0608) BP: (137-149)/(51-62) 137/51 (07/21 0928) SpO2:  [96 %-100 %] 96 % (07/21 0608) Last BM Date: 06/16/17 General:  Alert, Well-developed, in NAD Heart:  Regular rate and rhythm; no murmurs Pulm:  No increased WOB. Abdomen:  Soft, non-distended.  BS present.  Non-tender.  Extremities:  Without edema. Neurologic:  Alert and oriented x 4;  grossly normal neurologically. Psych:  Alert and cooperative. Normal mood and affect.  Intake/Output from previous day: 07/20 0701 - 07/21 0700 In: 720 [P.O.:720] Out: 575 [Urine:575] Intake/Output this shift: Total I/O In: 360 [P.O.:360] Out: 400 [Urine:400]  Lab Results:  Recent Labs  06/15/17 1656 06/16/17 0426 06/17/17 0411  WBC 7.2 6.9 7.1  HGB 8.5* 8.2* 8.0*  HCT 25.0* 24.3* 23.4*  PLT 140* 121* 115*   BMET  Recent Labs  06/16/17 0426 06/16/17 1400 06/17/17 0411  NA 124* 122* 124*  K 4.3 4.6 4.5  CL 96* 92* 97*  CO2 20* 21* 20*  GLUCOSE 112* 158* 93  BUN 28* 27* 34*  CREATININE 1.83* 1.79* 1.92*  CALCIUM 8.6* 8.5* 8.7*   LFT  Recent Labs  06/16/17 0426 06/17/17 0411  PROT 6.2* 6.2*  ALBUMIN 2.9* 2.9*  AST 48* 48*  ALT 54 55  ALKPHOS 150* 148*  BILITOT 3.2*  3.0* 3.0*  BILIDIR 2.1*  --   IBILI 0.9  --    Ct Abdomen Pelvis Wo Contrast  Result Date: 06/15/2017 CLINICAL DATA:  Generalized weakness, loss of appetite and nausea. Chest pain for several months. History of hypertension, diabetes and renal insufficiency. EXAM: CT CHEST, ABDOMEN AND PELVIS WITHOUT CONTRAST TECHNIQUE: Multidetector CT imaging of the chest, abdomen and pelvis was performed following the standard protocol without  IV contrast. COMPARISON:  CXR 01/18/2016, abdomen ultrasound 05/28/2013, chest CT 08/14/2008, MRI 07/23/2013 FINDINGS: CT CHEST FINDINGS Cardiovascular: The cardiac chambers are normal in size. No pericardial effusion. There is coronary arteriosclerosis along the LAD, circumflex and RCA. There is aortic atherosclerosis without aneurysm. The unenhanced pulmonary artery is unremarkable without main pulmonary arterial dilatation. Mediastinum/Nodes: No enlarged mediastinal, hilar, or axillary lymph nodes. Thyroid gland, trachea, and esophagus demonstrate no significant findings. A tiny 2 mm cystic nodule of the left thyroid gland is incidentally noted and stable dating back to 2009 consistent with a benign finding. Lungs/Pleura: Chronic stable apical pleuroparenchymal scarring, right greater than left with calcified and noncalcified pleural plaque or thickening along the posterior aspect of the right hemithorax. Fine interstitial changes along the periphery of the left lower lobe may represent areas of minimal fibrosis with tiny subpleural blebs at each lung base. Similar findings in the medial right middle lobe. No effusion or pneumothorax. No dominant mass. Musculoskeletal: No chest wall mass or suspicious bone lesions identified. Degenerative changes are seen along the dorsal spine. CT ABDOMEN PELVIS FINDINGS Hepatobiliary: Status post cholecystectomy. Chronic atrophy of the left hepatic lobe is again noted with chronic moderately dilated intrahepatic ducts within the atrophic left hepatic lobe. Faint calcifications are seen within the peripheral dilated ducts suspicious for small intraductal calcifications. The degree of intrahepatic dilatation is somewhat more pronounced than on prior without definite etiology identified. The possibility of intrahepatic strictures  are not entirely excluded. A granuloma is noted in the right hepatic lobe. Surgical clips are seen along the margin of the left hepatic lobe. Pancreas:  Unremarkable. No pancreatic ductal dilatation or surrounding inflammatory changes. Spleen: Normal in size without focal abnormality. Adrenals/Urinary Tract: Right renal collecting system. No obstructive uropathy. 15 mm cyst in the upper pole of the left kidney. Further characterization not possible given the noncontrast study. Urinary bladder is physiologically distended. Stomach/Bowel: Stomach is within normal limits. Appendix appears normal. No evidence of bowel wall thickening, distention, or inflammatory changes. Vascular/Lymphatic: Aortic atherosclerosis. No enlarged abdominal or pelvic lymph nodes. The splenic and portal vein can't be adequately assessed due to the noncontrast study. Reproductive: Prostatectomy with pelvic clips in place. Other: No abdominal wall hernia or abnormality. No abdominopelvic ascites. Musculoskeletal: No acute nor suspicious osseous lesions. Thoracolumbar spondylosis. No evidence of blastic disease. IMPRESSION: 1. Three-vessel coronary arteriosclerosis. 2. Aortoiliac atherosclerosis without aneurysm. 3. Atrophy of the left hepatic lobe with chronic moderate intrahepatic ductal dilatation slightly increased in prominence since prior exam without etiology. Small strictures are not entirely excluded. Consider repeat MRI of the abdomen. There are a few punctate peripheral calcifications which may represent tiny stones within the or adjacent to the biliary system. Right hepatic granuloma is also noted. 4. Calcified and noncalcified pleural plaque along the posterior aspect of the right thorax. Fine interstitial prominence may reflect minimal fibrosis in the right middle lobe and left lower lobe with several small bibasilar pulmonary blebs noted. 5. Cholecystectomy. 6. Prostatectomy without evidence of osteoblastic disease. 7. 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 Probable 15 mm cyst left upper pole of the kidney. Electronically Signed   By: Ashley Royalty M.D.    On: 06/15/2017 22:57   Ct Chest Wo Contrast  Result Date: 06/15/2017 CLINICAL DATA:  Generalized weakness, loss of appetite and nausea. Chest pain for several months. History of hypertension, diabetes and renal insufficiency. EXAM: CT CHEST, ABDOMEN AND PELVIS WITHOUT CONTRAST TECHNIQUE: Multidetector CT imaging of the chest, abdomen and pelvis was performed following the standard protocol without IV contrast. COMPARISON:  CXR 01/18/2016, abdomen ultrasound 05/28/2013, chest CT 08/14/2008, MRI 07/23/2013 FINDINGS: CT CHEST FINDINGS Cardiovascular: The cardiac chambers are normal in size. No pericardial effusion. There is coronary arteriosclerosis along the LAD, circumflex and RCA. There is aortic atherosclerosis without aneurysm. The unenhanced pulmonary artery is unremarkable without main pulmonary arterial dilatation. Mediastinum/Nodes: No enlarged mediastinal, hilar, or axillary lymph nodes. Thyroid gland, trachea, and esophagus demonstrate no significant findings. A tiny 2 mm cystic nodule of the left thyroid gland is incidentally noted and stable dating back to 2009 consistent with a benign finding. Lungs/Pleura: Chronic stable apical pleuroparenchymal scarring, right greater than left with calcified and noncalcified pleural plaque or thickening along the posterior aspect of the right hemithorax. Fine interstitial changes along the periphery of the left lower lobe may represent areas of minimal fibrosis with tiny subpleural blebs at each lung base. Similar findings in the medial right middle lobe. No effusion or pneumothorax. No dominant mass. Musculoskeletal: No chest wall mass or suspicious bone lesions identified. Degenerative changes are seen along the dorsal spine. CT ABDOMEN PELVIS FINDINGS Hepatobiliary: Status post cholecystectomy. Chronic atrophy of the left hepatic lobe is again noted with chronic moderately dilated intrahepatic ducts within the atrophic left hepatic lobe. Faint calcifications are  seen within the peripheral dilated ducts suspicious for small intraductal calcifications. The degree of intrahepatic dilatation is somewhat more pronounced than on prior without definite  etiology identified. The possibility of intrahepatic strictures are not entirely excluded. A granuloma is noted in the right hepatic lobe. Surgical clips are seen along the margin of the left hepatic lobe. Pancreas: Unremarkable. No pancreatic ductal dilatation or surrounding inflammatory changes. Spleen: Normal in size without focal abnormality. Adrenals/Urinary Tract: Right renal collecting system. No obstructive uropathy. 15 mm cyst in the upper pole of the left kidney. Further characterization not possible given the noncontrast study. Urinary bladder is physiologically distended. Stomach/Bowel: Stomach is within normal limits. Appendix appears normal. No evidence of bowel wall thickening, distention, or inflammatory changes. Vascular/Lymphatic: Aortic atherosclerosis. No enlarged abdominal or pelvic lymph nodes. The splenic and portal vein can't be adequately assessed due to the noncontrast study. Reproductive: Prostatectomy with pelvic clips in place. Other: No abdominal wall hernia or abnormality. No abdominopelvic ascites. Musculoskeletal: No acute nor suspicious osseous lesions. Thoracolumbar spondylosis. No evidence of blastic disease. IMPRESSION: 1. Three-vessel coronary arteriosclerosis. 2. Aortoiliac atherosclerosis without aneurysm. 3. Atrophy of the left hepatic lobe with chronic moderate intrahepatic ductal dilatation slightly increased in prominence since prior exam without etiology. Small strictures are not entirely excluded. Consider repeat MRI of the abdomen. There are a few punctate peripheral calcifications which may represent tiny stones within the or adjacent to the biliary system. Right hepatic granuloma is also noted. 4. Calcified and noncalcified pleural plaque along the posterior aspect of the right  thorax. Fine interstitial prominence may reflect minimal fibrosis in the right middle lobe and left lower lobe with several small bibasilar pulmonary blebs noted. 5. Cholecystectomy. 6. Prostatectomy without evidence of osteoblastic disease. 7. 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 Probable 15 mm cyst left upper pole of the kidney. Electronically Signed   By: Ashley Royalty M.D.   On: 06/15/2017 22:57   Mr Abdomen Mrcp Wo Contrast  Result Date: 06/17/2017 CLINICAL DATA:  Weight loss, fatigue, nausea, and new primarily direct hyperbilirubinemia. Prior sphincterotomy and prior laparoscopic cholecystectomy. Filling defect or stricture in the central left intrahepatic duct on the intraoperative cholangiogram in 2015. EXAM: MRI ABDOMEN WITHOUT CONTRAST  (INCLUDING MRCP) TECHNIQUE: Multiplanar multisequence MR imaging of the abdomen was performed. Heavily T2-weighted images of the biliary and pancreatic ducts were obtained, and three-dimensional MRCP images were rendered by post processing. COMPARISON:  Multiple exams, including CT scan 06/15/2017 and intraoperative cholangiogram from 02/04/2014 FINDINGS: Lower chest: Unremarkable Hepatobiliary: Prominent left and moderate to prominent right intrahepatic biliary dilatation due to a 3.9 by 2.4 by 3.1 cm primarily intraluminal mass involving the left hepatic duct, common hepatic duct, and right hepatic duct shown on images 26-27 of series 5 and also image 19 series 3. There is associated chronic atrophy in the left hepatic lobe. The intraluminal filling defect is larger than on 07/23/2013, and is currently obstructing the right side in addition to the left. The mass extends nearly to the level of the common bile duct. The CBD distal to the mass is of normal caliber. I do not see a separate mass in the liver. Pancreas:  Unremarkable Spleen:  Unremarkable Adrenals/Urinary Tract: Hydronephrosis involving the upper pole the right kidney with a large  adjacent extrarenal pelvis, but no involvement of the lower pole, query duplicated collecting system. This is a similar appearance to the prior exam. A 1.6 by 1.6 cm cystic lesion of the left kidney upper pole has diffuse high T1 signal along with non dependent high T2 and dependent low T2 signal, favoring a complex cyst. Stomach/Bowel: Unremarkable Vascular/Lymphatic: Aortoiliac atherosclerotic vascular  disease. Small porta hepatis lymph nodes do not appear pathologically enlarged. Other:  No supplemental non-categorized findings. Musculoskeletal: Dextroconvex lumbar scoliosis with associated spondylosis and degenerative disc disease IMPRESSION: 1. Intraluminal mass extending in the common hepatic duct, left hepatic duct, and right hepatic duct in causing biliary obstruction, with considerable biliary dilatation. This represents a moderate progression compared to the 2014 MRI. This is likely chronic on the left side and is causing some atrophy of the left side of the liver. Clearly cholangiocarcinoma would be the main concern, but it would be unusual for this to be present since 2014 without significantly greater progression than what is present in this case. This raises the possibility of other types of biliary filling defects, including intraductal papillary tumor, blood products, sarcoidosis, and liver fluke parasitic infection. Tissue diagnosis is likely warranted. 2. Chronic hydronephrosis involving the upper pole the right kidney, but not involving the lower pole, query duplicated collecting system. 3. Complex cystic lesion of the left kidney upper pole with high precontrast T1 signal. A Bosniak classification cannot be assigned due to the lack of contrast. Electronically Signed   By: Van Clines M.D.   On: 06/17/2017 09:33   Mr 3d Recon At Scanner  Result Date: 06/17/2017 CLINICAL DATA:  Weight loss, fatigue, nausea, and new primarily direct hyperbilirubinemia. Prior sphincterotomy and prior  laparoscopic cholecystectomy. Filling defect or stricture in the central left intrahepatic duct on the intraoperative cholangiogram in 2015. EXAM: MRI ABDOMEN WITHOUT CONTRAST  (INCLUDING MRCP) TECHNIQUE: Multiplanar multisequence MR imaging of the abdomen was performed. Heavily T2-weighted images of the biliary and pancreatic ducts were obtained, and three-dimensional MRCP images were rendered by post processing. COMPARISON:  Multiple exams, including CT scan 06/15/2017 and intraoperative cholangiogram from 02/04/2014 FINDINGS: Lower chest: Unremarkable Hepatobiliary: Prominent left and moderate to prominent right intrahepatic biliary dilatation due to a 3.9 by 2.4 by 3.1 cm primarily intraluminal mass involving the left hepatic duct, common hepatic duct, and right hepatic duct shown on images 26-27 of series 5 and also image 19 series 3. There is associated chronic atrophy in the left hepatic lobe. The intraluminal filling defect is larger than on 07/23/2013, and is currently obstructing the right side in addition to the left. The mass extends nearly to the level of the common bile duct. The CBD distal to the mass is of normal caliber. I do not see a separate mass in the liver. Pancreas:  Unremarkable Spleen:  Unremarkable Adrenals/Urinary Tract: Hydronephrosis involving the upper pole the right kidney with a large adjacent extrarenal pelvis, but no involvement of the lower pole, query duplicated collecting system. This is a similar appearance to the prior exam. A 1.6 by 1.6 cm cystic lesion of the left kidney upper pole has diffuse high T1 signal along with non dependent high T2 and dependent low T2 signal, favoring a complex cyst. Stomach/Bowel: Unremarkable Vascular/Lymphatic: Aortoiliac atherosclerotic vascular disease. Small porta hepatis lymph nodes do not appear pathologically enlarged. Other:  No supplemental non-categorized findings. Musculoskeletal: Dextroconvex lumbar scoliosis with associated  spondylosis and degenerative disc disease IMPRESSION: 1. Intraluminal mass extending in the common hepatic duct, left hepatic duct, and right hepatic duct in causing biliary obstruction, with considerable biliary dilatation. This represents a moderate progression compared to the 2014 MRI. This is likely chronic on the left side and is causing some atrophy of the left side of the liver. Clearly cholangiocarcinoma would be the main concern, but it would be unusual for this to be present since 2014 without significantly  greater progression than what is present in this case. This raises the possibility of other types of biliary filling defects, including intraductal papillary tumor, blood products, sarcoidosis, and liver fluke parasitic infection. Tissue diagnosis is likely warranted. 2. Chronic hydronephrosis involving the upper pole the right kidney, but not involving the lower pole, query duplicated collecting system. 3. Complex cystic lesion of the left kidney upper pole with high precontrast T1 signal. A Bosniak classification cannot be assigned due to the lack of contrast. Electronically Signed   By: Van Clines M.D.   On: 06/17/2017 09:33   Assessment / Plan: *83 yo male with mild weight loss, fatigue, nausea and new hyperbilirubinemia, predominantly direct.  He has a history of left sided intrahepatic ductal dilation possibly from stones found on MRCP in 2014. He is s/p ERCP with removal of sludge from CBD and sphincterotomy. No distal intrahepatic duct strictures appreciated at the time. Subsequently underwent lap chole with IOC. There was an irregularity / filing defect in central left intrahepatic duct without obstruction which was less prominent with a higher balloon occlusion injection but subtle stricture or intraluminal material couldn't be excluded.  Now MRCP showing intraluminal mass causing biliary dilation.  Dr. Havery Moros will discuss with radiology on the best way to access this for tissue  diagnosis, etc.  This is not straightforward and is going to be difficult to address.  Dr. Havery Moros will discuss with family in detail when he is available after procedures later this evening.  #*Worsening chronic normocytic anemia. Baseline 10-11, slow decline over the last month to 8.2 today. Patient was getting iron infusions and Procrit but hasn't gotten neither since at least January which is likely contributing to progressive anemia. His stool is heme positive however. He is already scheduled for outpatient EGD / colonoscopy with [place]. Depending on clinical course it was thought that he may need to have procedures done inpatient, but now these may be on hold until the above issue is sorted out.   *Hyponatremia, new.  Na+ 119, improved to 124. Workup in progress.   *Thrombocytopenia, progressive since March (181 >>>140 >>>120)  *Hx of H.pylori. Apparently treated with Prevpac 2014. H.pylori ag negative 06/13/17.   *CKD 4, at baseline   LOS: 2 days   Aking Klabunde D.  06/17/2017, 11:07 AM  Pager number [phone]

## 2017-06-17 NOTE — Progress Notes (Addendum)
Patient ID: Gerald Jacobs, male   DOB: 1924-12-09, 83 y.o.   MRN: [id number]  PROGRESS NOTE    Gerald Jacobs  VEH:[id number] DOB: 25-Jun-1925 DOA: 06/15/2017  PCP: Golden Circle, FNP   Brief Narrative:  83 year old male with NIDDM, CKD stage 4 with baseline Cr 2.2, hypertensin who presented to ED with abnormal sodium. . Pt was hemodynamically stable. Sodium was 122, bilirubin was 3.1 and ALP 160.   Assessment & Plan:   Principal Problem:   Hyperbilirubinemia / Weight loss / Fatigue / Nausea - Patient had CT abdomen on the admission which showed punctate peripheral calcifications which may represent tiny stones within or adjacent to biliary system, right hepatic granuloma - Patient has history of ERCP and  removal of sludge from CBD and sphincterectomy. He then underwent cholecystectomy - Bilirubin 3.1 on the admission - Appreciate GI recommendations - Plan for MRCP   Active Problems:     Hyponatremia, euvolemic hypo-osmolar  - New hyponatremia, unclear etiology, ?from CKD or dehydration  - Sodium 124 this morning - Continue IV fluids     Type 2 diabetes mellitus with stage 4 chronic kidney disease, without long-term current use of insulin (HCC) - on SSI    CKD (chronic kidney disease), stage IV (HCC) - Cr within baseline range     Anemia of chronic disease - Worsening anemia, baseline hemoglobin is 10-11 and there has been a slow decline over the last month down to about 8. Patient was getting iron infusions and Procrit but hasn't gotten neither since at least January of this year which is likely contributing to progressive anemia - Stool is heme positive. He was scheduled for outpatient EGD and colonoscopy which may end up being done in the hospital depending on GI recommendations    Essential hypertension - Continue Norvasc and metoprolol    Thrombocytopenia - D/C Lovenox subQ - There has been progressive decline in platelet count as well since March, 181 --> 140  --> 120    DVT prophylaxis: SCD's Code Status: full code  Family Communication: family not at the bedside this am; called [phone] and left VM to call me back for updates Disposition Plan: home once sodium improves     Consultants:   GI  Procedures:   None  Antimicrobials:   None   Subjective: No overnight events.  Objective: Vitals:   06/16/17 0555 06/16/17 1406 06/16/17 2212 06/17/17 0608  BP: 140/60 (!) 149/57 (!) 146/57 140/62  Pulse: 64 61 61 73  Resp: 16 16 18 18   Temp: 98.5 F (36.9 C) 98.4 F (36.9 C) 98.5 F (36.9 C) 98.3 F (36.8 C)  TempSrc: Oral Oral Oral Oral  SpO2: 100% 100% 100% 96%  Weight:      Height:        Intake/Output Summary (Last 24 hours) at 06/17/17 0715 Last data filed at 06/16/17 1800  Gross per 24 hour  Intake              720 ml  Output              575 ml  Net              145 ml   Filed Weights   06/15/17 2120  Weight: 64.1 kg (141 lb 6.4 oz)     Physical Exam  Constitutional: Appears well-developed and well-nourished. No distress.  CVS: RRR, S1/S2 +, Pulmonary: Effort and breath sounds normal, no stridor, rhonchi, wheezes, rales.  Abdominal: Soft. BS +,  no distension, tenderness, rebound or guarding.  Musculoskeletal: Normal range of motion. No edema and no tenderness.  Lymphadenopathy: No lymphadenopathy noted, cervical, inguinal. Neuro: Alert. Normal reflexes, muscle tone coordination. No cranial nerve deficit. Skin: Skin is warm and dry.  Psychiatric: Normal mood and affect. Behavior, judgment, thought content normal.     Data Reviewed: I have personally reviewed following labs and imaging studies  CBC:  Recent Labs Lab 06/13/17 1022 06/15/17 1656 06/16/17 0426 06/17/17 0411  WBC 7.4 7.2 6.9 7.1  HGB 9.5* 8.5* 8.2* 8.0*  HCT 27.5* 25.0* 24.3* 23.4*  MCV 95.9 91.6 90.7 90.7  PLT 143.0* 140* 121* 889*   Basic Metabolic Panel:  Recent Labs Lab 06/13/17 1022 06/15/17 1444 06/15/17 1656  06/16/17 0426 06/16/17 1400 06/17/17 0411  NA 122* 119* 122* 124* 122* 124*  K 4.7 4.5 4.7 4.3 4.6 4.5  CL 91* 92* 95* 96* 92* 97*  CO2 22 21 20* 20* 21* 20*  GLUCOSE 184* 140* 118* 112* 158* 93  BUN 27* 29* 27* 28* 27* 34*  CREATININE 1.89* 1.80* 1.92* 1.83* 1.79* 1.92*  CALCIUM 9.0 8.6 8.7* 8.6* 8.5* 8.7*  PHOS 3.3 3.9  --   --   --   --    GFR: Estimated Creatinine Clearance: 23.6 mL/min (A) (by C-G formula based on SCr of 1.92 mg/dL (H)). Liver Function Tests:  Recent Labs Lab 06/13/17 1022 06/15/17 1444 06/15/17 1656 06/16/17 0426 06/17/17 0411  AST 43*  --  50* 48* 48*  ALT 56*  --  61 54 55  ALKPHOS 171*  --  160* 150* 148*  BILITOT 2.4*  --  3.1* 3.2*  3.0* 3.0*  PROT 6.8  --  6.8 6.2* 6.2*  ALBUMIN 3.7  3.7 3.5 3.4* 2.9* 2.9*    Recent Labs Lab 06/13/17 1022  LIPASE 65.0*  AMYLASE 48   No results for input(s): AMMONIA in the last 168 hours. Coagulation Profile: No results for input(s): INR, PROTIME in the last 168 hours. Cardiac Enzymes: No results for input(s): CKTOTAL, CKMB, CKMBINDEX, TROPONINI in the last 168 hours. BNP (last 3 results) No results for input(s): PROBNP in the last 8760 hours. HbA1C: No results for input(s): HGBA1C in the last 72 hours. CBG:  Recent Labs Lab 06/15/17 2138 06/16/17 0743 06/16/17 1222 06/16/17 1648 06/16/17 2213  GLUCAP 108* 106* 119* 164* 111*   Lipid Profile: No results for input(s): CHOL, HDL, LDLCALC, TRIG, CHOLHDL, LDLDIRECT in the last 72 hours. Thyroid Function Tests:  Recent Labs  06/15/17 1910  TSH 2.073   Anemia Panel: No results for input(s): VITAMINB12, FOLATE, FERRITIN, TIBC, IRON, RETICCTPCT in the last 72 hours. Urine analysis:    Component Value Date/Time   COLORURINE YELLOW 06/15/2017 1700   APPEARANCEUR CLEAR 06/15/2017 1700   LABSPEC 1.005 06/15/2017 1700   PHURINE 6.0 06/15/2017 1700   GLUCOSEU NEGATIVE 06/15/2017 1700   HGBUR NEGATIVE 06/15/2017 1700   BILIRUBINUR NEGATIVE  06/15/2017 1700   KETONESUR NEGATIVE 06/15/2017 1700   PROTEINUR 100 (A) 06/15/2017 1700   UROBILINOGEN 0.2 10/16/2013 0155   NITRITE NEGATIVE 06/15/2017 1700   LEUKOCYTESUR NEGATIVE 06/15/2017 1700   Sepsis Labs: @LABRCNTIP (procalcitonin:4,lacticidven:4)    Recent Results (from the past 169 hour(s))  Helicobacter pylori special antigen     Status: None   Collection Time: 06/13/17  3:05 PM  Result Value Ref Range Status   H. PYLORI Antigen  NOT DETECTED  Final   H. PYLORI Antigen  Antimicrobials,proton  pump inhibitors and bismuth  Final   H. PYLORI Antigen    Final    preparations are known to suppress H.pylori and ingestion of   H. PYLORI Antigen    Final    these prior to testing may cause a false negative result. If   H. PYLORI Antigen    Final    a negative result is obtained for a patient that has    H. PYLORI Antigen  ingested these compounds within two weeks prior of  Final   H. PYLORI Antigen    Final    performing the H.pylori test, results may be falsely   H. PYLORI Antigen    Final    negative and should be repeated with a new specimen two   H. PYLORI Antigen  weeks after discontinuing treatment.  Final      Radiology Studies: Ct Abdomen Pelvis Wo Contrast  Result Date: 06/15/2017 CLINICAL DATA:  Generalized weakness, loss of appetite and nausea. Chest pain for several months. History of hypertension, diabetes and renal insufficiency. EXAM: CT CHEST, ABDOMEN AND PELVIS WITHOUT CONTRAST TECHNIQUE: Multidetector CT imaging of the chest, abdomen and pelvis was performed following the standard protocol without IV contrast. COMPARISON:  CXR 01/18/2016, abdomen ultrasound 05/28/2013, chest CT 08/14/2008, MRI 07/23/2013 FINDINGS: CT CHEST FINDINGS Cardiovascular: The cardiac chambers are normal in size. No pericardial effusion. There is coronary arteriosclerosis along the LAD, circumflex and RCA. There is aortic atherosclerosis without aneurysm. The unenhanced pulmonary artery  is unremarkable without main pulmonary arterial dilatation. Mediastinum/Nodes: No enlarged mediastinal, hilar, or axillary lymph nodes. Thyroid gland, trachea, and esophagus demonstrate no significant findings. A tiny 2 mm cystic nodule of the left thyroid gland is incidentally noted and stable dating back to 2009 consistent with a benign finding. Lungs/Pleura: Chronic stable apical pleuroparenchymal scarring, right greater than left with calcified and noncalcified pleural plaque or thickening along the posterior aspect of the right hemithorax. Fine interstitial changes along the periphery of the left lower lobe may represent areas of minimal fibrosis with tiny subpleural blebs at each lung base. Similar findings in the medial right middle lobe. No effusion or pneumothorax. No dominant mass. Musculoskeletal: No chest wall mass or suspicious bone lesions identified. Degenerative changes are seen along the dorsal spine. CT ABDOMEN PELVIS FINDINGS Hepatobiliary: Status post cholecystectomy. Chronic atrophy of the left hepatic lobe is again noted with chronic moderately dilated intrahepatic ducts within the atrophic left hepatic lobe. Faint calcifications are seen within the peripheral dilated ducts suspicious for small intraductal calcifications. The degree of intrahepatic dilatation is somewhat more pronounced than on prior without definite etiology identified. The possibility of intrahepatic strictures are not entirely excluded. A granuloma is noted in the right hepatic lobe. Surgical clips are seen along the margin of the left hepatic lobe. Pancreas: Unremarkable. No pancreatic ductal dilatation or surrounding inflammatory changes. Spleen: Normal in size without focal abnormality. Adrenals/Urinary Tract: Right renal collecting system. No obstructive uropathy. 15 mm cyst in the upper pole of the left kidney. Further characterization not possible given the noncontrast study. Urinary bladder is physiologically  distended. Stomach/Bowel: Stomach is within normal limits. Appendix appears normal. No evidence of bowel wall thickening, distention, or inflammatory changes. Vascular/Lymphatic: Aortic atherosclerosis. No enlarged abdominal or pelvic lymph nodes. The splenic and portal vein can't be adequately assessed due to the noncontrast study. Reproductive: Prostatectomy with pelvic clips in place. Other: No abdominal wall hernia or abnormality. No abdominopelvic ascites. Musculoskeletal: No acute nor suspicious osseous lesions. Thoracolumbar spondylosis.  No evidence of blastic disease. IMPRESSION: 1. Three-vessel coronary arteriosclerosis. 2. Aortoiliac atherosclerosis without aneurysm. 3. Atrophy of the left hepatic lobe with chronic moderate intrahepatic ductal dilatation slightly increased in prominence since prior exam without etiology. Small strictures are not entirely excluded. Consider repeat MRI of the abdomen. There are a few punctate peripheral calcifications which may represent tiny stones within the or adjacent to the biliary system. Right hepatic granuloma is also noted. 4. Calcified and noncalcified pleural plaque along the posterior aspect of the right thorax. Fine interstitial prominence may reflect minimal fibrosis in the right middle lobe and left lower lobe with several small bibasilar pulmonary blebs noted. 5. Cholecystectomy. 6. Prostatectomy without evidence of osteoblastic disease. 7. 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 Probable 15 mm cyst left upper pole of the kidney. Electronically Signed   By: Ashley Royalty M.D.   On: 06/15/2017 22:57   Ct Chest Wo Contrast  Result Date: 06/15/2017 CLINICAL DATA:  Generalized weakness, loss of appetite and nausea. Chest pain for several months. History of hypertension, diabetes and renal insufficiency. EXAM: CT CHEST, ABDOMEN AND PELVIS WITHOUT CONTRAST TECHNIQUE: Multidetector CT imaging of the chest, abdomen and pelvis was performed  following the standard protocol without IV contrast. COMPARISON:  CXR 01/18/2016, abdomen ultrasound 05/28/2013, chest CT 08/14/2008, MRI 07/23/2013 FINDINGS: CT CHEST FINDINGS Cardiovascular: The cardiac chambers are normal in size. No pericardial effusion. There is coronary arteriosclerosis along the LAD, circumflex and RCA. There is aortic atherosclerosis without aneurysm. The unenhanced pulmonary artery is unremarkable without main pulmonary arterial dilatation. Mediastinum/Nodes: No enlarged mediastinal, hilar, or axillary lymph nodes. Thyroid gland, trachea, and esophagus demonstrate no significant findings. A tiny 2 mm cystic nodule of the left thyroid gland is incidentally noted and stable dating back to 2009 consistent with a benign finding. Lungs/Pleura: Chronic stable apical pleuroparenchymal scarring, right greater than left with calcified and noncalcified pleural plaque or thickening along the posterior aspect of the right hemithorax. Fine interstitial changes along the periphery of the left lower lobe may represent areas of minimal fibrosis with tiny subpleural blebs at each lung base. Similar findings in the medial right middle lobe. No effusion or pneumothorax. No dominant mass. Musculoskeletal: No chest wall mass or suspicious bone lesions identified. Degenerative changes are seen along the dorsal spine. CT ABDOMEN PELVIS FINDINGS Hepatobiliary: Status post cholecystectomy. Chronic atrophy of the left hepatic lobe is again noted with chronic moderately dilated intrahepatic ducts within the atrophic left hepatic lobe. Faint calcifications are seen within the peripheral dilated ducts suspicious for small intraductal calcifications. The degree of intrahepatic dilatation is somewhat more pronounced than on prior without definite etiology identified. The possibility of intrahepatic strictures are not entirely excluded. A granuloma is noted in the right hepatic lobe. Surgical clips are seen along the  margin of the left hepatic lobe. Pancreas: Unremarkable. No pancreatic ductal dilatation or surrounding inflammatory changes. Spleen: Normal in size without focal abnormality. Adrenals/Urinary Tract: Right renal collecting system. No obstructive uropathy. 15 mm cyst in the upper pole of the left kidney. Further characterization not possible given the noncontrast study. Urinary bladder is physiologically distended. Stomach/Bowel: Stomach is within normal limits. Appendix appears normal. No evidence of bowel wall thickening, distention, or inflammatory changes. Vascular/Lymphatic: Aortic atherosclerosis. No enlarged abdominal or pelvic lymph nodes. The splenic and portal vein can't be adequately assessed due to the noncontrast study. Reproductive: Prostatectomy with pelvic clips in place. Other: No abdominal wall hernia or abnormality. No abdominopelvic ascites. Musculoskeletal: No acute  nor suspicious osseous lesions. Thoracolumbar spondylosis. No evidence of blastic disease. IMPRESSION: 1. Three-vessel coronary arteriosclerosis. 2. Aortoiliac atherosclerosis without aneurysm. 3. Atrophy of the left hepatic lobe with chronic moderate intrahepatic ductal dilatation slightly increased in prominence since prior exam without etiology. Small strictures are not entirely excluded. Consider repeat MRI of the abdomen. There are a few punctate peripheral calcifications which may represent tiny stones within the or adjacent to the biliary system. Right hepatic granuloma is also noted. 4. Calcified and noncalcified pleural plaque along the posterior aspect of the right thorax. Fine interstitial prominence may reflect minimal fibrosis in the right middle lobe and left lower lobe with several small bibasilar pulmonary blebs noted. 5. Cholecystectomy. 6. Prostatectomy without evidence of osteoblastic disease. 7. 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 Probable 15 mm cyst left upper pole of the kidney.  Electronically Signed   By: Ashley Royalty M.D.   On: 06/15/2017 22:57      Scheduled Meds: . allopurinol  100 mg Oral Daily  . amLODipine  10 mg Oral QHS  . feeding supplement (GLUCERNA SHAKE)  237 mL Oral BID BM  . insulin aspart  0-5 Units Subcutaneous QHS  . insulin aspart  0-9 Units Subcutaneous TID WC  . metoprolol succinate  100 mg Oral q morning - 10a   Continuous Infusions:   LOS: 2 days    Time spent: 25 minutes  Greater than 50% of the time spent on counseling and coordinating the care.   Leisa Lenz, MD [facility] Pager [phone]  If 7PM-7AM, please contact night-coverage [email or web address] Password [facility] 06/17/2017, 7:15 AM

## 2017-06-18 DIAGNOSIS — R17 Unspecified jaundice: Secondary | ICD-10-CM

## 2017-06-18 LAB — COMPREHENSIVE METABOLIC PANEL
ALBUMIN: 2.9 g/dL — AB (ref 3.5–5.0)
ALK PHOS: 160 U/L — AB (ref 38–126)
ALT: 61 U/L (ref 17–63)
ANION GAP: 8 (ref 5–15)
AST: 58 U/L — AB (ref 15–41)
BILIRUBIN TOTAL: 3 mg/dL — AB (ref 0.3–1.2)
BUN: 42 mg/dL — AB (ref 6–20)
CALCIUM: 8.5 mg/dL — AB (ref 8.9–10.3)
CO2: 20 mmol/L — ABNORMAL LOW (ref 22–32)
Chloride: 95 mmol/L — ABNORMAL LOW (ref 101–111)
Creatinine, Ser: 2.33 mg/dL — ABNORMAL HIGH (ref 0.61–1.24)
GFR calc Af Amer: 27 mL/min — ABNORMAL LOW (ref >60)
GFR calc non Af Amer: 23 mL/min — ABNORMAL LOW (ref >60)
GLUCOSE: 126 mg/dL — AB (ref 65–99)
Potassium: 4.5 mmol/L (ref 3.5–5.1)
Sodium: 123 mmol/L — ABNORMAL LOW (ref 135–145)
TOTAL PROTEIN: 6.2 g/dL — AB (ref 6.5–8.1)

## 2017-06-18 LAB — GLUCOSE, CAPILLARY
Glucose-Capillary: 146 mg/dL — ABNORMAL HIGH (ref 65–99)
Glucose-Capillary: 172 mg/dL — ABNORMAL HIGH (ref 65–99)
Glucose-Capillary: 130 mg/dL — ABNORMAL HIGH (ref 65–99)
Glucose-Capillary: 141 mg/dL — ABNORMAL HIGH (ref 65–99)

## 2017-06-18 LAB — CBC
HCT: 22.9 % — ABNORMAL LOW (ref 39.0–52.0)
Hemoglobin: 8 g/dL — ABNORMAL LOW (ref 13.0–17.0)
MCH: 31.6 pg (ref 26.0–34.0)
MCHC: 34.9 g/dL (ref 30.0–36.0)
MCV: 90.5 fL (ref 78.0–100.0)
PLATELETS: 117 10*3/uL — AB (ref 150–400)
RBC: 2.53 MIL/uL — ABNORMAL LOW (ref 4.22–5.81)
RDW: 14 % (ref 11.5–15.5)
WBC: 7.1 10*3/uL (ref 4.0–10.5)

## 2017-06-18 MED ORDER — SODIUM CHLORIDE 0.9 % IV SOLN
INTRAVENOUS | Status: DC
  Administered 2017-06-18 – 2017-06-20 (×3): via INTRAVENOUS
  Filled 2017-06-18 (×3): qty 1000

## 2017-06-18 NOTE — Progress Notes (Signed)
Progress Note   Subjective  Patient has no complaints this AM, feeling well. Hgb stable. Na remains low. LAEs stable.   Objective   Vital signs in last 24 hours: Temp:  [98.1 F (36.7 C)-98.7 F (37.1 C)] 98.4 F (36.9 C) (07/22 0542) Pulse Rate:  [59-61] 59 (07/22 0542) Resp:  [16-18] 16 (07/22 0542) BP: (132-159)/(51-58) 132/53 (07/22 0542) SpO2:  [100 %] 100 % (07/22 0542) Last BM Date: 06/17/17 General:    white male in NAD, mildly jaundiced Heart:  Regular rate and rhythm; no murmurs Lungs: Respirations even and unlabored, lungs CTA bilaterally Abdomen:  Soft, nontender and nondistended.  Extremities:  Without edema. Neurologic:  Alert and oriented,  grossly normal neurologically. Psych:  Cooperative. Normal mood and affect.  Intake/Output from previous day: 07/21 0701 - 07/22 0700 In: 720 [P.O.:720] Out: 1000 [Urine:1000] Intake/Output this shift: No intake/output data recorded.  Lab Results:  Recent Labs  06/16/17 0426 06/17/17 0411 06/18/17 0530  WBC 6.9 7.1 7.1  HGB 8.2* 8.0* 8.0*  HCT 24.3* 23.4* 22.9*  PLT 121* 115* 117*   BMET  Recent Labs  06/16/17 1400 06/17/17 0411 06/18/17 0530  NA 122* 124* 123*  K 4.6 4.5 4.5  CL 92* 97* 95*  CO2 21* 20* 20*  GLUCOSE 158* 93 126*  BUN 27* 34* 42*  CREATININE 1.79* 1.92* 2.33*  CALCIUM 8.5* 8.7* 8.5*   LFT  Recent Labs  06/16/17 0426  06/18/17 0530  PROT 6.2*  < > 6.2*  ALBUMIN 2.9*  < > 2.9*  AST 48*  < > 58*  ALT 54  < > 61  ALKPHOS 150*  < > 160*  BILITOT 3.2*  3.0*  < > 3.0*  BILIDIR 2.1*  --   --   IBILI 0.9  --   --   < > = values in this interval not displayed. PT/INR No results for input(s): LABPROT, INR in the last 72 hours.  Studies/Results: Mr Abdomen Mrcp Wo Contrast  Result Date: 06/17/2017 CLINICAL DATA:  Weight loss, fatigue, nausea, and new primarily direct hyperbilirubinemia. Prior sphincterotomy and prior laparoscopic cholecystectomy. Filling defect or  stricture in the central left intrahepatic duct on the intraoperative cholangiogram in 2015. EXAM: MRI ABDOMEN WITHOUT CONTRAST  (INCLUDING MRCP) TECHNIQUE: Multiplanar multisequence MR imaging of the abdomen was performed. Heavily T2-weighted images of the biliary and pancreatic ducts were obtained, and three-dimensional MRCP images were rendered by post processing. COMPARISON:  Multiple exams, including CT scan 06/15/2017 and intraoperative cholangiogram from 02/04/2014 FINDINGS: Lower chest: Unremarkable Hepatobiliary: Prominent left and moderate to prominent right intrahepatic biliary dilatation due to a 3.9 by 2.4 by 3.1 cm primarily intraluminal mass involving the left hepatic duct, common hepatic duct, and right hepatic duct shown on images 26-27 of series 5 and also image 19 series 3. There is associated chronic atrophy in the left hepatic lobe. The intraluminal filling defect is larger than on 07/23/2013, and is currently obstructing the right side in addition to the left. The mass extends nearly to the level of the common bile duct. The CBD distal to the mass is of normal caliber. I do not see a separate mass in the liver. Pancreas:  Unremarkable Spleen:  Unremarkable Adrenals/Urinary Tract: Hydronephrosis involving the upper pole the right kidney with a large adjacent extrarenal pelvis, but no involvement of the lower pole, query duplicated collecting system. This is a similar appearance to the prior exam. A 1.6 by 1.6 cm cystic lesion  of the left kidney upper pole has diffuse high T1 signal along with non dependent high T2 and dependent low T2 signal, favoring a complex cyst. Stomach/Bowel: Unremarkable Vascular/Lymphatic: Aortoiliac atherosclerotic vascular disease. Small porta hepatis lymph nodes do not appear pathologically enlarged. Other:  No supplemental non-categorized findings. Musculoskeletal: Dextroconvex lumbar scoliosis with associated spondylosis and degenerative disc disease IMPRESSION: 1.  Intraluminal mass extending in the common hepatic duct, left hepatic duct, and right hepatic duct in causing biliary obstruction, with considerable biliary dilatation. This represents a moderate progression compared to the 2014 MRI. This is likely chronic on the left side and is causing some atrophy of the left side of the liver. Clearly cholangiocarcinoma would be the main concern, but it would be unusual for this to be present since 2014 without significantly greater progression than what is present in this case. This raises the possibility of other types of biliary filling defects, including intraductal papillary tumor, blood products, sarcoidosis, and liver fluke parasitic infection. Tissue diagnosis is likely warranted. 2. Chronic hydronephrosis involving the upper pole the right kidney, but not involving the lower pole, query duplicated collecting system. 3. Complex cystic lesion of the left kidney upper pole with high precontrast T1 signal. A Bosniak classification cannot be assigned due to the lack of contrast. Electronically Signed   By: Van Clines M.D.   On: 06/17/2017 09:33   Mr 3d Recon At Scanner  Result Date: 06/17/2017 CLINICAL DATA:  Weight loss, fatigue, nausea, and new primarily direct hyperbilirubinemia. Prior sphincterotomy and prior laparoscopic cholecystectomy. Filling defect or stricture in the central left intrahepatic duct on the intraoperative cholangiogram in 2015. EXAM: MRI ABDOMEN WITHOUT CONTRAST  (INCLUDING MRCP) TECHNIQUE: Multiplanar multisequence MR imaging of the abdomen was performed. Heavily T2-weighted images of the biliary and pancreatic ducts were obtained, and three-dimensional MRCP images were rendered by post processing. COMPARISON:  Multiple exams, including CT scan 06/15/2017 and intraoperative cholangiogram from 02/04/2014 FINDINGS: Lower chest: Unremarkable Hepatobiliary: Prominent left and moderate to prominent right intrahepatic biliary dilatation due to a  3.9 by 2.4 by 3.1 cm primarily intraluminal mass involving the left hepatic duct, common hepatic duct, and right hepatic duct shown on images 26-27 of series 5 and also image 19 series 3. There is associated chronic atrophy in the left hepatic lobe. The intraluminal filling defect is larger than on 07/23/2013, and is currently obstructing the right side in addition to the left. The mass extends nearly to the level of the common bile duct. The CBD distal to the mass is of normal caliber. I do not see a separate mass in the liver. Pancreas:  Unremarkable Spleen:  Unremarkable Adrenals/Urinary Tract: Hydronephrosis involving the upper pole the right kidney with a large adjacent extrarenal pelvis, but no involvement of the lower pole, query duplicated collecting system. This is a similar appearance to the prior exam. A 1.6 by 1.6 cm cystic lesion of the left kidney upper pole has diffuse high T1 signal along with non dependent high T2 and dependent low T2 signal, favoring a complex cyst. Stomach/Bowel: Unremarkable Vascular/Lymphatic: Aortoiliac atherosclerotic vascular disease. Small porta hepatis lymph nodes do not appear pathologically enlarged. Other:  No supplemental non-categorized findings. Musculoskeletal: Dextroconvex lumbar scoliosis with associated spondylosis and degenerative disc disease IMPRESSION: 1. Intraluminal mass extending in the common hepatic duct, left hepatic duct, and right hepatic duct in causing biliary obstruction, with considerable biliary dilatation. This represents a moderate progression compared to the 2014 MRI. This is likely chronic on the left side and  is causing some atrophy of the left side of the liver. Clearly cholangiocarcinoma would be the main concern, but it would be unusual for this to be present since 2014 without significantly greater progression than what is present in this case. This raises the possibility of other types of biliary filling defects, including intraductal  papillary tumor, blood products, sarcoidosis, and liver fluke parasitic infection. Tissue diagnosis is likely warranted. 2. Chronic hydronephrosis involving the upper pole the right kidney, but not involving the lower pole, query duplicated collecting system. 3. Complex cystic lesion of the left kidney upper pole with high precontrast T1 signal. A Bosniak classification cannot be assigned due to the lack of contrast. Electronically Signed   By: Van Clines M.D.   On: 06/17/2017 09:33       Assessment / Plan:   83 y/o male with history of CKD, who presented with hyponatremia and mild jaundice. Also with chronic normocytic anemia with history of IV iron infusions / Procrit, with occult positive stools.  History of gallstones with lap chole, ERCP with removal of sludge from CBD in 2014, with intrahepatic dilation of the left lobe noted at that time. Now with MRCP showing intrahepatic mass of the left lobe with associated atrophy, causing right sided obstruction as well. Ddx includes an intraductal tumor, and while cholangiocarcinoma could certainly look like this it seems much less likely given the chronicity of this finding (at least since 2014).   The patient needs a tissue diagnosis and biliary drainage given his jaundice. Options for this, as discussed with the family, include advanced endoscopy versus IR approach. I have discussed this case with Dr. Laurence Ferrari of IR . I think it would be a technically very challenging ERCP with spyglass to reach this area of concern, biopsy, and drain, however I will discuss this option with Dr. Loletha Carrow who will be assuming his care for the GI inpatient service tomorrow. Otherwise I think IR modality of biopsy and drainage may be the better approach, and they will evaluate him today.  Will need his chronic anemia workup at some point in time, plan on addressing the liver issue first. I have discussed findings and recommendations with the patient and his family at  length. Call if further questions today.   We will reassess him in the morning.   Hato Candal Cellar, MD [facility] Pager [phone]

## 2017-06-18 NOTE — Progress Notes (Signed)
Patient ID: Gerald Jacobs, male   DOB: 27-Jul-1925, 83 y.o.   MRN: [id number]  PROGRESS NOTE    Gerald Jacobs  KKX:[id number] DOB: [birth year] DOA: 06/15/2017  PCP: Golden Circle, FNP   Brief Narrative:  83 year old male with NIDDM, CKD stage 4 with baseline Cr 2.2, hypertensin who presented to ED with abnormal sodium. . Pt was hemodynamically stable. Sodium was 122, bilirubin was 3.1 and ALP 160.   Assessment & Plan:   Principal Problem:   Hyponatremia, euvolemic hypo-osmolar  - New hyponatremia, unclear etiology, ?from CKD or dehydration  - For some reason IV fluids stopped so will resume this am - Sodium 123 - Osm serum 268 but urine sodium WNL - Renal consulted   Active Problems:   Type 2 diabetes mellitus with stage 4 chronic kidney disease, without long-term current use of insulin (HCC) - Continue SSI    CKD (chronic kidney disease), stage IV (HCC) - Slight rise in creatinine this am - Renal consulted     Anemia of chronic disease - Hgb stable    Essential hypertension - Continue Norvasc and metoprolol     Hyperbilirubinemia / Obstructive jaundice  - Has had cholecystectomy in past - Plan for cholangiogram and internal/external biliary drain placement     Thrombocytopenia - D/C Lovenox subQ - Platelets 117  DVT prophylaxis: SCD's  Code Status: full code  Family Communication: called pt family 7/21, left VM to call me back for updates, no family at the bedside this am Disposition Plan: not yet stable for discharge    Consultants:   GI  IR  Procedures:   None   Antimicrobials:   None    Subjective: No overnight events.   Objective: Vitals:   06/17/17 1616 06/17/17 2209 06/18/17 0542 06/18/17 0852  BP: (!) 151/57 (!) 159/58 (!) 132/53 (!) 126/53  Pulse: (!) 59 60 (!) 59 (!) 56  Resp: 16 18 16    Temp: 98.7 F (37.1 C) 98.1 F (36.7 C) 98.4 F (36.9 C)   TempSrc:  Oral Oral   SpO2: 100% 100% 100%   Weight:      Height:         Intake/Output Summary (Last 24 hours) at 06/18/17 1152 Last data filed at 06/18/17 1121  Gross per 24 hour  Intake              360 ml  Output             1150 ml  Net             -790 ml   Filed Weights   06/15/17 2120  Weight: 64.1 kg (141 lb 6.4 oz)   Physical Exam  Constitutional: Appears well-developed and well-nourished. No distress.  CVS: RRR, S1/S2 + Pulmonary: Effort and breath sounds normal, no stridor, rhonchi, wheezes, rales.  Abdominal: Soft. BS +, non tender  Musculoskeletal: Normal range of motion. No tenderness  Lymphadenopathy: No lymphadenopathy noted, cervical, inguinal. Neuro: Alert. No focal deficits  Skin: Skin is warm and dry.  Psychiatric: Normal mood and affect.     Data Reviewed: I have personally reviewed following labs and imaging studies  CBC:  Recent Labs Lab 06/13/17 1022 06/15/17 1656 06/16/17 0426 06/17/17 0411 06/18/17 0530  WBC 7.4 7.2 6.9 7.1 7.1  HGB 9.5* 8.5* 8.2* 8.0* 8.0*  HCT 27.5* 25.0* 24.3* 23.4* 22.9*  MCV 95.9 91.6 90.7 90.7 90.5  PLT 143.0* 140* 121* 115* 169*   Basic Metabolic  Panel:  Recent Labs Lab 06/13/17 1022 06/15/17 1444 06/15/17 1656 06/16/17 0426 06/16/17 1400 06/17/17 0411 06/18/17 0530  NA 122* 119* 122* 124* 122* 124* 123*  K 4.7 4.5 4.7 4.3 4.6 4.5 4.5  CL 91* 92* 95* 96* 92* 97* 95*  CO2 22 21 20* 20* 21* 20* 20*  GLUCOSE 184* 140* 118* 112* 158* 93 126*  BUN 27* 29* 27* 28* 27* 34* 42*  CREATININE 1.89* 1.80* 1.92* 1.83* 1.79* 1.92* 2.33*  CALCIUM 9.0 8.6 8.7* 8.6* 8.5* 8.7* 8.5*  PHOS 3.3 3.9  --   --   --   --   --    GFR: Estimated Creatinine Clearance: 19.5 mL/min (A) (by C-G formula based on SCr of 2.33 mg/dL (H)). Liver Function Tests:  Recent Labs Lab 06/13/17 1022 06/15/17 1444 06/15/17 1656 06/16/17 0426 06/17/17 0411 06/18/17 0530  AST 43*  --  50* 48* 48* 58*  ALT 56*  --  61 54 55 61  ALKPHOS 171*  --  160* 150* 148* 160*  BILITOT 2.4*  --  3.1* 3.2*  3.0*  3.0* 3.0*  PROT 6.8  --  6.8 6.2* 6.2* 6.2*  ALBUMIN 3.7  3.7 3.5 3.4* 2.9* 2.9* 2.9*    Recent Labs Lab 06/13/17 1022  LIPASE 65.0*  AMYLASE 48   No results for input(s): AMMONIA in the last 168 hours. Coagulation Profile: No results for input(s): INR, PROTIME in the last 168 hours. Cardiac Enzymes: No results for input(s): CKTOTAL, CKMB, CKMBINDEX, TROPONINI in the last 168 hours. BNP (last 3 results) No results for input(s): PROBNP in the last 8760 hours. HbA1C: No results for input(s): HGBA1C in the last 72 hours. CBG:  Recent Labs Lab 06/17/17 0809 06/17/17 1243 06/17/17 1736 06/17/17 2208 06/18/17 0821  GLUCAP 133* 116* 120* 147* 146*   Lipid Profile: No results for input(s): CHOL, HDL, LDLCALC, TRIG, CHOLHDL, LDLDIRECT in the last 72 hours. Thyroid Function Tests:  Recent Labs  06/15/17 1910  TSH 2.073   Anemia Panel: No results for input(s): VITAMINB12, FOLATE, FERRITIN, TIBC, IRON, RETICCTPCT in the last 72 hours. Urine analysis:    Component Value Date/Time   COLORURINE YELLOW 06/15/2017 1700   APPEARANCEUR CLEAR 06/15/2017 1700   LABSPEC 1.005 06/15/2017 1700   PHURINE 6.0 06/15/2017 1700   GLUCOSEU NEGATIVE 06/15/2017 1700   HGBUR NEGATIVE 06/15/2017 1700   BILIRUBINUR NEGATIVE 06/15/2017 1700   KETONESUR NEGATIVE 06/15/2017 1700   PROTEINUR 100 (A) 06/15/2017 1700   UROBILINOGEN 0.2 10/16/2013 0155   NITRITE NEGATIVE 06/15/2017 1700   LEUKOCYTESUR NEGATIVE 06/15/2017 1700   Sepsis Labs: @LABRCNTIP (procalcitonin:4,lacticidven:4)    Recent Results (from the past 559 hour(s))  Helicobacter pylori special antigen     Status: None   Collection Time: 06/13/17  3:05 PM  Result Value Ref Range Status   H. PYLORI Antigen  NOT DETECTED  Final   H. PYLORI Antigen  Antimicrobials,proton pump inhibitors and bismuth  Final   H. PYLORI Antigen    Final    preparations are known to suppress H.pylori and ingestion of   H. PYLORI Antigen    Final     these prior to testing may cause a false negative result. If   H. PYLORI Antigen    Final    a negative result is obtained for a patient that has    H. PYLORI Antigen  ingested these compounds within two weeks prior of  Final   H. PYLORI Antigen    Final  performing the H.pylori test, results may be falsely   H. PYLORI Antigen    Final    negative and should be repeated with a new specimen two   H. PYLORI Antigen  weeks after discontinuing treatment.  Final      Radiology Studies: Ct Abdomen Pelvis Wo Contrast  Result Date: 06/15/2017 CLINICAL DATA:  Generalized weakness, loss of appetite and nausea. Chest pain for several months. History of hypertension, diabetes and renal insufficiency. EXAM: CT CHEST, ABDOMEN AND PELVIS WITHOUT CONTRAST TECHNIQUE: Multidetector CT imaging of the chest, abdomen and pelvis was performed following the standard protocol without IV contrast. COMPARISON:  CXR 01/18/2016, abdomen ultrasound 05/28/2013, chest CT 08/14/2008, MRI 07/23/2013 FINDINGS: CT CHEST FINDINGS Cardiovascular: The cardiac chambers are normal in size. No pericardial effusion. There is coronary arteriosclerosis along the LAD, circumflex and RCA. There is aortic atherosclerosis without aneurysm. The unenhanced pulmonary artery is unremarkable without main pulmonary arterial dilatation. Mediastinum/Nodes: No enlarged mediastinal, hilar, or axillary lymph nodes. Thyroid gland, trachea, and esophagus demonstrate no significant findings. A tiny 2 mm cystic nodule of the left thyroid gland is incidentally noted and stable dating back to 2009 consistent with a benign finding. Lungs/Pleura: Chronic stable apical pleuroparenchymal scarring, right greater than left with calcified and noncalcified pleural plaque or thickening along the posterior aspect of the right hemithorax. Fine interstitial changes along the periphery of the left lower lobe may represent areas of minimal fibrosis with tiny subpleural blebs  at each lung base. Similar findings in the medial right middle lobe. No effusion or pneumothorax. No dominant mass. Musculoskeletal: No chest wall mass or suspicious bone lesions identified. Degenerative changes are seen along the dorsal spine. CT ABDOMEN PELVIS FINDINGS Hepatobiliary: Status post cholecystectomy. Chronic atrophy of the left hepatic lobe is again noted with chronic moderately dilated intrahepatic ducts within the atrophic left hepatic lobe. Faint calcifications are seen within the peripheral dilated ducts suspicious for small intraductal calcifications. The degree of intrahepatic dilatation is somewhat more pronounced than on prior without definite etiology identified. The possibility of intrahepatic strictures are not entirely excluded. A granuloma is noted in the right hepatic lobe. Surgical clips are seen along the margin of the left hepatic lobe. Pancreas: Unremarkable. No pancreatic ductal dilatation or surrounding inflammatory changes. Spleen: Normal in size without focal abnormality. Adrenals/Urinary Tract: Right renal collecting system. No obstructive uropathy. 15 mm cyst in the upper pole of the left kidney. Further characterization not possible given the noncontrast study. Urinary bladder is physiologically distended. Stomach/Bowel: Stomach is within normal limits. Appendix appears normal. No evidence of bowel wall thickening, distention, or inflammatory changes. Vascular/Lymphatic: Aortic atherosclerosis. No enlarged abdominal or pelvic lymph nodes. The splenic and portal vein can't be adequately assessed due to the noncontrast study. Reproductive: Prostatectomy with pelvic clips in place. Other: No abdominal wall hernia or abnormality. No abdominopelvic ascites. Musculoskeletal: No acute nor suspicious osseous lesions. Thoracolumbar spondylosis. No evidence of blastic disease. IMPRESSION: 1. Three-vessel coronary arteriosclerosis. 2. Aortoiliac atherosclerosis without aneurysm. 3.  Atrophy of the left hepatic lobe with chronic moderate intrahepatic ductal dilatation slightly increased in prominence since prior exam without etiology. Small strictures are not entirely excluded. Consider repeat MRI of the abdomen. There are a few punctate peripheral calcifications which may represent tiny stones within the or adjacent to the biliary system. Right hepatic granuloma is also noted. 4. Calcified and noncalcified pleural plaque along the posterior aspect of the right thorax. Fine interstitial prominence may reflect minimal fibrosis in the right middle lobe  and left lower lobe with several small bibasilar pulmonary blebs noted. 5. Cholecystectomy. 6. Prostatectomy without evidence of osteoblastic disease. 7. 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 Probable 15 mm cyst left upper pole of the kidney. Electronically Signed   By: Ashley Royalty M.D.   On: 06/15/2017 22:57   Ct Chest Wo Contrast  Result Date: 06/15/2017 CLINICAL DATA:  Generalized weakness, loss of appetite and nausea. Chest pain for several months. History of hypertension, diabetes and renal insufficiency. EXAM: CT CHEST, ABDOMEN AND PELVIS WITHOUT CONTRAST TECHNIQUE: Multidetector CT imaging of the chest, abdomen and pelvis was performed following the standard protocol without IV contrast. COMPARISON:  CXR 01/18/2016, abdomen ultrasound 05/28/2013, chest CT 08/14/2008, MRI 07/23/2013 FINDINGS: CT CHEST FINDINGS Cardiovascular: The cardiac chambers are normal in size. No pericardial effusion. There is coronary arteriosclerosis along the LAD, circumflex and RCA. There is aortic atherosclerosis without aneurysm. The unenhanced pulmonary artery is unremarkable without main pulmonary arterial dilatation. Mediastinum/Nodes: No enlarged mediastinal, hilar, or axillary lymph nodes. Thyroid gland, trachea, and esophagus demonstrate no significant findings. A tiny 2 mm cystic nodule of the left thyroid gland is incidentally  noted and stable dating back to 2009 consistent with a benign finding. Lungs/Pleura: Chronic stable apical pleuroparenchymal scarring, right greater than left with calcified and noncalcified pleural plaque or thickening along the posterior aspect of the right hemithorax. Fine interstitial changes along the periphery of the left lower lobe may represent areas of minimal fibrosis with tiny subpleural blebs at each lung base. Similar findings in the medial right middle lobe. No effusion or pneumothorax. No dominant mass. Musculoskeletal: No chest wall mass or suspicious bone lesions identified. Degenerative changes are seen along the dorsal spine. CT ABDOMEN PELVIS FINDINGS Hepatobiliary: Status post cholecystectomy. Chronic atrophy of the left hepatic lobe is again noted with chronic moderately dilated intrahepatic ducts within the atrophic left hepatic lobe. Faint calcifications are seen within the peripheral dilated ducts suspicious for small intraductal calcifications. The degree of intrahepatic dilatation is somewhat more pronounced than on prior without definite etiology identified. The possibility of intrahepatic strictures are not entirely excluded. A granuloma is noted in the right hepatic lobe. Surgical clips are seen along the margin of the left hepatic lobe. Pancreas: Unremarkable. No pancreatic ductal dilatation or surrounding inflammatory changes. Spleen: Normal in size without focal abnormality. Adrenals/Urinary Tract: Right renal collecting system. No obstructive uropathy. 15 mm cyst in the upper pole of the left kidney. Further characterization not possible given the noncontrast study. Urinary bladder is physiologically distended. Stomach/Bowel: Stomach is within normal limits. Appendix appears normal. No evidence of bowel wall thickening, distention, or inflammatory changes. Vascular/Lymphatic: Aortic atherosclerosis. No enlarged abdominal or pelvic lymph nodes. The splenic and portal vein can't be  adequately assessed due to the noncontrast study. Reproductive: Prostatectomy with pelvic clips in place. Other: No abdominal wall hernia or abnormality. No abdominopelvic ascites. Musculoskeletal: No acute nor suspicious osseous lesions. Thoracolumbar spondylosis. No evidence of blastic disease. IMPRESSION: 1. Three-vessel coronary arteriosclerosis. 2. Aortoiliac atherosclerosis without aneurysm. 3. Atrophy of the left hepatic lobe with chronic moderate intrahepatic ductal dilatation slightly increased in prominence since prior exam without etiology. Small strictures are not entirely excluded. Consider repeat MRI of the abdomen. There are a few punctate peripheral calcifications which may represent tiny stones within the or adjacent to the biliary system. Right hepatic granuloma is also noted. 4. Calcified and noncalcified pleural plaque along the posterior aspect of the right thorax. Fine interstitial prominence may reflect minimal  fibrosis in the right middle lobe and left lower lobe with several small bibasilar pulmonary blebs noted. 5. Cholecystectomy. 6. Prostatectomy without evidence of osteoblastic disease. 7. 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 Probable 15 mm cyst left upper pole of the kidney. Electronically Signed   By: Ashley Royalty M.D.   On: 06/15/2017 22:57   Mr Abdomen Mrcp Wo Contrast  Result Date: 06/17/2017 CLINICAL DATA:  Weight loss, fatigue, nausea, and new primarily direct hyperbilirubinemia. Prior sphincterotomy and prior laparoscopic cholecystectomy. Filling defect or stricture in the central left intrahepatic duct on the intraoperative cholangiogram in 2015. EXAM: MRI ABDOMEN WITHOUT CONTRAST  (INCLUDING MRCP) TECHNIQUE: Multiplanar multisequence MR imaging of the abdomen was performed. Heavily T2-weighted images of the biliary and pancreatic ducts were obtained, and three-dimensional MRCP images were rendered by post processing. COMPARISON:  Multiple exams,  including CT scan 06/15/2017 and intraoperative cholangiogram from 02/04/2014 FINDINGS: Lower chest: Unremarkable Hepatobiliary: Prominent left and moderate to prominent right intrahepatic biliary dilatation due to a 3.9 by 2.4 by 3.1 cm primarily intraluminal mass involving the left hepatic duct, common hepatic duct, and right hepatic duct shown on images 26-27 of series 5 and also image 19 series 3. There is associated chronic atrophy in the left hepatic lobe. The intraluminal filling defect is larger than on 07/23/2013, and is currently obstructing the right side in addition to the left. The mass extends nearly to the level of the common bile duct. The CBD distal to the mass is of normal caliber. I do not see a separate mass in the liver. Pancreas:  Unremarkable Spleen:  Unremarkable Adrenals/Urinary Tract: Hydronephrosis involving the upper pole the right kidney with a large adjacent extrarenal pelvis, but no involvement of the lower pole, query duplicated collecting system. This is a similar appearance to the prior exam. A 1.6 by 1.6 cm cystic lesion of the left kidney upper pole has diffuse high T1 signal along with non dependent high T2 and dependent low T2 signal, favoring a complex cyst. Stomach/Bowel: Unremarkable Vascular/Lymphatic: Aortoiliac atherosclerotic vascular disease. Small porta hepatis lymph nodes do not appear pathologically enlarged. Other:  No supplemental non-categorized findings. Musculoskeletal: Dextroconvex lumbar scoliosis with associated spondylosis and degenerative disc disease IMPRESSION: 1. Intraluminal mass extending in the common hepatic duct, left hepatic duct, and right hepatic duct in causing biliary obstruction, with considerable biliary dilatation. This represents a moderate progression compared to the 2014 MRI. This is likely chronic on the left side and is causing some atrophy of the left side of the liver. Clearly cholangiocarcinoma would be the main concern, but it would  be unusual for this to be present since 2014 without significantly greater progression than what is present in this case. This raises the possibility of other types of biliary filling defects, including intraductal papillary tumor, blood products, sarcoidosis, and liver fluke parasitic infection. Tissue diagnosis is likely warranted. 2. Chronic hydronephrosis involving the upper pole the right kidney, but not involving the lower pole, query duplicated collecting system. 3. Complex cystic lesion of the left kidney upper pole with high precontrast T1 signal. A Bosniak classification cannot be assigned due to the lack of contrast. Electronically Signed   By: Van Clines M.D.   On: 06/17/2017 09:33   Mr 3d Recon At Scanner  Result Date: 06/17/2017 CLINICAL DATA:  Weight loss, fatigue, nausea, and new primarily direct hyperbilirubinemia. Prior sphincterotomy and prior laparoscopic cholecystectomy. Filling defect or stricture in the central left intrahepatic duct on the intraoperative cholangiogram in  2015. EXAM: MRI ABDOMEN WITHOUT CONTRAST  (INCLUDING MRCP) TECHNIQUE: Multiplanar multisequence MR imaging of the abdomen was performed. Heavily T2-weighted images of the biliary and pancreatic ducts were obtained, and three-dimensional MRCP images were rendered by post processing. COMPARISON:  Multiple exams, including CT scan 06/15/2017 and intraoperative cholangiogram from 02/04/2014 FINDINGS: Lower chest: Unremarkable Hepatobiliary: Prominent left and moderate to prominent right intrahepatic biliary dilatation due to a 3.9 by 2.4 by 3.1 cm primarily intraluminal mass involving the left hepatic duct, common hepatic duct, and right hepatic duct shown on images 26-27 of series 5 and also image 19 series 3. There is associated chronic atrophy in the left hepatic lobe. The intraluminal filling defect is larger than on 07/23/2013, and is currently obstructing the right side in addition to the left. The mass extends  nearly to the level of the common bile duct. The CBD distal to the mass is of normal caliber. I do not see a separate mass in the liver. Pancreas:  Unremarkable Spleen:  Unremarkable Adrenals/Urinary Tract: Hydronephrosis involving the upper pole the right kidney with a large adjacent extrarenal pelvis, but no involvement of the lower pole, query duplicated collecting system. This is a similar appearance to the prior exam. A 1.6 by 1.6 cm cystic lesion of the left kidney upper pole has diffuse high T1 signal along with non dependent high T2 and dependent low T2 signal, favoring a complex cyst. Stomach/Bowel: Unremarkable Vascular/Lymphatic: Aortoiliac atherosclerotic vascular disease. Small porta hepatis lymph nodes do not appear pathologically enlarged. Other:  No supplemental non-categorized findings. Musculoskeletal: Dextroconvex lumbar scoliosis with associated spondylosis and degenerative disc disease IMPRESSION: 1. Intraluminal mass extending in the common hepatic duct, left hepatic duct, and right hepatic duct in causing biliary obstruction, with considerable biliary dilatation. This represents a moderate progression compared to the 2014 MRI. This is likely chronic on the left side and is causing some atrophy of the left side of the liver. Clearly cholangiocarcinoma would be the main concern, but it would be unusual for this to be present since 2014 without significantly greater progression than what is present in this case. This raises the possibility of other types of biliary filling defects, including intraductal papillary tumor, blood products, sarcoidosis, and liver fluke parasitic infection. Tissue diagnosis is likely warranted. 2. Chronic hydronephrosis involving the upper pole the right kidney, but not involving the lower pole, query duplicated collecting system. 3. Complex cystic lesion of the left kidney upper pole with high precontrast T1 signal. A Bosniak classification cannot be assigned due to  the lack of contrast. Electronically Signed   By: Van Clines M.D.   On: 06/17/2017 09:33      Scheduled Meds: . allopurinol  100 mg Oral Daily  . amLODipine  10 mg Oral QHS  . feeding supplement (GLUCERNA SHAKE)  237 mL Oral BID BM  . insulin aspart  0-5 Units Subcutaneous QHS  . insulin aspart  0-9 Units Subcutaneous TID WC  . metoprolol succinate  100 mg Oral q morning - 10a   Continuous Infusions: . sodium chloride 0.9 % 1,000 mL infusion       LOS: 3 days    Time spent: 25 minutes  Greater than 50% of the time spent on counseling and coordinating the care.   Leisa Lenz, MD [facility] Pager [phone]  If 7PM-7AM, please contact night-coverage [email or web address] Password [email or web address] 06/18/2017, 11:52 AM

## 2017-06-18 NOTE — Consult Note (Signed)
Chief Complaint: Patient was seen in consultation today for obstructive jaundice  Referring Physician(s):  Dr. Havery Moros  Supervising Physician: Jacqulynn Cadet  Patient Status: [facility] - In-pt  History of Present Illness: Gerald Jacobs is a 83 y.o. male with past medical history of DM, CAD, GERD, HTN, renal insufficiency, osteoarthritis, and prostate cancer presented to [facility] ED at the suggestion of his PCP due to nausea and hyponatremia.    CT Abd/Pelvis 7/19 showed:  1. Three-vessel coronary arteriosclerosis. 2. Aortoiliac atherosclerosis without aneurysm. 3. Atrophy of the left hepatic lobe with chronic moderate intrahepatic ductal dilatation slightly increased in prominence since prior exam without etiology. Small strictures are not entirely excluded. Consider repeat MRI of the abdomen. There are a few punctate peripheral calcifications which may represent tiny stones within the or adjacent to the biliary system. Right hepatic granuloma is also noted. 4. Calcified and noncalcified pleural plaque along the posterior aspect of the right thorax. Fine interstitial prominence may reflect minimal fibrosis in the right middle lobe and left lower lobe with several small bibasilar pulmonary blebs noted. 5. Cholecystectomy. 6. Prostatectomy without evidence of osteoblastic disease. 7. 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 Probable 15 mm cyst left upper pole of the kidney.  MRCP 7/21 showed: 1. Intraluminal mass extending in the common hepatic duct, left hepatic duct, and right hepatic duct in causing biliary obstruction, with considerable biliary dilatation. This represents a moderate progression compared to the 2014 MRI. This is likely chronic on the left side and is causing some atrophy of the left side of the liver. Clearly cholangiocarcinoma would be the main concern, but it would be unusual for this to be present since 2014 without significantly greater  progression than what is present in this case. This raises the possibility of other types of biliary filling defects, including intraductal papillary tumor, blood products, sarcoidosis, and liver fluke parasitic infection. Tissue diagnosis is likely warranted. 2. Chronic hydronephrosis involving the upper pole the right kidney, but not involving the lower pole, query duplicated collecting system. 3. Complex cystic lesion of the left kidney upper pole with high precontrast T1 signal. A Bosniak classification cannot be assigned due to the lack of contrast.  IR consulted for cholangiogram with possible internal/external biliary drain placement.  Case reviewed by Dr. Laurence Ferrari who feels patient is appropriate for procedure attempt.   Past Medical History:  Diagnosis Date  . Cancer [facility])    prostate  . Chronic anemia    With normal iron studies and normal serum protein electrophoresis  . Coronary artery disease    Mild  . Diabetes mellitus    Adult onset  . Essential hypertension   . GERD (gastroesophageal reflux disease)    occ tums  . History of gout   . Mild renal insufficiency   . Nocturia    x2  . Osteoarthritis     Past Surgical History:  Procedure Laterality Date  . CHOLECYSTECTOMY  02/04/2014   DR Zella Richer  . CHOLECYSTECTOMY N/A 02/04/2014   Procedure: LAPAROSCOPIC CHOLECYSTECTOMY with intraoperative cholangiogram;  Surgeon: Odis Hollingshead, MD;  Location: [place];  Service: General;  Laterality: N/A;  . ERCP N/A 10/15/2013   Procedure: ENDOSCOPIC RETROGRADE CHOLANGIOPANCREATOGRAPHY (ERCP);  Surgeon: Jeryl Columbia, MD;  Location: [facility];  Service: Endoscopy;  Laterality: N/A;  . KNEE SURGERY Right 1979  . melanoma surgery  1970's   on scalp  . PROSTATE SURGERY  1993   Prostate Cancer  . SPHINCTEROTOMY  10/15/2013   Procedure: SPHINCTEROTOMY;  Surgeon: Jeryl Columbia, MD;  Location: [facility];  Service: Endoscopy;;    Allergies: Colchicine; Flexeril  [cyclobenzaprine hcl]; Levaquin [levofloxacin in d5w]; Percocet [oxycodone-acetaminophen]; and Zebeta  Medications: Prior to Admission medications   Medication Sig Start Date End Date Taking? Authorizing Provider  allopurinol (ZYLOPRIM) 100 MG tablet TAKE 1 TABLET (100 MG TOTAL) BY MOUTH DAILY. 03/15/17  Yes Golden Circle, FNP  amLODipine (NORVASC) 2.5 MG tablet Take 1 tablet (2.5 mg total) by mouth daily. Patient taking differently: Take 2.5 mg by mouth at bedtime.  03/15/17 06/15/17 Yes Nahser, Wonda Cheng, MD  Biotin 1000 MCG tablet Take 1,000 mcg by mouth daily.   Yes [provider]  calcium carbonate (TUMS - DOSED IN MG ELEMENTAL CALCIUM) 500 MG chewable tablet Chew 1 tablet by mouth 2 (two) times daily as needed for indigestion or heartburn.   Yes [provider]  diphenhydramine-acetaminophen (TYLENOL PM) 25-500 MG TABS Take 1 tablet by mouth at bedtime as needed (sleep).   Yes [provider]  L-Lysine 500 MG CAPS Take 500 mg by mouth 2 (two) times daily.   Yes [provider]  losartan-hydrochlorothiazide (HYZAAR) 100-12.5 MG tablet TAKE 1/2 TABLET BY MOUTH DAILY 03/09/17  Yes Burtis Junes, NP  metoprolol succinate (TOPROL-XL) 50 MG 24 hr tablet Take 50 mg by mouth every morning.  01/04/17  Yes [provider]  Omega-3 Fatty Acids (FISH OIL PO) Take 1 capsule by mouth daily.   Yes [provider]  saxagliptin HCl (ONGLYZA) 2.5 MG TABS tablet Take 2.5 mg by mouth daily.   Yes [provider]  aspirin 81 MG tablet Take 81 mg by mouth daily.      [provider]  glucose blood test strip Use one strip per test. Check blood sugars 1-4 times per day as instructed. 01/18/16   Golden Circle, FNP  ONETOUCH DELICA LANCETS 68T MISC TEST BLOOD SUGARS 1-4 TIMES A DAY AS INSTRUCTED. DX CODE: E11.9 03/29/17   Golden Circle, FNP     Family History  Problem Relation Age of Onset  . Stroke Father   . Stroke Mother   .  Stroke Sister   . Stroke Brother   . Stroke Brother   . Stroke Brother     Social History   Social History  . Marital status: Married    Spouse name: N/A  . Number of children: 2  . Years of education: 45   Social History Main Topics  . Smoking status: Former Smoker    Packs/day: 1.00    Years: 45.00    Types: Cigarettes    Quit date: 09/07/1979  . Smokeless tobacco: Never Used  . Alcohol use Yes     Comment: beer rarely  . Drug use: No  . Sexual activity: Not Asked   Other Topics Concern  . 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     Review of Systems  Constitutional: Positive for appetite change and fatigue. Negative for fever.  Respiratory: Negative for cough and shortness of breath.   Cardiovascular: Negative for chest pain.  Gastrointestinal: Positive for abdominal pain and nausea.  Psychiatric/Behavioral: Negative for behavioral problems and confusion.    Vital Signs: BP (!) 126/53   Pulse (!) 56   Temp 98.4 F (36.9 C) (Oral)   Resp 16   Ht 5\' 7"  (1.702 m)   Wt 141 lb 6.4 oz (  64.1 kg)   SpO2 100%   BMI 22.15 kg/m   Physical Exam  Constitutional: He is oriented to person, place, and time. He appears well-developed.  Cardiovascular: Normal rate, regular rhythm and normal heart sounds.   Pulmonary/Chest: Effort normal and breath sounds normal. No respiratory distress.  Abdominal: He exhibits no distension. There is no tenderness.  Neurological: He is alert and oriented to person, place, and time.  Skin: Skin is warm and dry.  Psychiatric: He has a normal mood and affect. His behavior is normal. Judgment and thought content normal.  Nursing note and vitals reviewed.   Mallampati Score:  MD Evaluation Airway: WNL Heart: WNL Abdomen: WNL Chest/ Lungs: WNL ASA  Classification: 3 Mallampati/Airway Score: Two  Imaging: Ct Abdomen Pelvis Wo Contrast  Result Date: 06/15/2017 CLINICAL DATA:  Generalized weakness,  loss of appetite and nausea. Chest pain for several months. History of hypertension, diabetes and renal insufficiency. EXAM: CT CHEST, ABDOMEN AND PELVIS WITHOUT CONTRAST TECHNIQUE: Multidetector CT imaging of the chest, abdomen and pelvis was performed following the standard protocol without IV contrast. COMPARISON:  CXR 01/18/2016, abdomen ultrasound 05/28/2013, chest CT 08/14/2008, MRI 07/23/2013 FINDINGS: CT CHEST FINDINGS Cardiovascular: The cardiac chambers are normal in size. No pericardial effusion. There is coronary arteriosclerosis along the LAD, circumflex and RCA. There is aortic atherosclerosis without aneurysm. The unenhanced pulmonary artery is unremarkable without main pulmonary arterial dilatation. Mediastinum/Nodes: No enlarged mediastinal, hilar, or axillary lymph nodes. Thyroid gland, trachea, and esophagus demonstrate no significant findings. A tiny 2 mm cystic nodule of the left thyroid gland is incidentally noted and stable dating back to 2009 consistent with a benign finding. Lungs/Pleura: Chronic stable apical pleuroparenchymal scarring, right greater than left with calcified and noncalcified pleural plaque or thickening along the posterior aspect of the right hemithorax. Fine interstitial changes along the periphery of the left lower lobe may represent areas of minimal fibrosis with tiny subpleural blebs at each lung base. Similar findings in the medial right middle lobe. No effusion or pneumothorax. No dominant mass. Musculoskeletal: No chest wall mass or suspicious bone lesions identified. Degenerative changes are seen along the dorsal spine. CT ABDOMEN PELVIS FINDINGS Hepatobiliary: Status post cholecystectomy. Chronic atrophy of the left hepatic lobe is again noted with chronic moderately dilated intrahepatic ducts within the atrophic left hepatic lobe. Faint calcifications are seen within the peripheral dilated ducts suspicious for small intraductal calcifications. The degree of  intrahepatic dilatation is somewhat more pronounced than on prior without definite etiology identified. The possibility of intrahepatic strictures are not entirely excluded. A granuloma is noted in the right hepatic lobe. Surgical clips are seen along the margin of the left hepatic lobe. Pancreas: Unremarkable. No pancreatic ductal dilatation or surrounding inflammatory changes. Spleen: Normal in size without focal abnormality. Adrenals/Urinary Tract: Right renal collecting system. No obstructive uropathy. 15 mm cyst in the upper pole of the left kidney. Further characterization not possible given the noncontrast study. Urinary bladder is physiologically distended. Stomach/Bowel: Stomach is within normal limits. Appendix appears normal. No evidence of bowel wall thickening, distention, or inflammatory changes. Vascular/Lymphatic: Aortic atherosclerosis. No enlarged abdominal or pelvic lymph nodes. The splenic and portal vein can't be adequately assessed due to the noncontrast study. Reproductive: Prostatectomy with pelvic clips in place. Other: No abdominal wall hernia or abnormality. No abdominopelvic ascites. Musculoskeletal: No acute nor suspicious osseous lesions. Thoracolumbar spondylosis. No evidence of blastic disease. IMPRESSION: 1. Three-vessel coronary arteriosclerosis. 2. Aortoiliac atherosclerosis without aneurysm. 3. Atrophy of the left hepatic lobe  with chronic moderate intrahepatic ductal dilatation slightly increased in prominence since prior exam without etiology. Small strictures are not entirely excluded. Consider repeat MRI of the abdomen. There are a few punctate peripheral calcifications which may represent tiny stones within the or adjacent to the biliary system. Right hepatic granuloma is also noted. 4. Calcified and noncalcified pleural plaque along the posterior aspect of the right thorax. Fine interstitial prominence may reflect minimal fibrosis in the right middle lobe and left lower lobe  with several small bibasilar pulmonary blebs noted. 5. Cholecystectomy. 6. Prostatectomy without evidence of osteoblastic disease. 7. 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 Probable 15 mm cyst left upper pole of the kidney. Electronically Signed   By: Ashley Royalty M.D.   On: 06/15/2017 22:57   Ct Chest Wo Contrast  Result Date: 06/15/2017 CLINICAL DATA:  Generalized weakness, loss of appetite and nausea. Chest pain for several months. History of hypertension, diabetes and renal insufficiency. EXAM: CT CHEST, ABDOMEN AND PELVIS WITHOUT CONTRAST TECHNIQUE: Multidetector CT imaging of the chest, abdomen and pelvis was performed following the standard protocol without IV contrast. COMPARISON:  CXR 01/18/2016, abdomen ultrasound 05/28/2013, chest CT 08/14/2008, MRI 07/23/2013 FINDINGS: CT CHEST FINDINGS Cardiovascular: The cardiac chambers are normal in size. No pericardial effusion. There is coronary arteriosclerosis along the LAD, circumflex and RCA. There is aortic atherosclerosis without aneurysm. The unenhanced pulmonary artery is unremarkable without main pulmonary arterial dilatation. Mediastinum/Nodes: No enlarged mediastinal, hilar, or axillary lymph nodes. Thyroid gland, trachea, and esophagus demonstrate no significant findings. A tiny 2 mm cystic nodule of the left thyroid gland is incidentally noted and stable dating back to 2009 consistent with a benign finding. Lungs/Pleura: Chronic stable apical pleuroparenchymal scarring, right greater than left with calcified and noncalcified pleural plaque or thickening along the posterior aspect of the right hemithorax. Fine interstitial changes along the periphery of the left lower lobe may represent areas of minimal fibrosis with tiny subpleural blebs at each lung base. Similar findings in the medial right middle lobe. No effusion or pneumothorax. No dominant mass. Musculoskeletal: No chest wall mass or suspicious bone lesions identified.  Degenerative changes are seen along the dorsal spine. CT ABDOMEN PELVIS FINDINGS Hepatobiliary: Status post cholecystectomy. Chronic atrophy of the left hepatic lobe is again noted with chronic moderately dilated intrahepatic ducts within the atrophic left hepatic lobe. Faint calcifications are seen within the peripheral dilated ducts suspicious for small intraductal calcifications. The degree of intrahepatic dilatation is somewhat more pronounced than on prior without definite etiology identified. The possibility of intrahepatic strictures are not entirely excluded. A granuloma is noted in the right hepatic lobe. Surgical clips are seen along the margin of the left hepatic lobe. Pancreas: Unremarkable. No pancreatic ductal dilatation or surrounding inflammatory changes. Spleen: Normal in size without focal abnormality. Adrenals/Urinary Tract: Right renal collecting system. No obstructive uropathy. 15 mm cyst in the upper pole of the left kidney. Further characterization not possible given the noncontrast study. Urinary bladder is physiologically distended. Stomach/Bowel: Stomach is within normal limits. Appendix appears normal. No evidence of bowel wall thickening, distention, or inflammatory changes. Vascular/Lymphatic: Aortic atherosclerosis. No enlarged abdominal or pelvic lymph nodes. The splenic and portal vein can't be adequately assessed due to the noncontrast study. Reproductive: Prostatectomy with pelvic clips in place. Other: No abdominal wall hernia or abnormality. No abdominopelvic ascites. Musculoskeletal: No acute nor suspicious osseous lesions. Thoracolumbar spondylosis. No evidence of blastic disease. IMPRESSION: 1. Three-vessel coronary arteriosclerosis. 2. Aortoiliac atherosclerosis without aneurysm. 3.  Atrophy of the left hepatic lobe with chronic moderate intrahepatic ductal dilatation slightly increased in prominence since prior exam without etiology. Small strictures are not entirely excluded.  Consider repeat MRI of the abdomen. There are a few punctate peripheral calcifications which may represent tiny stones within the or adjacent to the biliary system. Right hepatic granuloma is also noted. 4. Calcified and noncalcified pleural plaque along the posterior aspect of the right thorax. Fine interstitial prominence may reflect minimal fibrosis in the right middle lobe and left lower lobe with several small bibasilar pulmonary blebs noted. 5. Cholecystectomy. 6. Prostatectomy without evidence of osteoblastic disease. 7. 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 Probable 15 mm cyst left upper pole of the kidney. Electronically Signed   By: Ashley Royalty M.D.   On: 06/15/2017 22:57   Mr Abdomen Mrcp Wo Contrast  Result Date: 06/17/2017 CLINICAL DATA:  Weight loss, fatigue, nausea, and new primarily direct hyperbilirubinemia. Prior sphincterotomy and prior laparoscopic cholecystectomy. Filling defect or stricture in the central left intrahepatic duct on the intraoperative cholangiogram in 2015. EXAM: MRI ABDOMEN WITHOUT CONTRAST  (INCLUDING MRCP) TECHNIQUE: Multiplanar multisequence MR imaging of the abdomen was performed. Heavily T2-weighted images of the biliary and pancreatic ducts were obtained, and three-dimensional MRCP images were rendered by post processing. COMPARISON:  Multiple exams, including CT scan 06/15/2017 and intraoperative cholangiogram from 02/04/2014 FINDINGS: Lower chest: Unremarkable Hepatobiliary: Prominent left and moderate to prominent right intrahepatic biliary dilatation due to a 3.9 by 2.4 by 3.1 cm primarily intraluminal mass involving the left hepatic duct, common hepatic duct, and right hepatic duct shown on images 26-27 of series 5 and also image 19 series 3. There is associated chronic atrophy in the left hepatic lobe. The intraluminal filling defect is larger than on 07/23/2013, and is currently obstructing the right side in addition to the left. The  mass extends nearly to the level of the common bile duct. The CBD distal to the mass is of normal caliber. I do not see a separate mass in the liver. Pancreas:  Unremarkable Spleen:  Unremarkable Adrenals/Urinary Tract: Hydronephrosis involving the upper pole the right kidney with a large adjacent extrarenal pelvis, but no involvement of the lower pole, query duplicated collecting system. This is a similar appearance to the prior exam. A 1.6 by 1.6 cm cystic lesion of the left kidney upper pole has diffuse high T1 signal along with non dependent high T2 and dependent low T2 signal, favoring a complex cyst. Stomach/Bowel: Unremarkable Vascular/Lymphatic: Aortoiliac atherosclerotic vascular disease. Small porta hepatis lymph nodes do not appear pathologically enlarged. Other:  No supplemental non-categorized findings. Musculoskeletal: Dextroconvex lumbar scoliosis with associated spondylosis and degenerative disc disease IMPRESSION: 1. Intraluminal mass extending in the common hepatic duct, left hepatic duct, and right hepatic duct in causing biliary obstruction, with considerable biliary dilatation. This represents a moderate progression compared to the 2014 MRI. This is likely chronic on the left side and is causing some atrophy of the left side of the liver. Clearly cholangiocarcinoma would be the main concern, but it would be unusual for this to be present since 2014 without significantly greater progression than what is present in this case. This raises the possibility of other types of biliary filling defects, including intraductal papillary tumor, blood products, sarcoidosis, and liver fluke parasitic infection. Tissue diagnosis is likely warranted. 2. Chronic hydronephrosis involving the upper pole the right kidney, but not involving the lower pole, query duplicated collecting system. 3. Complex cystic lesion of the  left kidney upper pole with high precontrast T1 signal. A Bosniak classification cannot be  assigned due to the lack of contrast. Electronically Signed   By: Van Clines M.D.   On: 06/17/2017 09:33   Mr 3d Recon At Scanner  Result Date: 06/17/2017 CLINICAL DATA:  Weight loss, fatigue, nausea, and new primarily direct hyperbilirubinemia. Prior sphincterotomy and prior laparoscopic cholecystectomy. Filling defect or stricture in the central left intrahepatic duct on the intraoperative cholangiogram in 2015. EXAM: MRI ABDOMEN WITHOUT CONTRAST  (INCLUDING MRCP) TECHNIQUE: Multiplanar multisequence MR imaging of the abdomen was performed. Heavily T2-weighted images of the biliary and pancreatic ducts were obtained, and three-dimensional MRCP images were rendered by post processing. COMPARISON:  Multiple exams, including CT scan 06/15/2017 and intraoperative cholangiogram from 02/04/2014 FINDINGS: Lower chest: Unremarkable Hepatobiliary: Prominent left and moderate to prominent right intrahepatic biliary dilatation due to a 3.9 by 2.4 by 3.1 cm primarily intraluminal mass involving the left hepatic duct, common hepatic duct, and right hepatic duct shown on images 26-27 of series 5 and also image 19 series 3. There is associated chronic atrophy in the left hepatic lobe. The intraluminal filling defect is larger than on 07/23/2013, and is currently obstructing the right side in addition to the left. The mass extends nearly to the level of the common bile duct. The CBD distal to the mass is of normal caliber. I do not see a separate mass in the liver. Pancreas:  Unremarkable Spleen:  Unremarkable Adrenals/Urinary Tract: Hydronephrosis involving the upper pole the right kidney with a large adjacent extrarenal pelvis, but no involvement of the lower pole, query duplicated collecting system. This is a similar appearance to the prior exam. A 1.6 by 1.6 cm cystic lesion of the left kidney upper pole has diffuse high T1 signal along with non dependent high T2 and dependent low T2 signal, favoring a complex  cyst. Stomach/Bowel: Unremarkable Vascular/Lymphatic: Aortoiliac atherosclerotic vascular disease. Small porta hepatis lymph nodes do not appear pathologically enlarged. Other:  No supplemental non-categorized findings. Musculoskeletal: Dextroconvex lumbar scoliosis with associated spondylosis and degenerative disc disease IMPRESSION: 1. Intraluminal mass extending in the common hepatic duct, left hepatic duct, and right hepatic duct in causing biliary obstruction, with considerable biliary dilatation. This represents a moderate progression compared to the 2014 MRI. This is likely chronic on the left side and is causing some atrophy of the left side of the liver. Clearly cholangiocarcinoma would be the main concern, but it would be unusual for this to be present since 2014 without significantly greater progression than what is present in this case. This raises the possibility of other types of biliary filling defects, including intraductal papillary tumor, blood products, sarcoidosis, and liver fluke parasitic infection. Tissue diagnosis is likely warranted. 2. Chronic hydronephrosis involving the upper pole the right kidney, but not involving the lower pole, query duplicated collecting system. 3. Complex cystic lesion of the left kidney upper pole with high precontrast T1 signal. A Bosniak classification cannot be assigned due to the lack of contrast. Electronically Signed   By: Van Clines M.D.   On: 06/17/2017 09:33    Labs:  CBC:  Recent Labs  06/15/17 1656 06/16/17 0426 06/17/17 0411 06/18/17 0530  WBC 7.2 6.9 7.1 7.1  HGB 8.5* 8.2* 8.0* 8.0*  HCT 25.0* 24.3* 23.4* 22.9*  PLT 140* 121* 115* 117*    COAGS: No results for input(s): INR, APTT in the last 8760 hours.  BMP:  Recent Labs  06/16/17 0426 06/16/17 1400 06/17/17 0411  06/18/17 0530  NA 124* 122* 124* 123*  K 4.3 4.6 4.5 4.5  CL 96* 92* 97* 95*  CO2 20* 21* 20* 20*  GLUCOSE 112* 158* 93 126*  BUN 28* 27* 34* 42*    CALCIUM 8.6* 8.5* 8.7* 8.5*  CREATININE 1.83* 1.79* 1.92* 2.33*  GFRNONAA 31* 32* 29* 23*  GFRAA 36* 37* 34* 27*    LIVER FUNCTION TESTS:  Recent Labs  06/15/17 1656 06/16/17 0426 06/17/17 0411 06/18/17 0530  BILITOT 3.1* 3.2*  3.0* 3.0* 3.0*  AST 50* 48* 48* 58*  ALT 61 54 55 61  ALKPHOS 160* 150* 148* 160*  PROT 6.8 6.2* 6.2* 6.2*  ALBUMIN 3.4* 2.9* 2.9* 2.9*    TUMOR MARKERS: No results for input(s): AFPTM, CEA, CA199, CHROMGRNA in the last 8760 hours.  Assessment and Plan: Obstructive jaundice, intraluminal mass of common, left, and right hepatic duct causing biliary obstruction.  Patient with history of nausea, poor appetite, itching, and jaundice presents with hyponatremia.   MRCP shows progression of biliary obstruction.  IR consulted for cholangiogram and internal/external biliary drain placement at the request of Dr. Havery Moros.  Case reviewed by Dr. Laurence Ferrari who approves patient for procedure.  Patient will be NPO after midnight tonight in anticipation of procedure tomorrow.  Risks and benefits discussed with the patient including, but not limited to bleeding, infection which may lead to sepsis or even death and damage to adjacent structures. All of the patient's questions were answered, patient is agreeable to proceed. Consent signed and in chart.  Thank you for this interesting consult.  I greatly enjoyed meeting Gerald Jacobs and look forward to participating in their care.  A copy of this report was sent to the requesting provider on this date.  Electronically Signed: Docia Barrier, PA 06/18/2017, 10:14 AM   I spent a total of 40 Minutes    in face to face in clinical consultation, greater than 50% of which was counseling/coordinating care for biliary obstruction.

## 2017-06-19 DIAGNOSIS — D696 Thrombocytopenia, unspecified: Secondary | ICD-10-CM

## 2017-06-19 LAB — CBC
HEMATOCRIT: 26.8 % — AB (ref 39.0–52.0)
HEMOGLOBIN: 9 g/dL — AB (ref 13.0–17.0)
MCH: 30.7 pg (ref 26.0–34.0)
MCHC: 33.6 g/dL (ref 30.0–36.0)
MCV: 91.5 fL (ref 78.0–100.0)
Platelets: 228 10*3/uL (ref 150–400)
RBC: 2.93 MIL/uL — AB (ref 4.22–5.81)
RDW: 13.8 % (ref 11.5–15.5)
WBC: 10.7 10*3/uL — AB (ref 4.0–10.5)

## 2017-06-19 LAB — BASIC METABOLIC PANEL
ANION GAP: 8 (ref 5–15)
BUN: 44 mg/dL — ABNORMAL HIGH (ref 6–20)
CHLORIDE: 99 mmol/L — AB (ref 101–111)
CO2: 21 mmol/L — ABNORMAL LOW (ref 22–32)
Calcium: 8.7 mg/dL — ABNORMAL LOW (ref 8.9–10.3)
Creatinine, Ser: 2.07 mg/dL — ABNORMAL HIGH (ref 0.61–1.24)
GFR calc Af Amer: 31 mL/min — ABNORMAL LOW (ref >60)
GFR, EST NON AFRICAN AMERICAN: 27 mL/min — AB (ref >60)
Glucose, Bld: 157 mg/dL — ABNORMAL HIGH (ref 65–99)
POTASSIUM: 4.4 mmol/L (ref 3.5–5.1)
SODIUM: 128 mmol/L — AB (ref 135–145)

## 2017-06-19 LAB — GLUCOSE, CAPILLARY
GLUCOSE-CAPILLARY: 112 mg/dL — AB (ref 65–99)
GLUCOSE-CAPILLARY: 154 mg/dL — AB (ref 65–99)
Glucose-Capillary: 132 mg/dL — ABNORMAL HIGH (ref 65–99)
Glucose-Capillary: 125 mg/dL — ABNORMAL HIGH (ref 65–99)

## 2017-06-19 LAB — PROTIME-INR
INR: 1.06
PROTHROMBIN TIME: 13.8 s (ref 11.4–15.2)

## 2017-06-19 LAB — APTT: APTT: 32 s (ref 24–36)

## 2017-06-19 NOTE — Care Management Important Message (Signed)
Important Message  Patient Details  Name: Gerald Jacobs MRN: [id number] Date of Birth: [birth year]   Medicare Important Message Given:  Yes    Nathen May 06/19/2017, 9:46 AM

## 2017-06-19 NOTE — Progress Notes (Signed)
     [facility] Progress Note  Chief Complaint:    Abnormal liver labs and CT scan  Subjective: Feels okay but itching.   Objective:  Vital signs in last 24 hours: Temp:  [98.1 F (36.7 C)-98.3 F (36.8 C)] 98.1 F (36.7 C) (07/23 0541) Pulse Rate:  [59-64] 64 (07/23 0541) Resp:  [17-20] 17 (07/23 0541) BP: (126-138)/(54-59) 126/54 (07/23 0541) SpO2:  [100 %] 100 % (07/23 0541) Last BM Date: 06/17/17 General:   Alert, well-developed, white male in NAD Heart:  Regular rate and rhythm;no lower extremity edema Pulm: Normal respiratory effort Abdomen:  Soft, nondistended, nontender.  Normal bowel sounds Neurologic:  Alert and  oriented x4;  grossly normal neurologically. Psych:  Pleasant, cooperative.  Normal mood and affect.   Intake/Output from previous day: 07/22 0701 - 07/23 0700 In: 766.8 [P.O.:478; I.V.:288.8] Out: 1925 [Urine:1925] Intake/Output this shift: No intake/output data recorded.  Lab Results:  Recent Labs  06/17/17 0411 06/18/17 0530 06/19/17 0835  WBC 7.1 7.1 10.7*  HGB 8.0* 8.0* 9.0*  HCT 23.4* 22.9* 26.8*  PLT 115* 117* 228   BMET  Recent Labs  06/17/17 0411 06/18/17 0530 06/19/17 0835  NA 124* 123* 128*  K 4.5 4.5 4.4  CL 97* 95* 99*  CO2 20* 20* 21*  GLUCOSE 93 126* 157*  BUN 34* 42* 44*  CREATININE 1.92* 2.33* 2.07*  CALCIUM 8.7* 8.5* 8.7*   LFT  Recent Labs  06/18/17 0530  PROT 6.2*  ALBUMIN 2.9*  AST 58*  ALT 61  ALKPHOS 160*  BILITOT 3.0*    ASSESSMENT / PLAN:   1. Biliary obstruction. MRCP suggests intrahepatic mass of left lobe with causing dilation of CH, RHD and LHD. Though progressive, the left intrahepatic duct dilation dates back to 2014 when patient had lap chole with cholangiogram suggesting filling defect in central left hepatic duct (see my consult note).   -for biliary drainage and biopsy of intraluminal mass  2.  Hyponatremia, improving. Na 128 today. Etiology? Workup in progress.    3.  Chronic normocytic anemia, heme positive stools. He hasn't had iron infusions nor Procrit in 6 months. He has outpatient EGD / colonoscopy scheduled with [place] in September. Hgb stable.   4. Thrombocytopenia, resolved.   Principal Problem:   Hyponatremia Active Problems:   Type 2 diabetes mellitus with stage 4 chronic kidney disease, without long-term current use of insulin (HCC)   CKD (chronic kidney disease), stage IV (HCC)   Normocytic anemia   Essential hypertension   Weight loss   Hyperbilirubinemia   Liver mass     LOS: 4 days   Tye Savoy ,NP 06/19/2017, 9:40 AM  Pager number [phone]

## 2017-06-19 NOTE — Consult Note (Signed)
Gerald Jacobs Admit Date: 06/15/2017 06/19/2017 Gerald Jacobs Requesting Physician:  Dr. Charlies Jacobs   Reason for Consult:  Hyponatremia, CKD IV  HPI:   Patient is an 83 yo M with a pmhx of HTN, CAD, DM, anemia, GERD, prostate cancer, and gout who presents after routine labs returned abnormal. Over the past month and a half, patient has had worsening weakness, decreased appetite, nausea, dizziness. He has been worked up by his PCP, who suspected that this was from anemia, and had referred him for EGD and colonoscopy which are pending. Last week, a routine CMP and CBC showed new hyponatremia to 122 mmol/L (from baseline ~140) and progression of his anemia from near 12 g/dL in April to 8.5 g/dL now. Patient underwent follow up lab work on 7/19 which revealed Na 119 mmol/L and he instructed to go to the ER. He reports starting amlodipine recently per his cardiologist but denied any other new medications. History is notable for a progressive failure to thrive for the past 6 months with a mild weight loss (5-10 lbs over the past year), decreased energy, nausea, and poor appetite.   PMH Incudes:  HTN  HLD  Type 2 DM  CKD IV  Prostate cancer     Creat (mg/dL)  Date Value  05/20/2013 2.11 (H)   Creatinine, Ser (mg/dL)  Date Value  06/19/2017 2.07 (H)  06/18/2017 2.33 (H)  06/17/2017 1.92 (H)  06/16/2017 1.79 (H)  06/16/2017 1.83 (H)  06/15/2017 1.92 (H)  06/15/2017 1.80 (H)  06/13/2017 1.89 (H)  02/08/2017 2.18 (H)  01/30/2017 2.29 (H)  ] I/Os:   Intake/Output Summary (Last 24 hours) at 06/19/17 1130 Last data filed at 06/19/17 0545  Gross per 24 hour  Intake           408.75 ml  Output             1375 ml  Net          -966.25 ml     ROS NSAIDS: None  IV Contrast None TMP/SMX None Hypotension None Balance of 12 systems is negative w/ exceptions as above  PMH  Past Medical History:  Diagnosis Date  . Cancer [facility])    prostate  . Chronic anemia    With normal  iron studies and normal serum protein electrophoresis  . Coronary artery disease    Mild  . Diabetes mellitus    Adult onset  . Essential hypertension   . GERD (gastroesophageal reflux disease)    occ tums  . History of gout   . Mild renal insufficiency   . Nocturia    x2  . Osteoarthritis    PSH  Past Surgical History:  Procedure Laterality Date  . CHOLECYSTECTOMY  02/04/2014   DR Zella Richer  . CHOLECYSTECTOMY N/A 02/04/2014   Procedure: LAPAROSCOPIC CHOLECYSTECTOMY with intraoperative cholangiogram;  Surgeon: Odis Hollingshead, MD;  Location: [facility];  Service: General;  Laterality: N/A;  . ERCP N/A 10/15/2013   Procedure: ENDOSCOPIC RETROGRADE CHOLANGIOPANCREATOGRAPHY (ERCP);  Surgeon: Jeryl Columbia, MD;  Location: [facility];  Service: Endoscopy;  Laterality: N/A;  . KNEE SURGERY Right 1979  . melanoma surgery  1970's   on scalp  . PROSTATE SURGERY  1993   Prostate Cancer  . SPHINCTEROTOMY  10/15/2013   Procedure: SPHINCTEROTOMY;  Surgeon: Jeryl Columbia, MD;  Location: [facility];  Service: Endoscopy;;   FH  Family History  Problem Relation Age of Onset  . Stroke Father   . Stroke Mother   .  Stroke Sister   . Stroke Brother   . Stroke Brother   . Stroke Brother    [place]  reports that he quit smoking about 37 years ago. His smoking use included Cigarettes. He has a 45.00 pack-year smoking history. He has never used smokeless tobacco. He reports that he drinks alcohol. He reports that he does not use drugs. Allergies  Allergies  Allergen Reactions  . Colchicine Other (See Comments)    GI problems  . Flexeril [Cyclobenzaprine Hcl] Other (See Comments)    GI problems   . Levaquin [Levofloxacin In D5w] Other (See Comments)    Insomnia   . Percocet [Oxycodone-Acetaminophen] Nausea And Vomiting  . Zebeta Other (See Comments)    Gi problems   Home medications Prior to Admission medications   Medication Sig Start Date End Date Taking? Authorizing Provider   allopurinol (ZYLOPRIM) 100 MG tablet TAKE 1 TABLET (100 MG TOTAL) BY MOUTH DAILY. 03/15/17  Yes Golden Circle, FNP  amLODipine (NORVASC) 2.5 MG tablet Take 1 tablet (2.5 mg total) by mouth daily. Patient taking differently: Take 2.5 mg by mouth at bedtime.  03/15/17 06/15/17 Yes Nahser, Wonda Cheng, MD  Biotin 1000 MCG tablet Take 1,000 mcg by mouth daily.   Yes [provider]  calcium carbonate (TUMS - DOSED IN MG ELEMENTAL CALCIUM) 500 MG chewable tablet Chew 1 tablet by mouth 2 (two) times daily as needed for indigestion or heartburn.   Yes [provider]  diphenhydramine-acetaminophen (TYLENOL PM) 25-500 MG TABS Take 1 tablet by mouth at bedtime as needed (sleep).   Yes [provider]  L-Lysine 500 MG CAPS Take 500 mg by mouth 2 (two) times daily.   Yes [provider]  losartan-hydrochlorothiazide (HYZAAR) 100-12.5 MG tablet TAKE 1/2 TABLET BY MOUTH DAILY 03/09/17  Yes Burtis Junes, NP  metoprolol succinate (TOPROL-XL) 50 MG 24 hr tablet Take 50 mg by mouth every morning.  01/04/17  Yes [provider]  Omega-3 Fatty Acids (FISH OIL PO) Take 1 capsule by mouth daily.   Yes [provider]  saxagliptin HCl (ONGLYZA) 2.5 MG TABS tablet Take 2.5 mg by mouth daily.   Yes [provider]  aspirin 81 MG tablet Take 81 mg by mouth daily.      [provider]  glucose blood test strip Use one strip per test. Check blood sugars 1-4 times per day as instructed. 01/18/16   Golden Circle, FNP  ONETOUCH DELICA LANCETS 31V MISC TEST BLOOD SUGARS 1-4 TIMES A DAY AS INSTRUCTED. DX CODE: E11.9 03/29/17   Golden Circle, FNP    Current Medications Scheduled Meds: . allopurinol  100 mg Oral Daily  . amLODipine  10 mg Oral QHS  . feeding supplement (GLUCERNA SHAKE)  237 mL Oral BID BM  . insulin aspart  0-5 Units Subcutaneous QHS  . insulin aspart  0-9 Units Subcutaneous TID WC  . metoprolol succinate  100 mg Oral q morning - 10a    Continuous Infusions: . sodium chloride 0.9 % 1,000 mL infusion 75 mL/hr at 06/19/17 0135   PRN Meds:.acetaminophen **OR** acetaminophen, alum & mag hydroxide-simeth, ondansetron **OR** ondansetron (ZOFRAN) IV  CBC  Recent Labs Lab 06/17/17 0411 06/18/17 0530 06/19/17 0835  WBC 7.1 7.1 10.7*  HGB 8.0* 8.0* 9.0*  HCT 23.4* 22.9* 26.8*  MCV 90.7 90.5 91.5  PLT 115* 117* 400   Basic Metabolic Panel  Recent Labs Lab 06/13/17 1022 06/15/17 1444 06/15/17 1656 06/16/17 0426 06/16/17  1400 06/17/17 0411 06/18/17 0530 06/19/17 0835  NA 122* 119* 122* 124* 122* 124* 123* 128*  K 4.7 4.5 4.7 4.3 4.6 4.5 4.5 4.4  CL 91* 92* 95* 96* 92* 97* 95* 99*  CO2 22 21 20* 20* 21* 20* 20* 21*  GLUCOSE 184* 140* 118* 112* 158* 93 126* 157*  BUN 27* 29* 27* 28* 27* 34* 42* 44*  CREATININE 1.89* 1.80* 1.92* 1.83* 1.79* 1.92* 2.33* 2.07*  CALCIUM 9.0 8.6 8.7* 8.6* 8.5* 8.7* 8.5* 8.7*  PHOS 3.3 3.9  --   --   --   --   --   --     Physical Exam  Blood pressure (!) 126/54, pulse 64, temperature 98.1 F (36.7 C), resp. rate 17, height 5\' 7"  (1.702 m), weight 141 lb 6.4 oz (64.1 kg), SpO2 100 %. GEN: Awake and alert, NAD CV: RRR PULM: CTAB ABD: Soft, non tender, normal BS SKIN: No rash EXT: No edema    Assessment  1. Hyponatremia: Serum Osm low at 268, urine sodium 43. Currently appears euvolemic, but suspect he was likely volume down on arrival given poor PO intake and failure to thrive. Overall improved with IVFs (122 -> 128).  2. CKD IV: Stable, near baseline. Treating hyponatremia above, potassium WNL. Continues to have good UOP.  3. Anemia: Likely chronic, iron stores appear sufficient. Planning for outpatient colonoscopy EGD.  4. HTN: On amlodipine and metoprolol   Plan 1. Continue NS, goal correction < 8 mmol/L over the next 24 hours. Hold home losartan-HCTZ. F/u AM BMP.  2. Daily labs, avoid nephrotoxic meds.  3. Other issues per PCCM   Gerald Jacobs, M.D. - PGY2 Pager:  [phone] 06/19/2017, 11:48 AM

## 2017-06-19 NOTE — Progress Notes (Signed)
I spoke with the advanced endoscopy fellow at [facility] late afternoon.  They would like the patient to be transferred to [facility] tomorrow as an inpatient if beds available.  The transfer line number is [id number] The patient's [facility] MRN is AV [id number] ( he was apparently there once for a minor procedure).  He would go to the medicine service there to be seen by GI hepatobiliary service, Dr Luciana Axe (Fellow)  We will ask radiology to copy his MRCP/MRI abdomen and prior ERCP images onto a disc for patient to bring as well as transfer the studies electronically if possible.  Mr Millspaugh and his extended family were updated at the bedside on all this.  Wilfrid Lund, MD [facility]

## 2017-06-19 NOTE — Progress Notes (Signed)
Patient ID: Gerald Jacobs, male   DOB: 09-Feb-1925, 83 y.o.   MRN: [id number]  PROGRESS NOTE    Gerald Jacobs  VZC:[id number] DOB: Mar 19, 1925 DOA: 06/15/2017  PCP: Golden Circle, FNP   Brief Narrative:  83 year old male with NIDDM, CKD stage 4 with baseline Cr 2.2, hypertensin who presented to ED with abnormal sodium. . Pt was hemodynamically stable. Sodium was 122, bilirubin was 3.1 and ALP 160.   Assessment & Plan:   Principal Problem:   Hyponatremia, euvolemic hypo-osmolar  - New hyponatremia, unclear etiology, ?from CKD or dehydration  - Sodium level pending this am - Osm serum 268 but urine sodium WNL - Appreciate renal consultation   Active Problems:   Type 2 diabetes mellitus with stage 4 chronic kidney disease, without long-term current use of insulin (HCC) - Continue SSI    CKD (chronic kidney disease), stage IV (HCC) - BMP pending this am - Renal consultedl appreciated     Anemia of chronic disease - Hgb stable     Essential hypertension - Continue Norvasc and metoprolol     Hyperbilirubinemia / Obstructive jaundice  - Has had cholecystectomy in past - Plan for cholangiogram and biliary drain placement this am    Thrombocytopenia - D/C Lovenox subQ - Platelets stable - Follow up CBC in am  DVT prophylaxis: SCD's Code Status: full code  Family Communication: no family at the bedside this am Disposition Plan: cholangiogram this am   Consultants:   GI  IR  Procedures:   None  Antimicrobials:   None    Subjective: No overnight events.   Objective: Vitals:   06/18/17 0852 06/18/17 1419 06/18/17 2116 06/19/17 0541  BP: (!) 126/53 (!) 128/54 (!) 138/59 (!) 126/54  Pulse: (!) 56 (!) 59 60 64  Resp:  20 17 17   Temp:  98.3 F (36.8 C) 98.2 F (36.8 C) 98.1 F (36.7 C)  TempSrc:  Oral    SpO2:  100% 100% 100%  Weight:      Height:        Intake/Output Summary (Last 24 hours) at 06/19/17 0955 Last data filed at 06/19/17 0545  Gross per 24 hour  Intake           408.75 ml  Output             1925 ml  Net         -1516.25 ml   Filed Weights   06/15/17 2120  Weight: 64.1 kg (141 lb 6.4 oz)   Physical Exam  Constitutional: Appears well-developed and well-nourished. No distress.  CVS: RRR, S1/S2 + Pulmonary: Effort and breath sounds normal, no stridor Abdominal: Soft. BS +,  no distension, tenderness, rebound or guarding.  Musculoskeletal: Normal range of motion. No edema and no tenderness.  Lymphadenopathy: No lymphadenopathy noted, cervical, inguinal. Neuro: Alert. Normal reflexes, muscle tone coordination. No cranial nerve deficit. Skin: Skin is warm and dry.   Psychiatric: Normal mood and affect.    Data Reviewed: I have personally reviewed following labs and imaging studies  CBC:  Recent Labs Lab 06/15/17 1656 06/16/17 0426 06/17/17 0411 06/18/17 0530 06/19/17 0835  WBC 7.2 6.9 7.1 7.1 10.7*  HGB 8.5* 8.2* 8.0* 8.0* 9.0*  HCT 25.0* 24.3* 23.4* 22.9* 26.8*  MCV 91.6 90.7 90.7 90.5 91.5  PLT 140* 121* 115* 117* 128   Basic Metabolic Panel:  Recent Labs Lab 06/13/17 1022 06/15/17 1444  06/16/17 0426 06/16/17 1400 06/17/17 0411 06/18/17 0530 06/19/17  0835  NA 122* 119*  < > 124* 122* 124* 123* 128*  K 4.7 4.5  < > 4.3 4.6 4.5 4.5 4.4  CL 91* 92*  < > 96* 92* 97* 95* 99*  CO2 22 21  < > 20* 21* 20* 20* 21*  GLUCOSE 184* 140*  < > 112* 158* 93 126* 157*  BUN 27* 29*  < > 28* 27* 34* 42* 44*  CREATININE 1.89* 1.80*  < > 1.83* 1.79* 1.92* 2.33* 2.07*  CALCIUM 9.0 8.6  < > 8.6* 8.5* 8.7* 8.5* 8.7*  PHOS 3.3 3.9  --   --   --   --   --   --   < > = values in this interval not displayed. GFR: Estimated Creatinine Clearance: 21.9 mL/min (A) (by C-G formula based on SCr of 2.07 mg/dL (H)). Liver Function Tests:  Recent Labs Lab 06/13/17 1022 06/15/17 1444 06/15/17 1656 06/16/17 0426 06/17/17 0411 06/18/17 0530  AST 43*  --  50* 48* 48* 58*  ALT 56*  --  61 54 55 61  ALKPHOS 171*   --  160* 150* 148* 160*  BILITOT 2.4*  --  3.1* 3.2*  3.0* 3.0* 3.0*  PROT 6.8  --  6.8 6.2* 6.2* 6.2*  ALBUMIN 3.7  3.7 3.5 3.4* 2.9* 2.9* 2.9*    Recent Labs Lab 06/13/17 1022  LIPASE 65.0*  AMYLASE 48   No results for input(s): AMMONIA in the last 168 hours. Coagulation Profile: No results for input(s): INR, PROTIME in the last 168 hours. Cardiac Enzymes: No results for input(s): CKTOTAL, CKMB, CKMBINDEX, TROPONINI in the last 168 hours. BNP (last 3 results) No results for input(s): PROBNP in the last 8760 hours. HbA1C: No results for input(s): HGBA1C in the last 72 hours. CBG:  Recent Labs Lab 06/18/17 0821 06/18/17 1207 06/18/17 1628 06/18/17 2118 06/19/17 0806  GLUCAP 146* 172* 130* 141* 132*   Lipid Profile: No results for input(s): CHOL, HDL, LDLCALC, TRIG, CHOLHDL, LDLDIRECT in the last 72 hours. Thyroid Function Tests: No results for input(s): TSH, T4TOTAL, FREET4, T3FREE, THYROIDAB in the last 72 hours. Anemia Panel: No results for input(s): VITAMINB12, FOLATE, FERRITIN, TIBC, IRON, RETICCTPCT in the last 72 hours. Urine analysis:    Component Value Date/Time   COLORURINE YELLOW 06/15/2017 1700   APPEARANCEUR CLEAR 06/15/2017 1700   LABSPEC 1.005 06/15/2017 1700   PHURINE 6.0 06/15/2017 1700   GLUCOSEU NEGATIVE 06/15/2017 1700   HGBUR NEGATIVE 06/15/2017 1700   BILIRUBINUR NEGATIVE 06/15/2017 1700   KETONESUR NEGATIVE 06/15/2017 1700   PROTEINUR 100 (A) 06/15/2017 1700   UROBILINOGEN 0.2 10/16/2013 0155   NITRITE NEGATIVE 06/15/2017 1700   LEUKOCYTESUR NEGATIVE 06/15/2017 1700   Sepsis Labs: @LABRCNTIP (procalcitonin:4,lacticidven:4)    Recent Results (from the past 789 hour(s))  Helicobacter pylori special antigen     Status: None   Collection Time: 06/13/17  3:05 PM  Result Value Ref Range Status   H. PYLORI Antigen  NOT DETECTED  Final   H. PYLORI Antigen  Antimicrobials,proton pump inhibitors and bismuth  Final   H. PYLORI Antigen     Final    preparations are known to suppress H.pylori and ingestion of   H. PYLORI Antigen    Final    these prior to testing may cause a false negative result. If   H. PYLORI Antigen    Final    a negative result is obtained for a patient that has    H. PYLORI Antigen  ingested these compounds within two weeks prior of  Final   H. PYLORI Antigen    Final    performing the H.pylori test, results may be falsely   H. PYLORI Antigen    Final    negative and should be repeated with a new specimen two   H. PYLORI Antigen  weeks after discontinuing treatment.  Final      Radiology Studies: Ct Abdomen Pelvis Wo Contrast  Result Date: 06/15/2017 CLINICAL DATA:  Generalized weakness, loss of appetite and nausea. Chest pain for several months. History of hypertension, diabetes and renal insufficiency. EXAM: CT CHEST, ABDOMEN AND PELVIS WITHOUT CONTRAST TECHNIQUE: Multidetector CT imaging of the chest, abdomen and pelvis was performed following the standard protocol without IV contrast. COMPARISON:  CXR 01/18/2016, abdomen ultrasound 05/28/2013, chest CT 08/14/2008, MRI 07/23/2013 FINDINGS: CT CHEST FINDINGS Cardiovascular: The cardiac chambers are normal in size. No pericardial effusion. There is coronary arteriosclerosis along the LAD, circumflex and RCA. There is aortic atherosclerosis without aneurysm. The unenhanced pulmonary artery is unremarkable without main pulmonary arterial dilatation. Mediastinum/Nodes: No enlarged mediastinal, hilar, or axillary lymph nodes. Thyroid gland, trachea, and esophagus demonstrate no significant findings. A tiny 2 mm cystic nodule of the left thyroid gland is incidentally noted and stable dating back to 2009 consistent with a benign finding. Lungs/Pleura: Chronic stable apical pleuroparenchymal scarring, right greater than left with calcified and noncalcified pleural plaque or thickening along the posterior aspect of the right hemithorax. Fine interstitial changes along  the periphery of the left lower lobe may represent areas of minimal fibrosis with tiny subpleural blebs at each lung base. Similar findings in the medial right middle lobe. No effusion or pneumothorax. No dominant mass. Musculoskeletal: No chest wall mass or suspicious bone lesions identified. Degenerative changes are seen along the dorsal spine. CT ABDOMEN PELVIS FINDINGS Hepatobiliary: Status post cholecystectomy. Chronic atrophy of the left hepatic lobe is again noted with chronic moderately dilated intrahepatic ducts within the atrophic left hepatic lobe. Faint calcifications are seen within the peripheral dilated ducts suspicious for small intraductal calcifications. The degree of intrahepatic dilatation is somewhat more pronounced than on prior without definite etiology identified. The possibility of intrahepatic strictures are not entirely excluded. A granuloma is noted in the right hepatic lobe. Surgical clips are seen along the margin of the left hepatic lobe. Pancreas: Unremarkable. No pancreatic ductal dilatation or surrounding inflammatory changes. Spleen: Normal in size without focal abnormality. Adrenals/Urinary Tract: Right renal collecting system. No obstructive uropathy. 15 mm cyst in the upper pole of the left kidney. Further characterization not possible given the noncontrast study. Urinary bladder is physiologically distended. Stomach/Bowel: Stomach is within normal limits. Appendix appears normal. No evidence of bowel wall thickening, distention, or inflammatory changes. Vascular/Lymphatic: Aortic atherosclerosis. No enlarged abdominal or pelvic lymph nodes. The splenic and portal vein can't be adequately assessed due to the noncontrast study. Reproductive: Prostatectomy with pelvic clips in place. Other: No abdominal wall hernia or abnormality. No abdominopelvic ascites. Musculoskeletal: No acute nor suspicious osseous lesions. Thoracolumbar spondylosis. No evidence of blastic disease.  IMPRESSION: 1. Three-vessel coronary arteriosclerosis. 2. Aortoiliac atherosclerosis without aneurysm. 3. Atrophy of the left hepatic lobe with chronic moderate intrahepatic ductal dilatation slightly increased in prominence since prior exam without etiology. Small strictures are not entirely excluded. Consider repeat MRI of the abdomen. There are a few punctate peripheral calcifications which may represent tiny stones within the or adjacent to the biliary system. Right hepatic granuloma is also noted. 4. Calcified and noncalcified  pleural plaque along the posterior aspect of the right thorax. Fine interstitial prominence may reflect minimal fibrosis in the right middle lobe and left lower lobe with several small bibasilar pulmonary blebs noted. 5. Cholecystectomy. 6. Prostatectomy without evidence of osteoblastic disease. 7. 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 Probable 15 mm cyst left upper pole of the kidney. Electronically Signed   By: Ashley Royalty M.D.   On: 06/15/2017 22:57   Ct Chest Wo Contrast  Result Date: 06/15/2017 CLINICAL DATA:  Generalized weakness, loss of appetite and nausea. Chest pain for several months. History of hypertension, diabetes and renal insufficiency. EXAM: CT CHEST, ABDOMEN AND PELVIS WITHOUT CONTRAST TECHNIQUE: Multidetector CT imaging of the chest, abdomen and pelvis was performed following the standard protocol without IV contrast. COMPARISON:  CXR 01/18/2016, abdomen ultrasound 05/28/2013, chest CT 08/14/2008, MRI 07/23/2013 FINDINGS: CT CHEST FINDINGS Cardiovascular: The cardiac chambers are normal in size. No pericardial effusion. There is coronary arteriosclerosis along the LAD, circumflex and RCA. There is aortic atherosclerosis without aneurysm. The unenhanced pulmonary artery is unremarkable without main pulmonary arterial dilatation. Mediastinum/Nodes: No enlarged mediastinal, hilar, or axillary lymph nodes. Thyroid gland, trachea, and esophagus  demonstrate no significant findings. A tiny 2 mm cystic nodule of the left thyroid gland is incidentally noted and stable dating back to 2009 consistent with a benign finding. Lungs/Pleura: Chronic stable apical pleuroparenchymal scarring, right greater than left with calcified and noncalcified pleural plaque or thickening along the posterior aspect of the right hemithorax. Fine interstitial changes along the periphery of the left lower lobe may represent areas of minimal fibrosis with tiny subpleural blebs at each lung base. Similar findings in the medial right middle lobe. No effusion or pneumothorax. No dominant mass. Musculoskeletal: No chest wall mass or suspicious bone lesions identified. Degenerative changes are seen along the dorsal spine. CT ABDOMEN PELVIS FINDINGS Hepatobiliary: Status post cholecystectomy. Chronic atrophy of the left hepatic lobe is again noted with chronic moderately dilated intrahepatic ducts within the atrophic left hepatic lobe. Faint calcifications are seen within the peripheral dilated ducts suspicious for small intraductal calcifications. The degree of intrahepatic dilatation is somewhat more pronounced than on prior without definite etiology identified. The possibility of intrahepatic strictures are not entirely excluded. A granuloma is noted in the right hepatic lobe. Surgical clips are seen along the margin of the left hepatic lobe. Pancreas: Unremarkable. No pancreatic ductal dilatation or surrounding inflammatory changes. Spleen: Normal in size without focal abnormality. Adrenals/Urinary Tract: Right renal collecting system. No obstructive uropathy. 15 mm cyst in the upper pole of the left kidney. Further characterization not possible given the noncontrast study. Urinary bladder is physiologically distended. Stomach/Bowel: Stomach is within normal limits. Appendix appears normal. No evidence of bowel wall thickening, distention, or inflammatory changes. Vascular/Lymphatic:  Aortic atherosclerosis. No enlarged abdominal or pelvic lymph nodes. The splenic and portal vein can't be adequately assessed due to the noncontrast study. Reproductive: Prostatectomy with pelvic clips in place. Other: No abdominal wall hernia or abnormality. No abdominopelvic ascites. Musculoskeletal: No acute nor suspicious osseous lesions. Thoracolumbar spondylosis. No evidence of blastic disease. IMPRESSION: 1. Three-vessel coronary arteriosclerosis. 2. Aortoiliac atherosclerosis without aneurysm. 3. Atrophy of the left hepatic lobe with chronic moderate intrahepatic ductal dilatation slightly increased in prominence since prior exam without etiology. Small strictures are not entirely excluded. Consider repeat MRI of the abdomen. There are a few punctate peripheral calcifications which may represent tiny stones within the or adjacent to the biliary system. Right hepatic granuloma is  also noted. 4. Calcified and noncalcified pleural plaque along the posterior aspect of the right thorax. Fine interstitial prominence may reflect minimal fibrosis in the right middle lobe and left lower lobe with several small bibasilar pulmonary blebs noted. 5. Cholecystectomy. 6. Prostatectomy without evidence of osteoblastic disease. 7. 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 Probable 15 mm cyst left upper pole of the kidney. Electronically Signed   By: Ashley Royalty M.D.   On: 06/15/2017 22:57   Mr Abdomen Mrcp Wo Contrast  Result Date: 06/17/2017 CLINICAL DATA:  Weight loss, fatigue, nausea, and new primarily direct hyperbilirubinemia. Prior sphincterotomy and prior laparoscopic cholecystectomy. Filling defect or stricture in the central left intrahepatic duct on the intraoperative cholangiogram in 2015. EXAM: MRI ABDOMEN WITHOUT CONTRAST  (INCLUDING MRCP) TECHNIQUE: Multiplanar multisequence MR imaging of the abdomen was performed. Heavily T2-weighted images of the biliary and pancreatic ducts were  obtained, and three-dimensional MRCP images were rendered by post processing. COMPARISON:  Multiple exams, including CT scan 06/15/2017 and intraoperative cholangiogram from 02/04/2014 FINDINGS: Lower chest: Unremarkable Hepatobiliary: Prominent left and moderate to prominent right intrahepatic biliary dilatation due to a 3.9 by 2.4 by 3.1 cm primarily intraluminal mass involving the left hepatic duct, common hepatic duct, and right hepatic duct shown on images 26-27 of series 5 and also image 19 series 3. There is associated chronic atrophy in the left hepatic lobe. The intraluminal filling defect is larger than on 07/23/2013, and is currently obstructing the right side in addition to the left. The mass extends nearly to the level of the common bile duct. The CBD distal to the mass is of normal caliber. I do not see a separate mass in the liver. Pancreas:  Unremarkable Spleen:  Unremarkable Adrenals/Urinary Tract: Hydronephrosis involving the upper pole the right kidney with a large adjacent extrarenal pelvis, but no involvement of the lower pole, query duplicated collecting system. This is a similar appearance to the prior exam. A 1.6 by 1.6 cm cystic lesion of the left kidney upper pole has diffuse high T1 signal along with non dependent high T2 and dependent low T2 signal, favoring a complex cyst. Stomach/Bowel: Unremarkable Vascular/Lymphatic: Aortoiliac atherosclerotic vascular disease. Small porta hepatis lymph nodes do not appear pathologically enlarged. Other:  No supplemental non-categorized findings. Musculoskeletal: Dextroconvex lumbar scoliosis with associated spondylosis and degenerative disc disease IMPRESSION: 1. Intraluminal mass extending in the common hepatic duct, left hepatic duct, and right hepatic duct in causing biliary obstruction, with considerable biliary dilatation. This represents a moderate progression compared to the 2014 MRI. This is likely chronic on the left side and is causing some  atrophy of the left side of the liver. Clearly cholangiocarcinoma would be the main concern, but it would be unusual for this to be present since 2014 without significantly greater progression than what is present in this case. This raises the possibility of other types of biliary filling defects, including intraductal papillary tumor, blood products, sarcoidosis, and liver fluke parasitic infection. Tissue diagnosis is likely warranted. 2. Chronic hydronephrosis involving the upper pole the right kidney, but not involving the lower pole, query duplicated collecting system. 3. Complex cystic lesion of the left kidney upper pole with high precontrast T1 signal. A Bosniak classification cannot be assigned due to the lack of contrast. Electronically Signed   By: Van Clines M.D.   On: 06/17/2017 09:33   Mr 3d Recon At Scanner  Result Date: 06/17/2017 CLINICAL DATA:  Weight loss, fatigue, nausea, and new primarily direct  hyperbilirubinemia. Prior sphincterotomy and prior laparoscopic cholecystectomy. Filling defect or stricture in the central left intrahepatic duct on the intraoperative cholangiogram in 2015. EXAM: MRI ABDOMEN WITHOUT CONTRAST  (INCLUDING MRCP) TECHNIQUE: Multiplanar multisequence MR imaging of the abdomen was performed. Heavily T2-weighted images of the biliary and pancreatic ducts were obtained, and three-dimensional MRCP images were rendered by post processing. COMPARISON:  Multiple exams, including CT scan 06/15/2017 and intraoperative cholangiogram from 02/04/2014 FINDINGS: Lower chest: Unremarkable Hepatobiliary: Prominent left and moderate to prominent right intrahepatic biliary dilatation due to a 3.9 by 2.4 by 3.1 cm primarily intraluminal mass involving the left hepatic duct, common hepatic duct, and right hepatic duct shown on images 26-27 of series 5 and also image 19 series 3. There is associated chronic atrophy in the left hepatic lobe. The intraluminal filling defect is larger  than on 07/23/2013, and is currently obstructing the right side in addition to the left. The mass extends nearly to the level of the common bile duct. The CBD distal to the mass is of normal caliber. I do not see a separate mass in the liver. Pancreas:  Unremarkable Spleen:  Unremarkable Adrenals/Urinary Tract: Hydronephrosis involving the upper pole the right kidney with a large adjacent extrarenal pelvis, but no involvement of the lower pole, query duplicated collecting system. This is a similar appearance to the prior exam. A 1.6 by 1.6 cm cystic lesion of the left kidney upper pole has diffuse high T1 signal along with non dependent high T2 and dependent low T2 signal, favoring a complex cyst. Stomach/Bowel: Unremarkable Vascular/Lymphatic: Aortoiliac atherosclerotic vascular disease. Small porta hepatis lymph nodes do not appear pathologically enlarged. Other:  No supplemental non-categorized findings. Musculoskeletal: Dextroconvex lumbar scoliosis with associated spondylosis and degenerative disc disease IMPRESSION: 1. Intraluminal mass extending in the common hepatic duct, left hepatic duct, and right hepatic duct in causing biliary obstruction, with considerable biliary dilatation. This represents a moderate progression compared to the 2014 MRI. This is likely chronic on the left side and is causing some atrophy of the left side of the liver. Clearly cholangiocarcinoma would be the main concern, but it would be unusual for this to be present since 2014 without significantly greater progression than what is present in this case. This raises the possibility of other types of biliary filling defects, including intraductal papillary tumor, blood products, sarcoidosis, and liver fluke parasitic infection. Tissue diagnosis is likely warranted. 2. Chronic hydronephrosis involving the upper pole the right kidney, but not involving the lower pole, query duplicated collecting system. 3. Complex cystic lesion of the  left kidney upper pole with high precontrast T1 signal. A Bosniak classification cannot be assigned due to the lack of contrast. Electronically Signed   By: Van Clines M.D.   On: 06/17/2017 09:33      Scheduled Meds: . allopurinol  100 mg Oral Daily  . amLODipine  10 mg Oral QHS  . feeding supplement (GLUCERNA SHAKE)  237 mL Oral BID BM  . insulin aspart  0-5 Units Subcutaneous QHS  . insulin aspart  0-9 Units Subcutaneous TID WC  . metoprolol succinate  100 mg Oral q morning - 10a   Continuous Infusions: . sodium chloride 0.9 % 1,000 mL infusion 75 mL/hr at 06/19/17 0135     LOS: 4 days    Time spent: 25 minutes  Greater than 50% of the time spent on counseling and coordinating the care.   Leisa Lenz, MD [facility] Pager [phone]  If 7PM-7AM, please contact night-coverage [email or web address]  Password [email or web address] 06/19/2017, 9:55 AM

## 2017-06-20 LAB — CBC
HEMATOCRIT: 21.4 % — AB (ref 39.0–52.0)
HEMOGLOBIN: 7.5 g/dL — AB (ref 13.0–17.0)
MCH: 31.8 pg (ref 26.0–34.0)
MCHC: 35 g/dL (ref 30.0–36.0)
MCV: 90.7 fL (ref 78.0–100.0)
Platelets: 130 10*3/uL — ABNORMAL LOW (ref 150–400)
RBC: 2.36 MIL/uL — AB (ref 4.22–5.81)
RDW: 14 % (ref 11.5–15.5)
WBC: 6.4 10*3/uL (ref 4.0–10.5)

## 2017-06-20 LAB — COMPREHENSIVE METABOLIC PANEL
ALBUMIN: 2.7 g/dL — AB (ref 3.5–5.0)
ALK PHOS: 208 U/L — AB (ref 38–126)
ALT: 74 U/L — AB (ref 17–63)
ANION GAP: 6 (ref 5–15)
AST: 70 U/L — ABNORMAL HIGH (ref 15–41)
BILIRUBIN TOTAL: 2.3 mg/dL — AB (ref 0.3–1.2)
BUN: 44 mg/dL — ABNORMAL HIGH (ref 6–20)
CALCIUM: 8.4 mg/dL — AB (ref 8.9–10.3)
CO2: 20 mmol/L — AB (ref 22–32)
CREATININE: 1.99 mg/dL — AB (ref 0.61–1.24)
Chloride: 107 mmol/L (ref 101–111)
GFR calc non Af Amer: 28 mL/min — ABNORMAL LOW (ref >60)
GFR, EST AFRICAN AMERICAN: 33 mL/min — AB (ref >60)
GLUCOSE: 101 mg/dL — AB (ref 65–99)
Potassium: 4.4 mmol/L (ref 3.5–5.1)
Sodium: 133 mmol/L — ABNORMAL LOW (ref 135–145)
TOTAL PROTEIN: 5.8 g/dL — AB (ref 6.5–8.1)

## 2017-06-20 LAB — GLUCOSE, CAPILLARY
Glucose-Capillary: 111 mg/dL — ABNORMAL HIGH (ref 65–99)
Glucose-Capillary: 104 mg/dL — ABNORMAL HIGH (ref 65–99)
Glucose-Capillary: 118 mg/dL — ABNORMAL HIGH (ref 65–99)

## 2017-06-20 LAB — PREPARE RBC (CROSSMATCH)

## 2017-06-20 LAB — CANCER ANTIGEN 19-9: CA 19-9: 125 U/mL — ABNORMAL HIGH (ref 0–35)

## 2017-06-20 LAB — ABO/RH: ABO/RH(D): A NEG

## 2017-06-20 MED ORDER — SODIUM CHLORIDE 0.9 % IV SOLN
Freq: Once | INTRAVENOUS | Status: AC
  Administered 2017-06-20: 13:00:00 via INTRAVENOUS

## 2017-06-20 MED ORDER — INSULIN ASPART 100 UNIT/ML ~~LOC~~ SOLN: 0.0000 [IU] | Freq: Three times a day (TID) | SUBCUTANEOUS | 11 refills | Status: DC

## 2017-06-20 MED ORDER — GLUCERNA SHAKE PO LIQD: 237.0000 mL | Freq: Two times a day (BID) | ORAL | 0 refills | Status: DC

## 2017-06-20 NOTE — Progress Notes (Addendum)
[facility] Progress Note  Chief Complaint: obstructive jaundice  Subjective  History:  No clinical change overnight. Transfer to [facility] in progress - -see my notes from yesterday for details. He denies abd pain, and is hungry  ROS: Cardiovascular:  no chest pain Respiratory: no dyspnea  Objective:  Med list reviewed  Vital signs in last 24 hrs: Vitals:   06/19/17 2133 06/20/17 0514  BP: (!) 146/52 (!) 124/47  Pulse: 65 67  Resp: 18 17  Temp: 98.1 F (36.7 C) 98 F (36.7 C)    Physical Exam   HEENT: sclera mildly icteric, oral mucosa moist without lesions  Neck: supple, no thyromegaly, JVD or lymphadenopathy  Cardiac: RRR without murmurs, S1S2 heard, no peripheral edema  Pulm: clear to auscultation bilaterally, normal RR and effort noted  Abdomen: soft, no tenderness, with active bowel sounds. No guarding or palpable hepatosplenomegaly  Skin; warm and dry, mild jaundice   Recent Labs:   Recent Labs Lab 06/18/17 0530 06/19/17 0835 06/20/17 0337  WBC 7.1 10.7* 6.4  HGB 8.0* 9.0* 7.5*  HCT 22.9* 26.8* 21.4*  PLT 117* 228 130*    Recent Labs Lab 06/20/17 0337  NA 133*  K 4.4  CL 107  CO2 20*  BUN 44*  ALBUMIN 2.7*  ALKPHOS 208*  ALT 74*  AST 70*  GLUCOSE 101*   CMP Latest Ref Rng & Units 06/20/2017 06/19/2017 06/18/2017  Glucose 65 - 99 mg/dL 101(H) 157(H) 126(H)  BUN 6 - 20 mg/dL 44(H) 44(H) 42(H)  Creatinine 0.61 - 1.24 mg/dL 1.99(H) 2.07(H) 2.33(H)  Sodium 135 - 145 mmol/L 133(L) 128(L) 123(L)  Potassium 3.5 - 5.1 mmol/L 4.4 4.4 4.5  Chloride 101 - 111 mmol/L 107 99(L) 95(L)  CO2 22 - 32 mmol/L 20(L) 21(L) 20(L)  Calcium 8.9 - 10.3 mg/dL 8.4(L) 8.7(L) 8.5(L)  Total Protein 6.5 - 8.1 g/dL 5.8(L) - 6.2(L)  Total Bilirubin 0.3 - 1.2 mg/dL 2.3(H) - 3.0(H)  Alkaline Phos 38 - 126 U/L 208(H) - 160(H)  AST 15 - 41 U/L 70(H) - 58(H)  ALT 17 - 63 U/L 74(H) - 61     Recent Labs Lab 06/19/17 1038  INR 1.06     @ASSESSMENTPLANBEGIN @ Assessment:  Obstructive jaundice. No sign of cholangitis. 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 Anemia of chronic renal disease.  Plan: Transfer to [facility] main hospital for consult with hepatobiliary GI service and ERCP later this week.I communicated this to [facility] doc this AM.  Hopeful cholangioscopy with tissue specimen and biliary stent(s). Transfuse a unit of blood (ordered), and he needs to get back on procrit soon.  Total time 25 minutes.  Nelida Meuse III Pager [phone] Mon-Fri 8a-5p [phone] after 5p, weekends, holidays

## 2017-06-20 NOTE — Discharge Instructions (Signed)
Endoscopic Retrograde Cholangiopancreatogram Endoscopic retrograde cholangiopancreatogram (ERCP) is a procedure that may be used to diagnose or treat problems with the pancreas, bile ducts, liver, and gallbladder. For this procedure, a thin, lighted tube (endoscope) is passed through the mouth, the throat, and down into the areas being checked. The endoscope has a camera that allows the areas to be viewed. Dye is injected and then X-rays are taken to further study the areas. During ERCP, other procedures may also be done to help diagnose or treat problems that are found. For example, stones can be removed, or a tissue sample can be taken out for testing (biopsy). Tell a health care provider about:  Any allergies you have.  All medicines you are taking, including vitamins, herbs, eye drops, creams, and over-the-counter medicines.  Any problems you or family members have had with anesthetic medicines.  Any blood disorders you have.  Any surgeries you have had.  Any medical conditions you have.  Whether you are pregnant or may be pregnant. What are the risks? Generally, this is a safe procedure. However, problems may occur, including:  Pancreatitis.  Infection.  Bleeding.  Allergic reactions to medicines or dyes.  Accidental punctures in the bowel wall, pancreas, or gallbladder.  Damage to other structures or organs.  What happens before the procedure? Staying hydrated Follow instructions from your health care provider about hydration, which may include:  Up to 2 hours before the procedure - you may continue to drink clear liquids, such as water, clear fruit juice, black coffee, and plain tea.  Eating and drinking restrictions Follow instructions from your health care provider about eating and drinking, which may include:  8 hours before the procedure - stop eating heavy meals or foods such as meat, fried foods, or fatty foods.  6 hours before the procedure - stop eating  light meals or foods, such as toast or cereal.  6 hours before the procedure - stop drinking milk or drinks that contain milk.  2 hours before the procedure - stop drinking clear liquids.  General instructions  Ask your health care provider about: ? Changing or stopping your regular medicines. This is especially important if you are taking diabetes medicines or blood thinners. ? Taking medicines such as aspirin and ibuprofen. These medicines can thin your blood. Do not take these medicines before your procedure if your health care provider instructs you not to.  Plan to have someone take you home from the hospital or clinic.  If you will be going home right after the procedure, plan to have someone with you for 24 hours. What happens during the procedure?  To lower your risk of infection, your health care team will wash or sanitize their hands.  An IV tube will be inserted into one of your veins.  You will be given one or more of the following: ? A medicine to help you relax (sedative). ? A medicine to numb the throat area (local anesthetic) and prevent gagging. Your throat may be sprayed with this medicine, or you may gargle the medicine. ? A medicine to make you fall asleep (general anesthetic). ? A medicine to lower your risk of infection (antibiotic), inflammation (anti-inflammatory), or both.  You will lie on your left side.  The endoscope will be inserted through your mouth, down the back of the throat, and into the first part of the small intestine (duodenum).  Then a small, plastic tube (cannula) will be passed through the endoscope and directed into the bile duct  or pancreatic duct.  Dye will be injected through the cannula to make structures easier to see on an X-ray.  X-rays will be taken to study the biliary and pancreatic passageways. You may be positioned on your abdomen or your back during the X-rays.  A small sample of tissue (biopsy) may be removed for  examination, or other procedures may be done to fix problems that are found. The procedure may vary among health care providers and hospitals. What happens after the procedure?  Your blood pressure, heart rate, breathing rate, and blood oxygen level will be monitored until the medicines you were given have worn off.  Your throat may feel slightly sore.  You will not be allowed to eat or drink until numbness subsides.  Do not drive for 24 hours if you were given a sedative. Summary  Endoscopic retrograde cholangiopancreatogram is a procedure that may be used to diagnose or treat problems with the pancreas, bile ducts, liver, and gallbladder.  During ERCP, other procedures may also be done to help diagnose or treat problems that are found. For example, stones can be removed, or a tissue sample can be taken out for testing (biopsy).  Generally, this is a safe procedure. However, problems may occur, including infection, bleeding, pancreatitis, accidental damage to other structures or organs, and allergic reactions to medicines or dyes.  The procedure may vary among health care providers and hospitals. This information is not intended to replace advice given to you by your health care provider. Make sure you discuss any questions you have with your health care provider. Document Released: 08/09/2001 Document Revised: 10/10/2016 Document Reviewed: 10/10/2016 Elsevier Interactive Patient Education  2017 [employer].

## 2017-06-20 NOTE — Progress Notes (Signed)
Admit: 06/15/2017 LOS: 41  83 yo M with weight loss, failure to thrive, and anemia (FOBT+) admitted with hyponatremia, Na 119. 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 Planning for transfer to [facility] for ERCP.   Subjective:  Sodium improved today with IVFs. Patient is asymptomatic without complaints.   07/23 0701 - 07/24 0700 In: 848.8 [I.V.:848.8] Out: 1675 [Urine:1675]  Filed Weights   06/15/17 2120  Weight: 141 lb 6.4 oz (64.1 kg)    Scheduled Meds: . allopurinol  100 mg Oral Daily  . amLODipine  10 mg Oral QHS  . feeding supplement (GLUCERNA SHAKE)  237 mL Oral BID BM  . insulin aspart  0-5 Units Subcutaneous QHS  . insulin aspart  0-9 Units Subcutaneous TID WC  . metoprolol succinate  100 mg Oral q morning - 10a   Continuous Infusions: . sodium chloride 0.9 % 1,000 mL infusion 75 mL/hr at 06/20/17 0307   PRN Meds:.acetaminophen **OR** acetaminophen, alum & mag hydroxide-simeth, ondansetron **OR** ondansetron (ZOFRAN) IV  Current Labs: reviewed    Physical Exam:  Blood pressure (!) 124/47, pulse 67, temperature 98 F (36.7 C), temperature source Oral, resp. rate 17, height 5\' 7"  (1.702 m), weight 141 lb 6.4 oz (64.1 kg), SpO2 99 %. GEN: Awake and alert, NAD ENT: Mild icteric sclera  CV: RRR PULM: CTAB ABD: Soft, non tender, normal BS SKIN: No rash EXT: No edema   A 1. Hyponatremia: Hypotonic, euvolemic or hypovolemia, UNa 43. Likely secondary to poor PO intake and failure to thrive. Resolving with IVFs and holding home diuretic.  2. CKD IV: Stable, near baseline  3. Biliary Obstruction: MRI abdomen with intrahepatic biliary mass, moderately progressed since 2014 MRCP. Planning for transfer to [place] hepatobiliary service.  4. Normocytic Anemia: Chronic, with heme positive stool. Planning for colonoscopy and EGD per GI.   P 1. Continue NS for now, encourage PO intake. Patient continues to endorse poor appetite. Hold home  losartan-HCTZ. Nephro will sign off - ok for transfer from our standpoint.  2. Daily labs, avoid nephrotoxins 3. Planning to transfer to [facility] for further work up  4. Other issues per primary   Velna Ochs, M.D. - PGY2 Pager: [phone] 06/20/2017, 8:11 AM    Recent Labs Lab 06/13/17 1022 06/15/17 1444  06/18/17 0530 06/19/17 0835 06/20/17 0337  NA 122* 119*  < > 123* 128* 133*  K 4.7 4.5  < > 4.5 4.4 4.4  CL 91* 92*  < > 95* 99* 107  CO2 22 21  < > 20* 21* 20*  GLUCOSE 184* 140*  < > 126* 157* 101*  BUN 27* 29*  < > 42* 44* 44*  CREATININE 1.89* 1.80*  < > 2.33* 2.07* 1.99*  CALCIUM 9.0 8.6  < > 8.5* 8.7* 8.4*  PHOS 3.3 3.9  --   --   --   --   < > = values in this interval not displayed.  Recent Labs Lab 06/18/17 0530 06/19/17 0835 06/20/17 0337  WBC 7.1 10.7* 6.4  HGB 8.0* 9.0* 7.5*  HCT 22.9* 26.8* 21.4*  MCV 90.5 91.5 90.7  PLT 117* 228 130*

## 2017-06-20 NOTE — Discharge Summary (Addendum)
Physician Discharge Summary  Gerald Jacobs VZD:[id number] DOB: Apr 18, 1925 DOA: 06/15/2017  PCP: Golden Circle, FNP  Admit date: 06/15/2017 Discharge date: 06/20/2017  Recommendations for Outpatient Follow-up:  1. Plan for ERCP in [facility]  Discharge Diagnoses:  Principal Problem:   Hyponatremia Active Problems:   Type 2 diabetes mellitus with stage 4 chronic kidney disease, without long-term current use of insulin (HCC)   CKD (chronic kidney disease), stage IV (HCC)   Normocytic anemia   Essential hypertension   Weight loss   Hyperbilirubinemia   Liver mass    Discharge Condition: stable   Diet recommendation: as tolerated   History of present illness:  83 year old male with NIDDM, CKD stage 4 with baseline Cr 2.2, hypertensin who presented to ED with abnormal sodium. . Pt was hemodynamically stable. Sodium was 122, bilirubin was 3.1 and ALP 160.  Pt was seen by GI, plan for ERCP at [facility].  Hospital Course:   Principal Problem:   Hyponatremia, euvolemic hypo-osmolar  - New hyponatremia, unclear etiology, ?from CKD or dehydration  - Sodium has improved  - Seen by renal in consultation   Active Problems:   Type 2 diabetes mellitus with stage 4 chronic kidney disease, without long-term current use of insulin (HCC) - Continue insulin per home regimen     CKD (chronic kidney disease), stage IV (HCC) - Cr and sodium pending     Anemia of chronic disease - Transfuse 1 U PRBC this am prior to transfer     Essential hypertension - Continue Norvasc and metoprolol     Hyperbilirubinemia / Obstructive jaundice  - Has had cholecystectomy in past - Plan for ERCP at [facility]    Thrombocytopenia - D/C Lovenox subQ - Platelets stable     Moderate protein calorie malnutrition - In the context of chronic illness - Seen by dietician   DVT prophylaxis: SCD's Code Status: full code  Family Communication: no family at the bedside this am; called pt daughter; her husband  is an IM MD At [facility] so hoep they can help with transfer   Consultants:   GI  IR  Procedures:   None  Antimicrobials:   None    Signed:  Leisa Lenz, MD  [facility] 06/20/2017, 9:54 AM  Pager #: [phone]  Time spent in minutes: more than 30 minutes   Discharge Exam: Vitals:   06/19/17 2133 06/20/17 0514  BP: (!) 146/52 (!) 124/47  Pulse: 65 67  Resp: 18 17  Temp: 98.1 F (36.7 C) 98 F (36.7 C)   Vitals:   06/19/17 0541 06/19/17 1524 06/19/17 2133 06/20/17 0514  BP: (!) 126/54 (!) 134/47 (!) 146/52 (!) 124/47  Pulse: 64 (!) 57 65 67  Resp: 17 17 18 17   Temp: 98.1 F (36.7 C) 97.6 F (36.4 C) 98.1 F (36.7 C) 98 F (36.7 C)  TempSrc:  Oral Oral Oral  SpO2: 100%  100% 99%  Weight:      Height:        General: Pt is alert, follows commands appropriately, not in acute distress; skin jaundiced  Cardiovascular: Regular rate and rhythm, S1/S2 +, no murmurs Respiratory: Clear to auscultation bilaterally, no wheezing, no crackles, no rhonchi Abdominal: Soft, non tender, non distended, bowel sounds +, no guarding Extremities: no edema, no cyanosis, pulses palpable bilaterally DP and PT Neuro: Grossly nonfocal  Discharge Instructions  Discharge Instructions    Call MD for:  persistant nausea and vomiting    Complete by:  As directed    Call MD for:  redness, tenderness, or signs of infection (pain, swelling, redness, odor or green/yellow discharge around incision site)    Complete by:  As directed    Call MD for:  severe uncontrolled pain    Complete by:  As directed    Diet - low sodium heart healthy    Complete by:  As directed    Increase activity slowly    Complete by:  As directed      Allergies as of 06/20/2017      Reactions   Colchicine Other (See Comments)   GI problems   Flexeril [cyclobenzaprine Hcl] Other (See Comments)   GI problems   Levaquin [levofloxacin In D5w] Other (See Comments)   Insomnia   Percocet  [oxycodone-acetaminophen] Nausea And Vomiting   Zebeta Other (See Comments)   Gi problems      Medication List    STOP taking these medications   FISH OIL PO   losartan-hydrochlorothiazide 100-12.5 MG tablet Commonly known as:  HYZAAR     TAKE these medications   allopurinol 100 MG tablet Commonly known as:  ZYLOPRIM TAKE 1 TABLET (100 MG TOTAL) BY MOUTH DAILY.   amLODipine 2.5 MG tablet Commonly known as:  NORVASC Take 1 tablet (2.5 mg total) by mouth daily. What changed:  when to take this   aspirin 81 MG tablet Take 81 mg by mouth daily.   Biotin 1000 MCG tablet Take 1,000 mcg by mouth daily.   calcium carbonate 500 MG chewable tablet Commonly known as:  TUMS - dosed in mg elemental calcium Chew 1 tablet by mouth 2 (two) times daily as needed for indigestion or heartburn.   diphenhydramine-acetaminophen 25-500 MG Tabs tablet Commonly known as:  TYLENOL PM Take 1 tablet by mouth at bedtime as needed (sleep).   feeding supplement (GLUCERNA SHAKE) Liqd Take 237 mLs by mouth 2 (two) times daily between meals.   glucose blood test strip Use one strip per test. Check blood sugars 1-4 times per day as instructed.   insulin aspart 100 UNIT/ML injection Commonly known as:  novoLOG Inject 0-9 Units into the skin 3 (three) times daily with meals.   L-Lysine 500 MG Caps Take 500 mg by mouth 2 (two) times daily.   metoprolol succinate 50 MG 24 hr tablet Commonly known as:  TOPROL-XL Take 50 mg by mouth every morning.   ONETOUCH DELICA LANCETS 30Z Misc TEST BLOOD SUGARS 1-4 TIMES A DAY AS INSTRUCTED. DX CODE: E11.9   saxagliptin HCl 2.5 MG Tabs tablet Commonly known as:  ONGLYZA Take 2.5 mg by mouth daily.      Follow-up Information    Golden Circle, FNP. Schedule an appointment as soon as possible for a visit today.   Specialty:  Family Medicine Contact information: [address] [place] [address] [phone]            The results of  significant diagnostics from this hospitalization (including imaging, microbiology, ancillary and laboratory) are listed below for reference.    Significant Diagnostic Studies: Ct Abdomen Pelvis Wo Contrast  Result Date: 06/15/2017 CLINICAL DATA:  Generalized weakness, loss of appetite and nausea. Chest pain for several months. History of hypertension, diabetes and renal insufficiency. EXAM: CT CHEST, ABDOMEN AND PELVIS WITHOUT CONTRAST TECHNIQUE: Multidetector CT imaging of the chest, abdomen and pelvis was performed following the standard protocol without IV contrast. COMPARISON:  CXR 01/18/2016, abdomen ultrasound 05/28/2013, chest CT 08/14/2008, MRI 07/23/2013 FINDINGS: CT  CHEST FINDINGS Cardiovascular: The cardiac chambers are normal in size. No pericardial effusion. There is coronary arteriosclerosis along the LAD, circumflex and RCA. There is aortic atherosclerosis without aneurysm. The unenhanced pulmonary artery is unremarkable without main pulmonary arterial dilatation. Mediastinum/Nodes: No enlarged mediastinal, hilar, or axillary lymph nodes. Thyroid gland, trachea, and esophagus demonstrate no significant findings. A tiny 2 mm cystic nodule of the left thyroid gland is incidentally noted and stable dating back to 2009 consistent with a benign finding. Lungs/Pleura: Chronic stable apical pleuroparenchymal scarring, right greater than left with calcified and noncalcified pleural plaque or thickening along the posterior aspect of the right hemithorax. Fine interstitial changes along the periphery of the left lower lobe may represent areas of minimal fibrosis with tiny subpleural blebs at each lung base. Similar findings in the medial right middle lobe. No effusion or pneumothorax. No dominant mass. Musculoskeletal: No chest wall mass or suspicious bone lesions identified. Degenerative changes are seen along the dorsal spine. CT ABDOMEN PELVIS FINDINGS Hepatobiliary: Status post cholecystectomy.  Chronic atrophy of the left hepatic lobe is again noted with chronic moderately dilated intrahepatic ducts within the atrophic left hepatic lobe. Faint calcifications are seen within the peripheral dilated ducts suspicious for small intraductal calcifications. The degree of intrahepatic dilatation is somewhat more pronounced than on prior without definite etiology identified. The possibility of intrahepatic strictures are not entirely excluded. A granuloma is noted in the right hepatic lobe. Surgical clips are seen along the margin of the left hepatic lobe. Pancreas: Unremarkable. No pancreatic ductal dilatation or surrounding inflammatory changes. Spleen: Normal in size without focal abnormality. Adrenals/Urinary Tract: Right renal collecting system. No obstructive uropathy. 15 mm cyst in the upper pole of the left kidney. Further characterization not possible given the noncontrast study. Urinary bladder is physiologically distended. Stomach/Bowel: Stomach is within normal limits. Appendix appears normal. No evidence of bowel wall thickening, distention, or inflammatory changes. Vascular/Lymphatic: Aortic atherosclerosis. No enlarged abdominal or pelvic lymph nodes. The splenic and portal vein can't be adequately assessed due to the noncontrast study. Reproductive: Prostatectomy with pelvic clips in place. Other: No abdominal wall hernia or abnormality. No abdominopelvic ascites. Musculoskeletal: No acute nor suspicious osseous lesions. Thoracolumbar spondylosis. No evidence of blastic disease. IMPRESSION: 1. Three-vessel coronary arteriosclerosis. 2. Aortoiliac atherosclerosis without aneurysm. 3. Atrophy of the left hepatic lobe with chronic moderate intrahepatic ductal dilatation slightly increased in prominence since prior exam without etiology. Small strictures are not entirely excluded. Consider repeat MRI of the abdomen. There are a few punctate peripheral calcifications which may represent tiny stones  within the or adjacent to the biliary system. Right hepatic granuloma is also noted. 4. Calcified and noncalcified pleural plaque along the posterior aspect of the right thorax. Fine interstitial prominence may reflect minimal fibrosis in the right middle lobe and left lower lobe with several small bibasilar pulmonary blebs noted. 5. Cholecystectomy. 6. Prostatectomy without evidence of osteoblastic disease. 7. 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 Probable 15 mm cyst left upper pole of the kidney. Electronically Signed   By: Ashley Royalty M.D.   On: 06/15/2017 22:57   Ct Chest Wo Contrast  Result Date: 06/15/2017 CLINICAL DATA:  Generalized weakness, loss of appetite and nausea. Chest pain for several months. History of hypertension, diabetes and renal insufficiency. EXAM: CT CHEST, ABDOMEN AND PELVIS WITHOUT CONTRAST TECHNIQUE: Multidetector CT imaging of the chest, abdomen and pelvis was performed following the standard protocol without IV contrast. COMPARISON:  CXR 01/18/2016, abdomen ultrasound 05/28/2013, chest  CT 08/14/2008, MRI 07/23/2013 FINDINGS: CT CHEST FINDINGS Cardiovascular: The cardiac chambers are normal in size. No pericardial effusion. There is coronary arteriosclerosis along the LAD, circumflex and RCA. There is aortic atherosclerosis without aneurysm. The unenhanced pulmonary artery is unremarkable without main pulmonary arterial dilatation. Mediastinum/Nodes: No enlarged mediastinal, hilar, or axillary lymph nodes. Thyroid gland, trachea, and esophagus demonstrate no significant findings. A tiny 2 mm cystic nodule of the left thyroid gland is incidentally noted and stable dating back to 2009 consistent with a benign finding. Lungs/Pleura: Chronic stable apical pleuroparenchymal scarring, right greater than left with calcified and noncalcified pleural plaque or thickening along the posterior aspect of the right hemithorax. Fine interstitial changes along the periphery  of the left lower lobe may represent areas of minimal fibrosis with tiny subpleural blebs at each lung base. Similar findings in the medial right middle lobe. No effusion or pneumothorax. No dominant mass. Musculoskeletal: No chest wall mass or suspicious bone lesions identified. Degenerative changes are seen along the dorsal spine. CT ABDOMEN PELVIS FINDINGS Hepatobiliary: Status post cholecystectomy. Chronic atrophy of the left hepatic lobe is again noted with chronic moderately dilated intrahepatic ducts within the atrophic left hepatic lobe. Faint calcifications are seen within the peripheral dilated ducts suspicious for small intraductal calcifications. The degree of intrahepatic dilatation is somewhat more pronounced than on prior without definite etiology identified. The possibility of intrahepatic strictures are not entirely excluded. A granuloma is noted in the right hepatic lobe. Surgical clips are seen along the margin of the left hepatic lobe. Pancreas: Unremarkable. No pancreatic ductal dilatation or surrounding inflammatory changes. Spleen: Normal in size without focal abnormality. Adrenals/Urinary Tract: Right renal collecting system. No obstructive uropathy. 15 mm cyst in the upper pole of the left kidney. Further characterization not possible given the noncontrast study. Urinary bladder is physiologically distended. Stomach/Bowel: Stomach is within normal limits. Appendix appears normal. No evidence of bowel wall thickening, distention, or inflammatory changes. Vascular/Lymphatic: Aortic atherosclerosis. No enlarged abdominal or pelvic lymph nodes. The splenic and portal vein can't be adequately assessed due to the noncontrast study. Reproductive: Prostatectomy with pelvic clips in place. Other: No abdominal wall hernia or abnormality. No abdominopelvic ascites. Musculoskeletal: No acute nor suspicious osseous lesions. Thoracolumbar spondylosis. No evidence of blastic disease. IMPRESSION: 1.  Three-vessel coronary arteriosclerosis. 2. Aortoiliac atherosclerosis without aneurysm. 3. Atrophy of the left hepatic lobe with chronic moderate intrahepatic ductal dilatation slightly increased in prominence since prior exam without etiology. Small strictures are not entirely excluded. Consider repeat MRI of the abdomen. There are a few punctate peripheral calcifications which may represent tiny stones within the or adjacent to the biliary system. Right hepatic granuloma is also noted. 4. Calcified and noncalcified pleural plaque along the posterior aspect of the right thorax. Fine interstitial prominence may reflect minimal fibrosis in the right middle lobe and left lower lobe with several small bibasilar pulmonary blebs noted. 5. Cholecystectomy. 6. Prostatectomy without evidence of osteoblastic disease. 7. 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 Probable 15 mm cyst left upper pole of the kidney. Electronically Signed   By: Ashley Royalty M.D.   On: 06/15/2017 22:57   Mr Abdomen Mrcp Wo Contrast  Result Date: 06/17/2017 CLINICAL DATA:  Weight loss, fatigue, nausea, and new primarily direct hyperbilirubinemia. Prior sphincterotomy and prior laparoscopic cholecystectomy. Filling defect or stricture in the central left intrahepatic duct on the intraoperative cholangiogram in 2015. EXAM: MRI ABDOMEN WITHOUT CONTRAST  (INCLUDING MRCP) TECHNIQUE: Multiplanar multisequence MR imaging of the abdomen  was performed. Heavily T2-weighted images of the biliary and pancreatic ducts were obtained, and three-dimensional MRCP images were rendered by post processing. COMPARISON:  Multiple exams, including CT scan 06/15/2017 and intraoperative cholangiogram from 02/04/2014 FINDINGS: Lower chest: Unremarkable Hepatobiliary: Prominent left and moderate to prominent right intrahepatic biliary dilatation due to a 3.9 by 2.4 by 3.1 cm primarily intraluminal mass involving the left hepatic duct, common hepatic  duct, and right hepatic duct shown on images 26-27 of series 5 and also image 19 series 3. There is associated chronic atrophy in the left hepatic lobe. The intraluminal filling defect is larger than on 07/23/2013, and is currently obstructing the right side in addition to the left. The mass extends nearly to the level of the common bile duct. The CBD distal to the mass is of normal caliber. I do not see a separate mass in the liver. Pancreas:  Unremarkable Spleen:  Unremarkable Adrenals/Urinary Tract: Hydronephrosis involving the upper pole the right kidney with a large adjacent extrarenal pelvis, but no involvement of the lower pole, query duplicated collecting system. This is a similar appearance to the prior exam. A 1.6 by 1.6 cm cystic lesion of the left kidney upper pole has diffuse high T1 signal along with non dependent high T2 and dependent low T2 signal, favoring a complex cyst. Stomach/Bowel: Unremarkable Vascular/Lymphatic: Aortoiliac atherosclerotic vascular disease. Small porta hepatis lymph nodes do not appear pathologically enlarged. Other:  No supplemental non-categorized findings. Musculoskeletal: Dextroconvex lumbar scoliosis with associated spondylosis and degenerative disc disease IMPRESSION: 1. Intraluminal mass extending in the common hepatic duct, left hepatic duct, and right hepatic duct in causing biliary obstruction, with considerable biliary dilatation. This represents a moderate progression compared to the 2014 MRI. This is likely chronic on the left side and is causing some atrophy of the left side of the liver. Clearly cholangiocarcinoma would be the main concern, but it would be unusual for this to be present since 2014 without significantly greater progression than what is present in this case. This raises the possibility of other types of biliary filling defects, including intraductal papillary tumor, blood products, sarcoidosis, and liver fluke parasitic infection. Tissue diagnosis  is likely warranted. 2. Chronic hydronephrosis involving the upper pole the right kidney, but not involving the lower pole, query duplicated collecting system. 3. Complex cystic lesion of the left kidney upper pole with high precontrast T1 signal. A Bosniak classification cannot be assigned due to the lack of contrast. Electronically Signed   By: Van Clines M.D.   On: 06/17/2017 09:33   Mr 3d Recon At Scanner  Result Date: 06/17/2017 CLINICAL DATA:  Weight loss, fatigue, nausea, and new primarily direct hyperbilirubinemia. Prior sphincterotomy and prior laparoscopic cholecystectomy. Filling defect or stricture in the central left intrahepatic duct on the intraoperative cholangiogram in 2015. EXAM: MRI ABDOMEN WITHOUT CONTRAST  (INCLUDING MRCP) TECHNIQUE: Multiplanar multisequence MR imaging of the abdomen was performed. Heavily T2-weighted images of the biliary and pancreatic ducts were obtained, and three-dimensional MRCP images were rendered by post processing. COMPARISON:  Multiple exams, including CT scan 06/15/2017 and intraoperative cholangiogram from 02/04/2014 FINDINGS: Lower chest: Unremarkable Hepatobiliary: Prominent left and moderate to prominent right intrahepatic biliary dilatation due to a 3.9 by 2.4 by 3.1 cm primarily intraluminal mass involving the left hepatic duct, common hepatic duct, and right hepatic duct shown on images 26-27 of series 5 and also image 19 series 3. There is associated chronic atrophy in the left hepatic lobe. The intraluminal filling defect is larger than  on 07/23/2013, and is currently obstructing the right side in addition to the left. The mass extends nearly to the level of the common bile duct. The CBD distal to the mass is of normal caliber. I do not see a separate mass in the liver. Pancreas:  Unremarkable Spleen:  Unremarkable Adrenals/Urinary Tract: Hydronephrosis involving the upper pole the right kidney with a large adjacent extrarenal pelvis, but no  involvement of the lower pole, query duplicated collecting system. This is a similar appearance to the prior exam. A 1.6 by 1.6 cm cystic lesion of the left kidney upper pole has diffuse high T1 signal along with non dependent high T2 and dependent low T2 signal, favoring a complex cyst. Stomach/Bowel: Unremarkable Vascular/Lymphatic: Aortoiliac atherosclerotic vascular disease. Small porta hepatis lymph nodes do not appear pathologically enlarged. Other:  No supplemental non-categorized findings. Musculoskeletal: Dextroconvex lumbar scoliosis with associated spondylosis and degenerative disc disease IMPRESSION: 1. Intraluminal mass extending in the common hepatic duct, left hepatic duct, and right hepatic duct in causing biliary obstruction, with considerable biliary dilatation. This represents a moderate progression compared to the 2014 MRI. This is likely chronic on the left side and is causing some atrophy of the left side of the liver. Clearly cholangiocarcinoma would be the main concern, but it would be unusual for this to be present since 2014 without significantly greater progression than what is present in this case. This raises the possibility of other types of biliary filling defects, including intraductal papillary tumor, blood products, sarcoidosis, and liver fluke parasitic infection. Tissue diagnosis is likely warranted. 2. Chronic hydronephrosis involving the upper pole the right kidney, but not involving the lower pole, query duplicated collecting system. 3. Complex cystic lesion of the left kidney upper pole with high precontrast T1 signal. A Bosniak classification cannot be assigned due to the lack of contrast. Electronically Signed   By: Van Clines M.D.   On: 06/17/2017 09:33    Microbiology: Recent Results (from the past 175 hour(s))  Helicobacter pylori special antigen     Status: None   Collection Time: 06/13/17  3:05 PM  Result Value Ref Range Status   H. PYLORI Antigen  NOT  DETECTED  Final   H. PYLORI Antigen  Antimicrobials,proton pump inhibitors and bismuth  Final   H. PYLORI Antigen    Final    preparations are known to suppress H.pylori and ingestion of   H. PYLORI Antigen    Final    these prior to testing may cause a false negative result. If   H. PYLORI Antigen    Final    a negative result is obtained for a patient that has    H. PYLORI Antigen  ingested these compounds within two weeks prior of  Final   H. PYLORI Antigen    Final    performing the H.pylori test, results may be falsely   H. PYLORI Antigen    Final    negative and should be repeated with a new specimen two   H. PYLORI Antigen  weeks after discontinuing treatment.  Final     Labs: Basic Metabolic Panel:  Recent Labs Lab 06/13/17 1022 06/15/17 1444  06/16/17 1400 06/17/17 0411 06/18/17 0530 06/19/17 0835 06/20/17 0337  NA 122* 119*  < > 122* 124* 123* 128* 133*  K 4.7 4.5  < > 4.6 4.5 4.5 4.4 4.4  CL 91* 92*  < > 92* 97* 95* 99* 107  CO2 22 21  < > 21* 20* 20*  21* 20*  GLUCOSE 184* 140*  < > 158* 93 126* 157* 101*  BUN 27* 29*  < > 27* 34* 42* 44* 44*  CREATININE 1.89* 1.80*  < > 1.79* 1.92* 2.33* 2.07* 1.99*  CALCIUM 9.0 8.6  < > 8.5* 8.7* 8.5* 8.7* 8.4*  PHOS 3.3 3.9  --   --   --   --   --   --   < > = values in this interval not displayed. Liver Function Tests:  Recent Labs Lab 06/15/17 1656 06/16/17 0426 06/17/17 0411 06/18/17 0530 06/20/17 0337  AST 50* 48* 48* 58* 70*  ALT 61 54 55 61 74*  ALKPHOS 160* 150* 148* 160* 208*  BILITOT 3.1* 3.2*  3.0* 3.0* 3.0* 2.3*  PROT 6.8 6.2* 6.2* 6.2* 5.8*  ALBUMIN 3.4* 2.9* 2.9* 2.9* 2.7*    Recent Labs Lab 06/13/17 1022  LIPASE 65.0*  AMYLASE 48   No results for input(s): AMMONIA in the last 168 hours. CBC:  Recent Labs Lab 06/16/17 0426 06/17/17 0411 06/18/17 0530 06/19/17 0835 06/20/17 0337  WBC 6.9 7.1 7.1 10.7* 6.4  HGB 8.2* 8.0* 8.0* 9.0* 7.5*  HCT 24.3* 23.4* 22.9* 26.8* 21.4*  MCV 90.7 90.7  90.5 91.5 90.7  PLT 121* 115* 117* 228 130*   Cardiac Enzymes: No results for input(s): CKTOTAL, CKMB, CKMBINDEX, TROPONINI in the last 168 hours. BNP: BNP (last 3 results) No results for input(s): BNP in the last 8760 hours.  ProBNP (last 3 results) No results for input(s): PROBNP in the last 8760 hours.  CBG:  Recent Labs Lab 06/19/17 0806 06/19/17 1236 06/19/17 1639 06/19/17 2137 06/20/17 0737  GLUCAP 132* 112* 154* 125* 111*

## 2017-06-20 NOTE — Progress Notes (Addendum)
Spoke with [facility] transfer [facility], work in progress.  5:30 pm - transfer approved per Dr. Waldron Session.  Gerald Jacobs

## 2017-06-21 LAB — TYPE AND SCREEN
ABO/RH(D): A NEG
ANTIBODY SCREEN: NEGATIVE
Unit division: 0

## 2017-06-21 LAB — BPAM RBC
BLOOD PRODUCT EXPIRATION DATE: 201808012359
ISSUE DATE / TIME: 201807241216
Unit Type and Rh: 600

## 2017-06-24 ENCOUNTER — Other Ambulatory Visit: Payer: Self-pay | Admitting: Family

## 2017-06-24 DIAGNOSIS — E119 Type 2 diabetes mellitus without complications: Secondary | ICD-10-CM

## 2017-06-27 ENCOUNTER — Encounter: Payer: Self-pay | Admitting: Family

## 2017-06-27 ENCOUNTER — Ambulatory Visit (INDEPENDENT_AMBULATORY_CARE_PROVIDER_SITE_OTHER): Payer: Medicare Other | Admitting: Family

## 2017-06-27 ENCOUNTER — Other Ambulatory Visit (INDEPENDENT_AMBULATORY_CARE_PROVIDER_SITE_OTHER): Payer: Medicare Other

## 2017-06-27 VITALS — BP 172/80 | HR 64 | Temp 98.4°F | Resp 16 | Ht 67.0 in | Wt 138.0 lb

## 2017-06-27 DIAGNOSIS — K838 Other specified diseases of biliary tract: Secondary | ICD-10-CM

## 2017-06-27 DIAGNOSIS — I10 Essential (primary) hypertension: Secondary | ICD-10-CM | POA: Diagnosis not present

## 2017-06-27 DIAGNOSIS — E871 Hypo-osmolality and hyponatremia: Secondary | ICD-10-CM | POA: Diagnosis not present

## 2017-06-27 DIAGNOSIS — C221 Intrahepatic bile duct carcinoma: Secondary | ICD-10-CM | POA: Insufficient documentation

## 2017-06-27 HISTORY — DX: Other specified diseases of biliary tract: K83.8

## 2017-06-27 LAB — COMPREHENSIVE METABOLIC PANEL
ALT: 40 U/L (ref 0–53)
AST: 30 U/L (ref 0–37)
Albumin: 3.7 g/dL (ref 3.5–5.2)
Alkaline Phosphatase: 176 U/L — ABNORMAL HIGH (ref 39–117)
BILIRUBIN TOTAL: 1 mg/dL (ref 0.2–1.2)
BUN: 35 mg/dL — ABNORMAL HIGH (ref 6–23)
CO2: 24 meq/L (ref 19–32)
CREATININE: 2.02 mg/dL — AB (ref 0.40–1.50)
Calcium: 8.6 mg/dL (ref 8.4–10.5)
Chloride: 104 mEq/L (ref 96–112)
GFR: 33.17 mL/min — ABNORMAL LOW (ref >60.00)
GLUCOSE: 128 mg/dL — AB (ref 70–99)
Potassium: 4.8 mEq/L (ref 3.5–5.1)
Sodium: 135 mEq/L (ref 135–145)
Total Protein: 7.1 g/dL (ref 6.0–8.3)

## 2017-06-27 NOTE — Assessment & Plan Note (Signed)
Blood pressure elevated today above goal 140/90 with current medication regimen. Hydrochlorothiazide discontinued. Continue current dosage of losartan and amlodipine. Consider increasing medication if blood pressures remain elevated. Encourage monitor blood pressure at home. Denies worse headache of life with no symptoms of end organ damage noted in physical exam. Continue to monitor.

## 2017-06-27 NOTE — Assessment & Plan Note (Signed)
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 Symptoms have improved since leaving the hospital and reports that he is eating better and not having any current symptoms. Has follow up with GI Oncology to discuss treatment options. Continue to monitor and work on nutrition and weight gain. Obtain CMET. Follow up in 2 months or sooner if needed.

## 2017-06-27 NOTE — Progress Notes (Signed)
Subjective:    Patient ID: Gerald Jacobs, male    DOB: March 15, 1925, 83 y.o.   MRN: [id number]  Chief Complaint  Patient presents with  . Hospitalization Follow-up    states he needs some follow up blood work and questions about insulin, does not really think he needs it    HPI:  Gerald Jacobs is a 83 y.o. male who  has a past medical history of Cancer ([place]); Chronic anemia; Coronary artery disease; Diabetes mellitus; Essential hypertension; GERD (gastroesophageal reflux disease); History of gout; Mild renal insufficiency; Nocturia; and Osteoarthritis. and presents today   Recently evaluated in the ED following an office visit secondary to hyponatremia with nausea and weight loss. He was admitted to the hospital for hyponatremia of unclear etiology with questionable CKD or dehydration. He was transfused 1 unit of PRBC secondary to an anemia of chronic disease. Found to have obstructive jaundice and hyperbilirubinemia and was sent to [facility] for ERCP which was completed and found to have a bile duct mass. His HCTZ was stopped. There was ultimate concern for cholangiocarcinoma and is awaiting appointment with GI oncology. Recommended a CMP during office follow up. All hospital labs, imaging and records were reviewed in detail.   Since leaving the hospital he reports that he is feeling better and his eating has improved. No abdominal pains, nausea, vomiting, constipation or diarrhea. Blood sugars have been well controlled. Does have Glucerna shakes but not drinking them very much. Has returned to taking his medications as prescribed and denies adverse side effects.   Wt Readings from Last 3 Encounters:  06/27/17 138 lb (62.6 kg)  06/15/17 141 lb 6.4 oz (64.1 kg)  06/13/17 143 lb 12.8 oz (65.2 kg)     Allergies  Allergen Reactions  . Colchicine Other (See Comments)    GI problems  . Flexeril [Cyclobenzaprine Hcl] Other (See Comments)    GI problems   . Levaquin [Levofloxacin In D5w]  Other (See Comments)    Insomnia   . Percocet [Oxycodone-Acetaminophen] Nausea And Vomiting  . Zebeta Other (See Comments)    Gi problems      Outpatient Medications Prior to Visit  Medication Sig Dispense Refill  . allopurinol (ZYLOPRIM) 100 MG tablet TAKE 1 TABLET (100 MG TOTAL) BY MOUTH DAILY. 90 tablet 1  . aspirin 81 MG tablet Take 81 mg by mouth daily.      . Biotin 1000 MCG tablet Take 1,000 mcg by mouth daily.    . calcium carbonate (TUMS - DOSED IN MG ELEMENTAL CALCIUM) 500 MG chewable tablet Chew 1 tablet by mouth 2 (two) times daily as needed for indigestion or heartburn.    . diphenhydramine-acetaminophen (TYLENOL PM) 25-500 MG TABS Take 1 tablet by mouth at bedtime as needed (sleep).    . feeding supplement, GLUCERNA SHAKE, (GLUCERNA SHAKE) LIQD Take 237 mLs by mouth 2 (two) times daily between meals. 237 mL 0  . L-Lysine 500 MG CAPS Take 500 mg by mouth 2 (two) times daily.    . metoprolol succinate (TOPROL-XL) 50 MG 24 hr tablet Take 50 mg by mouth every morning.   3  . ONE TOUCH ULTRA TEST test strip USE ONE STRIP PER TEST. CHECK BLOOD SUGARS 1-4 TIMES PER DAY AS INSTRUCTED. 100 each 4  . ONETOUCH DELICA LANCETS 61W MISC TEST BLOOD SUGARS 1-4 TIMES A DAY AS INSTRUCTED. DX CODE: E11.9 100 each 2  . saxagliptin HCl (ONGLYZA) 2.5 MG TABS tablet Take 2.5 mg by mouth daily.    Marland Kitchen  insulin aspart (NOVOLOG) 100 UNIT/ML injection Inject 0-9 Units into the skin 3 (three) times daily with meals. 10 mL 11  . amLODipine (NORVASC) 2.5 MG tablet Take 1 tablet (2.5 mg total) by mouth daily. (Patient taking differently: Take 2.5 mg by mouth at bedtime. ) 30 tablet 11   No facility-administered medications prior to visit.       Past Surgical History:  Procedure Laterality Date  . CHOLECYSTECTOMY  02/04/2014   DR Zella Richer  . CHOLECYSTECTOMY N/A 02/04/2014   Procedure: LAPAROSCOPIC CHOLECYSTECTOMY with intraoperative cholangiogram;  Surgeon: Odis Hollingshead, MD;  Location: [place];   Service: General;  Laterality: N/A;  . ERCP N/A 10/15/2013   Procedure: ENDOSCOPIC RETROGRADE CHOLANGIOPANCREATOGRAPHY (ERCP);  Surgeon: Jeryl Columbia, MD;  Location: [facility];  Service: Endoscopy;  Laterality: N/A;  . KNEE SURGERY Right 1979  . melanoma surgery  1970's   on scalp  . PROSTATE SURGERY  1993   Prostate Cancer  . SPHINCTEROTOMY  10/15/2013   Procedure: SPHINCTEROTOMY;  Surgeon: Jeryl Columbia, MD;  Location: [facility];  Service: Endoscopy;;      Past Medical History:  Diagnosis Date  . Cancer [facility])    prostate  . Chronic anemia    With normal iron studies and normal serum protein electrophoresis  . Coronary artery disease    Mild  . Diabetes mellitus    Adult onset  . Essential hypertension   . GERD (gastroesophageal reflux disease)    occ tums  . History of gout   . Mild renal insufficiency   . Nocturia    x2  . Osteoarthritis       Review of Systems  Constitutional: Negative for appetite change, chills, diaphoresis, fatigue, fever and unexpected weight change.  Eyes:       Negative for changes in vision  Respiratory: Negative for cough, chest tightness and wheezing.   Cardiovascular: Negative for chest pain, palpitations and leg swelling.  Gastrointestinal: Negative for abdominal distention, abdominal pain, anal bleeding, blood in stool, constipation, diarrhea, nausea, rectal pain and vomiting.  Neurological: Negative for dizziness, weakness and light-headedness.      Objective:    BP (!) 172/80 (BP Location: Left Arm, Patient Position: Sitting, Cuff Size: Normal)   Pulse 64   Temp 98.4 F (36.9 C) (Oral)   Resp 16   Ht 5\' 7"  (1.702 m)   Wt 138 lb (62.6 kg)   SpO2 98%   BMI 21.61 kg/m  Nursing note and vital signs reviewed.  Physical Exam  Constitutional: He is oriented to person, place, and time. He appears cachectic.  Non-toxic appearance. He does not have a sickly appearance. He does not appear ill. No distress.  Cardiovascular:  Normal rate, regular rhythm, normal heart sounds and intact distal pulses.   Pulmonary/Chest: Effort normal and breath sounds normal. No respiratory distress. He has no wheezes. He has no rales. He exhibits no tenderness.  Abdominal: Soft. Bowel sounds are normal. He exhibits no distension and no mass. There is no tenderness. There is no rebound and no guarding.  Neurological: He is alert and oriented to person, place, and time.  Skin: Skin is warm and dry.  Psychiatric: He has a normal mood and affect. His behavior is normal. Judgment and thought content normal.       Assessment & Plan:   Problem List Items Addressed This Visit      Cardiovascular and Mediastinum   Essential hypertension    Blood pressure elevated today above  goal 140/90 with current medication regimen. Hydrochlorothiazide discontinued. Continue current dosage of losartan and amlodipine. Consider increasing medication if blood pressures remain elevated. Encourage monitor blood pressure at home. Denies worse headache of life with no symptoms of end organ damage noted in physical exam. Continue to monitor.      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 Symptoms have improved since leaving the hospital and reports that he is eating better and not having any current symptoms. Has follow up with GI Oncology to discuss treatment options. Continue to monitor and work on nutrition and weight gain. Obtain CMET. Follow up in 2 months or sooner if needed.       Relevant Orders   Comprehensive metabolic panel     Other   Hyponatremia    Obtain CMET for follow up of hyponaturemia. HCTZ has been discontinued which may be a contributing factor. Follow up pending CMET results.           I have discontinued Mr. Brotz's insulin aspart. I am also having him maintain his aspirin, diphenhydramine-acetaminophen, Biotin,  allopurinol, metoprolol succinate, amLODipine, ONETOUCH DELICA LANCETS 22Q, saxagliptin HCl, L-Lysine, calcium carbonate, feeding supplement (GLUCERNA SHAKE), ONE TOUCH ULTRA TEST, and losartan.   Meds ordered this encounter  Medications  . losartan (COZAAR) 50 MG tablet    Sig: Take 50 mg by mouth daily.     Follow-up: Return in about 2 months (around 08/27/2017), or if symptoms worsen or fail to improve.  Mauricio Po, FNP

## 2017-06-27 NOTE — Assessment & Plan Note (Signed)
Obtain CMET for follow up of hyponaturemia. HCTZ has been discontinued which may be a contributing factor. Follow up pending CMET results.

## 2017-06-27 NOTE — Patient Instructions (Signed)
Thank you for choosing [facility].  SUMMARY AND INSTRUCTIONS:  Continue to monitor your blood pressure at home.  Okay to STOP insulin.   We will check your blood work today for electrolytes and kidney function.   Follow up in 2 months or sooner.  Goal weight gain of 2-3 pounds in the next 2 months.   Medication:  Your prescription(s) have been submitted to your pharmacy or been printed and provided for you. Please take as directed and contact our office if you believe you are having problem(s) with the medication(s) or have any questions.  Follow up:  If your symptoms worsen or fail to improve, please contact our office for further instruction, or in case of emergency go directly to the emergency room at the closest medical facility.

## 2017-06-28 ENCOUNTER — Encounter: Payer: Self-pay | Admitting: Family

## 2017-06-29 ENCOUNTER — Telehealth: Payer: Self-pay | Admitting: Family

## 2017-06-29 DIAGNOSIS — D649 Anemia, unspecified: Secondary | ICD-10-CM

## 2017-06-29 NOTE — Telephone Encounter (Signed)
Sent to MyChart:  Your blood work shows that your sodium is stable. Your kidney function remains decreased, however your liver function looks good. Do you have a kidney doctor that you are following up with?

## 2017-06-29 NOTE — Telephone Encounter (Signed)
Pt called in looking for a call back with his results

## 2017-06-29 NOTE — Telephone Encounter (Signed)
Pt aware of results 

## 2017-06-29 NOTE — Telephone Encounter (Signed)
CBC put in for pt to have hemoglobin checked.

## 2017-06-30 ENCOUNTER — Other Ambulatory Visit (INDEPENDENT_AMBULATORY_CARE_PROVIDER_SITE_OTHER): Payer: Medicare Other

## 2017-06-30 DIAGNOSIS — D649 Anemia, unspecified: Secondary | ICD-10-CM

## 2017-06-30 LAB — CBC
HEMATOCRIT: 30.4 % — AB (ref 39.0–52.0)
Hemoglobin: 10 g/dL — ABNORMAL LOW (ref 13.0–17.0)
MCHC: 32.9 g/dL (ref 30.0–36.0)
MCV: 97.2 fl (ref 78.0–100.0)
Platelets: 201 10*3/uL (ref 150.0–400.0)
RBC: 3.13 Mil/uL — ABNORMAL LOW (ref 4.22–5.81)
RDW: 14.2 % (ref 11.5–15.5)
WBC: 8.6 10*3/uL (ref 4.0–10.5)

## 2017-07-13 ENCOUNTER — Telehealth: Payer: Self-pay | Admitting: Family

## 2017-07-13 DIAGNOSIS — E871 Hypo-osmolality and hyponatremia: Secondary | ICD-10-CM

## 2017-07-13 DIAGNOSIS — D649 Anemia, unspecified: Secondary | ICD-10-CM

## 2017-07-13 NOTE — Telephone Encounter (Signed)
Pt would like orders put in to have follow up hemoglobin and sodium and whatever else needs to be rechecked. Thank you

## 2017-07-13 NOTE — Telephone Encounter (Signed)
Pt called stating that he was told that he was going to need to have his blood work checked every two weeks. He was not sure if this was Marya Amsler that told him this or if it could have been another doctor. If it is through [place], do you know what he needs to have checked?

## 2017-07-13 NOTE — Addendum Note (Signed)
Addended by: Mauricio Po D on: 07/13/2017 12:36 PM   Modules accepted: Orders

## 2017-07-13 NOTE — Telephone Encounter (Signed)
I am unaware of the need to check blood work every 2 weeks but would be happy to place orders if needed.

## 2017-07-13 NOTE — Telephone Encounter (Signed)
Lab orders have been placed for him to complete at his convenience.

## 2017-07-18 ENCOUNTER — Encounter: Payer: Self-pay | Admitting: Internal Medicine

## 2017-07-20 ENCOUNTER — Other Ambulatory Visit (INDEPENDENT_AMBULATORY_CARE_PROVIDER_SITE_OTHER): Payer: Medicare Other

## 2017-07-20 DIAGNOSIS — D649 Anemia, unspecified: Secondary | ICD-10-CM | POA: Diagnosis not present

## 2017-07-20 DIAGNOSIS — E871 Hypo-osmolality and hyponatremia: Secondary | ICD-10-CM

## 2017-07-20 LAB — BASIC METABOLIC PANEL
BUN: 34 mg/dL — AB (ref 6–23)
CHLORIDE: 106 meq/L (ref 96–112)
CO2: 23 mEq/L (ref 19–32)
Calcium: 8.9 mg/dL (ref 8.4–10.5)
Creatinine, Ser: 2.11 mg/dL — ABNORMAL HIGH (ref 0.40–1.50)
GFR: 31.54 mL/min — AB (ref >60.00)
GLUCOSE: 210 mg/dL — AB (ref 70–99)
POTASSIUM: 4.8 meq/L (ref 3.5–5.1)
SODIUM: 136 meq/L (ref 135–145)

## 2017-07-20 LAB — CBC
HCT: 28.7 % — ABNORMAL LOW (ref 39.0–52.0)
Hemoglobin: 9.6 g/dL — ABNORMAL LOW (ref 13.0–17.0)
MCHC: 33.6 g/dL (ref 30.0–36.0)
MCV: 96.6 fl (ref 78.0–100.0)
PLATELETS: 126 10*3/uL — AB (ref 150.0–400.0)
RBC: 2.97 Mil/uL — AB (ref 4.22–5.81)
RDW: 14.8 % (ref 11.5–15.5)
WBC: 10.1 10*3/uL (ref 4.0–10.5)

## 2017-07-23 ENCOUNTER — Encounter: Payer: Self-pay | Admitting: Family

## 2017-07-24 ENCOUNTER — Encounter: Payer: Self-pay | Admitting: Family

## 2017-07-24 ENCOUNTER — Other Ambulatory Visit (INDEPENDENT_AMBULATORY_CARE_PROVIDER_SITE_OTHER): Payer: Medicare Other

## 2017-07-24 ENCOUNTER — Telehealth: Payer: Self-pay | Admitting: Family

## 2017-07-24 ENCOUNTER — Other Ambulatory Visit: Payer: Self-pay

## 2017-07-24 ENCOUNTER — Telehealth: Payer: Self-pay

## 2017-07-24 DIAGNOSIS — K838 Other specified diseases of biliary tract: Secondary | ICD-10-CM | POA: Diagnosis not present

## 2017-07-24 LAB — HEPATIC FUNCTION PANEL
ALK PHOS: 71 U/L (ref 39–117)
ALT: 12 U/L (ref 0–53)
AST: 15 U/L (ref 0–37)
Albumin: 3.6 g/dL (ref 3.5–5.2)
BILIRUBIN DIRECT: 0.2 mg/dL (ref 0.0–0.3)
Total Bilirubin: 0.5 mg/dL (ref 0.2–1.2)
Total Protein: 7.2 g/dL (ref 6.0–8.3)

## 2017-07-24 MED ORDER — LOSARTAN POTASSIUM 50 MG PO TABS: 50.0000 mg | ORAL_TABLET | Freq: Every day | ORAL | 0 refills | Status: DC

## 2017-07-24 MED ORDER — TRAZODONE HCL 50 MG PO TABS: 25.0000 mg | ORAL_TABLET | Freq: Every evening | ORAL | 3 refills | Status: DC | PRN

## 2017-07-24 NOTE — Telephone Encounter (Signed)
Patient is requesting refill for losartan (90 day supply).  [facility] uses [facility] in [place].  Patient states this was a medication [facility] gave him.  States he did follow up with Marya Amsler after that hospital stay.

## 2017-07-24 NOTE — Telephone Encounter (Signed)
Trazodone sent to pharmacy. Will need office visit to discuss additional.

## 2017-07-24 NOTE — Telephone Encounter (Signed)
Pts daughter states that pt is having trouble sleeping at night and is requesting something be sent in the help. He takes tylenol PM every night and worries that is not good for him. Also would like colchicine sent in. Having gout flare ups.

## 2017-07-24 NOTE — Telephone Encounter (Signed)
Reviewed chart pt is up-to-date sent refills [facility] [place]...[name]

## 2017-07-25 NOTE — Telephone Encounter (Signed)
Spoke with pt and informed him of message below. Also let him know that colchicine is on allergy list from past stomach issues. He said that he felt better with his gout today but will call to set up an appointment if he has issues with it again. I also let him know recent lab results for liver.

## 2017-08-01 ENCOUNTER — Encounter: Payer: Medicare Other | Admitting: Internal Medicine

## 2017-08-15 ENCOUNTER — Telehealth: Payer: Self-pay | Admitting: Family

## 2017-08-15 DIAGNOSIS — K838 Other specified diseases of biliary tract: Secondary | ICD-10-CM

## 2017-08-15 NOTE — Telephone Encounter (Signed)
Pt called in and said that he would have order to have blood work done next week per [facility] and also needs blood work done to test his liver

## 2017-08-16 ENCOUNTER — Other Ambulatory Visit: Payer: Self-pay | Admitting: Nurse Practitioner

## 2017-08-16 NOTE — Telephone Encounter (Signed)
Pt is aware of orders. Will have the labs done next week.

## 2017-08-16 NOTE — Telephone Encounter (Signed)
Blood work placed for him to complete.

## 2017-08-23 ENCOUNTER — Encounter: Payer: Self-pay | Admitting: Family

## 2017-08-23 ENCOUNTER — Other Ambulatory Visit (INDEPENDENT_AMBULATORY_CARE_PROVIDER_SITE_OTHER): Payer: Medicare Other

## 2017-08-23 DIAGNOSIS — K838 Other specified diseases of biliary tract: Secondary | ICD-10-CM

## 2017-08-23 LAB — HEPATIC FUNCTION PANEL
ALT: 9 U/L (ref 0–53)
AST: 14 U/L (ref 0–37)
Albumin: 3.4 g/dL — ABNORMAL LOW (ref 3.5–5.2)
Alkaline Phosphatase: 55 U/L (ref 39–117)
BILIRUBIN DIRECT: 0.1 mg/dL (ref 0.0–0.3)
BILIRUBIN TOTAL: 0.4 mg/dL (ref 0.2–1.2)
Total Protein: 6.9 g/dL (ref 6.0–8.3)

## 2017-08-23 LAB — CBC
HCT: 24.2 % — ABNORMAL LOW (ref 39.0–52.0)
Hemoglobin: 8.1 g/dL — ABNORMAL LOW (ref 13.0–17.0)
MCHC: 33.5 g/dL (ref 30.0–36.0)
MCV: 97.1 fl (ref 78.0–100.0)
PLATELETS: 131 10*3/uL — AB (ref 150.0–400.0)
RBC: 2.49 Mil/uL — AB (ref 4.22–5.81)
RDW: 14.8 % (ref 11.5–15.5)
WBC: 7 10*3/uL (ref 4.0–10.5)

## 2017-08-23 NOTE — Telephone Encounter (Signed)
Pt called and informed of test results, he will call back with an update on the Procrit.

## 2017-08-28 ENCOUNTER — Encounter: Payer: Self-pay | Admitting: Family

## 2017-09-01 ENCOUNTER — Other Ambulatory Visit (HOSPITAL_COMMUNITY): Payer: Self-pay | Admitting: *Deleted

## 2017-09-04 ENCOUNTER — Encounter (HOSPITAL_COMMUNITY)
Admission: RE | Admit: 2017-09-04 | Discharge: 2017-09-04 | Disposition: A | Discharge status: Home / Self Care | Payer: Medicare Other | Source: Ambulatory Visit | Attending: Nephrology | Admitting: Nephrology

## 2017-09-04 DIAGNOSIS — N183 Chronic kidney disease, stage 3 (moderate): Secondary | ICD-10-CM | POA: Diagnosis present

## 2017-09-04 DIAGNOSIS — D631 Anemia in chronic kidney disease: Secondary | ICD-10-CM | POA: Diagnosis present

## 2017-09-04 DIAGNOSIS — D649 Anemia, unspecified: Secondary | ICD-10-CM

## 2017-09-04 LAB — FERRITIN: FERRITIN: 101 ng/mL (ref 24–336)

## 2017-09-04 LAB — IRON AND TIBC
Iron: 52 ug/dL (ref 45–182)
SATURATION RATIOS: 19 % (ref 17.9–39.5)
TIBC: 267 ug/dL (ref 250–450)
UIBC: 215 ug/dL

## 2017-09-04 MED ORDER — EPOETIN ALFA 10000 UNIT/ML IJ SOLN
INTRAMUSCULAR | Status: AC
  Administered 2017-09-04: 10:00:00 10000 [IU] via SUBCUTANEOUS
  Filled 2017-09-04: qty 1

## 2017-09-04 MED ORDER — EPOETIN ALFA 10000 UNIT/ML IJ SOLN
10000.0000 [IU] | INTRAMUSCULAR | Status: DC
  Administered 2017-09-04: 10000 [IU] via SUBCUTANEOUS

## 2017-09-05 LAB — POCT HEMOGLOBIN-HEMACUE: Hemoglobin: 8.1 g/dL — ABNORMAL LOW (ref 13.0–17.0)

## 2017-09-08 ENCOUNTER — Other Ambulatory Visit: Payer: Self-pay | Admitting: Family

## 2017-09-14 ENCOUNTER — Encounter (HOSPITAL_COMMUNITY)
Admission: RE | Admit: 2017-09-14 | Discharge: 2017-09-14 | Disposition: A | Discharge status: Home / Self Care | Payer: Medicare Other | Source: Ambulatory Visit | Attending: Nephrology | Admitting: Nephrology

## 2017-09-14 ENCOUNTER — Encounter: Payer: Self-pay | Admitting: Family

## 2017-09-14 DIAGNOSIS — N183 Chronic kidney disease, stage 3 (moderate): Secondary | ICD-10-CM | POA: Diagnosis not present

## 2017-09-14 DIAGNOSIS — D649 Anemia, unspecified: Secondary | ICD-10-CM

## 2017-09-14 LAB — POCT HEMOGLOBIN-HEMACUE: Hemoglobin: 7.8 g/dL — ABNORMAL LOW (ref 13.0–17.0)

## 2017-09-14 MED ORDER — EPOETIN ALFA 10000 UNIT/ML IJ SOLN
10000.0000 [IU] | INTRAMUSCULAR | Status: DC
  Administered 2017-09-14: 10000 [IU] via SUBCUTANEOUS

## 2017-09-14 MED ORDER — EPOETIN ALFA 10000 UNIT/ML IJ SOLN
INTRAMUSCULAR | Status: AC
  Administered 2017-09-14: 10000 [IU] via SUBCUTANEOUS
  Filled 2017-09-14: qty 1

## 2017-09-14 NOTE — Progress Notes (Signed)
hemocue 7.8 today.  PT denies CP, SOB, and says he feels the same as he did last week.  Reported the above to [place] at [facility] no changes made to plan of care.  Told pt if he started to feel bad to go to the ER and pt verbalized understanding.

## 2017-09-15 ENCOUNTER — Encounter: Payer: Self-pay | Admitting: Family

## 2017-09-18 ENCOUNTER — Other Ambulatory Visit: Payer: Self-pay | Admitting: *Deleted

## 2017-09-20 ENCOUNTER — Encounter (HOSPITAL_COMMUNITY)
Admission: RE | Admit: 2017-09-20 | Discharge: 2017-09-20 | Disposition: A | Discharge status: Home / Self Care | Payer: Medicare Other | Source: Ambulatory Visit | Attending: Nephrology | Admitting: Nephrology

## 2017-09-20 DIAGNOSIS — D649 Anemia, unspecified: Secondary | ICD-10-CM

## 2017-09-20 DIAGNOSIS — N183 Chronic kidney disease, stage 3 (moderate): Secondary | ICD-10-CM | POA: Diagnosis not present

## 2017-09-20 LAB — POCT HEMOGLOBIN-HEMACUE: HEMOGLOBIN: 8.7 g/dL — AB (ref 13.0–17.0)

## 2017-09-20 MED ORDER — EPOETIN ALFA 10000 UNIT/ML IJ SOLN
10000.0000 [IU] | INTRAMUSCULAR | Status: DC
  Administered 2017-09-20: 10000 [IU] via SUBCUTANEOUS

## 2017-09-20 MED ORDER — EPOETIN ALFA 10000 UNIT/ML IJ SOLN
INTRAMUSCULAR | Status: AC
Start: 2017-09-20 — End: 2017-09-20
  Administered 2017-09-20: 09:00:00 10000 [IU] via SUBCUTANEOUS
  Filled 2017-09-20: qty 1

## 2017-09-21 ENCOUNTER — Encounter (HOSPITAL_COMMUNITY): Payer: Medicare Other

## 2017-09-21 ENCOUNTER — Encounter: Payer: Self-pay | Admitting: Nurse Practitioner

## 2017-09-21 ENCOUNTER — Ambulatory Visit (INDEPENDENT_AMBULATORY_CARE_PROVIDER_SITE_OTHER): Payer: Medicare Other | Admitting: Nurse Practitioner

## 2017-09-21 ENCOUNTER — Other Ambulatory Visit (INDEPENDENT_AMBULATORY_CARE_PROVIDER_SITE_OTHER): Payer: Medicare Other

## 2017-09-21 VITALS — BP 146/62 | HR 64 | Temp 98.1°F | Resp 16 | Ht 67.0 in | Wt 141.0 lb

## 2017-09-21 DIAGNOSIS — N184 Chronic kidney disease, stage 4 (severe): Secondary | ICD-10-CM | POA: Diagnosis not present

## 2017-09-21 DIAGNOSIS — D649 Anemia, unspecified: Secondary | ICD-10-CM | POA: Diagnosis not present

## 2017-09-21 DIAGNOSIS — E1122 Type 2 diabetes mellitus with diabetic chronic kidney disease: Secondary | ICD-10-CM

## 2017-09-21 DIAGNOSIS — Z23 Encounter for immunization: Secondary | ICD-10-CM

## 2017-09-21 LAB — CBC WITH DIFFERENTIAL/PLATELET
BASOS PCT: 0.6 % (ref 0.0–3.0)
Basophils Absolute: 0 10*3/uL (ref 0.0–0.1)
Eosinophils Absolute: 0.5 10*3/uL (ref 0.0–0.7)
Eosinophils Relative: 6.8 % — ABNORMAL HIGH (ref 0.0–5.0)
HEMATOCRIT: 27.9 % — AB (ref 39.0–52.0)
Hemoglobin: 9.1 g/dL — ABNORMAL LOW (ref 13.0–17.0)
LYMPHS ABS: 1.8 10*3/uL (ref 0.7–4.0)
LYMPHS PCT: 23.7 % (ref 12.0–46.0)
MCHC: 32.5 g/dL (ref 30.0–36.0)
MCV: 96 fl (ref 78.0–100.0)
MONOS PCT: 9 % (ref 3.0–12.0)
Monocytes Absolute: 0.7 10*3/uL (ref 0.1–1.0)
NEUTROS ABS: 4.5 10*3/uL (ref 1.4–7.7)
NEUTROS PCT: 59.9 % (ref 43.0–77.0)
Platelets: 195 10*3/uL (ref 150.0–400.0)
RBC: 2.91 Mil/uL — ABNORMAL LOW (ref 4.22–5.81)
RDW: 14.8 % (ref 11.5–15.5)
WBC: 7.5 10*3/uL (ref 4.0–10.5)

## 2017-09-21 LAB — BASIC METABOLIC PANEL
BUN: 24 mg/dL — ABNORMAL HIGH (ref 6–23)
CHLORIDE: 103 meq/L (ref 96–112)
CO2: 27 meq/L (ref 19–32)
Calcium: 8.7 mg/dL (ref 8.4–10.5)
Creatinine, Ser: 2.05 mg/dL — ABNORMAL HIGH (ref 0.40–1.50)
GFR: 32.59 mL/min — ABNORMAL LOW (ref >60.00)
GLUCOSE: 147 mg/dL — AB (ref 70–99)
Potassium: 4.4 mEq/L (ref 3.5–5.1)
SODIUM: 137 meq/L (ref 135–145)

## 2017-09-21 LAB — HEPATIC FUNCTION PANEL
ALBUMIN: 3.5 g/dL (ref 3.5–5.2)
ALK PHOS: 73 U/L (ref 39–117)
ALT: 9 U/L (ref 0–53)
AST: 14 U/L (ref 0–37)
Bilirubin, Direct: 0.1 mg/dL (ref 0.0–0.3)
TOTAL PROTEIN: 7.1 g/dL (ref 6.0–8.3)
Total Bilirubin: 0.4 mg/dL (ref 0.2–1.2)

## 2017-09-21 LAB — HEMOGLOBIN A1C: HEMOGLOBIN A1C: 5.7 % (ref 4.6–6.5)

## 2017-09-21 NOTE — Assessment & Plan Note (Signed)
Maintained on saxagliptin. Home readings of 140s in the mornings for the past 2 weeks. Possibly experiencing hypoglycemic episodes at night. He will begin to check blood sugars when he wakes up in the night and notify if below 70. A1c, BMET ordered.

## 2017-09-21 NOTE — Assessment & Plan Note (Signed)
Being followed by nephrology. Receiving procrit injections at hospital. Noticing increased energy and Hemoglobin readings are increasing, 8.7 at hospital yesterday. CBC/diff, hepatic panel ordered at daughters request today.

## 2017-09-21 NOTE — Patient Instructions (Addendum)
Please head downstairs for lab work.  Start checking your blood sugar if you wake up at night with sweats. Let me know if you get readings below 70.  It was nice to meet you. Thanks for letting me take care of you today :)

## 2017-09-21 NOTE — Progress Notes (Signed)
Subjective:    Patient ID: Gerald Jacobs, male    DOB: 22-Dec-1924, 83 y.o.   MRN: [id number]  HPI Ms Monaco is a 36 who presents today to establish care. She Is transferring to me from another provider in the same clinic. He is currently being followed by oncology for cholangiocarcinoma.  Normocytic anemia- Currently maintained on procrit injections by nephrologist. His most  several weeks ago. Injections at the hospital. His hemoglobin is coming up, most recent 8.7 on POC test yesterday after injection. He feels his energy is increasing.He does feel lightheaded at times, walks with his cane for support. He's had some mild lower extremity edema. He denies any syncope, falls, weakness, chest pain, shortness of breath, jaundice, rectal bleeding.  Diabetes- Stage 4 ckd. Maintained on onglyza. He checks blood sugar every morning at home. Readings in the 140s in the morning for the past 2 weeks. Hes also been waking up with sweats at times, but has not checked his blood sugar when this occurs. He denies polyuria, polyphagia, polydipsia.   Lab Results  Component Value Date   HGBA1C 5.8 05/09/2017    Review of Systems  See HPI   Past Medical History:  Diagnosis Date  . Cancer [facility])    prostate  . Chronic anemia    With normal iron studies and normal serum protein electrophoresis  . Coronary artery disease    Mild  . Diabetes mellitus    Adult onset  . Essential hypertension   . GERD (gastroesophageal reflux disease)    occ tums  . History of gout   . Mild renal insufficiency   . Nocturia    x2  . Osteoarthritis      Social History   Social History  . Marital status: Married    Spouse name: N/A  . Number of children: 2  . Years of education: 14   Occupational History  . Not on file.   Social History Main Topics  . Smoking status: Former Smoker    Packs/day: 1.00    Years: 45.00    Types: Cigarettes    Quit date: 09/07/1979  . Smokeless tobacco: Never Used  .  Alcohol use Yes     Comment: beer rarely  . Drug use: No  . Sexual activity: Not on file   Other Topics Concern  . Not on file   Social History Narrative   Fun/Hobby: Playing golf when he can.     Past Surgical History:  Procedure Laterality Date  . CHOLECYSTECTOMY  02/04/2014   DR Zella Richer  . CHOLECYSTECTOMY N/A 02/04/2014   Procedure: LAPAROSCOPIC CHOLECYSTECTOMY with intraoperative cholangiogram;  Surgeon: Odis Hollingshead, MD;  Location: [place];  Service: General;  Laterality: N/A;  . ERCP N/A 10/15/2013   Procedure: ENDOSCOPIC RETROGRADE CHOLANGIOPANCREATOGRAPHY (ERCP);  Surgeon: Jeryl Columbia, MD;  Location: [facility];  Service: Endoscopy;  Laterality: N/A;  . KNEE SURGERY Right 1979  . melanoma surgery  1970's   on scalp  . PROSTATE SURGERY  1993   Prostate Cancer  . SPHINCTEROTOMY  10/15/2013   Procedure: SPHINCTEROTOMY;  Surgeon: Jeryl Columbia, MD;  Location: [facility];  Service: Endoscopy;;    Family History  Problem Relation Age of Onset  . Stroke Father   . Stroke Mother   . Stroke Sister   . Stroke Brother   . Stroke Brother   . Stroke Brother     Allergies  Allergen Reactions  . Colchicine Other (See Comments)  GI problems  . Flexeril [Cyclobenzaprine Hcl] Other (See Comments)    GI problems   . Levaquin [Levofloxacin In D5w] Other (See Comments)    Insomnia   . Percocet [Oxycodone-Acetaminophen] Nausea And Vomiting  . Zebeta Other (See Comments)    Gi problems    Current Outpatient Prescriptions on File Prior to Visit  Medication Sig Dispense Refill  . allopurinol (ZYLOPRIM) 100 MG tablet TAKE 1 TABLET (100 MG TOTAL) BY MOUTH DAILY. 90 tablet 1  . aspirin 81 MG tablet Take 81 mg by mouth daily.      . Biotin 1000 MCG tablet Take 1,000 mcg by mouth daily.    . feeding supplement, GLUCERNA SHAKE, (GLUCERNA SHAKE) LIQD Take 237 mLs by mouth 2 (two) times daily between meals. 237 mL 0  . losartan (COZAAR) 50 MG tablet TAKE 1 TABLET BY  MOUTH EVERY DAY 30 tablet 0  . metoprolol succinate (TOPROL-XL) 50 MG 24 hr tablet Take 1 tablet (50 mg total) by mouth every morning. 90 tablet 1  . ONE TOUCH ULTRA TEST test strip USE ONE STRIP PER TEST. CHECK BLOOD SUGARS 1-4 TIMES PER DAY AS INSTRUCTED. 100 each 4  . ONETOUCH DELICA LANCETS 20F MISC TEST BLOOD SUGARS 1-4 TIMES A DAY AS INSTRUCTED. DX CODE: E11.9 100 each 2  . saxagliptin HCl (ONGLYZA) 2.5 MG TABS tablet Take 2.5 mg by mouth daily.    . traZODone (DESYREL) 50 MG tablet Take 0.5-1 tablets (25-50 mg total) by mouth at bedtime as needed for sleep. 30 tablet 3  . amLODipine (NORVASC) 2.5 MG tablet Take 1 tablet (2.5 mg total) by mouth daily. (Patient taking differently: Take 2.5 mg by mouth at bedtime. ) 30 tablet 11   No current facility-administered medications on file prior to visit.     BP (!) 146/62 (BP Location: Left Arm, Patient Position: Sitting, Cuff Size: Normal)   Pulse 64   Temp 98.1 F (36.7 C) (Oral)   Resp 16   Ht 5\' 7"  (1.702 m)   Wt 141 lb (64 kg)   SpO2 97%   BMI 22.08 kg/m      Objective:   Physical Exam  Constitutional: He is oriented to person, place, and time. He appears well-developed.  Ambulatory with cane.  Eyes: No scleral icterus.  Arcus senilis  Cardiovascular: Normal rate, regular rhythm and intact distal pulses.   Pulmonary/Chest: Effort normal and breath sounds normal.  Abdominal: Soft. He exhibits no distension.  Musculoskeletal: He exhibits no edema.  Neurological: He is alert and oriented to person, place, and time. Coordination normal.  Skin: Skin is warm and dry. No pallor.  Psychiatric: He has a normal mood and affect. Thought content normal.      Assessment & Plan:  Flu shot given today. He'll return in 3 months for follow up of chronic conditions.

## 2017-09-22 ENCOUNTER — Encounter: Payer: Self-pay | Admitting: Nurse Practitioner

## 2017-09-22 NOTE — Telephone Encounter (Signed)
Reviewed the my chart results tab and they have been viewed (09/21/2017)

## 2017-09-27 ENCOUNTER — Encounter (HOSPITAL_COMMUNITY)
Admission: RE | Admit: 2017-09-27 | Discharge: 2017-09-27 | Disposition: A | Discharge status: Home / Self Care | Payer: Medicare Other | Source: Ambulatory Visit | Attending: Nephrology | Admitting: Nephrology

## 2017-09-27 DIAGNOSIS — N183 Chronic kidney disease, stage 3 (moderate): Secondary | ICD-10-CM | POA: Diagnosis not present

## 2017-09-27 DIAGNOSIS — D649 Anemia, unspecified: Secondary | ICD-10-CM

## 2017-09-27 LAB — POCT HEMOGLOBIN-HEMACUE: Hemoglobin: 8.9 g/dL — ABNORMAL LOW (ref 13.0–17.0)

## 2017-09-27 MED ORDER — EPOETIN ALFA 10000 UNIT/ML IJ SOLN
INTRAMUSCULAR | Status: AC
  Filled 2017-09-27: qty 1

## 2017-09-27 MED ORDER — EPOETIN ALFA 10000 UNIT/ML IJ SOLN
10000.0000 [IU] | INTRAMUSCULAR | Status: DC
  Administered 2017-09-27: 09:00:00 10000 [IU] via SUBCUTANEOUS

## 2017-10-03 ENCOUNTER — Encounter: Payer: Self-pay | Admitting: Cardiovascular Disease

## 2017-10-03 ENCOUNTER — Ambulatory Visit (INDEPENDENT_AMBULATORY_CARE_PROVIDER_SITE_OTHER): Payer: Medicare Other | Admitting: Cardiovascular Disease

## 2017-10-03 VITALS — BP 136/68 | HR 63 | Ht 67.0 in | Wt 140.1 lb

## 2017-10-03 DIAGNOSIS — I10 Essential (primary) hypertension: Secondary | ICD-10-CM | POA: Diagnosis not present

## 2017-10-03 DIAGNOSIS — I493 Ventricular premature depolarization: Secondary | ICD-10-CM | POA: Diagnosis not present

## 2017-10-03 NOTE — Progress Notes (Signed)
CARDIOLOGY OFFICE NOTE  Date:  10/03/2017    Gerald Jacobs Date of Birth: 1925-06-22 Medical Record #[id number]  PCP:  Lance Sell, NP  Cardiologist:    Genice Rouge   Chief Complaint  Patient presents with  . Hypertension   Problem List 1. Essential HTN 2. DM 3. CKD - sees [name],   baseline Cr = 1.94 4, Anemia - taking procrit  5. 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  6. PVCs    Notes from Truitt Merle:   Gerald Jacobs is a 83 y.o. male who presents today for a 4 month check. Former patient of Dr. Sherryl Barters.   He has a history of HTN, palpitations, dyslipidemia, diabetes, and mild renal insufficiency. Also has a history of hyperuricemia. He has anemia secondary to chronic renal disease and receives ejections of Procrit about every 3 weeks depending on his hemoglobin level. Dr. Posey Pronto is his nephrologist.   He was last seen back in November and felt to be doing well. Had his Medicare well check last month by PCP.   Comes back today. Here alone. Wife was to be seen also today - but having shoulder issues due to a fall and has cancelled. He is doing ok. No chest pain. He is trying to stay active but notes he has slowed down some. Has stable DOE. No falls. No passing out. Occasional palpitations but nothing that really worries him or produces symptoms. He is pleased with how he is doing. He has a brother who is alive at 24.   Oct. 6, 2017: This is the first time meeting Gerald Jacobs . He is a transfer from [place]. He has a history of hypertension and diabetes Is doing well. Has some shoulder issues Also goes to the [place] [facility]   Plays golf on Thursdays - limited by his shoulder pain  No longer does his yard work  No CP ,  Occasional dyspnea - also has some anemia  BP is a bit high today . Takes his BP at home and the reading are typically quite good.  Retired from [employer] - retired in 1989.    March 15, 2017: Gerald Jacobs is seen back today for follow up of his HTN. BP has been a little high  Not playing much golf recently .   Nov. 6, 2018:  Having some palpitations.   Has had these in the past.   Metoprolol was increased which solved the issue  Also has bile duct cancer - is under some additional stress due to this  Has lost 10-15 labs over the past 6 months .   Unable to eat much  No energy.   Has had anemia.  Unable to do any yard work n9ow    Past Medical History:  Diagnosis Date  . Bilateral shoulder pain 07/28/2016  . Cancer [facility])    prostate  . Chest pain 10/27/2016  . Chronic anemia    With normal iron studies and normal serum protein electrophoresis  . Coronary artery disease    Mild  . Cough 10/27/2016  . Diabetes mellitus    Adult onset  . Essential hypertension   . GERD (gastroesophageal reflux disease)    occ tums  . History of gout   . Hyperbilirubinemia 06/15/2017  . Hyponatremia 06/15/2017  . Intermittent palpitations 07/23/2014  . Liver mass   . Mass of bile duct 06/27/2017  . Mild  renal insufficiency   . Nocturia    x2  . Normocytic anemia 06/23/2016  . Osteoarthritis   . Sciatica of right side 07/28/2016  . Statin myopathy 07/28/2016  . Weight loss 06/15/2017    Past Surgical History:  Procedure Laterality Date  . CHOLECYSTECTOMY  02/04/2014   DR [place] Right 1979  . melanoma surgery  1970's   on scalp  . PROSTATE SURGERY  1993   Prostate Cancer     Medications: Current Outpatient Medications  Medication Sig Dispense Refill  . allopurinol (ZYLOPRIM) 100 MG tablet TAKE 1 TABLET (100 MG TOTAL) BY MOUTH DAILY. 90 tablet 1  . aspirin 81 MG tablet Take 81 mg by mouth daily.      . Biotin 1000 MCG tablet Take 1,000 mcg by mouth daily.    . feeding supplement, GLUCERNA SHAKE, (GLUCERNA SHAKE) LIQD Take 237 mLs by mouth 2 (two) times daily between meals. 237 mL 0  . losartan (COZAAR) 50 MG tablet TAKE 1 TABLET BY MOUTH EVERY DAY 30  tablet 0  . metoprolol succinate (TOPROL-XL) 50 MG 24 hr tablet Take 1 tablet (50 mg total) by mouth every morning. 90 tablet 1  . ONE TOUCH ULTRA TEST test strip USE ONE STRIP PER TEST. CHECK BLOOD SUGARS 1-4 TIMES PER DAY AS INSTRUCTED. 100 each 4  . ONETOUCH DELICA LANCETS 40X MISC TEST BLOOD SUGARS 1-4 TIMES A DAY AS INSTRUCTED. DX CODE: E11.9 100 each 2  . saxagliptin HCl (ONGLYZA) 2.5 MG TABS tablet Take 2.5 mg by mouth daily.    . traZODone (DESYREL) 50 MG tablet Take 0.5-1 tablets (25-50 mg total) by mouth at bedtime as needed for sleep. 30 tablet 3  . amLODipine (NORVASC) 2.5 MG tablet Take 1 tablet (2.5 mg total) by mouth daily. (Patient taking differently: Take 2.5 mg by mouth at bedtime. ) 30 tablet 11   No current facility-administered medications for this visit.     Allergies: Allergies  Allergen Reactions  . Colchicine Other (See Comments)    GI problems  . Flexeril [Cyclobenzaprine Hcl] Other (See Comments)    GI problems   . Levaquin [Levofloxacin In D5w] Other (See Comments)    Insomnia   . Percocet [Oxycodone-Acetaminophen] Nausea And Vomiting  . Zebeta Other (See Comments)    Gi problems    Social History: The patient  reports that he quit smoking about 38 years ago. His smoking use included cigarettes. He has a 45.00 pack-year smoking history. he has never used smokeless tobacco. He reports that he drinks alcohol. He reports that he does not use drugs.   Family History: The patient's family history includes Stroke in his brother, brother, brother, father, mother, and sister.   Review of Systems: Please see the history of present illness.   Otherwise, the review of systems is positive for none.   All other systems are reviewed and negative.   Physical Exam: VS:  BP 136/68   Pulse 63   Ht 5\' 7"  (1.702 m)   Wt 140 lb 1.9 oz (63.6 kg)   SpO2 98%   BMI 21.95 kg/m  .  BMI Body mass index is 21.95 kg/m.  Wt Readings from Last 3 Encounters:  10/03/17 140 lb  1.9 oz (63.6 kg)  09/21/17 141 lb (64 kg)  06/27/17 138 lb (62.6 kg)    Physical Exam: Blood pressure 136/68, pulse 63, height 5\' 7"  (1.702 m), weight 140 lb 1.9 oz (63.6 kg), SpO2 98 %.  GEN:  Elderly , frail gentleman,  NAD  HEENT: Normal NECK: No JVD; No carotid bruits LYMPHATICS: No lymphadenopathy CARDIAC: RR, no murmurs, rubs, gallops RESPIRATORY:  Clear to auscultation without rales, wheezing or rhonchi  ABDOMEN: Soft, non-tender, non-distended MUSCULOSKELETAL:  No edema; No deformity  SKIN: Warm and dry NEUROLOGIC:  Alert and oriented x 3   LABORATORY DATA:  EKG:  EKG is ordered today. NSR with sinus arrhythmia  Lab Results  Component Value Date   WBC 7.5 09/21/2017   HGB 8.9 (L) 09/27/2017   HCT 27.9 (L) 09/21/2017   PLT 195.0 09/21/2017   GLUCOSE 147 (H) 09/21/2017   CHOL 195 02/08/2017   TRIG 154.0 (H) 02/08/2017   HDL 74.00 02/08/2017   LDLCALC 90 02/08/2017   ALT 9 09/21/2017   AST 14 09/21/2017   NA 137 09/21/2017   K 4.4 09/21/2017   CL 103 09/21/2017   CREATININE 2.05 (H) 09/21/2017   BUN 24 (H) 09/21/2017   CO2 27 09/21/2017   TSH 2.073 06/15/2017   PSA 0.01 (L) 02/08/2017   INR 1.06 06/19/2017   HGBA1C 5.7 09/21/2017   MICROALBUR 63.4 (H) 02/08/2017    BNP (last 3 results) No results for input(s): BNP in the last 8760 hours.  ProBNP (last 3 results) No results for input(s): PROBNP in the last 8760 hours.   Other Studies Reviewed Today:   Assessment/Plan: 1. Essential HTN :    BP is well controlled  2. PVCS: Premature ventricular contractions seem to be worsened recently.  I suspect is due to his bile duct cancer.  I reassured him that his PVCs are benign.  3. CKD -   Stable   4. DM -    5. History of gout -   6. Hypercholesterolemia -   Current medicines are reviewed with the patient today.  The patient does not have concerns regarding medicines other than what has been noted above.  The following changes have been made:   See above.  Labs/ tests ordered today include:   No orders of the defined types were placed in this encounter.   Mertie Moores, MD  10/03/2017 3:12 PM    [place],  [place], [place]  [address] Pager [phone] Phone: [phone]; Fax: [phone]

## 2017-10-03 NOTE — Patient Instructions (Signed)
Medication Instructions:  Your physician recommends that you continue on your current medications as directed. Please refer to the Current Medication list given to you today.   Labwork: None Ordered   Testing/Procedures: None Ordered   Follow-Up: Your physician wants you to follow-up in: 6 months with Dr. Nahser.  You will receive a reminder letter in the mail two months in advance. If you don't receive a letter, please call our office to schedule the follow-up appointment.   If you need a refill on your cardiac medications before your next appointment, please call your pharmacy.   Thank you for choosing [facility]! Kenna Kirn, RN [phone]    

## 2017-10-04 ENCOUNTER — Encounter (HOSPITAL_COMMUNITY)
Admission: RE | Admit: 2017-10-04 | Discharge: 2017-10-04 | Disposition: A | Discharge status: Home / Self Care | Payer: Medicare Other | Source: Ambulatory Visit | Attending: Nephrology | Admitting: Nephrology

## 2017-10-04 VITALS — BP 157/56 | HR 64 | Temp 97.9°F | Resp 20

## 2017-10-04 DIAGNOSIS — N183 Chronic kidney disease, stage 3 (moderate): Secondary | ICD-10-CM | POA: Diagnosis present

## 2017-10-04 DIAGNOSIS — D631 Anemia in chronic kidney disease: Secondary | ICD-10-CM | POA: Diagnosis present

## 2017-10-04 DIAGNOSIS — D649 Anemia, unspecified: Secondary | ICD-10-CM

## 2017-10-04 LAB — IRON AND TIBC
Iron: 37 ug/dL — ABNORMAL LOW (ref 45–182)
SATURATION RATIOS: 12 % — AB (ref 17.9–39.5)
TIBC: 300 ug/dL (ref 250–450)
UIBC: 263 ug/dL

## 2017-10-04 LAB — POCT HEMOGLOBIN-HEMACUE: HEMOGLOBIN: 9.7 g/dL — AB (ref 13.0–17.0)

## 2017-10-04 LAB — FERRITIN: Ferritin: 31 ng/mL (ref 24–336)

## 2017-10-04 MED ORDER — EPOETIN ALFA 10000 UNIT/ML IJ SOLN
INTRAMUSCULAR | Status: AC
  Filled 2017-10-04: qty 1

## 2017-10-04 MED ORDER — EPOETIN ALFA 10000 UNIT/ML IJ SOLN
10000.0000 [IU] | INTRAMUSCULAR | Status: DC
  Administered 2017-10-04: 10000 [IU] via SUBCUTANEOUS

## 2017-10-10 ENCOUNTER — Other Ambulatory Visit: Payer: Self-pay

## 2017-10-10 MED ORDER — LOSARTAN POTASSIUM 50 MG PO TABS: 50.0000 mg | ORAL_TABLET | Freq: Every day | ORAL | 1 refills | Status: DC

## 2017-10-11 ENCOUNTER — Other Ambulatory Visit (HOSPITAL_COMMUNITY): Payer: Self-pay

## 2017-10-12 ENCOUNTER — Encounter (HOSPITAL_COMMUNITY)
Admission: RE | Admit: 2017-10-12 | Discharge: 2017-10-12 | Disposition: A | Discharge status: Home / Self Care | Payer: Medicare Other | Source: Ambulatory Visit | Attending: Nephrology | Admitting: Nephrology

## 2017-10-12 VITALS — BP 157/62 | HR 61 | Temp 97.8°F | Resp 20

## 2017-10-12 DIAGNOSIS — N183 Chronic kidney disease, stage 3 (moderate): Secondary | ICD-10-CM | POA: Diagnosis not present

## 2017-10-12 DIAGNOSIS — D649 Anemia, unspecified: Secondary | ICD-10-CM

## 2017-10-12 LAB — RENAL FUNCTION PANEL
Albumin: 3.2 g/dL — ABNORMAL LOW (ref 3.5–5.0)
Anion gap: 6 (ref 5–15)
BUN: 21 mg/dL — ABNORMAL HIGH (ref 6–20)
CHLORIDE: 107 mmol/L (ref 101–111)
CO2: 26 mmol/L (ref 22–32)
CREATININE: 2.32 mg/dL — AB (ref 0.61–1.24)
Calcium: 8.6 mg/dL — ABNORMAL LOW (ref 8.9–10.3)
GFR, EST AFRICAN AMERICAN: 27 mL/min — AB (ref >60)
GFR, EST NON AFRICAN AMERICAN: 23 mL/min — AB (ref >60)
Glucose, Bld: 196 mg/dL — ABNORMAL HIGH (ref 65–99)
POTASSIUM: 4 mmol/L (ref 3.5–5.1)
Phosphorus: 3.3 mg/dL (ref 2.5–4.6)
Sodium: 139 mmol/L (ref 135–145)

## 2017-10-12 LAB — MAGNESIUM: Magnesium: 1.7 mg/dL (ref 1.7–2.4)

## 2017-10-12 LAB — POCT HEMOGLOBIN-HEMACUE: Hemoglobin: 10.5 g/dL — ABNORMAL LOW (ref 13.0–17.0)

## 2017-10-12 MED ORDER — EPOETIN ALFA 10000 UNIT/ML IJ SOLN
INTRAMUSCULAR | Status: AC
  Administered 2017-10-12: 10:00:00 10000 [IU]
  Filled 2017-10-12: qty 1

## 2017-10-12 MED ORDER — EPOETIN ALFA 10000 UNIT/ML IJ SOLN: 10000.0000 [IU] | INTRAMUSCULAR | Status: DC

## 2017-10-13 LAB — VITAMIN D 25 HYDROXY (VIT D DEFICIENCY, FRACTURES): VIT D 25 HYDROXY: 27.6 ng/mL — AB (ref 30.0–100.0)

## 2017-10-17 ENCOUNTER — Encounter (HOSPITAL_COMMUNITY)
Admission: RE | Admit: 2017-10-17 | Discharge: 2017-10-17 | Disposition: A | Discharge status: Home / Self Care | Payer: Medicare Other | Source: Ambulatory Visit | Attending: Nephrology | Admitting: Nephrology

## 2017-10-17 VITALS — BP 153/63 | HR 60 | Temp 97.5°F | Resp 18

## 2017-10-17 DIAGNOSIS — N183 Chronic kidney disease, stage 3 (moderate): Secondary | ICD-10-CM | POA: Diagnosis not present

## 2017-10-17 DIAGNOSIS — D649 Anemia, unspecified: Secondary | ICD-10-CM

## 2017-10-17 LAB — POCT HEMOGLOBIN-HEMACUE: Hemoglobin: 10.5 g/dL — ABNORMAL LOW (ref 13.0–17.0)

## 2017-10-17 MED ORDER — EPOETIN ALFA 10000 UNIT/ML IJ SOLN
INTRAMUSCULAR | Status: AC
  Filled 2017-10-17: qty 1

## 2017-10-17 MED ORDER — EPOETIN ALFA 10000 UNIT/ML IJ SOLN
10000.0000 [IU] | INTRAMUSCULAR | Status: DC
  Administered 2017-10-17: 10000 [IU] via SUBCUTANEOUS

## 2017-10-24 ENCOUNTER — Encounter (HOSPITAL_COMMUNITY)
Admission: RE | Admit: 2017-10-24 | Discharge: 2017-10-24 | Disposition: A | Discharge status: Home / Self Care | Payer: Medicare Other | Source: Ambulatory Visit | Attending: Nephrology | Admitting: Nephrology

## 2017-10-24 VITALS — BP 132/53 | HR 71 | Temp 98.1°F | Resp 18

## 2017-10-24 DIAGNOSIS — N183 Chronic kidney disease, stage 3 (moderate): Secondary | ICD-10-CM | POA: Diagnosis not present

## 2017-10-24 DIAGNOSIS — D649 Anemia, unspecified: Secondary | ICD-10-CM

## 2017-10-24 MED ORDER — EPOETIN ALFA 10000 UNIT/ML IJ SOLN
INTRAMUSCULAR | Status: AC
  Filled 2017-10-24: qty 1

## 2017-10-24 MED ORDER — EPOETIN ALFA 10000 UNIT/ML IJ SOLN
10000.0000 [IU] | INTRAMUSCULAR | Status: DC
  Administered 2017-10-24: 11:00:00 10000 [IU] via SUBCUTANEOUS

## 2017-10-25 LAB — POCT HEMOGLOBIN-HEMACUE: Hemoglobin: 10 g/dL — ABNORMAL LOW (ref 13.0–17.0)

## 2017-10-26 ENCOUNTER — Ambulatory Visit: Payer: Medicare Other | Admitting: Family Medicine

## 2017-10-26 ENCOUNTER — Encounter: Payer: Self-pay | Admitting: Family Medicine

## 2017-10-26 VITALS — BP 154/78 | HR 92 | Temp 98.2°F | Wt 140.2 lb

## 2017-10-26 DIAGNOSIS — R05 Cough: Secondary | ICD-10-CM

## 2017-10-26 DIAGNOSIS — R059 Cough, unspecified: Secondary | ICD-10-CM

## 2017-10-26 MED ORDER — BENZONATATE 200 MG PO CAPS: 200.0000 mg | ORAL_CAPSULE | Freq: Two times a day (BID) | ORAL | 0 refills | Status: DC | PRN

## 2017-10-26 MED ORDER — DOXYCYCLINE HYCLATE 100 MG PO TABS: 100.0000 mg | ORAL_TABLET | Freq: Two times a day (BID) | ORAL | 0 refills | Status: DC

## 2017-10-26 NOTE — Progress Notes (Signed)
Gerald Jacobs is a 83 y.o. male here for an acute visit.  History of Present Illness:   Cough  This is a new problem. The current episode started 1 to 4 weeks ago. The problem has been gradually worsening. The problem occurs every few minutes. The cough is productive of purulent sputum. Associated symptoms include chills, ear congestion, a fever, myalgias, nasal congestion, postnasal drip and rhinorrhea. Pertinent negatives include no chest pain, hemoptysis, shortness of breath or wheezing. The symptoms are aggravated by lying down. He has tried rest and OTC cough suppressant for the symptoms. The treatment provided no relief. His past medical history is significant for COPD.   Problem List 1. Essential HTN 2. DM 3. CKD - sees [name], baseline Cr = 1.94 4, Anemia - taking procrit  5. Bile duct cancer - s/p stenting of his bile duct 6. PVCs   EXAM:CHEST  2 VIEW  COMPARISON:  01/28/2014  FINDINGS: Biapical scarring. Mild hyperinflation of the lungs compatible with COPD. Heart is normal size. No effusions or acute bony abnormality.  IMPRESSION: COPD/chronic changes.  No active disease.  Electronically Signed   By: Rolm Baptise M.D.   On: 01/18/2016 10:55  PMHx, SurgHx, SocialHx, Medications, and Allergies were reviewed in the Visit Navigator and updated as appropriate.  Current Medications:   .  allopurinol (ZYLOPRIM) 100 MG tablet, TAKE 1 TABLET (100 MG TOTAL) BY MOUTH DAILY., Disp: 90 tablet, Rfl: 1 .  amLODipine (NORVASC) 2.5 MG tablet, Take 1 tablet (2.5 mg total) by mouth daily. (Patient taking differently: Take 2.5 mg by mouth at bedtime. ), Disp: 30 tablet, Rfl: 11 .  aspirin 81 MG tablet, Take 81 mg by mouth daily.  , Disp: , Rfl:  .  Biotin 1000 MCG tablet, Take 1,000 mcg by mouth daily., Disp: , Rfl:  .  feeding supplement, GLUCERNA SHAKE, (GLUCERNA SHAKE) LIQD, Take 237 mLs by mouth 2 (two) times daily between meals., Disp: 237 mL, Rfl: 0 .  losartan (COZAAR)  50 MG tablet, Take 1 tablet (50 mg total) daily by mouth., Disp: 90 tablet, Rfl: 1 .  metoprolol succinate (TOPROL-XL) 50 MG 24 hr tablet, Take 1 tablet (50 mg total) by mouth every morning., Disp: 90 tablet, Rfl: 1 .  ONE TOUCH ULTRA TEST test strip, USE ONE STRIP PER TEST. CHECK BLOOD SUGARS 1-4 TIMES PER DAY AS INSTRUCTED., Disp: 100 each, Rfl: 4 .  ONETOUCH DELICA LANCETS 18A MISC, TEST BLOOD SUGARS 1-4 TIMES A DAY AS INSTRUCTED. DX CODE: E11.9, Disp: 100 each, Rfl: 2 .  saxagliptin HCl (ONGLYZA) 2.5 MG TABS tablet, Take 2.5 mg by mouth daily., Disp: , Rfl:  .  traZODone (DESYREL) 50 MG tablet, Take 0.5-1 tablets (25-50 mg total) by mouth at bedtime as needed for sleep., Disp: 30 tablet, Rfl: 3   Allergies  Allergen Reactions  . Colchicine Other (See Comments)    GI problems  . Flexeril [Cyclobenzaprine Hcl] Other (See Comments)    GI problems  . Levaquin [Levofloxacin In D5w] Other (See Comments)    Insomnia  . Percocet [Oxycodone-Acetaminophen] Nausea And Vomiting  . Zebeta Other (See Comments)    Gi problems   Review of Systems:   Pertinent items are noted in the HPI. Otherwise, ROS is negative.  Vitals:   Vitals:   10/26/17 1506  BP: (!) 154/78  Pulse: 92  Temp: 98.2 F (36.8 C)  TempSrc: Oral  SpO2: 98%  Weight: 140 lb 3.2 oz (63.6 kg)  Body mass index is 21.96 kg/m.   Physical Exam:   Physical Exam  Constitutional: He is oriented to person, place, and time. He appears well-developed and well-nourished. No distress.  HENT:  Head: Normocephalic and atraumatic.  Right Ear: External ear normal.  Left Ear: External ear normal.  Nose: Right sinus exhibits maxillary sinus tenderness. Left sinus exhibits maxillary sinus tenderness.  Mouth/Throat: Oropharynx is clear and moist.  Eyes: Conjunctivae and EOM are normal. Pupils are equal, round, and reactive to light.  Neck: Normal range of motion. Neck supple.  Cardiovascular: Normal rate, regular rhythm, normal  heart sounds and intact distal pulses.  Pulmonary/Chest: Effort normal. He has decreased breath sounds. He has rhonchi.  Abdominal: Soft. Bowel sounds are normal.  Musculoskeletal: Normal range of motion.  Neurological: He is alert and oriented to person, place, and time.  Skin: Skin is warm and dry.  Jaundice.  Psychiatric: He has a normal mood and affect. His behavior is normal. Judgment and thought content normal.  Nursing note and vitals reviewed.   Results for orders placed or performed during the hospital encounter of 10/24/17  Hemoglobin-hemacue, POC  Result Value Ref Range   Hemoglobin 10.0 (L) 13.0 - 17.0 g/dL    Assessment and Plan:   Gerald Jacobs was seen today for cough.  Diagnoses and all orders for this visit:  Cough Comments: High risk, with new fever after cough for > 1 week. Will treat.  Orders: -     doxycycline (VIBRA-TABS) 100 MG tablet; Take 1 tablet (100 mg total) by mouth 2 (two) times daily. -     benzonatate (TESSALON) 200 MG capsule; Take 1 capsule (200 mg total) by mouth 2 (two) times daily as needed for cough.   . Reviewed expectations re: course of current medical issues. . Discussed self-management of symptoms. . Outlined signs and symptoms indicating need for more acute intervention. . Patient verbalized understanding and all questions were answered. Marland Kitchen Health Maintenance issues including appropriate healthy diet, exercise, and smoking avoidance were discussed with patient. . See orders for this visit as documented in the electronic medical record. . Patient received an After Visit Summary.  Briscoe Deutscher, DO [place] [place] [facility] 10/28/2017  Future Appointments  Date Time Provider Story  10/31/2017 10:30 AM [facility] INJECTION ROOM [facility] None  12/22/2017  9:00 AM Lance Sell, NP [facility]

## 2017-10-31 ENCOUNTER — Encounter (HOSPITAL_COMMUNITY)
Admission: RE | Admit: 2017-10-31 | Discharge: 2017-10-31 | Disposition: A | Discharge status: Home / Self Care | Payer: Medicare Other | Source: Ambulatory Visit | Attending: Nephrology | Admitting: Nephrology

## 2017-10-31 VITALS — BP 153/59 | HR 69 | Temp 97.8°F | Resp 20

## 2017-10-31 DIAGNOSIS — D631 Anemia in chronic kidney disease: Secondary | ICD-10-CM | POA: Insufficient documentation

## 2017-10-31 DIAGNOSIS — N183 Chronic kidney disease, stage 3 (moderate): Secondary | ICD-10-CM | POA: Diagnosis not present

## 2017-10-31 DIAGNOSIS — D649 Anemia, unspecified: Secondary | ICD-10-CM

## 2017-10-31 LAB — IRON AND TIBC
Iron: 35 ug/dL — ABNORMAL LOW (ref 45–182)
SATURATION RATIOS: 11 % — AB (ref 17.9–39.5)
TIBC: 311 ug/dL (ref 250–450)
UIBC: 276 ug/dL

## 2017-10-31 LAB — POCT HEMOGLOBIN-HEMACUE: HEMOGLOBIN: 11 g/dL — AB (ref 13.0–17.0)

## 2017-10-31 LAB — FERRITIN: FERRITIN: 37 ng/mL (ref 24–336)

## 2017-10-31 MED ORDER — EPOETIN ALFA 10000 UNIT/ML IJ SOLN
10000.0000 [IU] | INTRAMUSCULAR | Status: DC
  Administered 2017-10-31: 10000 [IU] via SUBCUTANEOUS

## 2017-10-31 MED ORDER — EPOETIN ALFA 10000 UNIT/ML IJ SOLN
INTRAMUSCULAR | Status: AC
  Administered 2017-10-31: 10000 [IU] via SUBCUTANEOUS
  Filled 2017-10-31: qty 1

## 2017-11-03 ENCOUNTER — Other Ambulatory Visit (HOSPITAL_COMMUNITY): Payer: Self-pay | Admitting: *Deleted

## 2017-11-07 ENCOUNTER — Inpatient Hospital Stay (HOSPITAL_COMMUNITY): Admission: RE | Admit: 2017-11-07 | Payer: Medicare Other | Source: Ambulatory Visit

## 2017-11-09 ENCOUNTER — Ambulatory Visit (HOSPITAL_COMMUNITY)
Admission: RE | Admit: 2017-11-09 | Discharge: 2017-11-09 | Disposition: A | Discharge status: Home / Self Care | Payer: Medicare Other | Source: Ambulatory Visit | Attending: Nephrology | Admitting: Nephrology

## 2017-11-09 VITALS — BP 143/50 | HR 60 | Temp 98.6°F | Resp 20 | Ht 68.0 in | Wt 141.0 lb

## 2017-11-09 DIAGNOSIS — N189 Chronic kidney disease, unspecified: Secondary | ICD-10-CM | POA: Insufficient documentation

## 2017-11-09 DIAGNOSIS — D631 Anemia in chronic kidney disease: Secondary | ICD-10-CM | POA: Diagnosis present

## 2017-11-09 DIAGNOSIS — D649 Anemia, unspecified: Secondary | ICD-10-CM

## 2017-11-09 LAB — POCT HEMOGLOBIN-HEMACUE: HEMOGLOBIN: 11.3 g/dL — AB (ref 13.0–17.0)

## 2017-11-09 MED ORDER — EPOETIN ALFA 10000 UNIT/ML IJ SOLN
10000.0000 [IU] | INTRAMUSCULAR | Status: DC
  Administered 2017-11-09: 09:00:00 10000 [IU] via SUBCUTANEOUS

## 2017-11-09 MED ORDER — SODIUM CHLORIDE 0.9 % IV SOLN
510.0000 mg | INTRAVENOUS | Status: DC
  Administered 2017-11-09: 510 mg via INTRAVENOUS
  Filled 2017-11-09: qty 17

## 2017-11-09 MED ORDER — EPOETIN ALFA 10000 UNIT/ML IJ SOLN
INTRAMUSCULAR | Status: AC
  Filled 2017-11-09: qty 1

## 2017-11-12 ENCOUNTER — Other Ambulatory Visit: Payer: Self-pay | Admitting: Family

## 2017-11-16 ENCOUNTER — Ambulatory Visit: Payer: Self-pay

## 2017-11-16 ENCOUNTER — Encounter (HOSPITAL_COMMUNITY)
Admission: RE | Admit: 2017-11-16 | Discharge: 2017-11-16 | Disposition: A | Discharge status: Home / Self Care | Payer: Medicare Other | Source: Ambulatory Visit | Attending: Nephrology | Admitting: Nephrology

## 2017-11-16 VITALS — BP 133/49 | HR 60 | Temp 98.6°F | Resp 20 | Ht 67.0 in | Wt 140.0 lb

## 2017-11-16 DIAGNOSIS — N183 Chronic kidney disease, stage 3 (moderate): Secondary | ICD-10-CM | POA: Diagnosis not present

## 2017-11-16 DIAGNOSIS — D649 Anemia, unspecified: Secondary | ICD-10-CM

## 2017-11-16 LAB — POCT HEMOGLOBIN-HEMACUE: Hemoglobin: 11.1 g/dL — ABNORMAL LOW (ref 13.0–17.0)

## 2017-11-16 MED ORDER — EPOETIN ALFA 10000 UNIT/ML IJ SOLN
10000.0000 [IU] | INTRAMUSCULAR | Status: DC
Start: 2017-11-16 — End: 2017-11-17
  Administered 2017-11-16: 10000 [IU] via SUBCUTANEOUS

## 2017-11-16 MED ORDER — EPOETIN ALFA 10000 UNIT/ML IJ SOLN
INTRAMUSCULAR | Status: AC
  Administered 2017-11-16: 10000 [IU] via SUBCUTANEOUS
  Filled 2017-11-16: qty 1

## 2017-11-16 MED ORDER — SODIUM CHLORIDE 0.9 % IV SOLN
510.0000 mg | Freq: Once | INTRAVENOUS | Status: AC
  Administered 2017-11-16: 510 mg via INTRAVENOUS
  Filled 2017-11-16: qty 17

## 2017-11-16 NOTE — Telephone Encounter (Signed)
Phone call from pt.  Reported he fell down 3 cement steps about 2 weeks ago.  Reported he hit his head and his left upper back, beneath the shoulder blade.  Reported for approx. 10 days, he felt a sharp pain in the back, beneath the shoulder blade, with a deep breath.  Reported the sharp pain has subsided.  Also reported a metal coat hanger punctured his right hand; stated the puncture wound has healed without any complications.  Today, questions if he needs to have a chest xray, since he continues to feel a "crampy and achy pain" in left back, below rib cage.  Reported he notices the pain more with standing and moving about.  If he lays down, the discomfort eases up.  Per protocol, appt. scheduled 12/21 with PCP.  Care advice given per protocol; verb. Understanding; agrees with plan.            Reason for Disposition . [1] High-risk adult (e.g., age > 69, osteoporosis, chronic steroid use) AND [2] still hurts  Answer Assessment - Initial Assessment Questions 1. MECHANISM: "How did the injury happen?"    Fell down cement steps   2. ONSET: "When did the injury happen?" (Minutes or hours ago)      [place] 2 weeks ago  3. LOCATION: "Where on the chest is the injury located?"      hit the head and back, beneath left shoulder 4. APPEARANCE: "What does the injury look like?"     No swelling, or bruising at this time.   5. BLEEDING: "Is there any bleeding now? If so, ask: How long has it been bleeding?"    Right hand puncture injury from coat hanger.  6. SEVERITY: "Any difficulty with breathing?"     C/o pain upon deep breath for approx. 10 days.; now discomfort less than it had been  7. SIZE: For cuts, bruises, or swelling, ask: "How large is it?" (e.g., inches or centimeters)     Right hand healed from coat hanger puncture wound 8. PAIN: "Is there pain?" If so, ask: "How bad is the pain?"   (e.g., Scale 1-10; or mild, moderate, severe)     C/o moderate crampy/ achy feeling just beneath left shoulder  blade. 9. TETANUS: For any breaks in the skin, ask: "When was the last tetanus booster?"     He thinks he had one in last 10 yrs.  10. PREGNANCY: "Is there any chance you are pregnant?" "When was your last menstrual period?"       n/a  Protocols used: CHEST INJURY-A-AH

## 2017-11-17 ENCOUNTER — Ambulatory Visit (INDEPENDENT_AMBULATORY_CARE_PROVIDER_SITE_OTHER)
Admission: RE | Admit: 2017-11-17 | Discharge: 2017-11-17 | Disposition: A | Discharge status: Home / Self Care | Payer: Medicare Other | Source: Ambulatory Visit | Attending: Nurse Practitioner | Admitting: Nurse Practitioner

## 2017-11-17 ENCOUNTER — Ambulatory Visit (INDEPENDENT_AMBULATORY_CARE_PROVIDER_SITE_OTHER): Payer: Medicare Other | Admitting: Nurse Practitioner

## 2017-11-17 ENCOUNTER — Encounter: Payer: Self-pay | Admitting: Nurse Practitioner

## 2017-11-17 VITALS — BP 144/60 | HR 67 | Temp 98.8°F | Resp 16 | Ht 67.0 in | Wt 139.0 lb

## 2017-11-17 DIAGNOSIS — R0781 Pleurodynia: Secondary | ICD-10-CM | POA: Diagnosis not present

## 2017-11-17 DIAGNOSIS — R059 Cough, unspecified: Secondary | ICD-10-CM

## 2017-11-17 DIAGNOSIS — R05 Cough: Secondary | ICD-10-CM

## 2017-11-17 NOTE — Progress Notes (Signed)
Subjective:    Patient ID: Gerald Jacobs, male    DOB: 06/26/1925, 83 y.o.   MRN: [id number]  HPI  Gerald Jacobs is a 83 yo male who presents today for an acute visit. He has chief complaints of fall and cough.  Rib pain- This is a new problem. The pain began after he fell down concrete steps outside about 2 weeks ago, from the 3rd step to the ground. He says his knee gave out, causing him to fall. He did hit his head on the concrete but did not lose consciousness. He was able to get himself up after the fall. He has noticed since the day after the fall hes had left mid back pain. The pain hurts to breathe. The pain is improving. He denies headaches, numbness or tingling, vision changes, chest pain, shortness of breaht, hemoptysis, bruising. He reports some dizziness and shortness of breath which are normal for him, even prior to fall.  Cough- This is a new problem. This problem began about 3-4 days ago. The cough is productive. He tried some leftover codeine liquid with some relief. He denies fevers, malaise, weakness.  Review of Systems  See HPI  Past Medical History:  Diagnosis Date  . Bilateral shoulder pain 07/28/2016  . Cancer [facility])    prostate  . Chest pain 10/27/2016  . Chronic anemia    With normal iron studies and normal serum protein electrophoresis  . Coronary artery disease    Mild  . Cough 10/27/2016  . Diabetes mellitus    Adult onset  . Essential hypertension   . GERD (gastroesophageal reflux disease)    occ tums  . History of gout   . Hyperbilirubinemia 06/15/2017  . Hyponatremia 06/15/2017  . Intermittent palpitations 07/23/2014  . Liver mass   . Mass of bile duct 06/27/2017  . Mild renal insufficiency   . Nocturia    x2  . Normocytic anemia 06/23/2016  . Osteoarthritis   . Sciatica of right side 07/28/2016  . Statin myopathy 07/28/2016  . Weight loss 06/15/2017     Social History   Socioeconomic History  . Marital status: Married    Spouse  name: Not on file  . Number of children: 2  . Years of education: 9  . Highest education level: Not on file  Social Needs  . Financial resource strain: Not on file  . Food insecurity - worry: Not on file  . Food insecurity - inability: Not on file  . Transportation needs - medical: Not on file  . Transportation needs - non-medical: Not on file  Occupational History  . Not on file  Tobacco Use  . Smoking status: Former Smoker    Packs/day: 1.00    Years: 45.00    Pack years: 45.00    Types: Cigarettes    Last attempt to quit: 09/07/1979    Years since quitting: 38.2  . Smokeless tobacco: Never Used  Substance and Sexual Activity  . Alcohol use: Yes    Comment: beer rarely  . Drug use: No  . Sexual activity: Not on file  Other Topics Concern  . Not on file  Social History Narrative   Fun/Hobby: Playing golf when he can.     Past Surgical History:  Procedure Laterality Date  . CHOLECYSTECTOMY  02/04/2014   DR Zella Richer  . CHOLECYSTECTOMY N/A 02/04/2014   Procedure: LAPAROSCOPIC CHOLECYSTECTOMY with intraoperative cholangiogram;  Surgeon: Odis Hollingshead, MD;  Location: [facility];  Service: General;  Laterality: N/A;  . ERCP N/A 10/15/2013   Procedure: ENDOSCOPIC RETROGRADE CHOLANGIOPANCREATOGRAPHY (ERCP);  Surgeon: Jeryl Columbia, MD;  Location: [facility];  Service: Endoscopy;  Laterality: N/A;  . KNEE SURGERY Right 1979  . melanoma surgery  1970's   on scalp  . PROSTATE SURGERY  1993   Prostate Cancer  . SPHINCTEROTOMY  10/15/2013   Procedure: SPHINCTEROTOMY;  Surgeon: Jeryl Columbia, MD;  Location: [facility];  Service: Endoscopy;;    Family History  Problem Relation Age of Onset  . Stroke Father   . Stroke Mother   . Stroke Sister   . Stroke Brother   . Stroke Brother   . Stroke Brother     Allergies  Allergen Reactions  . Colchicine Other (See Comments)    GI problems  . Flexeril [Cyclobenzaprine Hcl] Other (See Comments)    GI problems   . Levaquin  [Levofloxacin In D5w] Other (See Comments)    Insomnia   . Percocet [Oxycodone-Acetaminophen] Nausea And Vomiting  . Zebeta Other (See Comments)    Gi problems    Current Outpatient Medications on File Prior to Visit  Medication Sig Dispense Refill  . allopurinol (ZYLOPRIM) 100 MG tablet TAKE 1 TABLET (100 MG TOTAL) BY MOUTH DAILY. 90 tablet 1  . aspirin 81 MG tablet Take 81 mg by mouth daily.      . Biotin 1000 MCG tablet Take 1,000 mcg by mouth daily.    . feeding supplement, GLUCERNA SHAKE, (GLUCERNA SHAKE) LIQD Take 237 mLs by mouth 2 (two) times daily between meals. 237 mL 0  . losartan (COZAAR) 50 MG tablet Take 1 tablet (50 mg total) daily by mouth. 90 tablet 1  . metoprolol succinate (TOPROL-XL) 50 MG 24 hr tablet Take 1 tablet (50 mg total) by mouth every morning. 90 tablet 1  . ONE TOUCH ULTRA TEST test strip USE ONE STRIP PER TEST. CHECK BLOOD SUGARS 1-4 TIMES PER DAY AS INSTRUCTED. 100 each 4  . ONETOUCH DELICA LANCETS 64P MISC TEST BLOOD SUGARS 1-4 TIMES A DAY AS INSTRUCTED. DX CODE: E11.9 100 each 2  . traZODone (DESYREL) 50 MG tablet Take 0.5-1 tablets (25-50 mg total) by mouth at bedtime as needed for sleep. 30 tablet 3  . amLODipine (NORVASC) 2.5 MG tablet Take 1 tablet (2.5 mg total) by mouth daily. (Patient taking differently: Take 2.5 mg by mouth at bedtime. ) 30 tablet 11  . saxagliptin HCl (ONGLYZA) 2.5 MG TABS tablet Take 2.5 mg by mouth daily.     No current facility-administered medications on file prior to visit.     BP (!) 144/60 (BP Location: Left Arm, Patient Position: Sitting, Cuff Size: Normal)   Pulse 67   Temp 98.8 F (37.1 C) (Oral)   Resp 16   Ht 5\' 7"  (1.702 m)   Wt 139 lb (63 kg)   SpO2 97%   BMI 21.77 kg/m        Objective:   Physical Exam  Constitutional: He is oriented to person, place, and time. He appears well-developed and well-nourished. No distress.  HENT:  Head: Normocephalic and atraumatic.  Cardiovascular: Normal rate,  regular rhythm, normal heart sounds and intact distal pulses.  Pulmonary/Chest: Effort normal and breath sounds normal.  Musculoskeletal:       Thoracic back: He exhibits pain. He exhibits normal range of motion, no tenderness, no swelling, no deformity and no spasm.       Back:  Neurological: He is alert and oriented to  person, place, and time. Coordination normal.  Skin: Skin is warm and dry. No bruising and no ecchymosis noted.  Psychiatric: He has a normal mood and affect. Judgment and thought content normal.       Assessment & Plan:  Cough Denies fevers, malaise, alarming symptoms. Would like to r/o infection or other abnormality based on age. Diagnostic testing ordered: - DG Chest 2 View; Future Return precautions given.  Rib pain on left side Post fall. Improving but persistent. He would like to have an xray to r/o rib fracture. Diagnostic testing ordered: - DG Ribs Unilateral Left; Future

## 2017-11-17 NOTE — Patient Instructions (Signed)
Please head downstairs for /x-rays. We will x-ray your chest and left rib cage to look for infection or broken bones.  For your cough, you may try delsym over the counter.  Please follow up if you develop fevers, begin to not feel well, or if your cough has not improved by next week.  It was good to see you. Thanks for letting me take care of you today :)

## 2017-11-21 ENCOUNTER — Encounter: Payer: Self-pay | Admitting: Nurse Practitioner

## 2017-11-22 ENCOUNTER — Ambulatory Visit: Payer: Medicare Other | Admitting: Nurse Practitioner

## 2017-11-22 ENCOUNTER — Encounter: Payer: Self-pay | Admitting: Nurse Practitioner

## 2017-11-22 ENCOUNTER — Ambulatory Visit (INDEPENDENT_AMBULATORY_CARE_PROVIDER_SITE_OTHER): Payer: Medicare Other | Admitting: Nurse Practitioner

## 2017-11-22 VITALS — BP 110/60 | HR 72 | Temp 98.4°F | Ht 67.0 in | Wt 138.0 lb

## 2017-11-22 DIAGNOSIS — J209 Acute bronchitis, unspecified: Secondary | ICD-10-CM

## 2017-11-22 MED ORDER — AZITHROMYCIN 250 MG PO TABS: ORAL_TABLET | ORAL | 0 refills | Status: DC

## 2017-11-22 MED ORDER — HYDROCODONE-HOMATROPINE 5-1.5 MG/5ML PO SYRP: 5.0000 mL | ORAL_SOLUTION | Freq: Three times a day (TID) | ORAL | 0 refills | Status: DC | PRN

## 2017-11-22 NOTE — Patient Instructions (Signed)
I have sent a z-pak to your pharmacy to treat for possible infection.  I have also sent hycodan syrup for your cough, this should help you get some sleep at night. Please be aware that this medication can make you drowsy. Please to not drink alcohol or operate machinery when you take this medication.  Please follow up if your cough does not improve or if you start feeling worse.  It was good to see you. Thanks for letting me take care of you today :)   Acute Bronchitis, Adult Acute bronchitis is when air tubes (bronchi) in the lungs suddenly get swollen. The condition can make it hard to breathe. It can also cause these symptoms:  A cough.  Coughing up clear, yellow, or green mucus.  Wheezing.  Chest congestion.  Shortness of breath.  A fever.  Body aches.  Chills.  A sore throat.  Follow these instructions at home: Medicines  Take over-the-counter and prescription medicines only as told by your doctor.  If you were prescribed an antibiotic medicine, take it as told by your doctor. Do not stop taking the antibiotic even if you start to feel better. General instructions  Rest.  Drink enough fluids to keep your pee (urine) clear or pale yellow.  Avoid smoking and secondhand smoke. If you smoke and you need help quitting, ask your doctor. Quitting will help your lungs heal faster.  Use an inhaler, cool mist vaporizer, or humidifier as told by your doctor.  Keep all follow-up visits as told by your doctor. This is important. How is this prevented? To lower your risk of getting this condition again:  Wash your hands often with soap and water. If you cannot use soap and water, use hand sanitizer.  Avoid contact with people who have cold symptoms.  Try not to touch your hands to your mouth, nose, or eyes.  Make sure to get the flu shot every year.  Contact a doctor if:  Your symptoms do not get better in 2 weeks. Get help right away if:  You cough up  blood.  You have chest pain.  You have very bad shortness of breath.  You become dehydrated.  You faint (pass out) or keep feeling like you are going to pass out.  You keep throwing up (vomiting).  You have a very bad headache.  Your fever or chills gets worse. This information is not intended to replace advice given to you by your health care provider. Make sure you discuss any questions you have with your health care provider. Document Released: 05/02/2008 Document Revised: 06/22/2016 Document Reviewed: 05/04/2016 Elsevier Interactive Patient Education  [employer].

## 2017-11-22 NOTE — Progress Notes (Signed)
Subjective:    Patient ID: Gerald Jacobs, male    DOB: 1925/10/20, 83 y.o.   MRN: [id number]  HPI Gerald Jacobs is a 83 yo male who presents today for a chief complaint of cough. He was seen in the clinic this past Friday 12/21 for cough and fall. He fell about 2-3 weeks ago and had been experiencing left rib pain since. He also began coughing around last Monday. A chest xray and left rib xray were ordered on this past Friday 12/21 showing possible left 8th rib fracture without dislocation, and no other abnormalities.  He has continued to cough since Friday now with some productive white to green mucous. He feels like the cough has now moved from his throat to his chest. Last night he felt like he heard himself wheezing. He can not sleep due to the cough keeping him awake at night. He reports malaise, chest congestion, nausea.  He denies fevers, nasal congestion, postnasal drip, chest pain, heartburn, reflux, vomiting. He has been nauseated Tried mucinex ,flonase, claritin with no relief.  Review of Systems See HPI  Past Medical History:  Diagnosis Date  . Bilateral shoulder pain 07/28/2016  . Cancer [facility])    prostate  . Chest pain 10/27/2016  . Chronic anemia    With normal iron studies and normal serum protein electrophoresis  . Coronary artery disease    Mild  . Cough 10/27/2016  . Diabetes mellitus    Adult onset  . Essential hypertension   . GERD (gastroesophageal reflux disease)    occ tums  . History of gout   . Hyperbilirubinemia 06/15/2017  . Hyponatremia 06/15/2017  . Intermittent palpitations 07/23/2014  . Liver mass   . Mass of bile duct 06/27/2017  . Mild renal insufficiency   . Nocturia    x2  . Normocytic anemia 06/23/2016  . Osteoarthritis   . Sciatica of right side 07/28/2016  . Statin myopathy 07/28/2016  . Weight loss 06/15/2017     Social History   Socioeconomic History  . Marital status: Married    Spouse name: Not on file  . Number of children: 2    . Years of education: 27  . Highest education level: Not on file  Social Needs  . Financial resource strain: Not on file  . Food insecurity - worry: Not on file  . Food insecurity - inability: Not on file  . Transportation needs - medical: Not on file  . Transportation needs - non-medical: Not on file  Occupational History  . Not on file  Tobacco Use  . Smoking status: Former Smoker    Packs/day: 1.00    Years: 45.00    Pack years: 45.00    Types: Cigarettes    Last attempt to quit: 09/07/1979    Years since quitting: 38.2  . Smokeless tobacco: Never Used  Substance and Sexual Activity  . Alcohol use: Yes    Comment: beer rarely  . Drug use: No  . Sexual activity: Not on file  Other Topics Concern  . Not on file  Social History Narrative   Fun/Hobby: Playing golf when he can.     Past Surgical History:  Procedure Laterality Date  . CHOLECYSTECTOMY  02/04/2014   DR Zella Richer  . CHOLECYSTECTOMY N/A 02/04/2014   Procedure: LAPAROSCOPIC CHOLECYSTECTOMY with intraoperative cholangiogram;  Surgeon: Odis Hollingshead, MD;  Location: [place];  Service: General;  Laterality: N/A;  . ERCP N/A 10/15/2013   Procedure: ENDOSCOPIC RETROGRADE CHOLANGIOPANCREATOGRAPHY (ERCP);  Surgeon: Jeryl Columbia, MD;  Location: [facility];  Service: Endoscopy;  Laterality: N/A;  . KNEE SURGERY Right 1979  . melanoma surgery  1970's   on scalp  . PROSTATE SURGERY  1993   Prostate Cancer  . SPHINCTEROTOMY  10/15/2013   Procedure: SPHINCTEROTOMY;  Surgeon: Jeryl Columbia, MD;  Location: [facility];  Service: Endoscopy;;    Family History  Problem Relation Age of Onset  . Stroke Father   . Stroke Mother   . Stroke Sister   . Stroke Brother   . Stroke Brother   . Stroke Brother     Allergies  Allergen Reactions  . Colchicine Other (See Comments)    GI problems  . Flexeril [Cyclobenzaprine Hcl] Other (See Comments)    GI problems   . Levaquin [Levofloxacin In D5w] Other (See Comments)     Insomnia   . Percocet [Oxycodone-Acetaminophen] Nausea And Vomiting  . Zebeta Other (See Comments)    Gi problems    Current Outpatient Medications on File Prior to Visit  Medication Sig Dispense Refill  . allopurinol (ZYLOPRIM) 100 MG tablet TAKE 1 TABLET (100 MG TOTAL) BY MOUTH DAILY. 90 tablet 1  . aspirin 81 MG tablet Take 81 mg by mouth daily.      . Biotin 1000 MCG tablet Take 1,000 mcg by mouth daily.    . feeding supplement, GLUCERNA SHAKE, (GLUCERNA SHAKE) LIQD Take 237 mLs by mouth 2 (two) times daily between meals. 237 mL 0  . losartan (COZAAR) 50 MG tablet Take 1 tablet (50 mg total) daily by mouth. 90 tablet 1  . metoprolol succinate (TOPROL-XL) 50 MG 24 hr tablet Take 1 tablet (50 mg total) by mouth every morning. 90 tablet 1  . ONE TOUCH ULTRA TEST test strip USE ONE STRIP PER TEST. CHECK BLOOD SUGARS 1-4 TIMES PER DAY AS INSTRUCTED. 100 each 4  . ONETOUCH DELICA LANCETS 53I MISC TEST BLOOD SUGARS 1-4 TIMES A DAY AS INSTRUCTED. DX CODE: E11.9 100 each 2  . saxagliptin HCl (ONGLYZA) 2.5 MG TABS tablet Take 2.5 mg by mouth daily.    . traZODone (DESYREL) 50 MG tablet Take 0.5-1 tablets (25-50 mg total) by mouth at bedtime as needed for sleep. 30 tablet 3  . amLODipine (NORVASC) 2.5 MG tablet Take 1 tablet (2.5 mg total) by mouth daily. (Patient taking differently: Take 2.5 mg by mouth at bedtime. ) 30 tablet 11  . benzonatate (TESSALON) 200 MG capsule TAKE 1 CAPSULE (200 MG TOTAL) BY MOUTH 2 (TWO) TIMES DAILY AS NEEDED FOR COUGH.  0   No current facility-administered medications on file prior to visit.     BP 110/60   Pulse 72   Temp 98.4 F (36.9 C) (Oral)   Ht 5\' 7"  (1.702 m)   Wt 138 lb (62.6 kg)   SpO2 98%   BMI 21.61 kg/m       Objective:   Physical Exam  Constitutional: He is oriented to person, place, and time. He appears well-developed and well-nourished. No distress.  HENT:  Head: Normocephalic and atraumatic.  Right Ear: Tympanic membrane, external  ear and ear canal normal.  Left Ear: Tympanic membrane, external ear and ear canal normal.  Mouth/Throat: Uvula is midline, oropharynx is clear and moist and mucous membranes are normal.  Eyes: Conjunctivae and lids are normal.  Cardiovascular: Normal rate, regular rhythm, normal heart sounds and intact distal pulses.  Pulmonary/Chest: Effort normal and breath sounds normal.  Neurological: He is alert  and oriented to person, place, and time. Coordination normal.  Skin: Skin is warm and dry.  Psychiatric: He has a normal mood and affect. Judgment and thought content normal.      Assessment & Plan:   Acute bronchitis, unspecified organism Cough is worse since last week, now on about 8th day of cough. Productive green sputum, wheezing, malaise reported today. Will treat for acute bronchitis. Cough medication given for bedtime, side effects discussed. Medications ordered: - HYDROcodone-homatropine (HYCODAN) 5-1.5 MG/5ML syrup; Take 5 mLs by mouth every 8 (eight) hours as needed for cough.  Dispense: 120 mL; Refill: 0 - azithromycin (ZITHROMAX) 250 MG tablet; Take 2 tablets today, then 1 tablet every day until complete  Dispense: 6 tablet; Refill: 0 Return precautions given. Pt education material given.

## 2017-11-22 NOTE — Telephone Encounter (Signed)
Called pt concerning mychart msg he states no the cough is not better at all. The cough is deep, and not able to cough up any phlegm. Denies fever or sob sxs. He also stated that he never heard results back from the chest xray that was done on last Friday. Inform pt will send msg to [place], but also make him an f/u appt this am @ 10:45 for re-evaluation...Gerald Jacobs

## 2017-11-24 ENCOUNTER — Encounter (HOSPITAL_COMMUNITY)
Admission: RE | Admit: 2017-11-24 | Discharge: 2017-11-24 | Disposition: A | Discharge status: Home / Self Care | Payer: Medicare Other | Source: Ambulatory Visit | Attending: Nephrology | Admitting: Nephrology

## 2017-11-24 VITALS — BP 145/57 | HR 64 | Resp 18

## 2017-11-24 DIAGNOSIS — D649 Anemia, unspecified: Secondary | ICD-10-CM

## 2017-11-24 DIAGNOSIS — N183 Chronic kidney disease, stage 3 (moderate): Secondary | ICD-10-CM | POA: Diagnosis not present

## 2017-11-24 LAB — POCT HEMOGLOBIN-HEMACUE: HEMOGLOBIN: 11.6 g/dL — AB (ref 13.0–17.0)

## 2017-11-24 MED ORDER — EPOETIN ALFA 10000 UNIT/ML IJ SOLN
10000.0000 [IU] | INTRAMUSCULAR | Status: DC
  Administered 2017-11-24: 10000 [IU] via SUBCUTANEOUS

## 2017-11-24 MED ORDER — EPOETIN ALFA 10000 UNIT/ML IJ SOLN
INTRAMUSCULAR | Status: AC
  Administered 2017-11-24: 10000 [IU] via SUBCUTANEOUS
  Filled 2017-11-24: qty 1

## 2017-12-01 ENCOUNTER — Encounter (HOSPITAL_COMMUNITY)
Admission: RE | Admit: 2017-12-01 | Discharge: 2017-12-01 | Disposition: A | Discharge status: Home / Self Care | Payer: Medicare Other | Source: Ambulatory Visit | Attending: Nephrology | Admitting: Nephrology

## 2017-12-01 VITALS — BP 132/58 | HR 55 | Temp 97.9°F | Resp 20

## 2017-12-01 DIAGNOSIS — N183 Chronic kidney disease, stage 3 (moderate): Secondary | ICD-10-CM | POA: Diagnosis not present

## 2017-12-01 DIAGNOSIS — D631 Anemia in chronic kidney disease: Secondary | ICD-10-CM | POA: Diagnosis present

## 2017-12-01 DIAGNOSIS — D649 Anemia, unspecified: Secondary | ICD-10-CM

## 2017-12-01 LAB — IRON AND TIBC
IRON: 45 ug/dL (ref 45–182)
Saturation Ratios: 19 % (ref 17.9–39.5)
TIBC: 234 ug/dL — ABNORMAL LOW (ref 250–450)
UIBC: 189 ug/dL

## 2017-12-01 LAB — POCT HEMOGLOBIN-HEMACUE: Hemoglobin: 12 g/dL — ABNORMAL LOW (ref 13.0–17.0)

## 2017-12-01 LAB — FERRITIN: Ferritin: 339 ng/mL — ABNORMAL HIGH (ref 24–336)

## 2017-12-01 MED ORDER — EPOETIN ALFA 10000 UNIT/ML IJ SOLN: 10000.0000 [IU] | INTRAMUSCULAR | Status: DC

## 2017-12-01 MED ORDER — CLONIDINE HCL 0.1 MG PO TABS: 0.1000 mg | ORAL_TABLET | Freq: Once | ORAL | Status: DC | PRN

## 2017-12-15 ENCOUNTER — Encounter (HOSPITAL_COMMUNITY)
Admission: RE | Admit: 2017-12-15 | Discharge: 2017-12-15 | Disposition: A | Discharge status: Home / Self Care | Payer: Medicare Other | Source: Ambulatory Visit | Attending: Nephrology | Admitting: Nephrology

## 2017-12-15 VITALS — BP 138/56 | HR 56 | Temp 97.7°F | Resp 20

## 2017-12-15 DIAGNOSIS — N183 Chronic kidney disease, stage 3 (moderate): Secondary | ICD-10-CM | POA: Diagnosis not present

## 2017-12-15 DIAGNOSIS — D649 Anemia, unspecified: Secondary | ICD-10-CM

## 2017-12-15 LAB — POCT HEMOGLOBIN-HEMACUE: HEMOGLOBIN: 10.9 g/dL — AB (ref 13.0–17.0)

## 2017-12-15 MED ORDER — EPOETIN ALFA 10000 UNIT/ML IJ SOLN
INTRAMUSCULAR | Status: AC
  Filled 2017-12-15: qty 1

## 2017-12-15 MED ORDER — EPOETIN ALFA 10000 UNIT/ML IJ SOLN
10000.0000 [IU] | INTRAMUSCULAR | Status: DC
  Administered 2017-12-15: 09:00:00 10000 [IU] via SUBCUTANEOUS

## 2017-12-22 ENCOUNTER — Encounter: Payer: Self-pay | Admitting: Nurse Practitioner

## 2017-12-22 ENCOUNTER — Encounter (HOSPITAL_COMMUNITY)
Admission: RE | Admit: 2017-12-22 | Discharge: 2017-12-22 | Disposition: A | Discharge status: Home / Self Care | Payer: Medicare Other | Source: Ambulatory Visit | Attending: Nephrology | Admitting: Nephrology

## 2017-12-22 ENCOUNTER — Ambulatory Visit: Payer: Medicare Other | Admitting: Nurse Practitioner

## 2017-12-22 VITALS — BP 151/61 | HR 53 | Temp 97.5°F | Resp 18

## 2017-12-22 VITALS — BP 168/70 | HR 75 | Temp 98.2°F | Resp 16 | Ht 67.0 in | Wt 138.0 lb

## 2017-12-22 DIAGNOSIS — E1122 Type 2 diabetes mellitus with diabetic chronic kidney disease: Secondary | ICD-10-CM | POA: Diagnosis not present

## 2017-12-22 DIAGNOSIS — N183 Chronic kidney disease, stage 3 (moderate): Secondary | ICD-10-CM | POA: Diagnosis not present

## 2017-12-22 DIAGNOSIS — I10 Essential (primary) hypertension: Secondary | ICD-10-CM

## 2017-12-22 DIAGNOSIS — N184 Chronic kidney disease, stage 4 (severe): Secondary | ICD-10-CM | POA: Diagnosis not present

## 2017-12-22 DIAGNOSIS — D649 Anemia, unspecified: Secondary | ICD-10-CM

## 2017-12-22 LAB — POCT HEMOGLOBIN-HEMACUE: Hemoglobin: 12.7 g/dL — ABNORMAL LOW (ref 13.0–17.0)

## 2017-12-22 MED ORDER — EPOETIN ALFA 10000 UNIT/ML IJ SOLN: 10000.0000 [IU] | INTRAMUSCULAR | Status: DC

## 2017-12-22 NOTE — Assessment & Plan Note (Addendum)
Elevated reading today but reports normal home readings with sometimes low systolic numbers. We discussed goal to keep blood pressure below 140/90 consistently. He will continue to monitor BP at home for elevated readings and we will recheck at his 2 month follow up. No medication changes today- we could increase amlodipine to 5 daily if his blood pressure readings continue to be elevated.

## 2017-12-22 NOTE — Assessment & Plan Note (Signed)
He stopped his onglyza on his own about 1 month ago but has had consistent glucose readings between 135-150 at home. He does not want to restart onglyza so we discussed him continuing to monitor blood sugars at home and return in about 2 months for an A1c.  He will notify me for home readings over 200, under 70.

## 2017-12-22 NOTE — Progress Notes (Signed)
Name: Gerald Jacobs   MRN: [id number]    DOB: [birth year]   Date:12/22/2017       Progress Note  Subjective  Chief Complaint  Chief Complaint  Patient presents with  . Follow-up    HPI  Mr Gerald Jacobs returns today for routine follow up of his chronic conditions. 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 He is also followed by nephrology for stage 4 CKD and anemia.  Diabetes- he has been maintained on onglyza. He stopped taking it on his own about 1 month ago due to his A1cs and home blood sugars being normal.  Reports he checks his blood sugars daily:home blood sugar readings 135-150 Denies tremor, diaphoresis, polyuria, polydipsia, polyphagia.  Lab Results  Component Value Date   HGBA1C 5.7 09/21/2017    Hypertension -maintained on amlodipine 2.5, losartan 50, metoprolol 50mg . Reports daily medication compliance without adverse medication effects. Reports readings at home : 130-140/50s. Denies headaches, vision changes, chest pain. He does report some chronic Mild shortness of breath, edema.  BP Readings from Last 3 Encounters:  12/22/17 (!) 168/70  12/15/17 (!) 138/56  12/01/17 (!) 132/58    Patient Active Problem List   Diagnosis Date Noted  . Mass of bile duct 06/27/2017  . Liver mass   . Hyponatremia 06/15/2017  . Weight loss 06/15/2017  . Hyperbilirubinemia 06/15/2017  . Cough 10/27/2016  . Chest pain 10/27/2016  . Essential hypertension 09/02/2016  . Bilateral shoulder pain 07/28/2016  . Sciatica of right side 07/28/2016  . Statin myopathy 07/28/2016  . Normocytic anemia 06/23/2016  . Medicare annual wellness visit, subsequent 01/18/2016  . Routine general medical examination at a health care facility 01/18/2016  . Frequent PVCs 11/24/2014  . Intermittent palpitations 07/23/2014  . Chronic cholecystitis with calculus 02/04/2014  . Choledocholithiasis 08/28/2013  . Symptomatic cholelithiasis 06/04/2013  . Elevated LFTs 06/04/2013   . Benign hypertensive heart disease without heart failure 09/07/2011  . Type 2 diabetes mellitus with stage 4 chronic kidney disease, without long-term current use of insulin ([place]) 09/07/2011  . Dyslipidemia 09/07/2011  . CKD (chronic kidney disease), stage IV ([place]) 09/07/2011    Past Surgical History:  Procedure Laterality Date  . CHOLECYSTECTOMY  02/04/2014   DR Zella Richer  . CHOLECYSTECTOMY N/A 02/04/2014   Procedure: LAPAROSCOPIC CHOLECYSTECTOMY with intraoperative cholangiogram;  Surgeon: Odis Hollingshead, MD;  Location: [place];  Service: General;  Laterality: N/A;  . ERCP N/A 10/15/2013   Procedure: ENDOSCOPIC RETROGRADE CHOLANGIOPANCREATOGRAPHY (ERCP);  Surgeon: Jeryl Columbia, MD;  Location: [facility];  Service: Endoscopy;  Laterality: N/A;  . KNEE SURGERY Right 1979  . melanoma surgery  1970's   on scalp  . PROSTATE SURGERY  1993   Prostate Cancer  . SPHINCTEROTOMY  10/15/2013   Procedure: SPHINCTEROTOMY;  Surgeon: Jeryl Columbia, MD;  Location: [facility];  Service: Endoscopy;;    Family History  Problem Relation Age of Onset  . Stroke Father   . Stroke Mother   . Stroke Sister   . Stroke Brother   . Stroke Brother   . Stroke Brother     Social History   Socioeconomic History  . Marital status: Married    Spouse name: Not on file  . Number of children: 2  . Years of education: 54  . Highest education level: Not on file  Social Needs  . Financial resource strain: Not on file  . Food insecurity - worry: Not on file  . Food insecurity - inability:  Not on file  . Transportation needs - medical: Not on file  . Transportation needs - non-medical: Not on file  Occupational History  . Not on file  Tobacco Use  . Smoking status: Former Smoker    Packs/day: 1.00    Years: 45.00    Pack years: 45.00    Types: Cigarettes    Last attempt to quit: 09/07/1979    Years since quitting: 38.3  . Smokeless tobacco: Never Used  Substance and Sexual Activity  .  Alcohol use: Yes    Comment: beer rarely  . Drug use: No  . Sexual activity: Not on file  Other Topics Concern  . Not on file  Social History Narrative   Fun/Hobby: Playing golf when he can.      Current Outpatient Medications:  .  allopurinol (ZYLOPRIM) 100 MG tablet, TAKE 1 TABLET (100 MG TOTAL) BY MOUTH DAILY., Disp: 90 tablet, Rfl: 1 .  aspirin 81 MG tablet, Take 81 mg by mouth daily.  , Disp: , Rfl:  .  Biotin 1000 MCG tablet, Take 1,000 mcg by mouth daily., Disp: , Rfl:  .  feeding supplement, GLUCERNA SHAKE, (GLUCERNA SHAKE) LIQD, Take 237 mLs by mouth 2 (two) times daily between meals., Disp: 237 mL, Rfl: 0 .  losartan (COZAAR) 50 MG tablet, Take 1 tablet (50 mg total) daily by mouth., Disp: 90 tablet, Rfl: 1 .  metoprolol succinate (TOPROL-XL) 50 MG 24 hr tablet, Take 1 tablet (50 mg total) by mouth every morning., Disp: 90 tablet, Rfl: 1 .  ONE TOUCH ULTRA TEST test strip, USE ONE STRIP PER TEST. CHECK BLOOD SUGARS 1-4 TIMES PER DAY AS INSTRUCTED., Disp: 100 each, Rfl: 4 .  ONETOUCH DELICA LANCETS 93G MISC, TEST BLOOD SUGARS 1-4 TIMES A DAY AS INSTRUCTED. DX CODE: E11.9, Disp: 100 each, Rfl: 2 .  traZODone (DESYREL) 50 MG tablet, Take 0.5-1 tablets (25-50 mg total) by mouth at bedtime as needed for sleep., Disp: 30 tablet, Rfl: 3 .  amLODipine (NORVASC) 2.5 MG tablet, Take 1 tablet (2.5 mg total) by mouth daily. (Patient taking differently: Take 2.5 mg by mouth at bedtime. ), Disp: 30 tablet, Rfl: 11  Allergies  Allergen Reactions  . Colchicine Other (See Comments)    GI problems  . Flexeril [Cyclobenzaprine Hcl] Other (See Comments)    GI problems   . Levaquin [Levofloxacin In D5w] Other (See Comments)    Insomnia   . Percocet [Oxycodone-Acetaminophen] Nausea And Vomiting  . Zebeta Other (See Comments)    Gi problems     ROS See HPI  Objective  Vitals:   12/22/17 0910  BP: (!) 168/70  Pulse: 75  Resp: 16  Temp: 98.2 F (36.8 C)  TempSrc: Oral  SpO2: 95%   Weight: 138 lb (62.6 kg)  Height: 5\' 7"  (1.702 m)   Body mass index is 21.61 kg/m.  Physical Exam Constitutional: Patient appears well-developed and well-nourished. No distress.  HENT: Head: Normocephalic and atraumatic. Nose: Nose normal. Mouth/Throat: Oropharynx is clear and moist. No oropharyngeal exudate.  Eyes: Conjunctivae are normal. No scleral icterus.  Neck: Normal range of motion. Neck supple. Cardiovascular: Normal rate, regular rhythm and normal heart sounds. No BLE edema. Distal pulses intact. Pulmonary/Chest: Effort normal and breath sounds normal. No respiratory distress. Musculoskeletal: Normal range of motion, no joint effusions. No gross deformities Neurological: He is alert and oriented to person, place, and time.Coordination, balance, strength, speech and gait are normal.  Skin: Skin is warm and dry.  No rash noted. No erythema.  Psychiatric: Patient has a normal mood and affect. behavior is normal. Judgment and thought content normal.   PHQ2/9: Depression screen [facility] 2/9 01/24/2017 01/18/2016  Decreased Interest 0 1  Down, Depressed, Hopeless 0 1  PHQ - 2 Score 0 2  Altered sleeping - 1  Tired, decreased energy - 1  Change in appetite - 1  Feeling bad or failure about yourself  - 0  Trouble concentrating - 0  Moving slowly or fidgety/restless - 0  Suicidal thoughts - 0  PHQ-9 Score - 5  Difficult doing work/chores - Not difficult at all   Fall Risk: Fall Risk  01/24/2017  Falls in the past year? Yes  Number falls in past yr: 1  Injury with Fall? Yes   Assessment & Plan RTC in about 2 months for BP and diabetes follow up; repeat BMET and A1c.

## 2017-12-22 NOTE — Patient Instructions (Addendum)
I'd like to see you back in about 2 months. We can make sure your blood pressure is stable and recheck your A1c off the onglyza.  Let me know if you start to have blood sugar readings over 200 or under 70.  It was so nice to see you today. Thanks for letting me take care of you today :)

## 2018-01-05 ENCOUNTER — Encounter (HOSPITAL_COMMUNITY)
Admission: RE | Admit: 2018-01-05 | Discharge: 2018-01-05 | Disposition: A | Discharge status: Home / Self Care | Payer: Medicare Other | Source: Ambulatory Visit | Attending: Nephrology | Admitting: Nephrology

## 2018-01-05 VITALS — BP 133/51 | HR 66 | Resp 18

## 2018-01-05 DIAGNOSIS — D631 Anemia in chronic kidney disease: Secondary | ICD-10-CM | POA: Insufficient documentation

## 2018-01-05 DIAGNOSIS — N183 Chronic kidney disease, stage 3 (moderate): Secondary | ICD-10-CM | POA: Diagnosis present

## 2018-01-05 DIAGNOSIS — D649 Anemia, unspecified: Secondary | ICD-10-CM

## 2018-01-05 LAB — POCT HEMOGLOBIN-HEMACUE: Hemoglobin: 11.4 g/dL — ABNORMAL LOW (ref 13.0–17.0)

## 2018-01-05 LAB — IRON AND TIBC
Iron: 137 ug/dL (ref 45–182)
SATURATION RATIOS: 53 % — AB (ref 17.9–39.5)
TIBC: 260 ug/dL (ref 250–450)
UIBC: 123 ug/dL

## 2018-01-05 LAB — FERRITIN: Ferritin: 248 ng/mL (ref 24–336)

## 2018-01-05 MED ORDER — EPOETIN ALFA 10000 UNIT/ML IJ SOLN
10000.0000 [IU] | INTRAMUSCULAR | Status: DC
  Administered 2018-01-05: 10:00:00 10000 [IU] via SUBCUTANEOUS

## 2018-01-05 MED ORDER — EPOETIN ALFA 10000 UNIT/ML IJ SOLN
INTRAMUSCULAR | Status: AC
  Filled 2018-01-05: qty 1

## 2018-01-12 ENCOUNTER — Encounter (HOSPITAL_COMMUNITY)
Admission: RE | Admit: 2018-01-12 | Discharge: 2018-01-12 | Disposition: A | Discharge status: Home / Self Care | Payer: Medicare Other | Source: Ambulatory Visit | Attending: Nephrology | Admitting: Nephrology

## 2018-01-12 VITALS — BP 130/47 | HR 56 | Temp 97.7°F | Resp 20

## 2018-01-12 DIAGNOSIS — D649 Anemia, unspecified: Secondary | ICD-10-CM

## 2018-01-12 DIAGNOSIS — N183 Chronic kidney disease, stage 3 (moderate): Secondary | ICD-10-CM | POA: Diagnosis not present

## 2018-01-12 LAB — RENAL FUNCTION PANEL
ALBUMIN: 3.3 g/dL — AB (ref 3.5–5.0)
Anion gap: 11 (ref 5–15)
BUN: 38 mg/dL — AB (ref 6–20)
CALCIUM: 8.6 mg/dL — AB (ref 8.9–10.3)
CO2: 22 mmol/L (ref 22–32)
Chloride: 104 mmol/L (ref 101–111)
Creatinine, Ser: 2.61 mg/dL — ABNORMAL HIGH (ref 0.61–1.24)
GFR calc Af Amer: 23 mL/min — ABNORMAL LOW (ref >60)
GFR, EST NON AFRICAN AMERICAN: 20 mL/min — AB (ref >60)
Glucose, Bld: 259 mg/dL — ABNORMAL HIGH (ref 65–99)
PHOSPHORUS: 4.6 mg/dL (ref 2.5–4.6)
POTASSIUM: 5.1 mmol/L (ref 3.5–5.1)
Sodium: 137 mmol/L (ref 135–145)

## 2018-01-12 LAB — MAGNESIUM: MAGNESIUM: 1.9 mg/dL (ref 1.7–2.4)

## 2018-01-12 LAB — IRON AND TIBC
Iron: 61 ug/dL (ref 45–182)
Saturation Ratios: 21 % (ref 17.9–39.5)
TIBC: 287 ug/dL (ref 250–450)
UIBC: 226 ug/dL

## 2018-01-12 LAB — FERRITIN: Ferritin: 145 ng/mL (ref 24–336)

## 2018-01-12 LAB — POCT HEMOGLOBIN-HEMACUE: Hemoglobin: 12.4 g/dL — ABNORMAL LOW (ref 13.0–17.0)

## 2018-01-12 MED ORDER — EPOETIN ALFA 10000 UNIT/ML IJ SOLN: 10000.0000 [IU] | INTRAMUSCULAR | Status: DC

## 2018-01-12 MED ORDER — CLONIDINE HCL 0.1 MG PO TABS: 0.1000 mg | ORAL_TABLET | Freq: Once | ORAL | Status: DC | PRN

## 2018-01-16 ENCOUNTER — Other Ambulatory Visit: Payer: Self-pay | Admitting: Cardiovascular Disease

## 2018-01-16 LAB — VITAMIN D 25 HYDROXY (VIT D DEFICIENCY, FRACTURES): VIT D 25 HYDROXY: 26.7 ng/mL — AB (ref 30.0–100.0)

## 2018-01-22 ENCOUNTER — Ambulatory Visit: Payer: Medicare Other | Admitting: Nurse Practitioner

## 2018-01-25 ENCOUNTER — Encounter (HOSPITAL_COMMUNITY)
Admission: RE | Admit: 2018-01-25 | Discharge: 2018-01-25 | Disposition: A | Discharge status: Home / Self Care | Payer: Medicare Other | Source: Ambulatory Visit | Attending: Nephrology | Admitting: Nephrology

## 2018-01-25 VITALS — BP 145/66 | HR 63 | Temp 97.9°F | Resp 20

## 2018-01-25 DIAGNOSIS — D649 Anemia, unspecified: Secondary | ICD-10-CM

## 2018-01-25 DIAGNOSIS — N183 Chronic kidney disease, stage 3 (moderate): Secondary | ICD-10-CM | POA: Diagnosis not present

## 2018-01-25 LAB — POCT HEMOGLOBIN-HEMACUE: HEMOGLOBIN: 11.7 g/dL — AB (ref 13.0–17.0)

## 2018-01-25 MED ORDER — EPOETIN ALFA 10000 UNIT/ML IJ SOLN
10000.0000 [IU] | INTRAMUSCULAR | Status: DC
  Administered 2018-01-25: 10000 [IU] via SUBCUTANEOUS

## 2018-01-25 MED ORDER — EPOETIN ALFA 10000 UNIT/ML IJ SOLN
INTRAMUSCULAR | Status: AC
  Administered 2018-01-25: 09:00:00 10000 [IU] via SUBCUTANEOUS
  Filled 2018-01-25: qty 1

## 2018-02-01 ENCOUNTER — Encounter (HOSPITAL_COMMUNITY)
Admission: RE | Admit: 2018-02-01 | Discharge: 2018-02-01 | Disposition: A | Discharge status: Home / Self Care | Payer: Medicare Other | Source: Ambulatory Visit | Attending: Nephrology | Admitting: Nephrology

## 2018-02-01 VITALS — BP 144/53 | HR 58 | Temp 97.8°F | Resp 20

## 2018-02-01 DIAGNOSIS — D631 Anemia in chronic kidney disease: Secondary | ICD-10-CM | POA: Insufficient documentation

## 2018-02-01 DIAGNOSIS — D649 Anemia, unspecified: Secondary | ICD-10-CM

## 2018-02-01 DIAGNOSIS — N183 Chronic kidney disease, stage 3 (moderate): Secondary | ICD-10-CM | POA: Diagnosis not present

## 2018-02-01 LAB — POCT HEMOGLOBIN-HEMACUE: Hemoglobin: 11.1 g/dL — ABNORMAL LOW (ref 13.0–17.0)

## 2018-02-01 MED ORDER — EPOETIN ALFA 10000 UNIT/ML IJ SOLN
10000.0000 [IU] | INTRAMUSCULAR | Status: DC
Start: 2018-02-01 — End: 2018-02-02
  Administered 2018-02-01: 09:00:00 10000 [IU] via SUBCUTANEOUS

## 2018-02-01 MED ORDER — EPOETIN ALFA 10000 UNIT/ML IJ SOLN
INTRAMUSCULAR | Status: AC
  Filled 2018-02-01: qty 1

## 2018-02-08 ENCOUNTER — Ambulatory Visit (HOSPITAL_COMMUNITY)
Admission: RE | Admit: 2018-02-08 | Discharge: 2018-02-08 | Disposition: A | Discharge status: Home / Self Care | Payer: Medicare Other | Source: Ambulatory Visit | Attending: Nephrology | Admitting: Nephrology

## 2018-02-08 VITALS — BP 141/54 | HR 62 | Temp 98.2°F | Resp 20

## 2018-02-08 DIAGNOSIS — D631 Anemia in chronic kidney disease: Secondary | ICD-10-CM | POA: Diagnosis present

## 2018-02-08 DIAGNOSIS — N184 Chronic kidney disease, stage 4 (severe): Secondary | ICD-10-CM | POA: Diagnosis present

## 2018-02-08 DIAGNOSIS — D649 Anemia, unspecified: Secondary | ICD-10-CM

## 2018-02-08 LAB — POCT HEMOGLOBIN-HEMACUE: Hemoglobin: 12.4 g/dL — ABNORMAL LOW (ref 13.0–17.0)

## 2018-02-08 LAB — FERRITIN: Ferritin: 81 ng/mL (ref 24–336)

## 2018-02-08 LAB — IRON AND TIBC
IRON: 60 ug/dL (ref 45–182)
SATURATION RATIOS: 20 % (ref 17.9–39.5)
TIBC: 298 ug/dL (ref 250–450)
UIBC: 238 ug/dL

## 2018-02-08 MED ORDER — EPOETIN ALFA 10000 UNIT/ML IJ SOLN: 10000.0000 [IU] | INTRAMUSCULAR | Status: DC

## 2018-02-15 ENCOUNTER — Encounter (HOSPITAL_COMMUNITY): Payer: Medicare Other

## 2018-02-19 ENCOUNTER — Encounter: Payer: Self-pay | Admitting: Nurse Practitioner

## 2018-02-19 ENCOUNTER — Ambulatory Visit: Payer: Medicare Other | Admitting: Nurse Practitioner

## 2018-02-19 VITALS — BP 148/64 | HR 64 | Temp 98.6°F | Resp 16 | Ht 67.0 in | Wt 134.8 lb

## 2018-02-19 DIAGNOSIS — E1122 Type 2 diabetes mellitus with diabetic chronic kidney disease: Secondary | ICD-10-CM | POA: Diagnosis not present

## 2018-02-19 DIAGNOSIS — I10 Essential (primary) hypertension: Secondary | ICD-10-CM

## 2018-02-19 DIAGNOSIS — N184 Chronic kidney disease, stage 4 (severe): Secondary | ICD-10-CM | POA: Diagnosis not present

## 2018-02-19 LAB — POCT GLYCOSYLATED HEMOGLOBIN (HGB A1C): Hemoglobin A1C: 6.6

## 2018-02-19 NOTE — Patient Instructions (Addendum)
Please try to check your blood pressure once daily or at least a few times a week, at the same time each day, and keep a log. Please let me know if your blood pressure readings are over 150/90, otherwise we will leave your medications as you have currently been taking them. Please return in about 3-6 months, you can bring your blood pressure cuff in to your next visit if you would like to have it checked.  We can do your annual physical at your next visit if you are feeling well.  It was good to see you. Thanks for letting me take care of you today :)

## 2018-02-19 NOTE — Assessment & Plan Note (Signed)
BP appears stable overall with mild elevation in systolic readings On chart review it appears cardiology also follows him for BP with next cardiology visit in June  Due to age and lower diastolic readings we will maintain medications at current dosages and he will continue to monitor BP readings at home with 3-6 months follow up or sooner for readings > 150/90 We also Discussed the role of eating a healthy balanced diet and regular exercise in the management of hypertension

## 2018-02-19 NOTE — Assessment & Plan Note (Signed)
A1c remains stable off medications with no high or low readings at home He will continue to monitor blood glucose at home and follow up for readings <80, > 200 F/u in 3- 6 months - POCT HgB A1C Lab Results  Component Value Date   HGBA1C 6.6 02/19/2018

## 2018-02-19 NOTE — Progress Notes (Signed)
Name: Gerald Jacobs   MRN: [id number]    DOB: 03-23-1925   Date:02/19/2018       Progress Note  Subjective  Chief Complaint  Chief Complaint  Patient presents with  . Follow-up    Hypertension, A1c check    HPI Gerald Jacobs is following up today for blood pressure and diabetes management. He continues to follow up with nephrology for anemia and CKD stage IV, cardiology for hypertension and PVCs, and oncology for cholangiocarcinoma.  Hypertension -maintained on amlodipine 2.5, losartan 50, metoprolol succinate 50. Over his past few office visits his systolic readings have been mildly elevated but he monitors his blood pressure at home regularly with normal readings: 120's-130's/50s. Reports daily medication compliance without adverse medication effects. Denies headaches, vision changes, chest pain, shortness of breath, edema.  BP Readings from Last 3 Encounters:  02/19/18 (!) 148/64  02/08/18 (!) 141/54  02/01/18 (!) 144/53   Diabetes- maintained off medications currently. At his last office visit in January he told me he had stopped his onglyza on his own in December due to normal recent A1c and good blood sugar readings at home He wanted to have a trial off medications so we discussed monitoring blood sugar readings at home and returning in 3 months for A1c Reports he has been routinely monitoring his blood sugars at home with no readings over 200. Denies hypoglycemia, tremor, diaphoresis, polyuria, polydipsia, polyphagia.  Lab Results  Component Value Date   HGBA1C 5.7 09/21/2017      Patient Active Problem List   Diagnosis Date Noted  . Mass of bile duct 06/27/2017  . Liver mass   . Hyponatremia 06/15/2017  . Weight loss 06/15/2017  . Hyperbilirubinemia 06/15/2017  . Cough 10/27/2016  . Chest pain 10/27/2016  . Essential hypertension 09/02/2016  . Bilateral shoulder pain 07/28/2016  . Sciatica of right side 07/28/2016  . Statin myopathy 07/28/2016  . Normocytic  anemia 06/23/2016  . Medicare annual wellness visit, subsequent 01/18/2016  . Routine general medical examination at a health care facility 01/18/2016  . Frequent PVCs 11/24/2014  . Intermittent palpitations 07/23/2014  . Chronic cholecystitis with calculus 02/04/2014  . Choledocholithiasis 08/28/2013  . Symptomatic cholelithiasis 06/04/2013  . Elevated LFTs 06/04/2013  . Benign hypertensive heart disease without heart failure 09/07/2011  . Type 2 diabetes mellitus with stage 4 chronic kidney disease, without long-term current use of insulin ([place]) 09/07/2011  . Dyslipidemia 09/07/2011  . CKD (chronic kidney disease), stage IV ([place]) 09/07/2011    Past Surgical History:  Procedure Laterality Date  . CHOLECYSTECTOMY  02/04/2014   DR Zella Richer  . CHOLECYSTECTOMY N/A 02/04/2014   Procedure: LAPAROSCOPIC CHOLECYSTECTOMY with intraoperative cholangiogram;  Surgeon: Odis Hollingshead, MD;  Location: [facility];  Service: General;  Laterality: N/A;  . ERCP N/A 10/15/2013   Procedure: ENDOSCOPIC RETROGRADE CHOLANGIOPANCREATOGRAPHY (ERCP);  Surgeon: Jeryl Columbia, MD;  Location: [facility];  Service: Endoscopy;  Laterality: N/A;  . KNEE SURGERY Right 1979  . melanoma surgery  1970's   on scalp  . PROSTATE SURGERY  1993   Prostate Cancer  . SPHINCTEROTOMY  10/15/2013   Procedure: SPHINCTEROTOMY;  Surgeon: Jeryl Columbia, MD;  Location: [facility];  Service: Endoscopy;;    Family History  Problem Relation Age of Onset  . Stroke Father   . Stroke Mother   . Stroke Sister   . Stroke Brother   . Stroke Brother   . Stroke Brother     Social History   Socioeconomic  History  . Marital status: Married    Spouse name: Not on file  . Number of children: 2  . Years of education: 38  . Highest education level: Not on file  Occupational History  . Not on file  Social Needs  . Financial resource strain: Not on file  . Food insecurity:    Worry: Not on file    Inability: Not on file  .  Transportation needs:    Medical: Not on file    Non-medical: Not on file  Tobacco Use  . Smoking status: Former Smoker    Packs/day: 1.00    Years: 45.00    Pack years: 45.00    Types: Cigarettes    Last attempt to quit: 09/07/1979    Years since quitting: 38.4  . Smokeless tobacco: Never Used  Substance and Sexual Activity  . Alcohol use: Yes    Comment: beer rarely  . Drug use: No  . Sexual activity: Not on file  Lifestyle  . Physical activity:    Days per week: Not on file    Minutes per session: Not on file  . Stress: Not on file  Relationships  . Social connections:    Talks on phone: Not on file    Gets together: Not on file    Attends religious service: Not on file    Active member of club or organization: Not on file    Attends meetings of clubs or organizations: Not on file    Relationship status: Not on file  . Intimate partner violence:    Fear of current or ex partner: Not on file    Emotionally abused: Not on file    Physically abused: Not on file    Forced sexual activity: Not on file  Other Topics Concern  . Not on file  Social History Narrative   Fun/Hobby: Playing golf when he can.      Current Outpatient Medications:  .  allopurinol (ZYLOPRIM) 100 MG tablet, TAKE 1 TABLET (100 MG TOTAL) BY MOUTH DAILY., Disp: 90 tablet, Rfl: 1 .  amLODipine (NORVASC) 2.5 MG tablet, Take 1 tablet (2.5 mg total) by mouth daily., Disp: 30 tablet, Rfl: 8 .  aspirin 81 MG tablet, Take 81 mg by mouth daily.  , Disp: , Rfl:  .  Biotin 1000 MCG tablet, Take 1,000 mcg by mouth daily., Disp: , Rfl:  .  cholecalciferol (VITAMIN D) 1000 units tablet, Take 4,000 Units by mouth daily. , Disp: , Rfl:  .  feeding supplement, GLUCERNA SHAKE, (GLUCERNA SHAKE) LIQD, Take 237 mLs by mouth 2 (two) times daily between meals., Disp: 237 mL, Rfl: 0 .  losartan (COZAAR) 50 MG tablet, Take 1 tablet (50 mg total) daily by mouth., Disp: 90 tablet, Rfl: 1 .  metoprolol succinate (TOPROL-XL) 50  MG 24 hr tablet, Take 1 tablet (50 mg total) by mouth every morning., Disp: 90 tablet, Rfl: 1 .  ONE TOUCH ULTRA TEST test strip, USE ONE STRIP PER TEST. CHECK BLOOD SUGARS 1-4 TIMES PER DAY AS INSTRUCTED., Disp: 100 each, Rfl: 4 .  ONETOUCH DELICA LANCETS 97Q MISC, TEST BLOOD SUGARS 1-4 TIMES A DAY AS INSTRUCTED. DX CODE: E11.9, Disp: 100 each, Rfl: 2 .  traZODone (DESYREL) 50 MG tablet, Take 0.5-1 tablets (25-50 mg total) by mouth at bedtime as needed for sleep., Disp: 30 tablet, Rfl: 3  Allergies  Allergen Reactions  . Colchicine Other (See Comments)    GI problems  . Flexeril [Cyclobenzaprine Hcl]  Other (See Comments)    GI problems   . Levaquin [Levofloxacin In D5w] Other (See Comments)    Insomnia   . Percocet [Oxycodone-Acetaminophen] Nausea And Vomiting  . Zebeta Other (See Comments)    Gi problems     ROS See HPI  Objective  Vitals:   02/19/18 1052  BP: (!) 148/64  Pulse: 64  Resp: 16  Temp: 98.6 F (37 C)  TempSrc: Oral  SpO2: 95%  Weight: 134 lb 12.8 oz (61.1 kg)  Height: 5\' 7"  (1.702 m)    Body mass index is 21.11 kg/m.  Physical Exam Vital signs reviewed. Constitutional: Patient appears well-developed and well-nourished. No distress.  HENT: Head: Normocephalic and atraumatic. Ears: Bilateral TMs without erythema or effusion; Nose: Nose normal. Mouth/Throat: Oropharynx is clear and moist. Eyes: Conjunctivae and EOM are normal. Pupils are equal, round, and reactive to light. No scleral icterus.  Neck: Normal range of motion. Neck supple.  Cardiovascular: Normal rate, regular rhythm and normal heart sounds.  No BLE edema. Distal pulses intact. Pulmonary/Chest: Effort normal and breath sounds normal. No respiratory distress. Neurological: he is alert and oriented to person, place, and time. Coordination, balance, strength, speech and gait are normal.  Skin: Skin is warm and dry. No rash noted. No erythema.  Psychiatric: Patient has a normal mood and  affect. behavior is normal. Judgment and thought content normal.  Assessment & Plan RTC in 3-6 months for CPE, BP recheck in 3 months, A1c in 6 months

## 2018-02-22 ENCOUNTER — Ambulatory Visit (HOSPITAL_COMMUNITY)
Admission: RE | Admit: 2018-02-22 | Discharge: 2018-02-22 | Disposition: A | Discharge status: Home / Self Care | Payer: Medicare Other | Source: Ambulatory Visit | Attending: Nephrology | Admitting: Nephrology

## 2018-02-22 VITALS — BP 146/57 | HR 64 | Temp 97.7°F | Resp 20

## 2018-02-22 DIAGNOSIS — D631 Anemia in chronic kidney disease: Secondary | ICD-10-CM | POA: Insufficient documentation

## 2018-02-22 DIAGNOSIS — N184 Chronic kidney disease, stage 4 (severe): Secondary | ICD-10-CM | POA: Diagnosis not present

## 2018-02-22 DIAGNOSIS — D649 Anemia, unspecified: Secondary | ICD-10-CM

## 2018-02-22 LAB — POCT HEMOGLOBIN-HEMACUE: Hemoglobin: 11.4 g/dL — ABNORMAL LOW (ref 13.0–17.0)

## 2018-02-22 MED ORDER — EPOETIN ALFA 10000 UNIT/ML IJ SOLN
INTRAMUSCULAR | Status: AC
  Administered 2018-02-22: 10:00:00 10000 [IU] via SUBCUTANEOUS
  Filled 2018-02-22: qty 1

## 2018-02-22 MED ORDER — EPOETIN ALFA 10000 UNIT/ML IJ SOLN
10000.0000 [IU] | INTRAMUSCULAR | Status: DC
  Administered 2018-02-22: 10000 [IU] via SUBCUTANEOUS

## 2018-02-26 ENCOUNTER — Encounter: Payer: Self-pay | Admitting: Physician Assistant

## 2018-02-26 ENCOUNTER — Other Ambulatory Visit (INDEPENDENT_AMBULATORY_CARE_PROVIDER_SITE_OTHER): Payer: Medicare Other

## 2018-02-26 ENCOUNTER — Ambulatory Visit (INDEPENDENT_AMBULATORY_CARE_PROVIDER_SITE_OTHER): Payer: Medicare Other | Admitting: *Deleted

## 2018-02-26 ENCOUNTER — Encounter: Payer: Self-pay | Admitting: Nurse Practitioner

## 2018-02-26 ENCOUNTER — Other Ambulatory Visit: Payer: Self-pay | Admitting: Nurse Practitioner

## 2018-02-26 ENCOUNTER — Other Ambulatory Visit: Payer: Self-pay | Admitting: Family

## 2018-02-26 VITALS — BP 134/50 | HR 59 | Resp 18 | Ht 67.0 in | Wt 144.0 lb

## 2018-02-26 DIAGNOSIS — K838 Other specified diseases of biliary tract: Secondary | ICD-10-CM

## 2018-02-26 DIAGNOSIS — Z0000 Encounter for general adult medical examination without abnormal findings: Secondary | ICD-10-CM | POA: Diagnosis not present

## 2018-02-26 LAB — CBC WITH DIFFERENTIAL/PLATELET
BASOS ABS: 0 10*3/uL (ref 0.0–0.1)
BASOS PCT: 0.5 % (ref 0.0–3.0)
EOS ABS: 0.6 10*3/uL (ref 0.0–0.7)
Eosinophils Relative: 7.1 % — ABNORMAL HIGH (ref 0.0–5.0)
HEMATOCRIT: 32.1 % — AB (ref 39.0–52.0)
HEMOGLOBIN: 10.8 g/dL — AB (ref 13.0–17.0)
LYMPHS PCT: 25.4 % (ref 12.0–46.0)
Lymphs Abs: 2.1 10*3/uL (ref 0.7–4.0)
MCHC: 33.5 g/dL (ref 30.0–36.0)
MCV: 95 fl (ref 78.0–100.0)
MONO ABS: 0.8 10*3/uL (ref 0.1–1.0)
Monocytes Relative: 9.9 % (ref 3.0–12.0)
Neutro Abs: 4.7 10*3/uL (ref 1.4–7.7)
Neutrophils Relative %: 57.1 % (ref 43.0–77.0)
Platelets: 186 10*3/uL (ref 150.0–400.0)
RBC: 3.38 Mil/uL — AB (ref 4.22–5.81)
RDW: 17 % — AB (ref 11.5–15.5)
WBC: 8.3 10*3/uL (ref 4.0–10.5)

## 2018-02-26 LAB — COMPREHENSIVE METABOLIC PANEL
ALBUMIN: 3.6 g/dL (ref 3.5–5.2)
ALK PHOS: 96 U/L (ref 39–117)
ALT: 32 U/L (ref 0–53)
AST: 23 U/L (ref 0–37)
BILIRUBIN TOTAL: 0.4 mg/dL (ref 0.2–1.2)
BUN: 35 mg/dL — AB (ref 6–23)
CALCIUM: 8.7 mg/dL (ref 8.4–10.5)
CHLORIDE: 100 meq/L (ref 96–112)
CO2: 25 mEq/L (ref 19–32)
CREATININE: 2.38 mg/dL — AB (ref 0.40–1.50)
GFR: 27.41 mL/min — ABNORMAL LOW (ref >60.00)
Glucose, Bld: 172 mg/dL — ABNORMAL HIGH (ref 70–99)
Potassium: 4.6 mEq/L (ref 3.5–5.1)
Sodium: 133 mEq/L — ABNORMAL LOW (ref 135–145)
TOTAL PROTEIN: 7.3 g/dL (ref 6.0–8.3)

## 2018-02-26 LAB — LIPASE: Lipase: 41 U/L (ref 11.0–59.0)

## 2018-02-26 NOTE — Patient Instructions (Signed)
Continue doing brain stimulating activities (puzzles, reading, adult coloring books, staying active) to keep memory sharp.   Continue to eat heart healthy diet (full of fruits, vegetables, whole grains, lean protein, water--limit salt, fat, and sugar intake) and increase physical activity as tolerated.   Gerald Jacobs , Thank you for taking time to come for your Medicare Wellness Visit. I appreciate your ongoing commitment to your health goals. Please review the following plan we discussed and let me know if I can assist you in the future.   These are the goals we discussed: Goals    . Patient Stated     Stay as healthy and as independent as possible. Go back to the [employer]       This is a list of the screening recommended for you and due dates:  Health Maintenance  Topic Date Due  . Tetanus Vaccine  10/21/1944  . Eye exam for diabetics  01/16/2018  . Complete foot exam   05/09/2018  . Flu Shot  06/28/2018  . Hemoglobin A1C  08/22/2018  . Pneumonia vaccines  Completed   It is important to avoid accidents which may result in broken bones.  Here are a few ideas on how to make your home safer so you will be less likely to trip or fall.  1. Use nonskid mats or non slip strips in your shower or tub, on your bathroom floor and around sinks.  If you know that you have spilled water, wipe it up! 2. In the bathroom, it is important to have properly installed grab bars on the walls or on the edge of the tub.  Towel racks are NOT strong enough for you to hold onto or to pull on for support. 3. Stairs and hallways should have enough light.  Add lamps or night lights if you need ore light. 4. It is good to have handrails on both sides of the stairs if possible.  Always fix broken handrails right away. 5. It is important to see the edges of steps.  Paint the edges of outdoor steps white so you can see them better.  Put colored tape on the edge of inside steps. 6. Throw-rugs are dangerous because they can  slide.  Removing the rugs is the best idea, but if they must stay, add adhesive carpet tape to prevent slipping. 7. Do not keep things on stairs or in the halls.  Remove small furniture that blocks the halls as it may cause you to trip.  Keep telephone and electrical cords out of the way where you walk. 8. Always were sturdy, rubber-soled shoes for good support.  Never wear just socks, especially on the stairs.  Socks may cause you to slip or fall.  Do not wear full-length housecoats as you can easily trip on the bottom.  9. Place the things you use the most on the shelves that are the easiest to reach.  If you use a stepstool, make sure it is in good condition.  If you feel unsteady, DO NOT climb, ask for help. 10. If a health professional advises you to use a cane or walker, do not be ashamed.  These items can keep you from falling and breaking your bones.

## 2018-02-26 NOTE — Progress Notes (Signed)
Medical screening examination/treatment/procedure(s) were performed by the Wellness Coach, RN. As primary care provider I was immediately available for consulation/collaboration. I agree with above documentation. Ashleigh Shambley, NP  

## 2018-02-26 NOTE — Progress Notes (Signed)
Subjective:   Gerald Jacobs is a 84 y.o. male who presents for Medicare Annual/Subsequent preventive examination.  Review of Systems:  No ROS.  Medicare Wellness Visit. Additional risk factors are reflected in the social history.  Cardiac Risk Factors include: family history of premature cardiovascular disease;hypertension;male gender;diabetes mellitus;dyslipidemia Sleep patterns: gets up 1 times nightly to void and sleeps 7 hours nightly.    Home Safety/Smoke Alarms: Feels safe in home. Smoke alarms in place.  Living environment; residence and Firearm Safety: 2-story house, equipment: Radio producer, Type: [place], no firearms. Seat Belt Safety/Bike Helmet: Wears seat belt.   PSA-  Lab Results  Component Value Date   PSA 0.01 (L) 02/08/2017       Objective:    Vitals: BP (!) 134/50   Pulse (!) 59   Resp 18   Ht 5\' 7"  (1.702 m)   Wt 144 lb (65.3 kg)   SpO2 100%   BMI 22.55 kg/m   Body mass index is 22.55 kg/m.  Advanced Directives 02/26/2018 06/15/2017 02/04/2014 01/28/2014 10/15/2013 09/30/2013  Does Patient Have a Medical Advance Directive? Yes No Patient has advance directive, copy in chart Patient has advance directive, copy not in chart Patient has advance directive, copy not in chart Patient has advance directive, copy not in chart  Type of Advance Directive [place];Living will - Healthcare Power of [place];Living will [place];Living will [place];Living will  Does patient want to make changes to medical advance directive? - - No change requested No change requested - -  Copy of [place] in Chart? No - copy requested - Copy requested from other (Comment) Copy requested from family Copy requested from family Copy requested from family  Would patient like information on creating a medical advance directive? - No - Patient declined - - - -  Pre-existing out of  facility DNR order (yellow form or pink MOST form) - - No - No -    Tobacco Social History   Tobacco Use  Smoking Status Former Smoker  . Packs/day: 1.00  . Years: 45.00  . Pack years: 45.00  . Types: Cigarettes  . Last attempt to quit: 09/07/1979  . Years since quitting: 38.4  Smokeless Tobacco Never Used     Counseling given: Not Answered     Past Medical History:  Diagnosis Date  . Bilateral shoulder pain 07/28/2016  . Cancer [facility])    prostate  . Chest pain 10/27/2016  . Chronic anemia    With normal iron studies and normal serum protein electrophoresis  . Coronary artery disease    Mild  . Cough 10/27/2016  . Diabetes mellitus    Adult onset  . Essential hypertension   . GERD (gastroesophageal reflux disease)    occ tums  . History of gout   . Hyperbilirubinemia 06/15/2017  . Hyponatremia 06/15/2017  . Intermittent palpitations 07/23/2014  . Liver mass   . Mass of bile duct 06/27/2017  . Mild renal insufficiency   . Nocturia    x2  . Normocytic anemia 06/23/2016  . Osteoarthritis   . Sciatica of right side 07/28/2016  . Statin myopathy 07/28/2016  . Weight loss 06/15/2017   Past Surgical History:  Procedure Laterality Date  . CHOLECYSTECTOMY  02/04/2014   DR Zella Richer  . CHOLECYSTECTOMY N/A 02/04/2014   Procedure: LAPAROSCOPIC CHOLECYSTECTOMY with intraoperative cholangiogram;  Surgeon: Odis Hollingshead, MD;  Location: [facility];  Service:  General;  Laterality: N/A;  . ERCP N/A 10/15/2013   Procedure: ENDOSCOPIC RETROGRADE CHOLANGIOPANCREATOGRAPHY (ERCP);  Surgeon: Jeryl Columbia, MD;  Location: [facility];  Service: Endoscopy;  Laterality: N/A;  . KNEE SURGERY Right 1979  . melanoma surgery  1970's   on scalp  . PROSTATE SURGERY  1993   Prostate Cancer  . SPHINCTEROTOMY  10/15/2013   Procedure: SPHINCTEROTOMY;  Surgeon: Jeryl Columbia, MD;  Location: [facility];  Service: Endoscopy;;   Family History  Problem Relation Age of Onset  . Stroke Father   .  Stroke Mother   . Stroke Sister   . Stroke Brother   . Stroke Brother   . Stroke Brother    Social History   Socioeconomic History  . Marital status: Married    Spouse name: Not on file  . Number of children: 2  . Years of education: 38  . Highest education level: Not on file  Occupational History  . Not on file  Social Needs  . Financial resource strain: Not hard at all  . Food insecurity:    Worry: Never true    Inability: Never true  . Transportation needs:    Medical: No    Non-medical: No  Tobacco Use  . Smoking status: Former Smoker    Packs/day: 1.00    Years: 45.00    Pack years: 45.00    Types: Cigarettes    Last attempt to quit: 09/07/1979    Years since quitting: 38.4  . Smokeless tobacco: Never Used  Substance and Sexual Activity  . Alcohol use: Yes    Comment: beer rarely  . Drug use: No  . Sexual activity: Not Currently  Lifestyle  . Physical activity:    Days per week: 0 days    Minutes per session: 0 min  . Stress: Not at all  Relationships  . Social connections:    Talks on phone: More than three times a week    Gets together: More than three times a week    Attends religious service: More than 4 times per year    Active member of club or organization: Yes    Attends meetings of clubs or organizations: More than 4 times per year    Relationship status: Married  Other Topics Concern  . Not on file  Social History Narrative        Outpatient Encounter Medications as of 02/26/2018  Medication Sig  . allopurinol (ZYLOPRIM) 100 MG tablet TAKE 1 TABLET (100 MG TOTAL) BY MOUTH DAILY.  Marland Kitchen amLODipine (NORVASC) 2.5 MG tablet Take 1 tablet (2.5 mg total) by mouth daily.  Marland Kitchen aspirin 81 MG tablet Take 81 mg by mouth daily.    . Biotin 1000 MCG tablet Take 1,000 mcg by mouth daily.  . cholecalciferol (VITAMIN D) 1000 units tablet Take 4,000 Units by mouth daily.   . feeding supplement, GLUCERNA SHAKE, (GLUCERNA SHAKE) LIQD Take 237 mLs by mouth 2 (two)  times daily between meals.  Marland Kitchen losartan (COZAAR) 50 MG tablet Take 1 tablet (50 mg total) daily by mouth.  . metoprolol succinate (TOPROL-XL) 50 MG 24 hr tablet Take 1 tablet (50 mg total) by mouth every morning.  . ONE TOUCH ULTRA TEST test strip USE ONE STRIP PER TEST. CHECK BLOOD SUGARS 1-4 TIMES PER DAY AS INSTRUCTED.  Marland Kitchen ONETOUCH DELICA LANCETS 62E MISC TEST BLOOD SUGARS 1-4 TIMES A DAY AS INSTRUCTED. DX CODE: E11.9  . traZODone (DESYREL) 50 MG tablet Take 0.5-1 tablets (  25-50 mg total) by mouth at bedtime as needed for sleep.   No facility-administered encounter medications on file as of 02/26/2018.     Activities of Daily Living In your present state of health, do you have any difficulty performing the following activities: 02/26/2018 02/22/2018  Hearing? Y N  Vision? N N  Difficulty concentrating or making decisions? N N  Walking or climbing stairs? Y N  Dressing or bathing? N N  Doing errands, shopping? N -  Preparing Food and eating ? N -  Using the Toilet? N -  In the past six months, have you accidently leaked urine? N -  Do you have problems with loss of bowel control? N -  Managing your Medications? N -  Managing your Finances? N -  Housekeeping or managing your Housekeeping? N -  Some recent data might be hidden    Patient Care Team: Lance Sell, NP as PCP - General (Nurse Practitioner)   Assessment:   This is a routine wellness examination for [place]. Physical assessment deferred to PCP.   Exercise Activities and Dietary recommendations Current Exercise Habits: The patient does not participate in regular exercise at present(chair exercise pamphlet provided), Exercise limited by: (S) Other - see comments(weakness )  Diet (meal preparation, eat out, water intake, caffeinated beverages, dairy products, fruits and vegetables): in general, a "healthy" diet  , well balanced, reports poor appetite.  Discussed drinking glucerna (coupons and samples provided),  encouraged patient to increase daily water intake. Reviewed heart healthy and diabetic diet   Goals    . Patient Stated     Stay as healthy and as independent as possible. Go back to the [facility]       Fall Risk Fall Risk  02/26/2018 01/24/2017  Falls in the past year? No Yes  Number falls in past yr: - 1  Injury with Fall? - Yes    Depression Screen PHQ 2/9 Scores 02/26/2018 01/24/2017 01/18/2016  PHQ - 2 Score 2 0 2  PHQ- 9 Score 5 - 5    Cognitive Function MMSE - Mini Mental State Exam 02/26/2018  Orientation to time 5  Orientation to Place 5  Registration 3  Attention/ Calculation 5  Recall 3  Language- name 2 objects 2  Language- repeat 1  Language- follow 3 step command 3  Language- read & follow direction 1  Write a sentence 1  Copy design 1  Total score 30        Immunization History  Administered Date(s) Administered  . Influenza, High Dose Seasonal PF 09/21/2017  . Influenza,inj,Quad PF,6+ Mos 09/30/2015  . Influenza-Unspecified 08/28/2014  . Pneumococcal Conjugate-13 01/18/2016  . Pneumococcal Polysaccharide-23 02/05/2014   Screening Tests Health Maintenance  Topic Date Due  . TETANUS/TDAP  10/21/1946  . FOOT EXAM  05/09/2018  . INFLUENZA VACCINE  06/28/2018  . HEMOGLOBIN A1C  08/22/2018  . OPHTHALMOLOGY EXAM  09/28/2018  . PNA vac Low Risk Adult  Completed      Plan:    Continue doing brain stimulating activities (puzzles, reading, adult coloring books, staying active) to keep memory sharp.   Continue to eat heart healthy diet (full of fruits, vegetables, whole grains, lean protein, water--limit salt, fat, and sugar intake) and increase physical activity as tolerated.   I have personally reviewed and noted the following in the patient's chart:   . Medical and social history . Use of alcohol, tobacco or illicit drugs  . Current medications and supplements . Functional ability and  status . Nutritional status . Physical activity . Advanced  directives . List of other physicians . Vitals . Screenings to include cognitive, depression, and falls . Referrals and appointments  In addition, I have reviewed and discussed with patient certain preventive protocols, quality metrics, and best practice recommendations. A written personalized care plan for preventive services as well as general preventive health recommendations were provided to patient.     Michiel Cowboy, RN  02/26/2018

## 2018-02-27 ENCOUNTER — Other Ambulatory Visit: Payer: Self-pay

## 2018-02-27 ENCOUNTER — Encounter: Payer: Self-pay | Admitting: Nurse Practitioner

## 2018-02-27 MED ORDER — ALLOPURINOL 100 MG PO TABS: 100.0000 mg | ORAL_TABLET | Freq: Every day | ORAL | 0 refills | Status: DC

## 2018-03-02 ENCOUNTER — Ambulatory Visit (HOSPITAL_COMMUNITY)
Admission: RE | Admit: 2018-03-02 | Discharge: 2018-03-02 | Disposition: A | Discharge status: Home / Self Care | Payer: Medicare Other | Source: Ambulatory Visit | Attending: Nephrology | Admitting: Nephrology

## 2018-03-02 VITALS — BP 141/67 | HR 57 | Temp 97.7°F | Resp 20

## 2018-03-02 DIAGNOSIS — Z79899 Other long term (current) drug therapy: Secondary | ICD-10-CM | POA: Diagnosis not present

## 2018-03-02 DIAGNOSIS — Z5181 Encounter for therapeutic drug level monitoring: Secondary | ICD-10-CM | POA: Diagnosis not present

## 2018-03-02 DIAGNOSIS — D631 Anemia in chronic kidney disease: Secondary | ICD-10-CM | POA: Insufficient documentation

## 2018-03-02 DIAGNOSIS — N184 Chronic kidney disease, stage 4 (severe): Secondary | ICD-10-CM | POA: Diagnosis present

## 2018-03-02 DIAGNOSIS — D649 Anemia, unspecified: Secondary | ICD-10-CM

## 2018-03-02 LAB — POCT HEMOGLOBIN-HEMACUE: HEMOGLOBIN: 10.1 g/dL — AB (ref 13.0–17.0)

## 2018-03-02 MED ORDER — EPOETIN ALFA 10000 UNIT/ML IJ SOLN
INTRAMUSCULAR | Status: AC
  Filled 2018-03-02: qty 1

## 2018-03-02 MED ORDER — EPOETIN ALFA 10000 UNIT/ML IJ SOLN
10000.0000 [IU] | INTRAMUSCULAR | Status: DC
  Administered 2018-03-02: 10:00:00 10000 [IU] via SUBCUTANEOUS

## 2018-03-07 ENCOUNTER — Other Ambulatory Visit (HOSPITAL_COMMUNITY): Payer: Self-pay | Admitting: *Deleted

## 2018-03-08 ENCOUNTER — Encounter (HOSPITAL_COMMUNITY)
Admission: RE | Admit: 2018-03-08 | Discharge: 2018-03-08 | Disposition: A | Discharge status: Home / Self Care | Payer: Medicare Other | Source: Ambulatory Visit | Attending: Nephrology | Admitting: Nephrology

## 2018-03-08 VITALS — BP 157/51 | HR 58 | Temp 97.6°F | Resp 20

## 2018-03-08 DIAGNOSIS — D631 Anemia in chronic kidney disease: Secondary | ICD-10-CM | POA: Diagnosis present

## 2018-03-08 DIAGNOSIS — N183 Chronic kidney disease, stage 3 (moderate): Secondary | ICD-10-CM | POA: Diagnosis present

## 2018-03-08 DIAGNOSIS — D649 Anemia, unspecified: Secondary | ICD-10-CM

## 2018-03-08 LAB — FERRITIN: FERRITIN: 75 ng/mL (ref 24–336)

## 2018-03-08 LAB — IRON AND TIBC
Iron: 61 ug/dL (ref 45–182)
Saturation Ratios: 21 % (ref 17.9–39.5)
TIBC: 294 ug/dL (ref 250–450)
UIBC: 233 ug/dL

## 2018-03-08 LAB — POCT HEMOGLOBIN-HEMACUE: HEMOGLOBIN: 10.6 g/dL — AB (ref 13.0–17.0)

## 2018-03-08 MED ORDER — EPOETIN ALFA 10000 UNIT/ML IJ SOLN
10000.0000 [IU] | INTRAMUSCULAR | Status: DC
  Administered 2018-03-08: 10000 [IU] via SUBCUTANEOUS

## 2018-03-08 MED ORDER — EPOETIN ALFA 10000 UNIT/ML IJ SOLN
INTRAMUSCULAR | Status: AC
  Administered 2018-03-08: 10000 [IU] via SUBCUTANEOUS
  Filled 2018-03-08: qty 1

## 2018-03-15 ENCOUNTER — Encounter (HOSPITAL_COMMUNITY)
Admission: RE | Admit: 2018-03-15 | Discharge: 2018-03-15 | Disposition: A | Discharge status: Home / Self Care | Payer: Medicare Other | Source: Ambulatory Visit | Attending: Nephrology | Admitting: Nephrology

## 2018-03-15 VITALS — BP 141/55 | HR 58 | Temp 97.8°F | Resp 20

## 2018-03-15 DIAGNOSIS — N183 Chronic kidney disease, stage 3 (moderate): Secondary | ICD-10-CM | POA: Diagnosis not present

## 2018-03-15 DIAGNOSIS — D649 Anemia, unspecified: Secondary | ICD-10-CM

## 2018-03-15 LAB — POCT HEMOGLOBIN-HEMACUE: HEMOGLOBIN: 11.1 g/dL — AB (ref 13.0–17.0)

## 2018-03-15 MED ORDER — EPOETIN ALFA 10000 UNIT/ML IJ SOLN
10000.0000 [IU] | INTRAMUSCULAR | Status: DC
  Administered 2018-03-15: 09:00:00 10000 [IU] via SUBCUTANEOUS

## 2018-03-15 MED ORDER — EPOETIN ALFA 10000 UNIT/ML IJ SOLN
INTRAMUSCULAR | Status: AC
  Filled 2018-03-15: qty 1

## 2018-03-23 ENCOUNTER — Ambulatory Visit (HOSPITAL_COMMUNITY)
Admission: RE | Admit: 2018-03-23 | Discharge: 2018-03-23 | Disposition: A | Discharge status: Home / Self Care | Payer: Medicare Other | Source: Ambulatory Visit | Attending: Nephrology | Admitting: Nephrology

## 2018-03-23 VITALS — BP 147/69 | HR 59 | Temp 97.8°F | Resp 16

## 2018-03-23 DIAGNOSIS — N184 Chronic kidney disease, stage 4 (severe): Secondary | ICD-10-CM | POA: Insufficient documentation

## 2018-03-23 DIAGNOSIS — D531 Other megaloblastic anemias, not elsewhere classified: Secondary | ICD-10-CM | POA: Insufficient documentation

## 2018-03-23 DIAGNOSIS — D649 Anemia, unspecified: Secondary | ICD-10-CM

## 2018-03-23 LAB — POCT HEMOGLOBIN-HEMACUE: Hemoglobin: 11 g/dL — ABNORMAL LOW (ref 13.0–17.0)

## 2018-03-23 MED ORDER — EPOETIN ALFA 10000 UNIT/ML IJ SOLN
10000.0000 [IU] | INTRAMUSCULAR | Status: DC
  Administered 2018-03-23: 10000 [IU] via SUBCUTANEOUS

## 2018-03-23 MED ORDER — EPOETIN ALFA 10000 UNIT/ML IJ SOLN
INTRAMUSCULAR | Status: AC
  Filled 2018-03-23: qty 1

## 2018-03-28 ENCOUNTER — Other Ambulatory Visit: Payer: Self-pay | Admitting: Nurse Practitioner

## 2018-03-29 ENCOUNTER — Encounter (HOSPITAL_COMMUNITY)
Admission: RE | Admit: 2018-03-29 | Discharge: 2018-03-29 | Disposition: A | Discharge status: Home / Self Care | Payer: Medicare Other | Source: Ambulatory Visit | Attending: Nephrology | Admitting: Nephrology

## 2018-03-29 VITALS — BP 160/62 | HR 57 | Temp 98.1°F | Resp 18

## 2018-03-29 DIAGNOSIS — D649 Anemia, unspecified: Secondary | ICD-10-CM

## 2018-03-29 DIAGNOSIS — D631 Anemia in chronic kidney disease: Secondary | ICD-10-CM | POA: Diagnosis present

## 2018-03-29 DIAGNOSIS — N184 Chronic kidney disease, stage 4 (severe): Secondary | ICD-10-CM | POA: Diagnosis not present

## 2018-03-29 LAB — POCT HEMOGLOBIN-HEMACUE: Hemoglobin: 11.6 g/dL — ABNORMAL LOW (ref 13.0–17.0)

## 2018-03-29 MED ORDER — EPOETIN ALFA 10000 UNIT/ML IJ SOLN
INTRAMUSCULAR | Status: AC
  Administered 2018-03-29: 09:00:00 10000 [IU]
  Filled 2018-03-29: qty 1

## 2018-03-29 MED ORDER — EPOETIN ALFA 10000 UNIT/ML IJ SOLN: 10000.0000 [IU] | INTRAMUSCULAR | Status: DC

## 2018-03-30 ENCOUNTER — Encounter (HOSPITAL_COMMUNITY): Payer: Medicare Other

## 2018-04-05 ENCOUNTER — Ambulatory Visit (HOSPITAL_COMMUNITY)
Admission: RE | Admit: 2018-04-05 | Discharge: 2018-04-05 | Disposition: A | Discharge status: Home / Self Care | Payer: Medicare Other | Source: Ambulatory Visit | Attending: Nephrology | Admitting: Nephrology

## 2018-04-05 VITALS — BP 169/57 | HR 57 | Temp 97.7°F | Resp 20

## 2018-04-05 DIAGNOSIS — Z5181 Encounter for therapeutic drug level monitoring: Secondary | ICD-10-CM | POA: Insufficient documentation

## 2018-04-05 DIAGNOSIS — D631 Anemia in chronic kidney disease: Secondary | ICD-10-CM | POA: Diagnosis not present

## 2018-04-05 DIAGNOSIS — Z79899 Other long term (current) drug therapy: Secondary | ICD-10-CM | POA: Insufficient documentation

## 2018-04-05 DIAGNOSIS — N184 Chronic kidney disease, stage 4 (severe): Secondary | ICD-10-CM | POA: Diagnosis not present

## 2018-04-05 DIAGNOSIS — D649 Anemia, unspecified: Secondary | ICD-10-CM

## 2018-04-05 LAB — IRON AND TIBC
Iron: 81 ug/dL (ref 45–182)
Saturation Ratios: 24 % (ref 17.9–39.5)
TIBC: 336 ug/dL (ref 250–450)
UIBC: 255 ug/dL

## 2018-04-05 LAB — FERRITIN: Ferritin: 22 ng/mL — ABNORMAL LOW (ref 24–336)

## 2018-04-05 LAB — POCT HEMOGLOBIN-HEMACUE: HEMOGLOBIN: 11.7 g/dL — AB (ref 13.0–17.0)

## 2018-04-05 MED ORDER — EPOETIN ALFA 10000 UNIT/ML IJ SOLN
10000.0000 [IU] | INTRAMUSCULAR | Status: DC
  Administered 2018-04-05: 10000 [IU] via SUBCUTANEOUS

## 2018-04-05 MED ORDER — EPOETIN ALFA 10000 UNIT/ML IJ SOLN
INTRAMUSCULAR | Status: AC
  Filled 2018-04-05: qty 1

## 2018-04-11 ENCOUNTER — Encounter (HOSPITAL_COMMUNITY): Payer: Medicare Other

## 2018-04-12 ENCOUNTER — Ambulatory Visit (HOSPITAL_COMMUNITY)
Admission: RE | Admit: 2018-04-12 | Discharge: 2018-04-12 | Disposition: A | Discharge status: Home / Self Care | Payer: Medicare Other | Source: Ambulatory Visit | Attending: Nephrology | Admitting: Nephrology

## 2018-04-12 VITALS — BP 162/57 | HR 61 | Temp 98.2°F | Resp 20

## 2018-04-12 DIAGNOSIS — D649 Anemia, unspecified: Secondary | ICD-10-CM | POA: Diagnosis not present

## 2018-04-12 LAB — POCT HEMOGLOBIN-HEMACUE: HEMOGLOBIN: 11.7 g/dL — AB (ref 13.0–17.0)

## 2018-04-12 MED ORDER — EPOETIN ALFA 10000 UNIT/ML IJ SOLN
INTRAMUSCULAR | Status: AC
  Filled 2018-04-12: qty 1

## 2018-04-12 MED ORDER — EPOETIN ALFA 10000 UNIT/ML IJ SOLN
10000.0000 [IU] | INTRAMUSCULAR | Status: DC
  Administered 2018-04-12: 10000 [IU] via SUBCUTANEOUS

## 2018-04-18 ENCOUNTER — Other Ambulatory Visit (INDEPENDENT_AMBULATORY_CARE_PROVIDER_SITE_OTHER): Payer: Medicare Other

## 2018-04-18 ENCOUNTER — Ambulatory Visit: Payer: Medicare Other | Admitting: Family

## 2018-04-18 ENCOUNTER — Encounter: Payer: Self-pay | Admitting: Family

## 2018-04-18 VITALS — BP 150/62 | HR 65 | Temp 97.8°F | Ht 67.0 in | Wt 144.0 lb

## 2018-04-18 DIAGNOSIS — R112 Nausea with vomiting, unspecified: Secondary | ICD-10-CM | POA: Diagnosis not present

## 2018-04-18 LAB — COMPREHENSIVE METABOLIC PANEL
ALT: 120 U/L — AB (ref 0–53)
AST: 91 U/L — ABNORMAL HIGH (ref 0–37)
Albumin: 3.6 g/dL (ref 3.5–5.2)
Alkaline Phosphatase: 188 U/L — ABNORMAL HIGH (ref 39–117)
BILIRUBIN TOTAL: 2.4 mg/dL — AB (ref 0.2–1.2)
BUN: 33 mg/dL — AB (ref 6–23)
CO2: 25 meq/L (ref 19–32)
Calcium: 9 mg/dL (ref 8.4–10.5)
Chloride: 100 mEq/L (ref 96–112)
Creatinine, Ser: 2.39 mg/dL — ABNORMAL HIGH (ref 0.40–1.50)
GFR: 27.27 mL/min — AB (ref >60.00)
Glucose, Bld: 249 mg/dL — ABNORMAL HIGH (ref 70–99)
Potassium: 4.9 mEq/L (ref 3.5–5.1)
SODIUM: 133 meq/L — AB (ref 135–145)
Total Protein: 7.4 g/dL (ref 6.0–8.3)

## 2018-04-18 LAB — CBC WITH DIFFERENTIAL/PLATELET
Basophils Absolute: 0 10*3/uL (ref 0.0–0.1)
Basophils Relative: 0.4 % (ref 0.0–3.0)
EOS PCT: 2.8 % (ref 0.0–5.0)
Eosinophils Absolute: 0.2 10*3/uL (ref 0.0–0.7)
HCT: 37.7 % — ABNORMAL LOW (ref 39.0–52.0)
HEMOGLOBIN: 12.5 g/dL — AB (ref 13.0–17.0)
Lymphocytes Relative: 15.1 % (ref 12.0–46.0)
Lymphs Abs: 1.2 10*3/uL (ref 0.7–4.0)
MCHC: 33.1 g/dL (ref 30.0–36.0)
MCV: 95.4 fl (ref 78.0–100.0)
MONO ABS: 0.7 10*3/uL (ref 0.1–1.0)
MONOS PCT: 8.6 % (ref 3.0–12.0)
Neutro Abs: 6 10*3/uL (ref 1.4–7.7)
Neutrophils Relative %: 73.1 % (ref 43.0–77.0)
Platelets: 196 10*3/uL (ref 150.0–400.0)
RBC: 3.95 Mil/uL — AB (ref 4.22–5.81)
RDW: 14.6 % (ref 11.5–15.5)
WBC: 8.2 10*3/uL (ref 4.0–10.5)

## 2018-04-18 LAB — HEMOGLOBIN A1C: Hgb A1c MFr Bld: 6.4 % (ref 4.6–6.5)

## 2018-04-18 NOTE — Progress Notes (Signed)
Gerald Jacobs is a 84 y.o. male with the following history as recorded in EpicCare:  Patient Active Problem List   Diagnosis Date Noted  . Mass of bile duct 06/27/2017  . Liver mass   . Hyperbilirubinemia 06/15/2017  . Essential hypertension 09/02/2016  . Statin myopathy 07/28/2016  . Normocytic anemia 06/23/2016  . Medicare annual wellness visit, subsequent 01/18/2016  . Routine general medical examination at a health care facility 01/18/2016  . Frequent PVCs 11/24/2014  . Intermittent palpitations 07/23/2014  . Chronic cholecystitis with calculus 02/04/2014  . Choledocholithiasis 08/28/2013  . Symptomatic cholelithiasis 06/04/2013  . Elevated LFTs 06/04/2013  . Benign hypertensive heart disease without heart failure 09/07/2011  . Type 2 diabetes mellitus with stage 4 chronic kidney disease, without long-term current use of insulin ([place]) 09/07/2011  . Dyslipidemia 09/07/2011  . CKD (chronic kidney disease), stage IV ([place]) 09/07/2011    Current Outpatient Medications  Medication Sig Dispense Refill  . allopurinol (ZYLOPRIM) 100 MG tablet Take 1 tablet (100 mg total) by mouth daily. 90 tablet 0  . amLODipine (NORVASC) 2.5 MG tablet Take 1 tablet (2.5 mg total) by mouth daily. 30 tablet 8  . aspirin 81 MG tablet Take 81 mg by mouth daily.      . Biotin 1000 MCG tablet Take 1,000 mcg by mouth daily.    . cholecalciferol (VITAMIN D) 1000 units tablet Take 4,000 Units by mouth daily.     . feeding supplement, GLUCERNA SHAKE, (GLUCERNA SHAKE) LIQD Take 237 mLs by mouth 2 (two) times daily between meals. 237 mL 0  . losartan (COZAAR) 50 MG tablet TAKE 1 TABLET (50 MG TOTAL) DAILY BY MOUTH. 90 tablet 1  . metoprolol succinate (TOPROL-XL) 50 MG 24 hr tablet Take 1 tablet (50 mg total) by mouth every morning. 90 tablet 1  . ondansetron (ZOFRAN) 8 MG tablet Take by mouth every 8 (eight) hours as needed for nausea or vomiting.    . ONE TOUCH ULTRA TEST test strip USE ONE STRIP PER TEST.  CHECK BLOOD SUGARS 1-4 TIMES PER DAY AS INSTRUCTED. 100 each 4  . ONETOUCH DELICA LANCETS 47Q MISC TEST BLOOD SUGARS 1-4 TIMES A DAY AS INSTRUCTED. DX CODE: E11.9 100 each 2  . pantoprazole (PROTONIX) 40 MG tablet Take by mouth.    . promethazine (PHENERGAN) 12.5 MG tablet Take 12.5 mg by mouth every 6 (six) hours as needed for nausea or vomiting.    . traZODone (DESYREL) 50 MG tablet Take 0.5-1 tablets (25-50 mg total) by mouth at bedtime as needed for sleep. 30 tablet 3   No current facility-administered medications for this visit.     Allergies: Colchicine; Flexeril [cyclobenzaprine hcl]; Levaquin [levofloxacin in d5w]; Percocet [oxycodone-acetaminophen]; and Zebeta  Past Medical History:  Diagnosis Date  . Bilateral shoulder pain 07/28/2016  . Cancer [facility])    prostate  . Chest pain 10/27/2016  . Chronic anemia    With normal iron studies and normal serum protein electrophoresis  . Coronary artery disease    Mild  . Cough 10/27/2016  . Diabetes mellitus    Adult onset  . Essential hypertension   . GERD (gastroesophageal reflux disease)    occ tums  . History of gout   . Hyperbilirubinemia 06/15/2017  . Hyponatremia 06/15/2017  . Intermittent palpitations 07/23/2014  . Liver mass   . Mass of bile duct 06/27/2017  . Mild renal insufficiency   . Nocturia    x2  . Normocytic anemia 06/23/2016  .  Osteoarthritis   . Sciatica of right side 07/28/2016  . Statin myopathy 07/28/2016  . Weight loss 06/15/2017    Past Surgical History:  Procedure Laterality Date  . CHOLECYSTECTOMY  02/04/2014   DR Zella Richer  . CHOLECYSTECTOMY N/A 02/04/2014   Procedure: LAPAROSCOPIC CHOLECYSTECTOMY with intraoperative cholangiogram;  Surgeon: Odis Hollingshead, MD;  Location: [place];  Service: General;  Laterality: N/A;  . ERCP N/A 10/15/2013   Procedure: ENDOSCOPIC RETROGRADE CHOLANGIOPANCREATOGRAPHY (ERCP);  Surgeon: Jeryl Columbia, MD;  Location: [facility];  Service: Endoscopy;  Laterality: N/A;  .  KNEE SURGERY Right 1979  . melanoma surgery  1970's   on scalp  . PROSTATE SURGERY  1993   Prostate Cancer  . SPHINCTEROTOMY  10/15/2013   Procedure: SPHINCTEROTOMY;  Surgeon: Jeryl Columbia, MD;  Location: [facility];  Service: Endoscopy;;    Family History  Problem Relation Age of Onset  . Stroke Father   . Stroke Mother   . Stroke Sister   . Stroke Brother   . Stroke Brother   . Stroke Brother     Social History   Tobacco Use  . Smoking status: Former Smoker    Packs/day: 1.00    Years: 45.00    Pack years: 45.00    Types: Cigarettes    Last attempt to quit: 09/07/1979    Years since quitting: 38.6  . Smokeless tobacco: Never Used  Substance Use Topics  . Alcohol use: Yes    Comment: beer rarely    Subjective:  Patient has history of bilary duct cancer/ liver cancer; notes that yesterday, he just had "a really bad day" with the nausea and fatigue; did not take any of his nausea medications yesterday; notes that bowel movement seemed "abnormal" today; has not contacted his oncologist about symptoms- under care of provider at [facility]; notes that in general, "just not feeling well." Able to eat toast, cereal, jelly this am; also states that last week he had another bad episode of nausea/ vomiting- thought it was food related originally;    Objective:  Vitals:   04/18/18 1013  BP: (!) 150/62  Pulse: 65  Temp: 97.8 F (36.6 C)  TempSrc: Oral  SpO2: 99%  Weight: 144 lb (65.3 kg)  Height: _0  (1.702 m)    General: Well developed, well nourished, in no acute distress  Skin : Warm and dry. Appears jaundiced today Head: Normocephalic and atraumatic  Lungs: Respirations unlabored; clear to auscultation bilaterally without wheeze, rales, rhonchi  CVS exam: normal rate and regular rhythm.  Abdomen: Soft; nontender; nondistended; normoactive bowel sounds; Enlarged liver noted; Musculoskeletal: No deformities; no active joint inflammation  Extremities: No edema, cyanosis,  clubbing  Vessels: Symmetric bilaterally  Neurologic: Alert and oriented; speech intact; face symmetrical; moves all extremities well; CNII-XII intact without focal deficit  Assessment:  1. Nausea and vomiting, intractability of vomiting not specified, unspecified vomiting type     Plan:  Suspect secondary to biliary duct cancer- concern for progressing symptoms; per patient request, I was to call his daughter once I got his labs back today; I ordered STAT CBC and CMP; showed marked elevation in his LFTs today; I called and spoke with both the patient's daughter and patient's oncologist at [facility];  It was agreed that patient needed fluids and possible imaging; family was opting to take patient to [facility] ER since his oncologist is there; agree with that option and asked daughter to call if she or her dad/ family need anything else.  I spent 30 minutes coordinating care for patient between talking to family members and specialists and developing treatment plans.   No follow-ups on file.  Orders Placed This Encounter  Procedures  . CBC w/Diff    Standing Status:   Future    Number of Occurrences:   1    Standing Expiration Date:   04/18/2019  . Comp Met (CMET)    Standing Status:   Future    Number of Occurrences:   1    Standing Expiration Date:   04/18/2019  . HgB A1c    Standing Status:   Future    Number of Occurrences:   1    Standing Expiration Date:   04/18/2019    Requested Prescriptions    No prescriptions requested or ordered in this encounter

## 2018-04-18 NOTE — Progress Notes (Signed)
I reviewed these labs with patient's daughter as he requested and his oncology NP at [facility]; all in agreement that patient needs to go to ER for fluids/ imaging/ further evaluation; family is opting to take patient to [facility] ER in case he needs treatment with his oncologist.

## 2018-04-19 ENCOUNTER — Encounter (HOSPITAL_COMMUNITY): Payer: Medicare Other

## 2018-04-19 MED ORDER — ALLOPURINOL 100 MG PO TABS
100.00 | ORAL_TABLET | ORAL | Status: DC
Start: 2018-04-21 — End: 2018-04-19

## 2018-04-19 MED ORDER — ONDANSETRON HCL 4 MG/2ML IJ SOLN
4.00 | INTRAMUSCULAR | Status: DC
Start: ? — End: 2018-04-19

## 2018-04-19 MED ORDER — LIDOCAINE HCL (PF) 1 % IJ SOLN
0.50 | INTRAMUSCULAR | Status: DC
Start: ? — End: 2018-04-19

## 2018-04-19 MED ORDER — AMLODIPINE BESYLATE 5 MG PO TABS
2.50 | ORAL_TABLET | ORAL | Status: DC
Start: 2018-04-20 — End: 2018-04-19

## 2018-04-19 MED ORDER — METOPROLOL SUCCINATE ER 50 MG PO TB24
50.00 | ORAL_TABLET | ORAL | Status: DC
Start: 2018-04-21 — End: 2018-04-19

## 2018-04-19 MED ORDER — VITAMIN D 50 MCG (2000 UT) PO TABS
2000.00 | ORAL_TABLET | ORAL | Status: DC
Start: 2018-04-21 — End: 2018-04-19

## 2018-04-19 MED ORDER — PANTOPRAZOLE SODIUM 40 MG PO TBEC
40.00 | DELAYED_RELEASE_TABLET | ORAL | Status: DC
Start: 2018-04-21 — End: 2018-04-19

## 2018-04-20 MED ORDER — LACTATED RINGERS IV SOLN
INTRAVENOUS | Status: DC
Start: ? — End: 2018-04-20

## 2018-04-20 MED ORDER — AMLODIPINE BESYLATE 5 MG PO TABS
5.00 | ORAL_TABLET | ORAL | Status: DC
Start: 2018-04-21 — End: 2018-04-20

## 2018-04-25 ENCOUNTER — Telehealth: Payer: Self-pay

## 2018-04-25 ENCOUNTER — Other Ambulatory Visit: Payer: Self-pay

## 2018-04-25 DIAGNOSIS — R899 Unspecified abnormal finding in specimens from other organs, systems and tissues: Secondary | ICD-10-CM

## 2018-04-25 NOTE — Telephone Encounter (Signed)
LVM for pt to call back to clarify labs needing to be done.

## 2018-04-25 NOTE — Telephone Encounter (Signed)
Can you clarify with the patient what labs he is requesting

## 2018-04-25 NOTE — Telephone Encounter (Signed)
Labs have been put in for patient.

## 2018-04-25 NOTE — Telephone Encounter (Signed)
Copied from [place] [id number]. Topic: General - Other >> Apr 24, 2018  6:05 PM Valla Leaver wrote: Reason for CRM: Patient would like orders for labs from [facility].  >> Apr 25, 2018 11:46 AM Percell Belt A wrote: Pt called back and would like these orders put in by tomorrow so he can get results before the weekend

## 2018-04-26 ENCOUNTER — Other Ambulatory Visit (INDEPENDENT_AMBULATORY_CARE_PROVIDER_SITE_OTHER): Payer: Medicare Other

## 2018-04-26 ENCOUNTER — Other Ambulatory Visit: Payer: Self-pay | Admitting: Nurse Practitioner

## 2018-04-26 ENCOUNTER — Telehealth: Payer: Self-pay | Admitting: Nurse Practitioner

## 2018-04-26 ENCOUNTER — Encounter (HOSPITAL_COMMUNITY): Payer: Medicare Other

## 2018-04-26 ENCOUNTER — Ambulatory Visit (HOSPITAL_COMMUNITY)
Admission: RE | Admit: 2018-04-26 | Discharge: 2018-04-26 | Disposition: A | Discharge status: Home / Self Care | Payer: Medicare Other | Source: Ambulatory Visit | Attending: Nephrology | Admitting: Nephrology

## 2018-04-26 VITALS — BP 151/63 | HR 63 | Temp 97.8°F | Resp 20

## 2018-04-26 DIAGNOSIS — N184 Chronic kidney disease, stage 4 (severe): Secondary | ICD-10-CM | POA: Diagnosis present

## 2018-04-26 DIAGNOSIS — R899 Unspecified abnormal finding in specimens from other organs, systems and tissues: Secondary | ICD-10-CM | POA: Diagnosis not present

## 2018-04-26 DIAGNOSIS — D649 Anemia, unspecified: Secondary | ICD-10-CM

## 2018-04-26 DIAGNOSIS — D631 Anemia in chronic kidney disease: Secondary | ICD-10-CM | POA: Insufficient documentation

## 2018-04-26 DIAGNOSIS — I10 Essential (primary) hypertension: Secondary | ICD-10-CM

## 2018-04-26 LAB — CBC WITH DIFFERENTIAL/PLATELET
Basophils Absolute: 0.1 10*3/uL (ref 0.0–0.1)
Basophils Relative: 0.9 % (ref 0.0–3.0)
EOS PCT: 6.5 % — AB (ref 0.0–5.0)
Eosinophils Absolute: 0.5 10*3/uL (ref 0.0–0.7)
HEMATOCRIT: 35 % — AB (ref 39.0–52.0)
HEMOGLOBIN: 11.7 g/dL — AB (ref 13.0–17.0)
LYMPHS PCT: 30 % (ref 12.0–46.0)
Lymphs Abs: 2.5 10*3/uL (ref 0.7–4.0)
MCHC: 33.3 g/dL (ref 30.0–36.0)
MCV: 91.9 fl (ref 78.0–100.0)
MONO ABS: 0.7 10*3/uL (ref 0.1–1.0)
Monocytes Relative: 9 % (ref 3.0–12.0)
Neutro Abs: 4.4 10*3/uL (ref 1.4–7.7)
Neutrophils Relative %: 53.6 % (ref 43.0–77.0)
Platelets: 213 10*3/uL (ref 150.0–400.0)
RBC: 3.81 Mil/uL — AB (ref 4.22–5.81)
RDW: 14.2 % (ref 11.5–15.5)
WBC: 8.2 10*3/uL (ref 4.0–10.5)

## 2018-04-26 LAB — COMPREHENSIVE METABOLIC PANEL
ALBUMIN: 3.6 g/dL (ref 3.5–5.2)
ALT: 18 U/L (ref 0–53)
AST: 12 U/L (ref 0–37)
Alkaline Phosphatase: 97 U/L (ref 39–117)
BUN: 44 mg/dL — AB (ref 6–23)
CALCIUM: 8.8 mg/dL (ref 8.4–10.5)
CO2: 24 mEq/L (ref 19–32)
Chloride: 98 mEq/L (ref 96–112)
Creatinine, Ser: 2.37 mg/dL — ABNORMAL HIGH (ref 0.40–1.50)
GFR: 27.53 mL/min — ABNORMAL LOW (ref >60.00)
Glucose, Bld: 208 mg/dL — ABNORMAL HIGH (ref 70–99)
POTASSIUM: 5.3 meq/L — AB (ref 3.5–5.1)
Sodium: 129 mEq/L — ABNORMAL LOW (ref 135–145)
TOTAL PROTEIN: 7.2 g/dL (ref 6.0–8.3)
Total Bilirubin: 0.4 mg/dL (ref 0.2–1.2)

## 2018-04-26 LAB — POCT HEMOGLOBIN-HEMACUE: HEMOGLOBIN: 11.9 g/dL — AB (ref 13.0–17.0)

## 2018-04-26 MED ORDER — EPOETIN ALFA 10000 UNIT/ML IJ SOLN
10000.0000 [IU] | INTRAMUSCULAR | Status: DC
  Administered 2018-04-26: 10000 [IU] via SUBCUTANEOUS

## 2018-04-26 MED ORDER — AMLODIPINE BESYLATE 5 MG PO TABS: 5.0000 mg | ORAL_TABLET | Freq: Every day | ORAL | 1 refills | Status: DC

## 2018-04-26 MED ORDER — EPOETIN ALFA 10000 UNIT/ML IJ SOLN
INTRAMUSCULAR | Status: AC
  Filled 2018-04-26: qty 1

## 2018-04-26 NOTE — Telephone Encounter (Signed)
Copied from [place] [id number]. Topic: Quick Communication - See Telephone Encounter >> Apr 26, 2018  5:07 PM Rutherford Nail, NT wrote: CRM for notification. See Telephone encounter for: 04/26/18. Patient's daughter, Deatra Robinson, calling and is requesting a call back from [employer]. States that she has questions regarding patient's sodium levels and his medications. Requesting a call first thing in the morning. States that [facility] is requesting her to be at the visit, [name] would need to change her work schedule so she has questions concerning this. Please advise. CB#: [phone]

## 2018-04-26 NOTE — Telephone Encounter (Signed)
Copied from [place] [id number]. Topic: Quick Communication - See Telephone Encounter >> Apr 26, 2018  3:47 PM Bea Graff, NT wrote: CRM for notification. See Telephone encounter for: 04/26/18. Pts daughter [name] calling and would like a call with her dads lab results. She would like as soon possible due to he was in the er at [facility] last week and has terminal cancer and she wants to be sure her dad is ok. Please advise.

## 2018-04-27 ENCOUNTER — Encounter: Payer: Self-pay | Admitting: Nurse Practitioner

## 2018-04-27 NOTE — Telephone Encounter (Addendum)
Contacted pt dtr Gerald Jacobs) and informed that Losartan was discontinued and increased the amlodipine and to continue the metoprolol. Dtr stated understanding and she stated that she will be with him during the next appointment.   Can we enter labs to be done the morning of the 05/21/2018 (Cmet, CBC, Microalbumin and any other that you feel would be appropriate? Please advise.

## 2018-05-02 NOTE — Telephone Encounter (Signed)
Labs are ordered 

## 2018-05-03 ENCOUNTER — Ambulatory Visit (HOSPITAL_COMMUNITY)
Admission: RE | Admit: 2018-05-03 | Discharge: 2018-05-03 | Disposition: A | Discharge status: Home / Self Care | Payer: Medicare Other | Source: Ambulatory Visit | Attending: Nephrology | Admitting: Nephrology

## 2018-05-03 VITALS — BP 145/57 | HR 62 | Temp 98.0°F | Resp 20

## 2018-05-03 DIAGNOSIS — N184 Chronic kidney disease, stage 4 (severe): Secondary | ICD-10-CM | POA: Diagnosis present

## 2018-05-03 DIAGNOSIS — D631 Anemia in chronic kidney disease: Secondary | ICD-10-CM | POA: Insufficient documentation

## 2018-05-03 DIAGNOSIS — D649 Anemia, unspecified: Secondary | ICD-10-CM

## 2018-05-03 LAB — POCT HEMOGLOBIN-HEMACUE: HEMOGLOBIN: 10.9 g/dL — AB (ref 13.0–17.0)

## 2018-05-03 MED ORDER — EPOETIN ALFA 10000 UNIT/ML IJ SOLN
INTRAMUSCULAR | Status: AC
  Filled 2018-05-03: qty 1

## 2018-05-03 MED ORDER — EPOETIN ALFA 10000 UNIT/ML IJ SOLN
10000.0000 [IU] | INTRAMUSCULAR | Status: DC
  Administered 2018-05-03: 10000 [IU] via SUBCUTANEOUS

## 2018-05-03 NOTE — Telephone Encounter (Signed)
Sent message through my chart to let patients daughter know.

## 2018-05-10 ENCOUNTER — Encounter: Payer: Self-pay | Admitting: Cardiovascular Disease

## 2018-05-10 ENCOUNTER — Ambulatory Visit: Payer: Medicare Other | Admitting: Cardiovascular Disease

## 2018-05-10 ENCOUNTER — Ambulatory Visit (HOSPITAL_COMMUNITY)
Admission: RE | Admit: 2018-05-10 | Discharge: 2018-05-10 | Disposition: A | Discharge status: Home / Self Care | Payer: Medicare Other | Source: Ambulatory Visit | Attending: Nephrology | Admitting: Nephrology

## 2018-05-10 VITALS — BP 116/66 | HR 60 | Ht 67.0 in | Wt 142.1 lb

## 2018-05-10 VITALS — BP 180/64 | HR 68 | Temp 97.9°F | Resp 20

## 2018-05-10 DIAGNOSIS — D649 Anemia, unspecified: Secondary | ICD-10-CM

## 2018-05-10 DIAGNOSIS — D631 Anemia in chronic kidney disease: Secondary | ICD-10-CM | POA: Diagnosis not present

## 2018-05-10 DIAGNOSIS — N184 Chronic kidney disease, stage 4 (severe): Secondary | ICD-10-CM | POA: Insufficient documentation

## 2018-05-10 DIAGNOSIS — I10 Essential (primary) hypertension: Secondary | ICD-10-CM | POA: Diagnosis not present

## 2018-05-10 DIAGNOSIS — I493 Ventricular premature depolarization: Secondary | ICD-10-CM | POA: Diagnosis not present

## 2018-05-10 LAB — IRON AND TIBC
IRON: 54 ug/dL (ref 45–182)
SATURATION RATIOS: 14 % — AB (ref 17.9–39.5)
TIBC: 377 ug/dL (ref 250–450)
UIBC: 323 ug/dL

## 2018-05-10 LAB — FERRITIN: Ferritin: 25 ng/mL (ref 24–336)

## 2018-05-10 LAB — POCT HEMOGLOBIN-HEMACUE: Hemoglobin: 12.3 g/dL — ABNORMAL LOW (ref 13.0–17.0)

## 2018-05-10 MED ORDER — EPOETIN ALFA 10000 UNIT/ML IJ SOLN
INTRAMUSCULAR | Status: AC
  Filled 2018-05-10: qty 1

## 2018-05-10 MED ORDER — EPOETIN ALFA 10000 UNIT/ML IJ SOLN: 10000.0000 [IU] | INTRAMUSCULAR | Status: DC

## 2018-05-10 NOTE — Progress Notes (Signed)
CARDIOLOGY OFFICE NOTE  Date:  05/10/2018    Gerald Jacobs Date of Birth: [birth year] Medical Record #[id number]  PCP:  Lance Sell, NP  Cardiologist:    Genice Rouge   Chief Complaint  Patient presents with  . Hypertension   Problem List 1. Essential HTN 2. DM 3. CKD - sees [name],   baseline Cr = 1.94 4, Anemia - taking procrit  5. 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  6. PVCs    Notes from [place]:   Gerald Jacobs is a 84 y.o. male who presents today for a 4 month check. Former patient of Dr. Sherryl Barters.   He has a history of HTN, palpitations, dyslipidemia, diabetes, and mild renal insufficiency. Also has a history of hyperuricemia. He has anemia secondary to chronic renal disease and receives ejections of Procrit about every 3 weeks depending on his hemoglobin level. Dr. Posey Pronto is his nephrologist.   He was last seen back in November and felt to be doing well. Had his Medicare well check last month by PCP.   Comes back today. Here alone. Wife was to be seen also today - but having shoulder issues due to a fall and has cancelled. He is doing ok. No chest pain. He is trying to stay active but notes he has slowed down some. Has stable DOE. No falls. No passing out. Occasional palpitations but nothing that really worries him or produces symptoms. He is pleased with how he is doing. He has a brother who is alive at 80.   Oct. 6, 2017: This is the first time meeting [name] . He is a transfer from [place]. He has a history of hypertension and diabetes Is doing well. Has some shoulder issues Also goes to the [place] [facility]   Plays golf on Thursdays - limited by his shoulder pain  No longer does his yard work  No CP ,  Occasional dyspnea - also has some anemia  BP is a bit high today . Takes his BP at home and the reading are typically quite good.  Retired from [employer] - retired in 1989.    March 15, 2017: [name] is seen back today for follow up of his HTN. BP has been a little high  Not playing much golf recently .   Nov. 6, 2018:  Having some palpitations.   Has had these in the past.   Metoprolol was increased which solved the issue  Also has bile duct cancer - is under some additional stress due to this  Has lost 10-15 labs over the past 6 months .   Unable to eat much  No energy.   Has had anemia.  Unable to do any yard work now  May 10, 2018:  Was hospitalized at [facility] recently    For complications of bile duct cancer .   Has had previous stenting of his bile duct.   Has had them replaced several times.  Cancer has progressed slightly .     Past Medical History:  Diagnosis Date  . Bilateral shoulder pain 07/28/2016  . Cancer [facility])    prostate  . Chest pain 10/27/2016  . Chronic anemia    With normal iron studies and normal serum protein electrophoresis  . Coronary artery disease    Mild  . Cough 10/27/2016  . Diabetes mellitus    Adult onset  . Essential hypertension   .  GERD (gastroesophageal reflux disease)    occ tums  . History of gout   . Hyperbilirubinemia 06/15/2017  . Hyponatremia 06/15/2017  . Intermittent palpitations 07/23/2014  . Liver mass   . Mass of bile duct 06/27/2017  . Mild renal insufficiency   . Nocturia    x2  . Normocytic anemia 06/23/2016  . Osteoarthritis   . Sciatica of right side 07/28/2016  . Statin myopathy 07/28/2016  . Weight loss 06/15/2017    Past Surgical History:  Procedure Laterality Date  . CHOLECYSTECTOMY  02/04/2014   DR Zella Richer  . CHOLECYSTECTOMY N/A 02/04/2014   Procedure: LAPAROSCOPIC CHOLECYSTECTOMY with intraoperative cholangiogram;  Surgeon: Odis Hollingshead, MD;  Location: [place];  Service: General;  Laterality: N/A;  . ERCP N/A 10/15/2013   Procedure: ENDOSCOPIC RETROGRADE CHOLANGIOPANCREATOGRAPHY (ERCP);  Surgeon: Jeryl Columbia, MD;  Location: [facility];  Service: Endoscopy;  Laterality: N/A;  . KNEE  SURGERY Right 1979  . melanoma surgery  1970's   on scalp  . PROSTATE SURGERY  1993   Prostate Cancer  . SPHINCTEROTOMY  10/15/2013   Procedure: SPHINCTEROTOMY;  Surgeon: Jeryl Columbia, MD;  Location: [facility];  Service: Endoscopy;;     Medications: Current Outpatient Medications  Medication Sig Dispense Refill  . allopurinol (ZYLOPRIM) 100 MG tablet Take 1 tablet (100 mg total) by mouth daily. 90 tablet 0  . amLODipine (NORVASC) 5 MG tablet Take 1 tablet (5 mg total) by mouth daily. 30 tablet 1  . aspirin 81 MG tablet Take 81 mg by mouth daily.      . Biotin 1000 MCG tablet Take 1,000 mcg by mouth daily.    . cholecalciferol (VITAMIN D) 1000 units tablet Take 4,000 Units by mouth daily.     . feeding supplement, GLUCERNA SHAKE, (GLUCERNA SHAKE) LIQD Take 237 mLs by mouth 2 (two) times daily between meals. 237 mL 0  . metoprolol succinate (TOPROL-XL) 50 MG 24 hr tablet Take 1 tablet (50 mg total) by mouth every morning. 90 tablet 1  . ondansetron (ZOFRAN) 8 MG tablet Take by mouth every 8 (eight) hours as needed for nausea or vomiting.    . ONE TOUCH ULTRA TEST test strip USE ONE STRIP PER TEST. CHECK BLOOD SUGARS 1-4 TIMES PER DAY AS INSTRUCTED. 100 each 4  . ONETOUCH DELICA LANCETS 25D MISC TEST BLOOD SUGARS 1-4 TIMES A DAY AS INSTRUCTED. DX CODE: E11.9 100 each 2  . pantoprazole (PROTONIX) 40 MG tablet Take 40 mg by mouth daily.     . promethazine (PHENERGAN) 12.5 MG tablet Take 12.5 mg by mouth every 6 (six) hours as needed for nausea or vomiting.    . traZODone (DESYREL) 50 MG tablet Take 0.5-1 tablets (25-50 mg total) by mouth at bedtime as needed for sleep. 30 tablet 3   No current facility-administered medications for this visit.     Allergies: Allergies  Allergen Reactions  . Colchicine Other (See Comments)    GI problems  . Flexeril [Cyclobenzaprine Hcl] Other (See Comments)    GI problems   . Levaquin [Levofloxacin In D5w] Other (See Comments)    Insomnia   .  Percocet [Oxycodone-Acetaminophen] Nausea And Vomiting  . Zebeta Other (See Comments)    Gi problems    Social History: The patient  reports that he quit smoking about 38 years ago. His smoking use included cigarettes. He has a 45.00 pack-year smoking history. He has never used smokeless tobacco. He reports that he drinks alcohol. He reports  that he does not use drugs.   Family History: The patient's family history includes Stroke in his brother, brother, brother, father, mother, and sister.   Review of Systems: Please see the history of present illness.   Otherwise, the review of systems is positive for none.   All other systems are reviewed and negative.   Physical Exam: VS:  BP 116/66   Pulse 60   Ht 5\' 7"  (1.702 m)   Wt 142 lb 1.9 oz (64.5 kg)   SpO2 99%   BMI 22.26 kg/m  .  BMI Body mass index is 22.26 kg/m.  Wt Readings from Last 3 Encounters:  05/10/18 142 lb 1.9 oz (64.5 kg)  04/18/18 144 lb (65.3 kg)  02/26/18 144 lb (65.3 kg)    Physical Exam: Blood pressure 116/66, pulse 60, height 5\' 7"  (1.702 m), weight 142 lb 1.9 oz (64.5 kg), SpO2 99 %.  GEN:  Elderly , frail gentleman,  NAD  HEENT: Normal NECK: No JVD; No carotid bruits LYMPHATICS: No lymphadenopathy CARDIAC: RR, no murmurs, rubs, gallops RESPIRATORY:  Clear to auscultation without rales, wheezing or rhonchi  ABDOMEN: Soft, non-tender, non-distended MUSCULOSKELETAL:  No edema; No deformity  SKIN: Warm and dry NEUROLOGIC:  Alert and oriented x 3   LABORATORY DATA:  EKG:   May 10, 2018:   NSR at 62 .  Normal ECG   Lab Results  Component Value Date   WBC 8.2 04/26/2018   HGB 10.9 (L) 05/03/2018   HCT 35.0 (L) 04/26/2018   PLT 213.0 04/26/2018   GLUCOSE 208 (H) 04/26/2018   CHOL 195 02/08/2017   TRIG 154.0 (H) 02/08/2017   HDL 74.00 02/08/2017   LDLCALC 90 02/08/2017   ALT 18 04/26/2018   AST 12 04/26/2018   NA 129 (L) 04/26/2018   K 5.3 (H) 04/26/2018   CL 98 04/26/2018   CREATININE 2.37  (H) 04/26/2018   BUN 44 (H) 04/26/2018   CO2 24 04/26/2018   TSH 2.073 06/15/2017   PSA 0.01 (L) 02/08/2017   INR 1.06 06/19/2017   HGBA1C 6.4 04/18/2018   MICROALBUR 63.4 (H) 02/08/2017    BNP (last 3 results) No results for input(s): BNP in the last 8760 hours.  ProBNP (last 3 results) No results for input(s): PROBNP in the last 8760 hours.   Other Studies Reviewed Today:   Assessment/Plan: 1. Essential HTN :    BP is well controlled Amlodipine has been doubled.   Losartan has been stopped.  Doing well   2. PVCS:    Stable ,   3. CKD -   Stable   4. DM -    5. History of gout -     Current medicines are reviewed with the patient today.  The patient does not have concerns regarding medicines other than what has been noted above.  The following changes have been made:  See above.  Labs/ tests ordered today include:   No orders of the defined types were placed in this encounter.   Mertie Moores, MD  05/10/2018 10:34 AM    [facility] [address],  [address] [place] [place], [place]  [address] Pager [phone] Phone: [phone]; Fax: [phone]

## 2018-05-10 NOTE — Patient Instructions (Signed)
Medication Instructions:  Your physician recommends that you continue on your current medications as directed. Please refer to the Current Medication list given to you today.   Labwork: None Ordered   Testing/Procedures: None Ordered   Follow-Up: Your physician wants you to follow-up in: 1 year with Dr. Nahser.  You will receive a reminder letter in the mail two months in advance. If you don't receive a letter, please call our office to schedule the follow-up appointment.   If you need a refill on your cardiac medications before your next appointment, please call your pharmacy.   Thank you for choosing [facility]! Camarion Weier, RN [phone]    

## 2018-05-17 ENCOUNTER — Encounter (HOSPITAL_COMMUNITY): Payer: Medicare Other

## 2018-05-21 ENCOUNTER — Other Ambulatory Visit: Payer: Self-pay | Admitting: Nurse Practitioner

## 2018-05-21 ENCOUNTER — Ambulatory Visit: Payer: Medicare Other | Admitting: Nurse Practitioner

## 2018-05-21 ENCOUNTER — Encounter: Payer: Self-pay | Admitting: Nurse Practitioner

## 2018-05-21 ENCOUNTER — Other Ambulatory Visit (INDEPENDENT_AMBULATORY_CARE_PROVIDER_SITE_OTHER): Payer: Medicare Other

## 2018-05-21 VITALS — BP 160/72 | HR 65 | Temp 98.8°F | Resp 16 | Ht 67.0 in | Wt 141.0 lb

## 2018-05-21 DIAGNOSIS — I10 Essential (primary) hypertension: Secondary | ICD-10-CM

## 2018-05-21 DIAGNOSIS — E1122 Type 2 diabetes mellitus with diabetic chronic kidney disease: Secondary | ICD-10-CM

## 2018-05-21 DIAGNOSIS — R899 Unspecified abnormal finding in specimens from other organs, systems and tissues: Secondary | ICD-10-CM | POA: Diagnosis not present

## 2018-05-21 DIAGNOSIS — K838 Other specified diseases of biliary tract: Secondary | ICD-10-CM | POA: Diagnosis not present

## 2018-05-21 DIAGNOSIS — N184 Chronic kidney disease, stage 4 (severe): Secondary | ICD-10-CM

## 2018-05-21 LAB — COMPREHENSIVE METABOLIC PANEL
ALBUMIN: 3.9 g/dL (ref 3.5–5.2)
ALT: 10 U/L (ref 0–53)
AST: 14 U/L (ref 0–37)
Alkaline Phosphatase: 68 U/L (ref 39–117)
BUN: 35 mg/dL — AB (ref 6–23)
CHLORIDE: 104 meq/L (ref 96–112)
CO2: 24 meq/L (ref 19–32)
CREATININE: 2.54 mg/dL — AB (ref 0.40–1.50)
Calcium: 9.4 mg/dL (ref 8.4–10.5)
GFR: 25.41 mL/min — ABNORMAL LOW (ref >60.00)
Glucose, Bld: 164 mg/dL — ABNORMAL HIGH (ref 70–99)
POTASSIUM: 5.1 meq/L (ref 3.5–5.1)
SODIUM: 139 meq/L (ref 135–145)
Total Bilirubin: 0.4 mg/dL (ref 0.2–1.2)
Total Protein: 7.4 g/dL (ref 6.0–8.3)

## 2018-05-21 LAB — MICROALBUMIN / CREATININE URINE RATIO
CREATININE, U: 82.4 mg/dL
MICROALB UR: 92.6 mg/dL — AB (ref 0.0–1.9)
Microalb Creat Ratio: 112.4 mg/g — ABNORMAL HIGH (ref 0.0–30.0)

## 2018-05-21 LAB — CBC
HEMATOCRIT: 35.7 % — AB (ref 39.0–52.0)
Hemoglobin: 11.9 g/dL — ABNORMAL LOW (ref 13.0–17.0)
MCHC: 33.4 g/dL (ref 30.0–36.0)
MCV: 90.8 fl (ref 78.0–100.0)
Platelets: 164 10*3/uL (ref 150.0–400.0)
RBC: 3.93 Mil/uL — AB (ref 4.22–5.81)
RDW: 14.8 % (ref 11.5–15.5)
WBC: 7.6 10*3/uL (ref 4.0–10.5)

## 2018-05-21 NOTE — Progress Notes (Signed)
Name: KINSLER SOEDER   MRN: [id number]    DOB: 19-May-1925   Date:05/21/2018       Progress Note  Subjective  Chief Complaint  Chief Complaint  Patient presents with  . Follow-up    HPI Mr Holloway is here today for a routine follow up visit. We will review his diabetes and hypertension. He is accompanied by his daughter today, who is an oncology nurse. I requested her  to come today as she is an active part of his care and I wanted to have a discussion of our care goals for her dad. Aside from primary care, Mr Dy continues to follow up routinely with [facility] every 3 months for cholangiocarcinoma, Dr Posey Pronto nephology for stage IV CKD and anemia, Dr Acie Fredrickson annually cardiology for frequent PVCs and essential hypertension.   Cholangiocarcinoma-  Not curative per patient He is being monitored regularly by [facility] and plans to continue to active treatments for this condition His daughter requests monthly CBC and CMET in our office to monitor patients condition, she says she would like to stay ahead of any changes her dad is having, they don't want to drive to [facility] monthly for labs. The patient is agreeable to this plan.  Hypertension -maintained on amlodipine 5 BID, metoprolol succinate 50 daily. His Losartan was D/ced on 5/30, and amlodipine increased at 6/13 OV with cardiology. Reports daily medication compliance without noted adverse medication effects. Reports routinely checking BP readings at home, recent readings have been normal in the 130s /70s. Denies headaches, vision changes, chest pain, shortness of breath, edema.  BP Readings from Last 3 Encounters:  05/21/18 (!) 160/72  05/10/18 (!) 180/64  05/10/18 116/66   Diabetes- maintained off medications since January  Reports he continues to have good glucose readings at home, checking routinely, ranging from 90s-170s.  Denies tremor, diaphoresis, polyuria, polydipsia, polyphagia.  Lab Results  Component Value Date   HGBA1C 6.4 04/18/2018    Patient Active Problem List   Diagnosis Date Noted  . Mass of bile duct 06/27/2017  . Liver mass   . Hyperbilirubinemia 06/15/2017  . Essential hypertension 09/02/2016  . Statin myopathy 07/28/2016  . Normocytic anemia 06/23/2016  . Medicare annual wellness visit, subsequent 01/18/2016  . Routine general medical examination at a health care facility 01/18/2016  . Frequent PVCs 11/24/2014  . Intermittent palpitations 07/23/2014  . Chronic cholecystitis with calculus 02/04/2014  . Choledocholithiasis 08/28/2013  . Symptomatic cholelithiasis 06/04/2013  . Elevated LFTs 06/04/2013  . Benign hypertensive heart disease without heart failure 09/07/2011  . Type 2 diabetes mellitus with stage 4 chronic kidney disease, without long-term current use of insulin ([place]) 09/07/2011  . Dyslipidemia 09/07/2011  . CKD (chronic kidney disease), stage IV ([place]) 09/07/2011    Past Surgical History:  Procedure Laterality Date  . CHOLECYSTECTOMY  02/04/2014   DR Zella Richer  . CHOLECYSTECTOMY N/A 02/04/2014   Procedure: LAPAROSCOPIC CHOLECYSTECTOMY with intraoperative cholangiogram;  Surgeon: Odis Hollingshead, MD;  Location: [place];  Service: General;  Laterality: N/A;  . ERCP N/A 10/15/2013   Procedure: ENDOSCOPIC RETROGRADE CHOLANGIOPANCREATOGRAPHY (ERCP);  Surgeon: Jeryl Columbia, MD;  Location: [facility];  Service: Endoscopy;  Laterality: N/A;  . KNEE SURGERY Right 1979  . melanoma surgery  1970's   on scalp  . PROSTATE SURGERY  1993   Prostate Cancer  . SPHINCTEROTOMY  10/15/2013   Procedure: SPHINCTEROTOMY;  Surgeon: Jeryl Columbia, MD;  Location: [facility];  Service: Endoscopy;;    Family  History  Problem Relation Age of Onset  . Stroke Father   . Stroke Mother   . Stroke Sister   . Stroke Brother   . Stroke Brother   . Stroke Brother     Social History   Socioeconomic History  . Marital status: Married    Spouse name: Not on file  . Number of children:  2  . Years of education: 41  . Highest education level: Not on file  Occupational History  . Not on file  Social Needs  . Financial resource strain: Not hard at all  . Food insecurity:    Worry: Never true    Inability: Never true  . Transportation needs:    Medical: No    Non-medical: No  Tobacco Use  . Smoking status: Former Smoker    Packs/day: 1.00    Years: 45.00    Pack years: 45.00    Types: Cigarettes    Last attempt to quit: 09/07/1979    Years since quitting: 38.7  . Smokeless tobacco: Never Used  Substance and Sexual Activity  . Alcohol use: Yes    Comment: beer rarely  . Drug use: No  . Sexual activity: Not Currently  Lifestyle  . Physical activity:    Days per week: 0 days    Minutes per session: 0 min  . Stress: Not at all  Relationships  . Social connections:    Talks on phone: More than three times a week    Gets together: More than three times a week    Attends religious service: More than 4 times per year    Active member of club or organization: Yes    Attends meetings of clubs or organizations: More than 4 times per year    Relationship status: Married  . Intimate partner violence:    Fear of current or ex partner: No    Emotionally abused: No    Physically abused: No    Forced sexual activity: No  Other Topics Concern  . Not on file  Social History Narrative         Current Outpatient Medications:  .  allopurinol (ZYLOPRIM) 100 MG tablet, Take 1 tablet (100 mg total) by mouth daily., Disp: 90 tablet, Rfl: 0 .  amLODipine (NORVASC) 5 MG tablet, Take 5 mg by mouth 2 (two) times daily., Disp: , Rfl:  .  aspirin 81 MG tablet, Take 81 mg by mouth daily.  , Disp: , Rfl:  .  Biotin 1000 MCG tablet, Take 1,000 mcg by mouth daily., Disp: , Rfl:  .  cholecalciferol (VITAMIN D) 1000 units tablet, Take 4,000 Units by mouth daily. , Disp: , Rfl:  .  feeding supplement, GLUCERNA SHAKE, (GLUCERNA SHAKE) LIQD, Take 237 mLs by mouth 2 (two) times daily  between meals., Disp: 237 mL, Rfl: 0 .  metoprolol succinate (TOPROL-XL) 50 MG 24 hr tablet, Take 1 tablet (50 mg total) by mouth every morning., Disp: 90 tablet, Rfl: 1 .  ondansetron (ZOFRAN) 8 MG tablet, Take by mouth every 8 (eight) hours as needed for nausea or vomiting., Disp: , Rfl:  .  ONE TOUCH ULTRA TEST test strip, USE ONE STRIP PER TEST. CHECK BLOOD SUGARS 1-4 TIMES PER DAY AS INSTRUCTED., Disp: 100 each, Rfl: 4 .  ONETOUCH DELICA LANCETS 06T MISC, TEST BLOOD SUGARS 1-4 TIMES A DAY AS INSTRUCTED. DX CODE: E11.9, Disp: 100 each, Rfl: 2 .  pantoprazole (PROTONIX) 40 MG tablet, Take 40 mg by mouth daily. ,  Disp: , Rfl:  .  promethazine (PHENERGAN) 12.5 MG tablet, Take 12.5 mg by mouth every 6 (six) hours as needed for nausea or vomiting., Disp: , Rfl:  .  traZODone (DESYREL) 50 MG tablet, Take 0.5-1 tablets (25-50 mg total) by mouth at bedtime as needed for sleep., Disp: 30 tablet, Rfl: 3  Allergies  Allergen Reactions  . Colchicine Other (See Comments)    GI problems  . Flexeril [Cyclobenzaprine Hcl] Other (See Comments)    GI problems   . Levaquin [Levofloxacin In D5w] Other (See Comments)    Insomnia   . Percocet [Oxycodone-Acetaminophen] Nausea And Vomiting  . Zebeta Other (See Comments)    Gi problems     ROS See HPI  Objective  Vitals:   05/21/18 1100  BP: (!) 160/72  Pulse: 65  Resp: 16  Temp: 98.8 F (37.1 C)  TempSrc: Oral  SpO2: 98%  Weight: 141 lb (64 kg)  Height: 5\' 7"  (1.702 m)   Body mass index is 22.08 kg/m.  Physical Exam Vital signs reviewed. Constitutional: Patient appears well-developed and well-nourished. No distress.  HENT: Head: Normocephalic and atraumatic. Nose: Nose normal. Mouth/Throat: Oropharynx is clear and moist. Eyes: Conjunctivae and EOM are normal. Pupils are equal, round, and reactive to light. No scleral icterus.  Neck: Normal range of motion. Neck supple.  Cardiovascular: Normal rate, regular rhythm and normal heart  sounds.  No BLE edema. Distal pulses intact. Pulmonary/Chest: Effort normal and breath sounds normal. No respiratory distress. Abdomen: Soft, no distension. Neurological: he is alert and oriented to person, place, and time. Coordination, balance, strength, speech and gait are normal.  Skin: Skin is warm and dry. No rash noted. No erythema.  Psychiatric: Patient has a normal mood and affect. behavior is normal. Judgment and thought content normal.  Fall Risk: Fall Risk  02/26/2018 01/24/2017  Falls in the past year? No Yes  Number falls in past yr: - 1  Injury with Fall? - Yes    Assessment & Plan RTC around November for F/U: DM-recheck A1c CBC, CMET, microalbumin were ordered prior to visit today, collected this am

## 2018-05-21 NOTE — Assessment & Plan Note (Signed)
A1c in May was normal off medications Continue to monitor glucose readings at home Continue healthy diet and routine exercise RTC in about 5 months for follow up- recheck A1c

## 2018-05-21 NOTE — Assessment & Plan Note (Signed)
History of labile blood pressure readings, with reportedly normal recent home readings Recent evaluation for HTN by cardiology with medication adjustments, Will not make any additional adjustments today Continue to monitor home BP readings and F/U for readings >140/90

## 2018-05-21 NOTE — Patient Instructions (Signed)
I will plan to continue checking your blood counts and kidney and liver function labs monthly.  Please come back to see me in about 5- 6 months, around November, for routine diabetes follow up.  It was good to see you today. Thanks for letting me take care of you.

## 2018-05-21 NOTE — Assessment & Plan Note (Signed)
Continue routine F/U with [facility] Will check monthly CBC, CMET at patient and family request, to actively monitor the patients condition

## 2018-05-22 ENCOUNTER — Encounter: Payer: Self-pay | Admitting: Nurse Practitioner

## 2018-05-22 ENCOUNTER — Other Ambulatory Visit: Payer: Self-pay | Admitting: Nurse Practitioner

## 2018-05-22 DIAGNOSIS — R899 Unspecified abnormal finding in specimens from other organs, systems and tissues: Secondary | ICD-10-CM

## 2018-05-24 ENCOUNTER — Ambulatory Visit (HOSPITAL_COMMUNITY)
Admission: RE | Admit: 2018-05-24 | Discharge: 2018-05-24 | Disposition: A | Discharge status: Home / Self Care | Payer: Medicare Other | Source: Ambulatory Visit | Attending: Nephrology | Admitting: Nephrology

## 2018-05-24 VITALS — BP 160/62 | HR 66 | Temp 97.7°F | Ht 67.0 in | Wt 142.0 lb

## 2018-05-24 DIAGNOSIS — N184 Chronic kidney disease, stage 4 (severe): Secondary | ICD-10-CM | POA: Insufficient documentation

## 2018-05-24 DIAGNOSIS — D631 Anemia in chronic kidney disease: Secondary | ICD-10-CM | POA: Diagnosis present

## 2018-05-24 DIAGNOSIS — D649 Anemia, unspecified: Secondary | ICD-10-CM

## 2018-05-24 LAB — POCT HEMOGLOBIN-HEMACUE: Hemoglobin: 10.8 g/dL — ABNORMAL LOW (ref 13.0–17.0)

## 2018-05-24 MED ORDER — EPOETIN ALFA 10000 UNIT/ML IJ SOLN: 10000.0000 [IU] | INTRAMUSCULAR | Status: DC

## 2018-05-24 MED ORDER — EPOETIN ALFA 10000 UNIT/ML IJ SOLN
INTRAMUSCULAR | Status: AC
  Administered 2018-05-24: 10000 [IU]
  Filled 2018-05-24: qty 1

## 2018-06-01 ENCOUNTER — Other Ambulatory Visit: Payer: Self-pay | Admitting: Nurse Practitioner

## 2018-06-01 ENCOUNTER — Other Ambulatory Visit (HOSPITAL_COMMUNITY): Payer: Self-pay

## 2018-06-01 ENCOUNTER — Encounter (HOSPITAL_COMMUNITY): Payer: Medicare Other

## 2018-06-04 ENCOUNTER — Ambulatory Visit (HOSPITAL_COMMUNITY)
Admission: RE | Admit: 2018-06-04 | Discharge: 2018-06-04 | Disposition: A | Discharge status: Home / Self Care | Payer: Medicare Other | Source: Ambulatory Visit | Attending: Nephrology | Admitting: Nephrology

## 2018-06-04 VITALS — BP 154/65 | HR 69 | Temp 97.6°F

## 2018-06-04 DIAGNOSIS — N184 Chronic kidney disease, stage 4 (severe): Secondary | ICD-10-CM | POA: Diagnosis present

## 2018-06-04 DIAGNOSIS — D649 Anemia, unspecified: Secondary | ICD-10-CM

## 2018-06-04 DIAGNOSIS — D631 Anemia in chronic kidney disease: Secondary | ICD-10-CM | POA: Insufficient documentation

## 2018-06-04 LAB — POCT HEMOGLOBIN-HEMACUE: Hemoglobin: 11 g/dL — ABNORMAL LOW (ref 13.0–17.0)

## 2018-06-04 MED ORDER — EPOETIN ALFA 10000 UNIT/ML IJ SOLN: 10000.0000 [IU] | INTRAMUSCULAR | Status: DC

## 2018-06-04 MED ORDER — EPOETIN ALFA 10000 UNIT/ML IJ SOLN
INTRAMUSCULAR | Status: AC
  Administered 2018-06-04: 10000 [IU]
  Filled 2018-06-04: qty 1

## 2018-06-11 ENCOUNTER — Ambulatory Visit (HOSPITAL_COMMUNITY)
Admission: RE | Admit: 2018-06-11 | Discharge: 2018-06-11 | Disposition: A | Discharge status: Home / Self Care | Payer: Medicare Other | Source: Ambulatory Visit | Attending: Nephrology | Admitting: Nephrology

## 2018-06-11 VITALS — BP 145/59 | HR 66 | Temp 97.3°F | Resp 18

## 2018-06-11 DIAGNOSIS — D649 Anemia, unspecified: Secondary | ICD-10-CM | POA: Insufficient documentation

## 2018-06-11 LAB — POCT HEMOGLOBIN-HEMACUE: Hemoglobin: 10.8 g/dL — ABNORMAL LOW (ref 13.0–17.0)

## 2018-06-11 MED ORDER — EPOETIN ALFA 10000 UNIT/ML IJ SOLN
10000.0000 [IU] | INTRAMUSCULAR | Status: DC
  Administered 2018-06-11: 10000 [IU] via SUBCUTANEOUS

## 2018-06-11 MED ORDER — SODIUM CHLORIDE 0.9 % IV SOLN
510.0000 mg | INTRAVENOUS | Status: DC
  Administered 2018-06-11: 510 mg via INTRAVENOUS
  Filled 2018-06-11: qty 17

## 2018-06-11 MED ORDER — EPOETIN ALFA 10000 UNIT/ML IJ SOLN
INTRAMUSCULAR | Status: AC
  Filled 2018-06-11: qty 1

## 2018-06-12 IMAGING — CT CT CHEST W/O CM
2 of 4 series · 12 of 36 positions shown, 15 images · non-contrast
Comparison: CXR 01/18/2016, abdomen ultrasound 05/28/2013, chest CT
08/14/2008, MRI 07/23/2013

CLINICAL DATA: Generalized weakness, loss of appetite and nausea.
Chest pain for several months. History of hypertension, diabetes and
renal insufficiency.

EXAM:
CT CHEST, ABDOMEN AND PELVIS WITHOUT CONTRAST
TECHNIQUE: Multidetector CT imaging of the chest, abdomen and pelvis was
performed following the standard protocol without IV contrast.

[Series 3: cap wo 5.0 i31f 2 · axial · 0.82mm/px · z∈[+786,+1306]mm · 9 of 126 slices shown, 12 images]
[im 11/126  mediastinal]
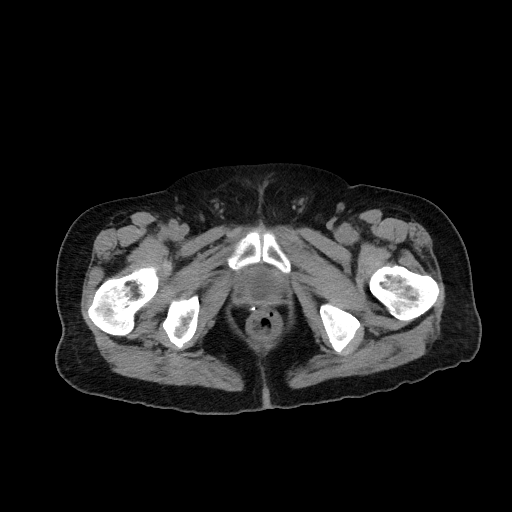
[im 11/126  lung]
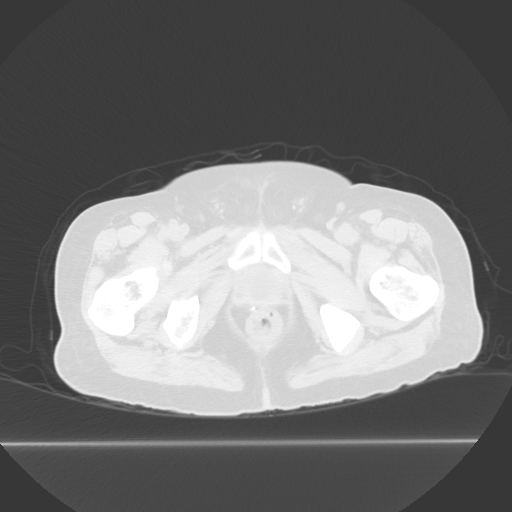
[im 21/126  lung]
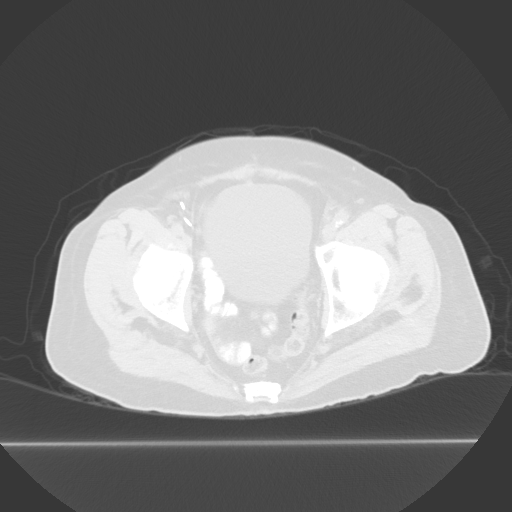
[im 42/126  lung]
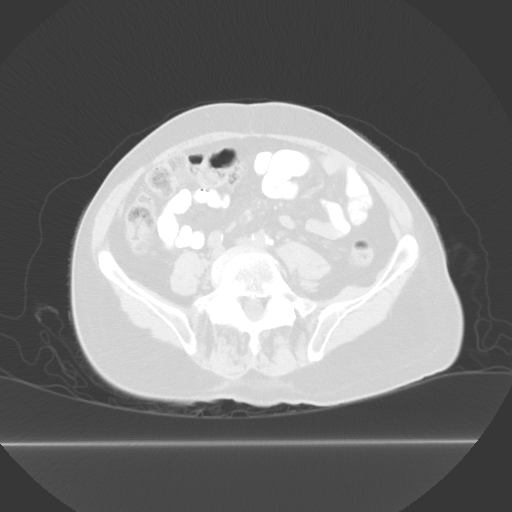
[im 53/126  lung]
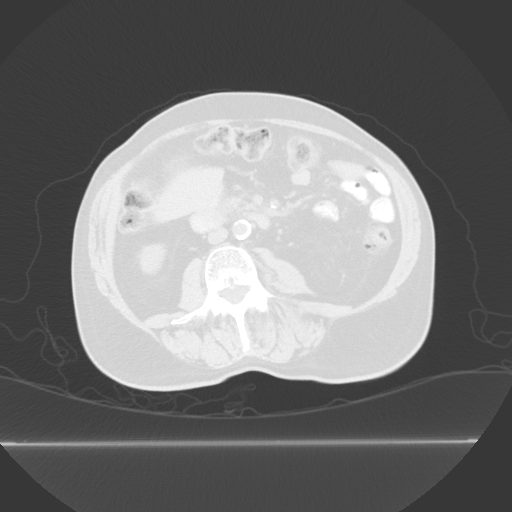
[im 63/126  mediastinal]
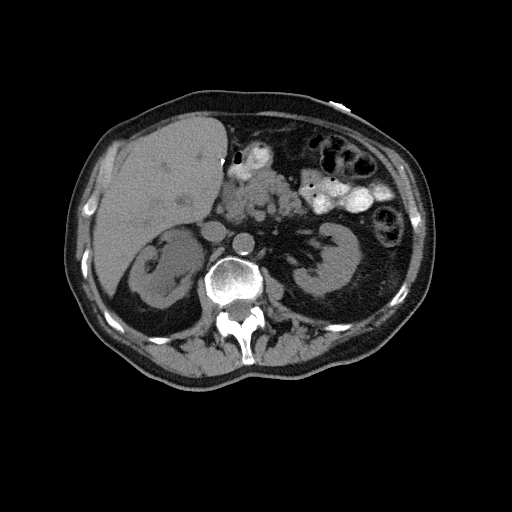
[im 63/126  lung]
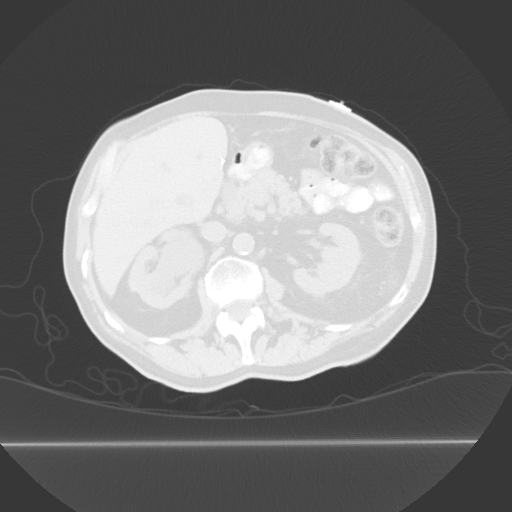
[im 73/126  lung]
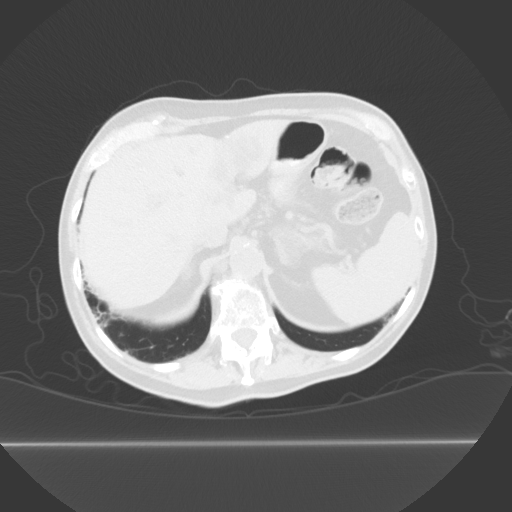
[im 84/126  lung]
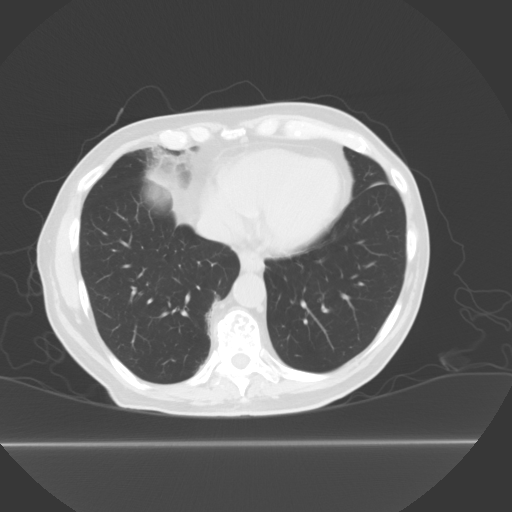
[im 105/126  lung]
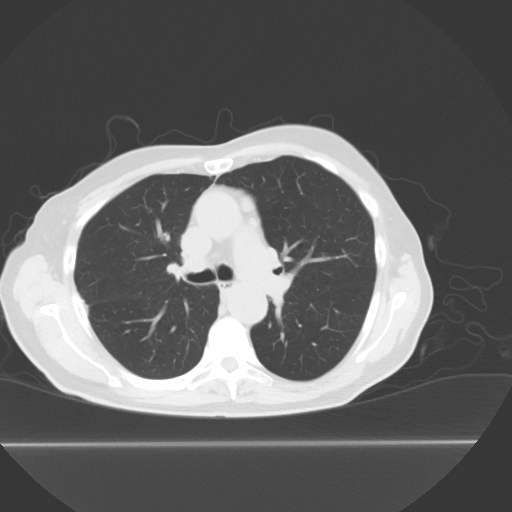
[im 115/126  mediastinal]
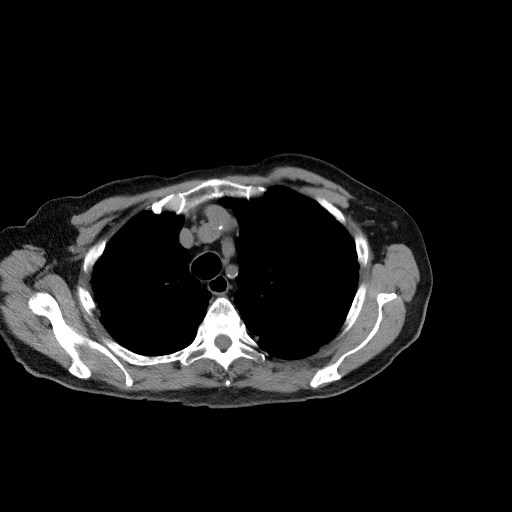
[im 115/126  lung]
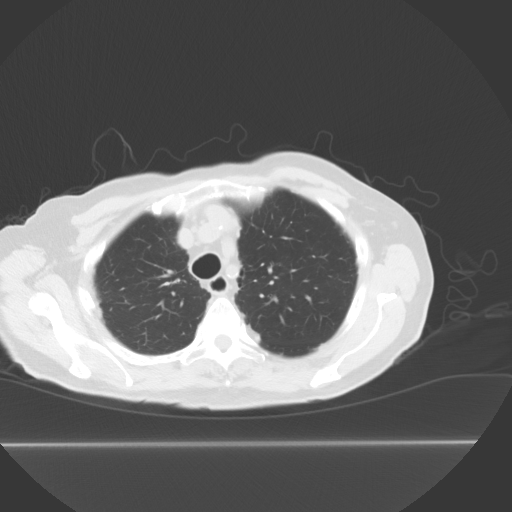

[Series 6: coronal · coronal · 0.72mm/px · 3 of 139 slices shown]
[im 28/139  lung]
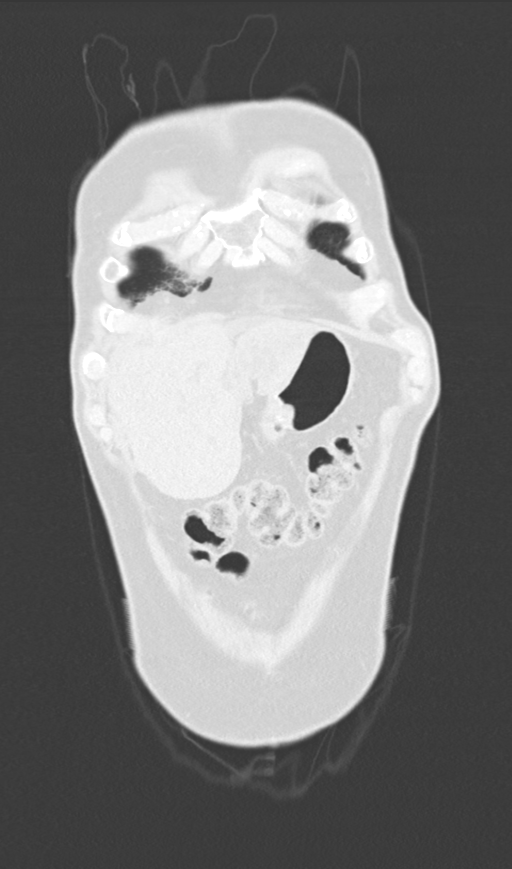
[im 56/139  lung]
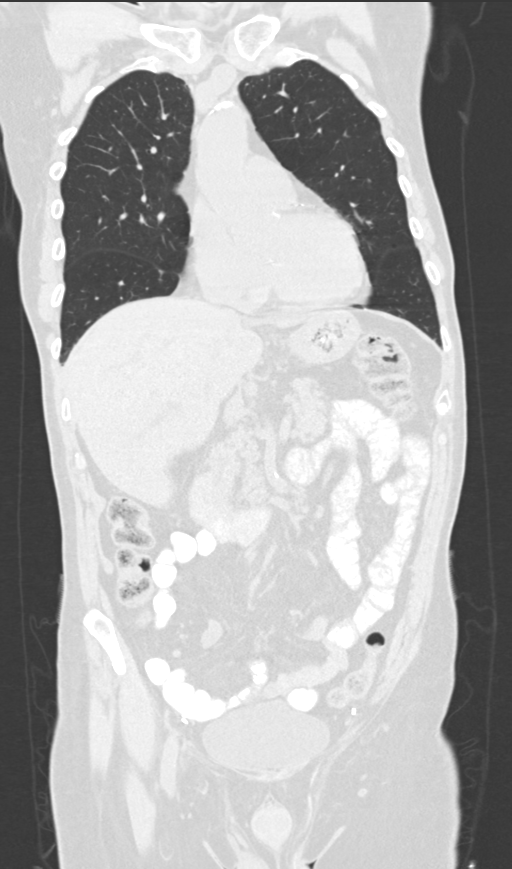
[im 83/139  lung]
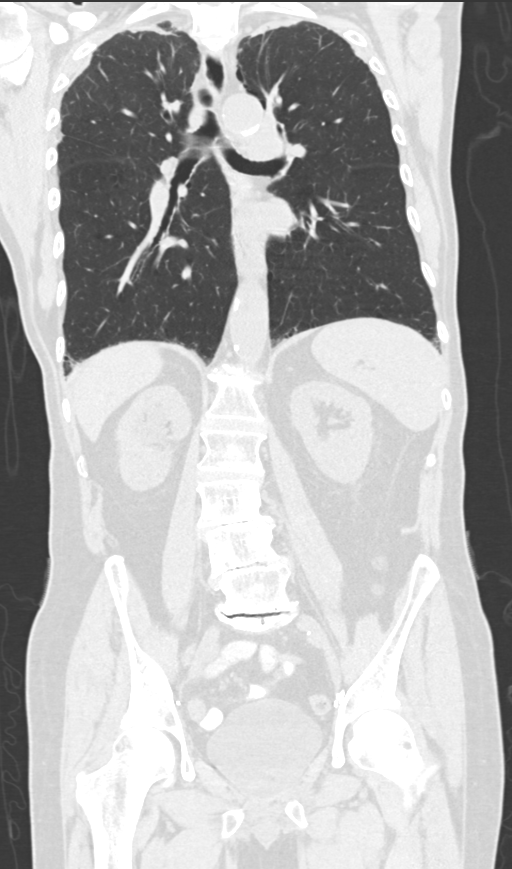

[12 of 36 positions shown; findings below may reference images not displayed]

FINDINGS: CT CHEST FINDINGS

Cardiovascular: The cardiac chambers are normal in size. No
pericardial effusion. There is coronary arteriosclerosis along the
LAD, circumflex and RCA. There is aortic atherosclerosis without
aneurysm. The unenhanced pulmonary artery is unremarkable without
main pulmonary arterial dilatation.

Mediastinum/Nodes: No enlarged mediastinal, hilar, or axillary lymph
nodes. Thyroid gland, trachea, and esophagus demonstrate no
significant findings. A tiny 2 mm cystic nodule of the left thyroid
gland is incidentally noted and stable dating back to 1333
consistent with a benign finding.

Lungs/Pleura: Chronic stable apical pleuroparenchymal scarring,
right greater than left with calcified and noncalcified pleural
plaque or thickening along the posterior aspect of the right
hemithorax. Fine interstitial changes along the periphery of the
left lower lobe may represent areas of minimal fibrosis with tiny
subpleural blebs at each lung base. Similar findings in the medial
right middle lobe. No effusion or pneumothorax. No dominant mass.

Musculoskeletal: No chest wall mass or suspicious bone lesions
identified. Degenerative changes are seen along the dorsal spine.

CT ABDOMEN PELVIS FINDINGS

Hepatobiliary: Status post cholecystectomy. Chronic atrophy of the
left hepatic lobe is again noted with chronic moderately dilated
intrahepatic ducts within the atrophic left hepatic lobe. Faint
calcifications are seen within the peripheral dilated ducts
suspicious for small intraductal calcifications. The degree of
intrahepatic dilatation is somewhat more pronounced than on prior
without definite etiology identified. The possibility of
intrahepatic strictures are not entirely excluded. A granuloma is
noted in the right hepatic lobe. Surgical clips are seen along the
margin of the left hepatic lobe.

Pancreas: Unremarkable. No pancreatic ductal dilatation or
surrounding inflammatory changes.

Spleen: Normal in size without focal abnormality.

Adrenals/Urinary Tract: Right renal collecting system. No
obstructive uropathy. 15 mm cyst in the upper pole of the left
kidney. Further characterization not possible given the noncontrast
study. Urinary bladder is physiologically distended.

Stomach/Bowel: Stomach is within normal limits. Appendix appears
normal. No evidence of bowel wall thickening, distention, or
inflammatory changes.

Vascular/Lymphatic: Aortic atherosclerosis. No enlarged abdominal or
pelvic lymph nodes. The splenic and portal vein can't be adequately
assessed due to the noncontrast study.

Reproductive: Prostatectomy with pelvic clips in place.

Other: No abdominal wall hernia or abnormality. No abdominopelvic
ascites.

Musculoskeletal: No acute nor suspicious osseous lesions.
Thoracolumbar spondylosis. No evidence of blastic disease.
IMPRESSION: 1. Three-vessel coronary arteriosclerosis.
2. Aortoiliac atherosclerosis without aneurysm.
3. Atrophy of the left hepatic lobe with chronic moderate
intrahepatic ductal dilatation slightly increased in prominence
since prior exam without etiology. Small strictures are not entirely
excluded. Consider repeat MRI of the abdomen. There are a few
punctate peripheral calcifications which may represent tiny stones
within the or adjacent to the biliary system. Right hepatic
granuloma is also noted.
4. Calcified and noncalcified pleural plaque along the posterior
aspect of the right thorax. Fine interstitial prominence may reflect
minimal fibrosis in the right middle lobe and left lower lobe with
several small bibasilar pulmonary blebs noted.
5. Cholecystectomy.
6. Prostatectomy without evidence of osteoblastic disease.
7. Chronic UPJ configuration of the right renal collecting system.
Probable 15 mm cyst left upper pole of the kidney.

## 2018-06-15 ENCOUNTER — Other Ambulatory Visit (HOSPITAL_COMMUNITY): Payer: Self-pay | Admitting: *Deleted

## 2018-06-18 ENCOUNTER — Ambulatory Visit (HOSPITAL_COMMUNITY)
Admission: RE | Admit: 2018-06-18 | Discharge: 2018-06-18 | Disposition: A | Discharge status: Home / Self Care | Payer: Medicare Other | Source: Ambulatory Visit | Attending: Nephrology | Admitting: Nephrology

## 2018-06-18 VITALS — BP 140/63 | HR 66 | Temp 97.9°F | Ht 67.0 in | Wt 140.0 lb

## 2018-06-18 DIAGNOSIS — D631 Anemia in chronic kidney disease: Secondary | ICD-10-CM | POA: Diagnosis present

## 2018-06-18 DIAGNOSIS — N189 Chronic kidney disease, unspecified: Secondary | ICD-10-CM | POA: Insufficient documentation

## 2018-06-18 DIAGNOSIS — D649 Anemia, unspecified: Secondary | ICD-10-CM

## 2018-06-18 MED ORDER — SODIUM CHLORIDE 0.9 % IV SOLN
510.0000 mg | INTRAVENOUS | Status: AC
  Administered 2018-06-18: 510 mg via INTRAVENOUS
  Filled 2018-06-18: qty 17

## 2018-06-18 MED ORDER — EPOETIN ALFA 10000 UNIT/ML IJ SOLN
INTRAMUSCULAR | Status: AC
  Administered 2018-06-18: 10000 [IU]
  Filled 2018-06-18: qty 1

## 2018-06-18 MED ORDER — EPOETIN ALFA 10000 UNIT/ML IJ SOLN: 10000.0000 [IU] | INTRAMUSCULAR | Status: DC

## 2018-06-19 LAB — POCT HEMOGLOBIN-HEMACUE: HEMOGLOBIN: 11.5 g/dL — AB (ref 13.0–17.0)

## 2018-06-25 ENCOUNTER — Ambulatory Visit (HOSPITAL_COMMUNITY)
Admission: RE | Admit: 2018-06-25 | Discharge: 2018-06-25 | Disposition: A | Discharge status: Home / Self Care | Payer: Medicare Other | Source: Ambulatory Visit | Attending: Nephrology | Admitting: Nephrology

## 2018-06-25 VITALS — BP 143/61 | HR 69 | Temp 98.2°F | Resp 15 | Ht 67.0 in | Wt 140.0 lb

## 2018-06-25 DIAGNOSIS — D649 Anemia, unspecified: Secondary | ICD-10-CM | POA: Diagnosis present

## 2018-06-25 LAB — IRON AND TIBC
IRON: 86 ug/dL (ref 45–182)
Saturation Ratios: 26 % (ref 17.9–39.5)
TIBC: 328 ug/dL (ref 250–450)
UIBC: 242 ug/dL

## 2018-06-25 LAB — POCT HEMOGLOBIN-HEMACUE: Hemoglobin: 12.5 g/dL — ABNORMAL LOW (ref 13.0–17.0)

## 2018-06-25 LAB — FERRITIN: Ferritin: 562 ng/mL — ABNORMAL HIGH (ref 24–336)

## 2018-06-25 MED ORDER — EPOETIN ALFA 10000 UNIT/ML IJ SOLN: 10000.0000 [IU] | INTRAMUSCULAR | Status: DC

## 2018-06-27 ENCOUNTER — Other Ambulatory Visit: Payer: Self-pay | Admitting: Nurse Practitioner

## 2018-07-02 ENCOUNTER — Encounter (HOSPITAL_COMMUNITY): Payer: Medicare Other

## 2018-07-09 ENCOUNTER — Ambulatory Visit (HOSPITAL_COMMUNITY)
Admission: RE | Admit: 2018-07-09 | Discharge: 2018-07-09 | Disposition: A | Discharge status: Home / Self Care | Payer: Medicare Other | Source: Ambulatory Visit | Attending: Nephrology | Admitting: Nephrology

## 2018-07-09 VITALS — BP 160/62 | HR 69 | Temp 97.7°F | Ht 67.0 in | Wt 142.0 lb

## 2018-07-09 DIAGNOSIS — D649 Anemia, unspecified: Secondary | ICD-10-CM

## 2018-07-09 DIAGNOSIS — D631 Anemia in chronic kidney disease: Secondary | ICD-10-CM | POA: Insufficient documentation

## 2018-07-09 DIAGNOSIS — N184 Chronic kidney disease, stage 4 (severe): Secondary | ICD-10-CM | POA: Diagnosis present

## 2018-07-09 LAB — POCT HEMOGLOBIN-HEMACUE: HEMOGLOBIN: 11.5 g/dL — AB (ref 13.0–17.0)

## 2018-07-09 MED ORDER — EPOETIN ALFA 10000 UNIT/ML IJ SOLN
10000.0000 [IU] | INTRAMUSCULAR | Status: DC
  Administered 2018-07-09: 10000 [IU] via SUBCUTANEOUS

## 2018-07-09 MED ORDER — EPOETIN ALFA 10000 UNIT/ML IJ SOLN
INTRAMUSCULAR | Status: AC
  Filled 2018-07-09: qty 1

## 2018-07-16 ENCOUNTER — Ambulatory Visit (HOSPITAL_COMMUNITY)
Admission: RE | Admit: 2018-07-16 | Discharge: 2018-07-16 | Disposition: A | Discharge status: Home / Self Care | Payer: Medicare Other | Source: Ambulatory Visit | Attending: Nephrology | Admitting: Nephrology

## 2018-07-16 VITALS — BP 156/57 | HR 63 | Temp 97.7°F

## 2018-07-16 DIAGNOSIS — D631 Anemia in chronic kidney disease: Secondary | ICD-10-CM | POA: Diagnosis not present

## 2018-07-16 DIAGNOSIS — D649 Anemia, unspecified: Secondary | ICD-10-CM

## 2018-07-16 DIAGNOSIS — N184 Chronic kidney disease, stage 4 (severe): Secondary | ICD-10-CM | POA: Diagnosis not present

## 2018-07-16 LAB — POCT HEMOGLOBIN-HEMACUE: HEMOGLOBIN: 11.4 g/dL — AB (ref 13.0–17.0)

## 2018-07-16 MED ORDER — EPOETIN ALFA 10000 UNIT/ML IJ SOLN
10000.0000 [IU] | INTRAMUSCULAR | Status: DC
  Administered 2018-07-16: 10000 [IU] via SUBCUTANEOUS

## 2018-07-16 MED ORDER — EPOETIN ALFA 10000 UNIT/ML IJ SOLN
INTRAMUSCULAR | Status: AC
  Administered 2018-07-16: 10000 [IU] via SUBCUTANEOUS
  Filled 2018-07-16: qty 1

## 2018-07-24 ENCOUNTER — Other Ambulatory Visit: Payer: Self-pay | Admitting: Nurse Practitioner

## 2018-08-01 ENCOUNTER — Ambulatory Visit (HOSPITAL_COMMUNITY)
Admission: RE | Admit: 2018-08-01 | Discharge: 2018-08-01 | Disposition: A | Discharge status: Home / Self Care | Payer: Medicare Other | Source: Ambulatory Visit | Attending: Nephrology | Admitting: Nephrology

## 2018-08-01 ENCOUNTER — Telehealth: Payer: Self-pay | Admitting: Family

## 2018-08-01 VITALS — BP 153/55 | HR 70 | Temp 97.8°F | Resp 20

## 2018-08-01 DIAGNOSIS — E119 Type 2 diabetes mellitus without complications: Secondary | ICD-10-CM

## 2018-08-01 DIAGNOSIS — C221 Intrahepatic bile duct carcinoma: Secondary | ICD-10-CM | POA: Diagnosis not present

## 2018-08-01 DIAGNOSIS — D649 Anemia, unspecified: Secondary | ICD-10-CM | POA: Insufficient documentation

## 2018-08-01 LAB — IRON AND TIBC
IRON: 132 ug/dL (ref 45–182)
SATURATION RATIOS: 45 % — AB (ref 17.9–39.5)
TIBC: 293 ug/dL (ref 250–450)
UIBC: 161 ug/dL

## 2018-08-01 LAB — FERRITIN: Ferritin: 244 ng/mL (ref 24–336)

## 2018-08-01 LAB — POCT HEMOGLOBIN-HEMACUE: HEMOGLOBIN: 11.9 g/dL — AB (ref 13.0–17.0)

## 2018-08-01 MED ORDER — ALLOPURINOL 100 MG PO TABS: 100.0000 mg | ORAL_TABLET | Freq: Every day | ORAL | 0 refills | Status: DC

## 2018-08-01 MED ORDER — ONETOUCH DELICA LANCETS 33G MISC: 2 refills | Status: DC

## 2018-08-01 MED ORDER — EPOETIN ALFA 10000 UNIT/ML IJ SOLN
INTRAMUSCULAR | Status: AC
  Filled 2018-08-01: qty 1

## 2018-08-01 MED ORDER — EPOETIN ALFA 10000 UNIT/ML IJ SOLN
10000.0000 [IU] | INTRAMUSCULAR | Status: DC
  Administered 2018-08-01: 10000 [IU] via SUBCUTANEOUS

## 2018-08-01 MED ORDER — GLUCOSE BLOOD VI STRP: ORAL_STRIP | 4 refills | Status: DC

## 2018-08-01 NOTE — Telephone Encounter (Addendum)
Marzetta Board, can we fill these meds?  This was sent to [facility] due to patients not updating his PCP with his pharmacy.

## 2018-08-01 NOTE — Telephone Encounter (Signed)
Refills sent. See meds.  

## 2018-08-01 NOTE — Addendum Note (Signed)
Addended by: Cresenciano Lick on: 08/01/2018 03:36 PM   Modules accepted: Orders

## 2018-08-02 ENCOUNTER — Other Ambulatory Visit: Payer: Self-pay | Admitting: Nurse Practitioner

## 2018-08-02 DIAGNOSIS — E119 Type 2 diabetes mellitus without complications: Secondary | ICD-10-CM

## 2018-08-06 ENCOUNTER — Ambulatory Visit: Payer: Medicare Other | Admitting: Family

## 2018-08-06 ENCOUNTER — Encounter: Payer: Self-pay | Admitting: Family

## 2018-08-06 VITALS — BP 148/70 | HR 67 | Temp 97.9°F | Ht 67.0 in | Wt 146.0 lb

## 2018-08-06 DIAGNOSIS — H60502 Unspecified acute noninfective otitis externa, left ear: Secondary | ICD-10-CM | POA: Diagnosis not present

## 2018-08-06 MED ORDER — NEOMYCIN-POLYMYXIN-HC 3.5-10000-1 OT SOLN: 3.0000 [drp] | Freq: Three times a day (TID) | OTIC | 0 refills | Status: DC

## 2018-08-06 NOTE — Progress Notes (Signed)
Gerald Jacobs is a 84 y.o. male with the following history as recorded in [employer]:  Patient Active Problem List   Diagnosis Date Noted  . Mass of bile duct 06/27/2017  . Liver mass   . Hyperbilirubinemia 06/15/2017  . Essential hypertension 09/02/2016  . Statin myopathy 07/28/2016  . Normocytic anemia 06/23/2016  . Medicare annual wellness visit, subsequent 01/18/2016  . Routine general medical examination at a health care facility 01/18/2016  . Frequent PVCs 11/24/2014  . Intermittent palpitations 07/23/2014  . Chronic cholecystitis with calculus 02/04/2014  . Choledocholithiasis 08/28/2013  . Symptomatic cholelithiasis 06/04/2013  . Elevated LFTs 06/04/2013  . Benign hypertensive heart disease without heart failure 09/07/2011  . Type 2 diabetes mellitus with stage 4 chronic kidney disease, without long-term current use of insulin ([place]) 09/07/2011  . Dyslipidemia 09/07/2011  . CKD (chronic kidney disease), stage IV ([place]) 09/07/2011    Current Outpatient Medications  Medication Sig Dispense Refill  . allopurinol (ZYLOPRIM) 100 MG tablet Take 1 tablet (100 mg total) by mouth daily. 90 tablet 0  . amLODipine (NORVASC) 5 MG tablet TAKE 1 TABLET BY MOUTH EVERY DAY 30 tablet 3  . aspirin 81 MG tablet Take 81 mg by mouth daily.      . Biotin 1000 MCG tablet Take 1,000 mcg by mouth daily.    . cholecalciferol (VITAMIN D) 1000 units tablet Take 4,000 Units by mouth daily.     . feeding supplement, GLUCERNA SHAKE, (GLUCERNA SHAKE) LIQD Take 237 mLs by mouth 2 (two) times daily between meals. 237 mL 0  . glucose blood (ONE TOUCH ULTRA TEST) test strip USE ONE STRIP PER TEST. CHECK BLOOD SUGARS 1-4 TIMES PER DAY AS INSTRUCTED. 100 each 4  . losartan (COZAAR) 50 MG tablet Take 50 mg by mouth daily.  1  . metoprolol succinate (TOPROL-XL) 50 MG 24 hr tablet Take 1 tablet (50 mg total) by mouth every morning. 90 tablet 1  . neomycin-polymyxin-hydrocortisone (CORTISPORIN) OTIC solution Place 3  drops into the left ear 3 (three) times daily. 10 mL 0  . ondansetron (ZOFRAN) 8 MG tablet Take by mouth every 8 (eight) hours as needed for nausea or vomiting.    Glory Rosebush DELICA LANCETS 89Q MISC TEST BLOOD SUGARS 1-4 TIMES A DAY AS INSTRUCTED. DX CODE: E11.9 100 each 2  . pantoprazole (PROTONIX) 40 MG tablet Take 40 mg by mouth daily.     . promethazine (PHENERGAN) 12.5 MG tablet Take 12.5 mg by mouth every 6 (six) hours as needed for nausea or vomiting.    . traZODone (DESYREL) 50 MG tablet Take 0.5-1 tablets (25-50 mg total) by mouth at bedtime as needed for sleep. 30 tablet 3  . triamcinolone (KENALOG) 0.025 % cream Apply topically.    . triamcinolone (KENALOG) 0.1 % paste Apply topically.     No current facility-administered medications for this visit.     Allergies: Colchicine; Flexeril [cyclobenzaprine hcl]; Levaquin [levofloxacin in d5w]; Percocet [oxycodone-acetaminophen]; and Zebeta  Past Medical History:  Diagnosis Date  . Bilateral shoulder pain 07/28/2016  . Cancer [facility])    prostate  . Chest pain 10/27/2016  . Chronic anemia    With normal iron studies and normal serum protein electrophoresis  . Coronary artery disease    Mild  . Cough 10/27/2016  . Diabetes mellitus    Adult onset  . Essential hypertension   . GERD (gastroesophageal reflux disease)    occ tums  . History of gout   . Hyperbilirubinemia  06/15/2017  . Hyponatremia 06/15/2017  . Intermittent palpitations 07/23/2014  . Liver mass   . Mass of bile duct 06/27/2017  . Mild renal insufficiency   . Nocturia    x2  . Normocytic anemia 06/23/2016  . Osteoarthritis   . Sciatica of right side 07/28/2016  . Statin myopathy 07/28/2016  . Weight loss 06/15/2017    Past Surgical History:  Procedure Laterality Date  . CHOLECYSTECTOMY  02/04/2014   DR Zella Richer  . CHOLECYSTECTOMY N/A 02/04/2014   Procedure: LAPAROSCOPIC CHOLECYSTECTOMY with intraoperative cholangiogram;  Surgeon: Odis Hollingshead, MD;  Location:  [place];  Service: General;  Laterality: N/A;  . ERCP N/A 10/15/2013   Procedure: ENDOSCOPIC RETROGRADE CHOLANGIOPANCREATOGRAPHY (ERCP);  Surgeon: Jeryl Columbia, MD;  Location: [facility];  Service: Endoscopy;  Laterality: N/A;  . KNEE SURGERY Right 1979  . melanoma surgery  1970's   on scalp  . PROSTATE SURGERY  1993   Prostate Cancer  . SPHINCTEROTOMY  10/15/2013   Procedure: SPHINCTEROTOMY;  Surgeon: Jeryl Columbia, MD;  Location: [facility];  Service: Endoscopy;;    Family History  Problem Relation Age of Onset  . Stroke Father   . Stroke Mother   . Stroke Sister   . Stroke Brother   . Stroke Brother   . Stroke Brother     Social History   Tobacco Use  . Smoking status: Former Smoker    Packs/day: 1.00    Years: 45.00    Pack years: 45.00    Types: Cigarettes    Last attempt to quit: 09/07/1979    Years since quitting: 38.9  . Smokeless tobacco: Never Used  Substance Use Topics  . Alcohol use: Yes    Comment: beer rarely    Subjective:  Patient presents with 1 week history of left ear pain; feels like ear canal is swollen; feels like pain is radiating down into left side of neck; no fever, no hearing loss; no recent travel on air plane;   Objective:  Vitals:   08/06/18 1129  BP: (!) 148/70  Pulse: 67  Temp: 97.9 F (36.6 C)  TempSrc: Oral  SpO2: 99%  Weight: 146 lb 0.6 oz (66.2 kg)  Height: 5\' 7"  (1.702 m)    General: Well developed, well nourished, in no acute distress  Skin : Warm and dry.  Head: Normocephalic and atraumatic  Eyes: Sclera and conjunctiva clear; pupils round and reactive to light; extraocular movements intact  Ears: External normal; canals erythematous, swollen;  tympanic membranes normal  Oropharynx: Pink, supple. No suspicious lesions  Neck: Supple without thyromegaly, adenopathy  Lungs: Respirations unlabored;  Neurologic: Alert and oriented; speech intact; face symmetrical; moves all extremities well; CNII-XII intact without focal  deficit   Assessment:  1. Acute otitis externa of left ear, unspecified type     Plan:  Rx for Cortisporin Otic- use as directed; follow-up worse, no better.    No follow-ups on file.  No orders of the defined types were placed in this encounter.   Requested Prescriptions   Signed Prescriptions Disp Refills  . neomycin-polymyxin-hydrocortisone (CORTISPORIN) OTIC solution 10 mL 0    Sig: Place 3 drops into the left ear 3 (three) times daily.

## 2018-08-06 NOTE — Patient Instructions (Signed)
Otitis Externa Otitis externa is an infection of the outer ear canal. The outer ear canal is the area between the outside of the ear and the eardrum. Otitis externa is sometimes called "swimmer's ear." Follow these instructions at home:  If you were given antibiotic ear drops, use them as told by your doctor. Do not stop using them even if your condition gets better.  Take over-the-counter and prescription medicines only as told by your doctor.  Keep all follow-up visits as told by your doctor. This is important. How is this prevented?  Keep your ear dry. Use the corner of a towel to dry your ear after you swim or bathe.  Try not to scratch or put things in your ear. Doing these things makes it easier for germs to grow in your ear.  Avoid swimming in lakes, dirty water, or pools that may not have the right amount of a chemical called chlorine.  Consider making ear drops and putting 3 or 4 drops in each ear after you swim. Ask your doctor about how you can make ear drops. Contact a doctor if:  You have a fever.  After 3 days your ear is still red, swollen, or painful.  After 3 days you still have pus coming from your ear.  Your redness, swelling, or pain gets worse.  You have a really bad headache.  You have redness, swelling, pain, or tenderness behind your ear. This information is not intended to replace advice given to you by your health care provider. Make sure you discuss any questions you have with your health care provider. Document Released: 05/02/2008 Document Revised: 12/10/2015 Document Reviewed: 08/24/2015 Elsevier Interactive Patient Education  2018 [employer] Inc[employer]  

## 2018-08-08 ENCOUNTER — Encounter (HOSPITAL_COMMUNITY)
Admission: RE | Admit: 2018-08-08 | Discharge: 2018-08-08 | Disposition: A | Discharge status: Home / Self Care | Payer: Medicare Other | Source: Ambulatory Visit | Attending: Nephrology | Admitting: Nephrology

## 2018-08-08 VITALS — BP 149/65 | HR 69 | Temp 98.1°F | Resp 20

## 2018-08-08 DIAGNOSIS — D631 Anemia in chronic kidney disease: Secondary | ICD-10-CM | POA: Diagnosis not present

## 2018-08-08 DIAGNOSIS — Z79899 Other long term (current) drug therapy: Secondary | ICD-10-CM | POA: Diagnosis not present

## 2018-08-08 DIAGNOSIS — N184 Chronic kidney disease, stage 4 (severe): Secondary | ICD-10-CM | POA: Insufficient documentation

## 2018-08-08 DIAGNOSIS — Z5181 Encounter for therapeutic drug level monitoring: Secondary | ICD-10-CM | POA: Insufficient documentation

## 2018-08-08 DIAGNOSIS — D649 Anemia, unspecified: Secondary | ICD-10-CM

## 2018-08-08 LAB — POCT HEMOGLOBIN-HEMACUE: HEMOGLOBIN: 11.9 g/dL — AB (ref 13.0–17.0)

## 2018-08-08 MED ORDER — EPOETIN ALFA 10000 UNIT/ML IJ SOLN
10000.0000 [IU] | INTRAMUSCULAR | Status: DC
  Administered 2018-08-08: 10000 [IU] via SUBCUTANEOUS

## 2018-08-08 MED ORDER — EPOETIN ALFA 10000 UNIT/ML IJ SOLN
INTRAMUSCULAR | Status: AC
  Filled 2018-08-08: qty 1

## 2018-08-09 ENCOUNTER — Telehealth: Payer: Self-pay | Admitting: Nurse Practitioner

## 2018-08-09 MED ORDER — AMOXICILLIN-POT CLAVULANATE 875-125 MG PO TABS: 1.0000 | ORAL_TABLET | Freq: Two times a day (BID) | ORAL | 0 refills | Status: DC

## 2018-08-09 NOTE — Telephone Encounter (Signed)
Spoke with patient and info given 

## 2018-08-09 NOTE — Telephone Encounter (Signed)
Copied from [place] [id number]. Topic: Quick Communication - See Telephone Encounter >> Aug 09, 2018 10:29 AM Ivar Drape wrote: CRM for notification. See Telephone encounter for: 08/09/18. Patient was in to see the provider about left ear pain on Mon, 08/06/18 and was given ear drops and was told if his condition was not better in a couple of days to inform the provider because he might need of antibiotics. The patient stated that it is a little better but he is still experiencing throbbing pain that comes and goes. Please advise.

## 2018-08-09 NOTE — Telephone Encounter (Signed)
Will send in oral antibiotic for him; if pain still persists after trying antibiotics, please follow up;

## 2018-08-13 ENCOUNTER — Encounter: Payer: Self-pay | Admitting: Nurse Practitioner

## 2018-08-13 ENCOUNTER — Other Ambulatory Visit: Payer: Self-pay | Admitting: Family

## 2018-08-13 ENCOUNTER — Telehealth: Payer: Self-pay | Admitting: Family

## 2018-08-13 NOTE — Telephone Encounter (Signed)
Let's just hold antibiotics for now; I am glad to hear he is feeling better.

## 2018-08-13 NOTE — Telephone Encounter (Signed)
Spoke with patient and info given 

## 2018-08-13 NOTE — Telephone Encounter (Signed)
Can you call and check on him please? His daughter let us know the antibiotic was making him sick. How is the ear?

## 2018-08-13 NOTE — Telephone Encounter (Signed)
Spoke with patient and he states his ear is much better today. He stopped the antibiotic on Saturday. He still has a few drops left for his ear. He wanted to know if you wanted to call him in anything else or if he was fine with not having any antibiotics.  Please advise.  Thanks

## 2018-08-14 ENCOUNTER — Encounter: Payer: Self-pay | Admitting: Family

## 2018-08-15 ENCOUNTER — Ambulatory Visit: Payer: Medicare Other | Admitting: Nurse Practitioner

## 2018-08-15 ENCOUNTER — Ambulatory Visit (HOSPITAL_COMMUNITY)
Admission: RE | Admit: 2018-08-15 | Discharge: 2018-08-15 | Disposition: A | Discharge status: Home / Self Care | Payer: Medicare Other | Source: Ambulatory Visit | Attending: Nephrology | Admitting: Nephrology

## 2018-08-15 ENCOUNTER — Other Ambulatory Visit (INDEPENDENT_AMBULATORY_CARE_PROVIDER_SITE_OTHER): Payer: Medicare Other

## 2018-08-15 ENCOUNTER — Other Ambulatory Visit: Payer: Self-pay | Admitting: Family

## 2018-08-15 ENCOUNTER — Encounter: Payer: Self-pay | Admitting: Nurse Practitioner

## 2018-08-15 ENCOUNTER — Encounter: Payer: Self-pay | Admitting: Family

## 2018-08-15 VITALS — BP 160/65 | HR 66 | Temp 97.9°F | Resp 20

## 2018-08-15 VITALS — BP 150/60 | HR 66 | Ht 67.0 in | Wt 145.0 lb

## 2018-08-15 DIAGNOSIS — R197 Diarrhea, unspecified: Secondary | ICD-10-CM | POA: Diagnosis not present

## 2018-08-15 DIAGNOSIS — K838 Other specified diseases of biliary tract: Secondary | ICD-10-CM

## 2018-08-15 DIAGNOSIS — N184 Chronic kidney disease, stage 4 (severe): Secondary | ICD-10-CM

## 2018-08-15 DIAGNOSIS — D631 Anemia in chronic kidney disease: Secondary | ICD-10-CM | POA: Diagnosis not present

## 2018-08-15 DIAGNOSIS — D649 Anemia, unspecified: Secondary | ICD-10-CM

## 2018-08-15 DIAGNOSIS — R809 Proteinuria, unspecified: Secondary | ICD-10-CM

## 2018-08-15 LAB — COMPREHENSIVE METABOLIC PANEL
ALBUMIN: 4 g/dL (ref 3.5–5.2)
ALT: 13 U/L (ref 0–53)
AST: 19 U/L (ref 0–37)
Alkaline Phosphatase: 79 U/L (ref 39–117)
BUN: 35 mg/dL — AB (ref 6–23)
CHLORIDE: 99 meq/L (ref 96–112)
CO2: 25 mEq/L (ref 19–32)
Calcium: 9.2 mg/dL (ref 8.4–10.5)
Creatinine, Ser: 2.45 mg/dL — ABNORMAL HIGH (ref 0.40–1.50)
GFR: 26.48 mL/min — ABNORMAL LOW (ref >60.00)
Glucose, Bld: 180 mg/dL — ABNORMAL HIGH (ref 70–99)
Potassium: 4.3 mEq/L (ref 3.5–5.1)
SODIUM: 132 meq/L — AB (ref 135–145)
Total Bilirubin: 0.6 mg/dL (ref 0.2–1.2)
Total Protein: 7.7 g/dL (ref 6.0–8.3)

## 2018-08-15 LAB — URINALYSIS, ROUTINE W REFLEX MICROSCOPIC
Bilirubin Urine: NEGATIVE
Ketones, ur: NEGATIVE
Leukocytes, UA: NEGATIVE
Nitrite: NEGATIVE
PH: 6 (ref 5.0–8.0)
RBC / HPF: NONE SEEN (ref 0–?)
Total Protein, Urine: 100 — AB
Urine Glucose: NEGATIVE
Urobilinogen, UA: 0.2 (ref 0.0–1.0)
WBC UA: NONE SEEN (ref 0–?)

## 2018-08-15 LAB — HEPATIC FUNCTION PANEL
ALBUMIN: 4 g/dL (ref 3.5–5.2)
ALT: 13 U/L (ref 0–53)
AST: 19 U/L (ref 0–37)
Alkaline Phosphatase: 79 U/L (ref 39–117)
BILIRUBIN TOTAL: 0.6 mg/dL (ref 0.2–1.2)
Bilirubin, Direct: 0.1 mg/dL (ref 0.0–0.3)
Total Protein: 7.7 g/dL (ref 6.0–8.3)

## 2018-08-15 LAB — CBC WITH DIFFERENTIAL/PLATELET
BASOS PCT: 0.5 % (ref 0.0–3.0)
Basophils Absolute: 0 10*3/uL (ref 0.0–0.1)
EOS PCT: 3 % (ref 0.0–5.0)
Eosinophils Absolute: 0.3 10*3/uL (ref 0.0–0.7)
HCT: 36.1 % — ABNORMAL LOW (ref 39.0–52.0)
HEMOGLOBIN: 12.3 g/dL — AB (ref 13.0–17.0)
Lymphocytes Relative: 24.4 % (ref 12.0–46.0)
Lymphs Abs: 2.2 10*3/uL (ref 0.7–4.0)
MCHC: 34.1 g/dL (ref 30.0–36.0)
MCV: 93.7 fl (ref 78.0–100.0)
MONO ABS: 1 10*3/uL (ref 0.1–1.0)
Monocytes Relative: 11.5 % (ref 3.0–12.0)
Neutro Abs: 5.4 10*3/uL (ref 1.4–7.7)
Neutrophils Relative %: 60.6 % (ref 43.0–77.0)
Platelets: 182 10*3/uL (ref 150.0–400.0)
RBC: 3.85 Mil/uL — ABNORMAL LOW (ref 4.22–5.81)
RDW: 16.7 % — AB (ref 11.5–15.5)
WBC: 8.9 10*3/uL (ref 4.0–10.5)

## 2018-08-15 LAB — AMYLASE: Amylase: 32 U/L (ref 27–131)

## 2018-08-15 LAB — POCT HEMOGLOBIN-HEMACUE: HEMOGLOBIN: 11.6 g/dL — AB (ref 13.0–17.0)

## 2018-08-15 LAB — LIPASE: Lipase: 41 U/L (ref 11.0–59.0)

## 2018-08-15 MED ORDER — EPOETIN ALFA 10000 UNIT/ML IJ SOLN
10000.0000 [IU] | INTRAMUSCULAR | Status: DC
  Administered 2018-08-15: 10000 [IU] via SUBCUTANEOUS

## 2018-08-15 MED ORDER — EPOETIN ALFA 10000 UNIT/ML IJ SOLN
INTRAMUSCULAR | Status: AC
  Administered 2018-08-15: 10000 [IU] via SUBCUTANEOUS
  Filled 2018-08-15: qty 1

## 2018-08-15 NOTE — Telephone Encounter (Signed)
FYI

## 2018-08-15 NOTE — Progress Notes (Signed)
Name: Gerald Jacobs   MRN: [id number]    DOB: Nov 27, 1925   Date:08/15/2018       Progress Note  Subjective  Chief Complaint Diarrhea  HPI  Gerald Jacobs is here today for evaluation of decreased appetite and diarrhea, which began last week while on course of augmentin for an ear infection. He has completed the augmentin and diarrhea has resolved but continues to have decreased appetite and "feeling queasy.' Due to his history of hepatic cholangiocarcinoma and renal insufficiency, and [place], his daughter called requesting him to be seen today and labs updated to make sure he is stable.  He denies fevers, syncope, confusion, vomiting, abdominal pain, urinary changes, constipation. He has nausea medication at home, which he took yesterday with some relief of his symptoms.  Patient Active Problem List   Diagnosis Date Noted  . Mass of bile duct 06/27/2017  . Liver mass   . Hyperbilirubinemia 06/15/2017  . Essential hypertension 09/02/2016  . Statin myopathy 07/28/2016  . Normocytic anemia 06/23/2016  . Medicare annual wellness visit, subsequent 01/18/2016  . Routine general medical examination at a health care facility 01/18/2016  . Frequent PVCs 11/24/2014  . Intermittent palpitations 07/23/2014  . Chronic cholecystitis with calculus 02/04/2014  . Choledocholithiasis 08/28/2013  . Symptomatic cholelithiasis 06/04/2013  . Elevated LFTs 06/04/2013  . Benign hypertensive heart disease without heart failure 09/07/2011  . Type 2 diabetes mellitus with stage 4 chronic kidney disease, without long-term current use of insulin ([place]) 09/07/2011  . Dyslipidemia 09/07/2011  . CKD (chronic kidney disease), stage IV ([place]) 09/07/2011    Social History   Tobacco Use  . Smoking status: Former Smoker    Packs/day: 1.00    Years: 45.00    Pack years: 45.00    Types: Cigarettes    Last attempt to quit: 09/07/1979    Years since quitting: 38.9   . Smokeless tobacco: Never Used  Substance Use Topics  . Alcohol use: Yes    Comment: beer rarely     Current Outpatient Medications:  .  allopurinol (ZYLOPRIM) 100 MG tablet, Take 1 tablet (100 mg total) by mouth daily., Disp: 90 tablet, Rfl: 0 .  amLODipine (NORVASC) 5 MG tablet, TAKE 1 TABLET BY MOUTH EVERY DAY, Disp: 30 tablet, Rfl: 3 .  aspirin 81 MG tablet, Take 81 mg by mouth daily.  , Disp: , Rfl:  .  Biotin 1000 MCG tablet, Take 1,000 mcg by mouth daily., Disp: , Rfl:  .  cholecalciferol (VITAMIN D) 1000 units tablet, Take 4,000 Units by mouth daily. , Disp: , Rfl:  .  feeding supplement, GLUCERNA SHAKE, (GLUCERNA SHAKE) LIQD, Take 237 mLs by mouth 2 (two) times daily between meals., Disp: 237 mL, Rfl: 0 .  glucose blood (ONE TOUCH ULTRA TEST) test strip, USE ONE STRIP PER TEST. CHECK BLOOD SUGARS 1-4 TIMES PER DAY AS INSTRUCTED., Disp: 100 each, Rfl: 4 .  losartan (COZAAR) 50 MG tablet, Take 50 mg by mouth daily., Disp: , Rfl: 1 .  metoprolol succinate (TOPROL-XL) 50 MG 24 hr tablet, Take 1 tablet (50 mg total) by mouth every morning., Disp: 90 tablet, Rfl: 1 .  neomycin-polymyxin-hydrocortisone (CORTISPORIN) OTIC solution, Place 3 drops into the left ear 3 (three) times daily., Disp: 10 mL, Rfl: 0 .  ondansetron (ZOFRAN) 8 MG tablet, Take by mouth every 8 (eight) hours as needed for nausea or vomiting., Disp: , Rfl:  .  [facility] DELICA  LANCETS 33G MISC, TEST BLOOD SUGARS 1-4 TIMES A DAY AS INSTRUCTED. DX CODE: E11.9, Disp: 100 each, Rfl: 2 .  pantoprazole (PROTONIX) 40 MG tablet, Take 40 mg by mouth daily. , Disp: , Rfl:  .  promethazine (PHENERGAN) 12.5 MG tablet, Take 12.5 mg by mouth every 6 (six) hours as needed for nausea or vomiting., Disp: , Rfl:  .  traZODone (DESYREL) 50 MG tablet, Take 0.5-1 tablets (25-50 mg total) by mouth at bedtime as needed for sleep., Disp: 30 tablet, Rfl: 3 .  triamcinolone (KENALOG) 0.025 % cream, Apply topically., Disp: , Rfl:  .  triamcinolone  (KENALOG) 0.1 % paste, Apply topically., Disp: , Rfl:  No current facility-administered medications for this visit.   Facility-Administered Medications Ordered in Other Visits:  .  epoetin alfa (EPOGEN,PROCRIT) injection 10,000 Units, 10,000 Units, Subcutaneous, Weekly, Elmarie Shiley, MD, 10,000 Units at 08/15/18 0831  Allergies  Allergen Reactions  . Augmentin [Amoxicillin-Pot Clavulanate]     Diarrhea   . Colchicine Other (See Comments)    GI problems  . Flexeril [Cyclobenzaprine Hcl] Other (See Comments)    GI problems   . Levaquin [Levofloxacin In D5w] Other (See Comments)    Insomnia   . Percocet [Oxycodone-Acetaminophen] Nausea And Vomiting  . Zebeta Other (See Comments)    Gi problems  . Bisoprolol Fumarate Other (See Comments)    Gi problems    ROS  No other specific complaints in a complete review of systems (except as listed in HPI above).  Objective  Vitals:   08/15/18 1021  BP: (!) 150/60  Pulse: 66  SpO2: 98%  Weight: 145 lb (65.8 kg)  Height: 5\' 7"  (1.702 m)   Body mass index is 22.71 kg/m.  Nursing Note and Vital Signs reviewed.   Physical Exam  Constitutional: Patient appears well-developed and well-nourished.  No distress.  HEENT: head atraumatic, normocephalic, pupils equal and reactive to light, EOM's intact, TM's without erythema or bulging, neck supple without lymphadenopathy, oropharynx pink and moist without exudate Cardiovascular: Normal rate, regular rhythm, normal heart sounds. Distal pulses intact. Pulmonary/Chest: Effort normal and breath sounds clear. No respiratory distress or retractions. Abdominal: Soft and non-tender, no distension. Neurological: he is alert and oriented to person, place, and time. Coordination, balance, strength, speech and gait are normal.  Skin: Skin is warm and dry. No rash noted. No erythema.  Psychiatric: Patient has a normal mood and affect. behavior is normal. Judgment and thought content  normal.  Assessment & Plan  Diarrhea, unspecified type resolved Likely a side effect of Augmentin, which we discussed Labs as requested today F/U with further recommendations pending lab results Home management, return precautions and additional information discussed and provided on AVS - CBC with Differential/Platelet; Future - Comprehensive metabolic panel; Future - Hepatic function panel; Future - Amylase; Future - Lipase; Future - Urinalysis; Future  Mass of bile duct Labs as requested today F/U with further recommendations pending lab results - CBC with Differential/Platelet; Future - Comprehensive metabolic panel; Future - Hepatic function panel; Future - Amylase; Future - Lipase; Future  CKD (chronic kidney disease), stage IV ([place]) Labs as requested today F/U with further recommendations pending lab results - Comprehensive metabolic panel; Future - Urinalysis; Future

## 2018-08-15 NOTE — Patient Instructions (Signed)
Please head downstairs for lab work If any of your test results are critically abnormal, you will be contacted right away.  Otherwise, I will contact you ASAP with test results and follow up recommendations.   Diarrhea, Adult Diarrhea is when you have loose and water poop (stool) often. Diarrhea can make you feel weak and cause you to get dehydrated. Dehydration can make you tired and thirsty, make you have a dry mouth, and make it so you pee (urinate) less often. Diarrhea often lasts 2-3 days. However, it can last longer if it is a sign of something more serious. It is important to treat your diarrhea as told by your doctor. Follow these instructions at home: Eating and drinking  Follow these recommendations as told by your doctor:  Take an oral rehydration solution (ORS). This is a drink that is sold at pharmacies and stores.  Drink clear fluids, such as: ? Water. ? Ice chips. ? Diluted fruit juice. ? Low-calorie sports drinks.  Eat bland, easy-to-digest foods in small amounts as you are able. These foods include: ? Bananas. ? Applesauce. ? Rice. ? Low-fat (lean) meats. ? Toast. ? Crackers.  Avoid drinking fluids that have a lot of sugar or caffeine in them.  Avoid alcohol.  Avoid spicy or fatty foods.  General instructions   Drink enough fluid to keep your pee (urine) clear or pale yellow.  Wash your hands often. If you cannot use soap and water, use hand sanitizer.  Make sure that all people in your home wash their hands well and often.  Take over-the-counter and prescription medicines only as told by your doctor.  Rest at home while you get better.  Watch your condition for any changes.  Take a warm bath to help with any burning or pain from having diarrhea.  Keep all follow-up visits as told by your doctor. This is important. Contact a doctor if:  You have a fever.  Your diarrhea gets worse.  You have new symptoms.  You cannot keep fluids  down.  You feel light-headed or dizzy.  You have a headache.  You have muscle cramps. Get help right away if:  You have chest pain.  You feel very weak or you pass out (faint).  You have bloody or black poop or poop that look like tar.  You have very bad pain, cramping, or bloating in your belly (abdomen).  You have trouble breathing or you are breathing very quickly.  Your heart is beating very quickly.  Your skin feels cold and clammy.  You feel confused.  You have signs of dehydration, such as: ? Dark pee, hardly any pee, or no pee. ? Cracked lips. ? Dry mouth. ? Sunken eyes. ? Sleepiness. ? Weakness. This information is not intended to replace advice given to you by your health care provider. Make sure you discuss any questions you have with your health care provider. Document Released: 05/02/2008 Document Revised: 06/03/2016 Document Reviewed: 07/21/2015 Elsevier Interactive Patient Education  2018 [employer].

## 2018-08-21 ENCOUNTER — Encounter: Payer: Self-pay | Admitting: Nurse Practitioner

## 2018-08-22 ENCOUNTER — Ambulatory Visit: Payer: Medicare Other | Admitting: Nurse Practitioner

## 2018-08-22 ENCOUNTER — Encounter (HOSPITAL_COMMUNITY): Payer: Self-pay | Admitting: Emergency Medicine

## 2018-08-22 ENCOUNTER — Emergency Department (HOSPITAL_COMMUNITY)
Admission: EM | Admit: 2018-08-22 | Discharge: 2018-08-22 | Disposition: A | Discharge status: Home / Self Care | Payer: Medicare Other | Attending: Emergency Medicine | Admitting: Emergency Medicine

## 2018-08-22 ENCOUNTER — Other Ambulatory Visit: Payer: Medicare Other

## 2018-08-22 ENCOUNTER — Encounter (HOSPITAL_COMMUNITY): Payer: Medicare Other

## 2018-08-22 ENCOUNTER — Encounter: Payer: Self-pay | Admitting: Nurse Practitioner

## 2018-08-22 ENCOUNTER — Other Ambulatory Visit (INDEPENDENT_AMBULATORY_CARE_PROVIDER_SITE_OTHER): Payer: Medicare Other

## 2018-08-22 VITALS — BP 150/64 | HR 91 | Temp 98.6°F | Ht 67.0 in | Wt 149.0 lb

## 2018-08-22 DIAGNOSIS — R509 Fever, unspecified: Secondary | ICD-10-CM

## 2018-08-22 DIAGNOSIS — Z7982 Long term (current) use of aspirin: Secondary | ICD-10-CM | POA: Diagnosis not present

## 2018-08-22 DIAGNOSIS — R197 Diarrhea, unspecified: Secondary | ICD-10-CM | POA: Insufficient documentation

## 2018-08-22 DIAGNOSIS — I129 Hypertensive chronic kidney disease with stage 1 through stage 4 chronic kidney disease, or unspecified chronic kidney disease: Secondary | ICD-10-CM | POA: Diagnosis not present

## 2018-08-22 DIAGNOSIS — N184 Chronic kidney disease, stage 4 (severe): Secondary | ICD-10-CM | POA: Insufficient documentation

## 2018-08-22 DIAGNOSIS — Z87891 Personal history of nicotine dependence: Secondary | ICD-10-CM | POA: Insufficient documentation

## 2018-08-22 DIAGNOSIS — Z79899 Other long term (current) drug therapy: Secondary | ICD-10-CM | POA: Insufficient documentation

## 2018-08-22 DIAGNOSIS — E119 Type 2 diabetes mellitus without complications: Secondary | ICD-10-CM | POA: Diagnosis not present

## 2018-08-22 LAB — COMPREHENSIVE METABOLIC PANEL
ALT: 13 U/L (ref 0–53)
AST: 11 U/L (ref 0–37)
Albumin: 3.9 g/dL (ref 3.5–5.2)
Alkaline Phosphatase: 82 U/L (ref 39–117)
BILIRUBIN TOTAL: 0.5 mg/dL (ref 0.2–1.2)
BUN: 43 mg/dL — AB (ref 6–23)
CHLORIDE: 101 meq/L (ref 96–112)
CO2: 22 meq/L (ref 19–32)
CREATININE: 2.61 mg/dL — AB (ref 0.40–1.50)
Calcium: 9.1 mg/dL (ref 8.4–10.5)
GFR: 24.61 mL/min — ABNORMAL LOW (ref >60.00)
GLUCOSE: 196 mg/dL — AB (ref 70–99)
Potassium: 3.9 mEq/L (ref 3.5–5.1)
SODIUM: 133 meq/L — AB (ref 135–145)
Total Protein: 7.8 g/dL (ref 6.0–8.3)

## 2018-08-22 LAB — URINALYSIS, ROUTINE W REFLEX MICROSCOPIC
BACTERIA UA: NONE SEEN
BILIRUBIN URINE: NEGATIVE
Bilirubin Urine: NEGATIVE
GLUCOSE, UA: NEGATIVE mg/dL
Hgb urine dipstick: NEGATIVE
KETONES UR: NEGATIVE mg/dL
Ketones, ur: NEGATIVE
LEUKOCYTES UA: NEGATIVE
Leukocytes, UA: NEGATIVE
NITRITE: NEGATIVE
Nitrite: NEGATIVE
PH: 5.5 (ref 5.0–8.0)
PROTEIN: 100 mg/dL — AB
SPECIFIC GRAVITY, URINE: 1.02 (ref 1.000–1.030)
Specific Gravity, Urine: 1.006 (ref 1.005–1.030)
Total Protein, Urine: 100 — AB
Urine Glucose: NEGATIVE
Urobilinogen, UA: 0.2 (ref 0.0–1.0)
pH: 5 (ref 5.0–8.0)

## 2018-08-22 LAB — CBC WITH DIFFERENTIAL/PLATELET
BASOS ABS: 0.1 10*3/uL (ref 0.0–0.1)
BASOS PCT: 0.5 % (ref 0.0–3.0)
EOS ABS: 0.2 10*3/uL (ref 0.0–0.7)
Eosinophils Relative: 1.2 % (ref 0.0–5.0)
HEMATOCRIT: 34.9 % — AB (ref 39.0–52.0)
Hemoglobin: 11.7 g/dL — ABNORMAL LOW (ref 13.0–17.0)
LYMPHS ABS: 1.9 10*3/uL (ref 0.7–4.0)
Lymphocytes Relative: 11.1 % — ABNORMAL LOW (ref 12.0–46.0)
MCHC: 33.4 g/dL (ref 30.0–36.0)
MCV: 93.7 fl (ref 78.0–100.0)
MONO ABS: 1.6 10*3/uL — AB (ref 0.1–1.0)
Monocytes Relative: 9.4 % (ref 3.0–12.0)
NEUTROS ABS: 13.2 10*3/uL — AB (ref 1.4–7.7)
NEUTROS PCT: 77.8 % — AB (ref 43.0–77.0)
PLATELETS: 235 10*3/uL (ref 150.0–400.0)
RBC: 3.72 Mil/uL — ABNORMAL LOW (ref 4.22–5.81)
RDW: 15.7 % — AB (ref 11.5–15.5)
WBC: 17 10*3/uL — ABNORMAL HIGH (ref 4.0–10.5)

## 2018-08-22 LAB — I-STAT CG4 LACTIC ACID, ED: Lactic Acid, Venous: 0.84 mmol/L (ref 0.5–1.9)

## 2018-08-22 MED ORDER — METRONIDAZOLE 500 MG PO TABS
500.0000 mg | ORAL_TABLET | Freq: Once | ORAL | Status: AC
  Administered 2018-08-22: 500 mg via ORAL
  Filled 2018-08-22: qty 1

## 2018-08-22 MED ORDER — METRONIDAZOLE 500 MG PO TABS: 500.0000 mg | ORAL_TABLET | Freq: Two times a day (BID) | ORAL | 0 refills | Status: DC

## 2018-08-22 MED ORDER — SODIUM CHLORIDE 0.9 % IV BOLUS
500.0000 mL | Freq: Once | INTRAVENOUS | Status: AC
  Administered 2018-08-22: 500 mL via INTRAVENOUS

## 2018-08-22 NOTE — ED Provider Notes (Signed)
[facility] EMERGENCY DEPARTMENT Provider Note   CSN: [id number] Arrival date & time: 08/22/18  1606     History   Chief Complaint Chief Complaint  Patient presents with  . Diarrhea  . Abnormal Lab    HPI Gerald Jacobs is a 84 y.o. male with history of hepatic cancer presenting from PCP for evaluation of diarrhea.  Patient states that his diarrhea began approximately 2 weeks ago after course of Augmentin for otitis media.  Patient states that for the past 2 weeks he has had watery, nonbloody diarrhea approximately 4-6 times daily.  Patient denies abdominal pain, nausea or vomiting.  Patient states that he measured a temperature of 101 yesterday morning however has not had a fever since then. At primary care provider's office there is concern for C. difficile, stool sample was collected and routine labs were drawn.  Patient CBC showed a white count of 17 and patient was encouraged to present to the emergency department for further evaluation. Upon arrival patient states that he is feeling well, has had 3 bouts of diarrhea today, denying abdominal pain.  HPI  Past Medical History:  Diagnosis Date  . Bilateral shoulder pain 07/28/2016  . Cancer [facility])    prostate  . Chest pain 10/27/2016  . Chronic anemia    With normal iron studies and normal serum protein electrophoresis  . Coronary artery disease    Mild  . Cough 10/27/2016  . Diabetes mellitus    Adult onset  . Essential hypertension   . GERD (gastroesophageal reflux disease)    occ tums  . History of gout   . Hyperbilirubinemia 06/15/2017  . Hyponatremia 06/15/2017  . Intermittent palpitations 07/23/2014  . Liver mass   . Mass of bile duct 06/27/2017  . Mild renal insufficiency   . Nocturia    x2  . Normocytic anemia 06/23/2016  . Osteoarthritis   . Sciatica of right side 07/28/2016  . Statin myopathy 07/28/2016  . Weight loss 06/15/2017    Patient Active Problem List   Diagnosis Date Noted  .  Intrahepatic cholangiocarcinoma ([place]) 06/27/2017  . Liver mass   . Hyperbilirubinemia 06/15/2017  . Essential hypertension 09/02/2016  . Statin myopathy 07/28/2016  . Normocytic anemia 06/23/2016  . Medicare annual wellness visit, subsequent 01/18/2016  . Routine general medical examination at a health care facility 01/18/2016  . Frequent PVCs 11/24/2014  . Intermittent palpitations 07/23/2014  . Chronic cholecystitis with calculus 02/04/2014  . Choledocholithiasis 08/28/2013  . Symptomatic cholelithiasis 06/04/2013  . Elevated LFTs 06/04/2013  . Benign hypertensive heart disease without heart failure 09/07/2011  . Type 2 diabetes mellitus with stage 4 chronic kidney disease, without long-term current use of insulin ([place]) 09/07/2011  . Dyslipidemia 09/07/2011  . CKD (chronic kidney disease), stage IV ([place]) 09/07/2011    Past Surgical History:  Procedure Laterality Date  . CHOLECYSTECTOMY  02/04/2014   DR Zella Richer  . CHOLECYSTECTOMY N/A 02/04/2014   Procedure: LAPAROSCOPIC CHOLECYSTECTOMY with intraoperative cholangiogram;  Surgeon: Odis Hollingshead, MD;  Location: [facility];  Service: General;  Laterality: N/A;  . ERCP N/A 10/15/2013   Procedure: ENDOSCOPIC RETROGRADE CHOLANGIOPANCREATOGRAPHY (ERCP);  Surgeon: Jeryl Columbia, MD;  Location: [facility];  Service: Endoscopy;  Laterality: N/A;  . KNEE SURGERY Right 1979  . melanoma surgery  1970's   on scalp  . PROSTATE SURGERY  1993   Prostate Cancer  . SPHINCTEROTOMY  10/15/2013   Procedure: SPHINCTEROTOMY;  Surgeon: Jeryl Columbia, MD;  Location:  [facility];  Service: Endoscopy;;      Home Medications    Prior to Admission medications   Medication Sig Start Date End Date Taking? Authorizing Provider  allopurinol (ZYLOPRIM) 100 MG tablet Take 1 tablet (100 mg total) by mouth daily. 08/01/18   Lance Sell, NP  amLODipine (NORVASC) 5 MG tablet TAKE 1 TABLET BY MOUTH EVERY DAY 07/24/18   Lance Sell, NP  aspirin  81 MG tablet Take 81 mg by mouth daily.      [provider]  Biotin 1000 MCG tablet Take 1,000 mcg by mouth daily.    [provider]  cholecalciferol (VITAMIN D) 1000 units tablet Take 4,000 Units by mouth daily.     [provider]  feeding supplement, GLUCERNA SHAKE, (GLUCERNA SHAKE) LIQD Take 237 mLs by mouth 2 (two) times daily between meals. 06/20/17   Robbie Lis, MD  glucose blood (ONE TOUCH ULTRA TEST) test strip USE ONE STRIP PER TEST. CHECK BLOOD SUGARS 1-4 TIMES PER DAY AS INSTRUCTED. 08/01/18   Lance Sell, NP  losartan (COZAAR) 50 MG tablet Take 50 mg by mouth daily. 06/28/18   [provider]  metoprolol succinate (TOPROL-XL) 50 MG 24 hr tablet Take 1 tablet (50 mg total) by mouth every morning. 08/16/17   Burtis Junes, NP  metroNIDAZOLE (FLAGYL) 500 MG tablet Take 1 tablet (500 mg total) by mouth 2 (two) times daily. 08/22/18   Deliah Boston, PA-C  neomycin-polymyxin-hydrocortisone (CORTISPORIN) OTIC solution Place 3 drops into the left ear 3 (three) times daily. 08/06/18   Marrian Salvage, FNP  ondansetron (ZOFRAN) 8 MG tablet Take by mouth every 8 (eight) hours as needed for nausea or vomiting.    [provider]  [facility] DELICA LANCETS 71G MISC TEST BLOOD SUGARS 1-4 TIMES A DAY AS INSTRUCTED. DX CODE: E11.9 08/01/18   Lance Sell, NP  pantoprazole (PROTONIX) 40 MG tablet Take 40 mg by mouth daily.  01/26/18 01/26/19  [provider]  promethazine (PHENERGAN) 12.5 MG tablet Take 12.5 mg by mouth every 6 (six) hours as needed for nausea or vomiting.    [provider]  traZODone (DESYREL) 50 MG tablet Take 0.5-1 tablets (25-50 mg total) by mouth at bedtime as needed for sleep. 07/24/17   Golden Circle, FNP  triamcinolone (KENALOG) 0.025 % cream Apply topically.    [provider]  triamcinolone (KENALOG) 0.1 % paste Apply topically.    [provider]    Family  History Family History  Problem Relation Age of Onset  . Stroke Father   . Stroke Mother   . Stroke Sister   . Stroke Brother   . Stroke Brother   . Stroke Brother     Social History Social History   Tobacco Use  . Smoking status: Former Smoker    Packs/day: 1.00    Years: 45.00    Pack years: 45.00    Types: Cigarettes    Last attempt to quit: 09/07/1979    Years since quitting: 38.9  . Smokeless tobacco: Never Used  Substance Use Topics  . Alcohol use: Yes    Comment: beer rarely  . Drug use: No     Allergies   Augmentin [amoxicillin-pot clavulanate]; Colchicine; Flexeril [cyclobenzaprine hcl]; Levaquin [levofloxacin in d5w]; Percocet [oxycodone-acetaminophen]; Zebeta; and Bisoprolol fumarate   Review of Systems Review of Systems  Constitutional: Negative.  Negative for chills and fever.  HENT: Negative.  Negative for rhinorrhea and  sore throat.   Eyes: Negative.  Negative for visual disturbance.  Respiratory: Negative.  Negative for cough and shortness of breath.   Cardiovascular: Negative.  Negative for chest pain.  Gastrointestinal: Positive for diarrhea. Negative for abdominal pain, blood in stool, nausea and vomiting.  Genitourinary: Negative.  Negative for dysuria and hematuria.  Musculoskeletal: Negative.  Negative for arthralgias and myalgias.  Skin: Negative.  Negative for rash.  Neurological: Negative.  Negative for dizziness, weakness and headaches.   Physical Exam Updated Vital Signs BP (!) 152/55   Pulse 86   Temp 98.9 F (37.2 C) (Oral)   Resp 16   SpO2 100%   Physical Exam  Constitutional: He is oriented to person, place, and time. He appears well-developed and well-nourished. No distress.  HENT:  Head: Normocephalic and atraumatic.  Right Ear: External ear normal.  Left Ear: External ear normal.  Nose: Nose normal.  Eyes: Pupils are equal, round, and reactive to light. EOM are normal.  Neck: Trachea normal, normal range of motion and  full passive range of motion without pain. Neck supple. No tracheal deviation present.  Cardiovascular: Normal rate, regular rhythm, normal heart sounds and intact distal pulses.  Pulses:      Dorsalis pedis pulses are 2+ on the right side, and 2+ on the left side.       Posterior tibial pulses are 2+ on the right side, and 2+ on the left side.  Pulmonary/Chest: Effort normal and breath sounds normal. No respiratory distress.  Abdominal: Soft. Normal appearance. There is no tenderness. There is no rigidity, no rebound, no guarding, no CVA tenderness, no tenderness at McBurney's point and negative Murphy's sign.  Musculoskeletal: Normal range of motion. He exhibits no edema or tenderness.       Right lower leg: Normal.       Left lower leg: Normal.  Feet:  Right Foot:  Protective Sensation: 3 sites tested. 3 sites sensed.  Left Foot:  Protective Sensation: 3 sites tested. 3 sites sensed.  Neurological: He is alert and oriented to person, place, and time. He has normal strength. GCS eye subscore is 4. GCS verbal subscore is 5. GCS motor subscore is 6.  Speech is clear and goal oriented, follows commands Major Cranial nerves without deficit, no facial droop Moves extremities without ataxia, coordination intact Normal gait  Skin: Skin is warm and dry. Capillary refill takes less than 2 seconds.  Psychiatric: He has a normal mood and affect. His behavior is normal.    ED Treatments / Results  Labs (all labs ordered are listed, but only abnormal results are displayed) Labs Reviewed  URINALYSIS, ROUTINE W REFLEX MICROSCOPIC - Abnormal; Notable for the following components:      Result Value   Color, Urine STRAW (*)    Protein, ur 100 (*)    All other components within normal limits  GASTROINTESTINAL PANEL BY PCR, STOOL (REPLACES STOOL CULTURE)  C DIFFICILE QUICK SCREEN W PCR REFLEX  I-STAT CG4 LACTIC ACID, ED  I-STAT CG4 LACTIC ACID, ED    EKG None  Radiology No results  found.  Procedures Procedures (including critical care time)  Medications Ordered in ED Medications  sodium chloride 0.9 % bolus 500 mL (0 mLs Intravenous Stopped 08/22/18 2231)  metroNIDAZOLE (FLAGYL) tablet 500 mg (500 mg Oral Given 08/22/18 2136)  sodium chloride 0.9 % bolus 500 mL (0 mLs Intravenous Stopped 08/22/18 2231)     Initial Impression / Assessment and Plan / ED Course  I  have reviewed the triage vital signs and the nursing notes.  Pertinent labs & imaging results that were available during my care of the patient were reviewed by me and considered in my medical decision making (see chart for details).  Clinical Course as of Aug 22 2249  Wed Aug 22, 2018  2027 Patient not in room   [BM]  2233 Patient seen and evaluated by Dr. Eulis Foster.  Dr. Eulis Foster advises Flagyl 500 mg twice daily for 7 days.  Low fiber diet.  Follow-up with PCP tomorrow.   [BM]    Clinical Course User Index [BM] Deliah Boston, PA-C   Patient with history of cholangiocarcinoma presenting from PCP for concern of elevated white blood cell count in setting of diarrhea, concern for C. difficile.  Patient with 2 weeks of nonbloody diarrhea.  Labs below obtained from PCP this morning CBC with WBC of 17 CMP with elevated creatinine, known CKD appears baseline from June labs Urinalysis nonacute C. difficile lab pending Gastrointestinal panel pending Orthostatic vital signs negative  Lactic acid performed in emergency department 0.84.  Fluid rehydration performed in emergency department due to reported decreased oral intake.  No signs of dehydration on examination today.    Patient is afebrile, not tachycardic, not tachypnea, well-appearing and in no acute distress.  Nonsurgical abdomen.  Patient with 3 episodes of diarrhea today, last here in emergency department small amount of soft stool.  Patient does not meet SIRS or sepsis criteria.  Patient has been started on Flagyl 500 mg p.o. twice  daily. Patient has been instructed to begin a low fiber diet to help with diarrhea. Patient has been instructed to follow-up with his PCP tomorrow morning.  Patient has been seen and evaluated by Dr. Eulis Foster who advises plan above and discharge at this time and outpatient follow-up.  At this time there does not appear to be any evidence of an acute emergency medical condition and the patient appears stable for discharge with appropriate outpatient follow up. Diagnosis was discussed with patient who verbalizes understanding of care plan and is agreeable to discharge. I have discussed return precautions with patient and family who verbalize understanding of return precautions. Patient strongly encouraged to follow-up with their PCP. All questions answered.  Patient's case discussed with Dr. Gardner Candle agrees with plan to discharge with follow-up.     Note: Portions of this report may have been transcribed using voice recognition software. Every effort was made to ensure accuracy; however, inadvertent computerized transcription errors may still be present.   Final Clinical Impressions(s) / ED Diagnoses   Final diagnoses:  Diarrhea, unspecified type    ED Discharge Orders         Ordered    metroNIDAZOLE (FLAGYL) 500 MG tablet  2 times daily     08/22/18 2231           Gari Crown 08/22/18 2251    Daleen Bo, MD 08/24/18 1521

## 2018-08-22 NOTE — ED Provider Notes (Signed)
  Face-to-face evaluation   History: He is here for evaluation of leukocytosis, associated with fever and diarrhea.  Diarrhea present for 2 days.  Diarrhea is better today.  He was treated with Augmentin about 2 weeks ago stopped it about 10 days ago, after he developed diarrhea.  Initially diarrhea lasted just a few days now has returned.  PCP saw patient today got labs including stool sample, instructed him to come to the ED because his white count was high.  I also suggested he might need evaluation by oncology, in [place], [place] for leukocytosis.  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  Last intervention required in July 2018, ERCP.  Patient has had decreased appetite for about 1 week with decreased oral intake of fluids and solids.  There is been no vomiting, no constipation, and no blood in stool.  Physical exam: Alert, elderly, sallow appearing male.  Regular rate and rhythm without murmur lungs clear to auscultation.  Abdomen normal bowel sounds soft and nontender.    Patient Vitals for the past 24 hrs:  BP Temp Temp src Pulse Resp SpO2  08/22/18 2130 (!) 154/57 - - 84 - 100 %  08/22/18 2100 (!) 158/60 - - 82 - 100 %  08/22/18 2045 (!) 149/59 - - 82 - 100 %  08/22/18 1900 (!) 146/61 98.9 F (37.2 C) Oral 79 16 98 %  08/22/18 1750 101/65 98.6 F (37 C) Oral 80 18 99 %  08/22/18 1619 (!) 135/58 98.8 F (37.1 C) Oral 83 18 98 %    10:22 PM Reevaluation with update and discussion. After initial assessment and treatment, an updated evaluation reveals patient has been able to drink about 6 ounces of water.  States he feels comfortable.  He had a very minimal amount of diarrhea when he made it about 30 minutes ago.  Normal pain or cramping at this time.  He feels well enough to go home.  At this time the patient's son is here, his sister went home.  I had earlier talked to his sister with the patient in the room.  I discussed with the son, the implications of an  elevated white count in the face of diarrhea, concerning for C. difficile.  I do not think that the patient has a significant colitis at this time.  Therefore CT imaging or further intervention ED is not necessary.  Plan will be to continue Flagyl until C. difficile results returned, which were obtained earlier today.  At that point the PCP, can choose to continue with Flagyl or consider other treatment.  There is no indication that this represents a exacerbation of his cholangiocarcinoma.  Therefore contacting oncology, or admitting to the hospital, is not necessary.  All questions answered. Daleen Bo   Medical Decision Making: Nonspecific diarrhea post treatment.  Possible mild C. difficile enteritis.  Doubt significant colitis, serious bacterial infection or impending vascular collapse.  He is stable for discharge with outpatient management.  CRITICAL CARE-no Performed by: Daleen Bo   Medical screening examination/treatment/procedure(s) were conducted as a shared visit with non-physician practitioner(s) and myself.  I personally evaluated the patient during the encounter    Daleen Bo, MD 08/24/18 1521

## 2018-08-22 NOTE — Discharge Instructions (Signed)
Please return to the Emergency Department for any new or worsening symptoms or if your symptoms do not improve. Please be sure to follow up with your Primary Care Physician as soon as possible regarding your visit today. If you do not have a Primary Doctor please use the resources below to establish one. Please take the antibiotic Flagyl as prescribed, 500 mg twice a day for 7 days.  Please discuss this antibiotic choice with your primary care provider tomorrow morning. Please follow a low fiber diet as discussed in your discharge instructions.  Contact a doctor if: You have a fever. Your diarrhea gets worse. You have new symptoms. You cannot keep fluids down. You feel light-headed or dizzy. You have a headache. You have muscle cramps. Get help right away if: You have chest pain. You feel very weak or you pass out (faint). You have bloody or black poop or poop that look like tar. You have very bad pain, cramping, or bloating in your belly (abdomen). You have trouble breathing or you are breathing very quickly. Your heart is beating very quickly. Your skin feels cold and clammy. You feel confused. You have signs of dehydration, such as: Dark pee, hardly any pee, or no pee. Cracked lips. Dry mouth. Sunken eyes. Sleepiness. Weakness.  Do not take your medicine if  develop an itchy rash, swelling in your mouth or lips, or difficulty breathing.   RESOURCE GUIDE  Chronic Pain Problems: Contact [facility]  [phone] Patients need to be referred by their primary care doctor.  Insufficient Money for Medicine: Contact [employer] Way:  call "211" or [place] [phone].  No Primary Care Doctor: Call [facility]  [phone] - can help you locate a primary care doctor that  accepts your insurance, provides certain services, etc. Physician Referral Service- [phone]  Agencies that provide inexpensive medical care: [facility]   [facility]  [phone] [facility]  [phone] [facility]  [phone] [facility]  [phone] [facility]  [phone]  [place] Providers: [facility]- [address]  [phone], Mon-Fri 9am-7pm, Sat 9am-1pm [place], [address] [place]  [place], [address] [place]  [place]- [address]  [place], [address], [phone]  Only accepts [place] patients after they have their name  applied to their card  Self Pay (no insurance) in [facility]: Sickle Cell Patients: Dr Kevan Ny, [facility]  [place], [facility]- [place]  [facility] [place]- [address], [facility]- see information above (Speak to [facility] if you do not have insurance)       -  [facility]- [place], [place] [place],  [address], [phone]       -  Dr Vista Lawman-  [address], [place], [facility]- [address]  [address] [phone]       -  [facility]- [address], [place], also [address], [phone]       -    [facility]- [address], [place], 1st & 3rd Saturday   every month, 10am-1pm  1) Find a Doctor and Pay Out of Pocket Although you won't have to find out who is covered by your insurance plan, it is a good idea to ask around and get recommendations. You will then need to call the office and see if the doctor you have chosen will accept you as a new patient and what types of options they offer for patients who  are self-pay. Some doctors offer discounts or will set up payment plans for their patients who do not have insurance, but you will need to ask so you aren't surprised when you get to your appointment.  2) Contact Your Local Health Department Not all health departments have doctors that can see patients for sick visits, but many do, so it is worth a call to see if yours does. If you don't know where your local health department is, you can check in your phone book. The CDC also has a tool to help you locate your state's health department, and many state websites also have listings of all of their local health departments.  3) Find a [facility] If your illness is not likely to be very severe or complicated, you may want to try a walk in clinic. These are popping up all over the country in pharmacies, drugstores, and shopping centers. They're usually staffed by nurse practitioners or physician assistants that have been trained to treat common illnesses and complaints. They're usually fairly quick and inexpensive. However, if you have serious medical issues or chronic medical problems, these are probably not your best option  [facility] [place], [facility], [address], [place], phone [phone] or [phone].  Monday - Friday, call for an appointment. [place], [facility], [address], [place], phone [phone] or [phone].  Monday - Friday, call for an appointment.  Abuse/Neglect: [place] [phone] [place] [phone] (After Hours)  Emergency Shelter:  [facility] [phone]  Maternity Homes: Room at the [place] [phone] [place] [phone]  MRSA Hotline #:   (917) 253-1266  [facility]. [address]         [place] Phone:  [phone]                                  Phone:  [phone]  Phone:  [place], [place]- [phone]       -     [facility] in [place], [address]                                  [place] [phone] or [phone] (After Hours)   [place]  Substance Abuse Resources: Alcohol and Drug Services  [place] [phone] [facility] [place] Chinita Pester [phone] Residential & Outpatient Substance Abuse Program  [phone]  Psychological Services: [place]  [phone] [place]  [place], [place]. [address] [place], [place]: [phone] or [phone], Pic[employer]uy  Dental Assistance  If unable to pay or uninsured, contact:  [facility] or [facility]. to become qualified for the adult dental clinic.  Patients with Medicaid: [facility] [phone], [place] [address] [phone]  If unable to pay, or uninsured, contact [facility] [phone]) or [place] [phone] in [place], [place] in [facility]) to become qualified for the adult dental clinic   Other [place]- [place], [place], [place], [address], [place], [place], 2nd and 4th Thursday of the month at 6:30am.  10 clients each day by appointment, can sometimes see walk-in patients if someone does not show for an  appointment. [facility]- [address] Hillard D[place], [place], [address], [place], [place], [place], [address], [place] Department- [phone] [place] [facility] [phone]

## 2018-08-22 NOTE — Progress Notes (Signed)
Name: Gerald Jacobs   MRN: [id number]    DOB: 10/08/1925   Date:08/22/2018       Progress Note  Subjective  Chief Complaint Diarrhea  HPI Mr Marczak is here today for evaluation of an acute complaint of diarrhea, I saw him last week on 08/15/18 for decreased appetite, diarrhea which began after a course of augmentin for ear infection, he told me on 9/18 symptoms seemed to be improving, not having anymore diarrhea but was having some continued nausea, labs were done on his 9/18 visit that were all stable. He returns today, here with daughter, telling me that his diarrhea has returned, multiple soft stools a day. He also reports  Fevers, chills, decreased appetite. He denies syncope, confusion, abdominal pain, nausea, vomiting, rectal bleeding. Due to his history of hepatic cholangiocarcinoma and renal insufficiency, daughter is very concerned, would like labs repeated today including c diff panel  Patient Active Problem List   Diagnosis Date Noted  . 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 Liver mass   . Hyperbilirubinemia 06/15/2017  . Essential hypertension 09/02/2016  . Statin myopathy 07/28/2016  . Normocytic anemia 06/23/2016  . Medicare annual wellness visit, subsequent 01/18/2016  . Routine general medical examination at a health care facility 01/18/2016  . Frequent PVCs 11/24/2014  . Intermittent palpitations 07/23/2014  . Chronic cholecystitis with calculus 02/04/2014  . Choledocholithiasis 08/28/2013  . Symptomatic cholelithiasis 06/04/2013  . Elevated LFTs 06/04/2013  . Benign hypertensive heart disease without heart failure 09/07/2011  . Type 2 diabetes mellitus with stage 4 chronic kidney disease, without long-term current use of insulin ([employer]) 09/07/2011  . Dyslipidemia 09/07/2011  . CKD (chronic kidney disease), stage IV ([place]) 09/07/2011    Social History   Tobacco Use  . Smoking status: Former Smoker    Packs/day: 1.00    Years: 45.00    Pack years:  45.00    Types: Cigarettes    Last attempt to quit: 09/07/1979    Years since quitting: 38.9  . Smokeless tobacco: Never Used  Substance Use Topics  . Alcohol use: Yes    Comment: beer rarely     Current Outpatient Medications:  .  allopurinol (ZYLOPRIM) 100 MG tablet, Take 1 tablet (100 mg total) by mouth daily., Disp: 90 tablet, Rfl: 0 .  amLODipine (NORVASC) 5 MG tablet, TAKE 1 TABLET BY MOUTH EVERY DAY, Disp: 30 tablet, Rfl: 3 .  aspirin 81 MG tablet, Take 81 mg by mouth daily.  , Disp: , Rfl:  .  Biotin 1000 MCG tablet, Take 1,000 mcg by mouth daily., Disp: , Rfl:  .  cholecalciferol (VITAMIN D) 1000 units tablet, Take 4,000 Units by mouth daily. , Disp: , Rfl:  .  feeding supplement, GLUCERNA SHAKE, (GLUCERNA SHAKE) LIQD, Take 237 mLs by mouth 2 (two) times daily between meals., Disp: 237 mL, Rfl: 0 .  glucose blood (ONE TOUCH ULTRA TEST) test strip, USE ONE STRIP PER TEST. CHECK BLOOD SUGARS 1-4 TIMES PER DAY AS INSTRUCTED., Disp: 100 each, Rfl: 4 .  losartan (COZAAR) 50 MG tablet, Take 50 mg by mouth daily., Disp: , Rfl: 1 .  metoprolol succinate (TOPROL-XL) 50 MG 24 hr tablet, Take 1 tablet (50 mg total) by mouth every morning., Disp: 90 tablet, Rfl: 1 .  neomycin-polymyxin-hydrocortisone (CORTISPORIN) OTIC solution, Place 3 drops into the left ear 3 (three) times daily., Disp: 10 mL, Rfl: 0 .  ondansetron (ZOFRAN) 8 MG tablet, Take by mouth every 8 (eight) hours as needed for  nausea or vomiting., Disp: , Rfl:  .  ONETOUCH DELICA LANCETS 38G MISC, TEST BLOOD SUGARS 1-4 TIMES A DAY AS INSTRUCTED. DX CODE: E11.9, Disp: 100 each, Rfl: 2 .  pantoprazole (PROTONIX) 40 MG tablet, Take 40 mg by mouth daily. , Disp: , Rfl:  .  promethazine (PHENERGAN) 12.5 MG tablet, Take 12.5 mg by mouth every 6 (six) hours as needed for nausea or vomiting., Disp: , Rfl:  .  traZODone (DESYREL) 50 MG tablet, Take 0.5-1 tablets (25-50 mg total) by mouth at bedtime as needed for sleep., Disp: 30 tablet,  Rfl: 3 .  triamcinolone (KENALOG) 0.025 % cream, Apply topically., Disp: , Rfl:  .  triamcinolone (KENALOG) 0.1 % paste, Apply topically., Disp: , Rfl:   Allergies  Allergen Reactions  . Augmentin [Amoxicillin-Pot Clavulanate]     Diarrhea   . Colchicine Other (See Comments)    GI problems  . Flexeril [Cyclobenzaprine Hcl] Other (See Comments)    GI problems   . Levaquin [Levofloxacin In D5w] Other (See Comments)    Insomnia   . Percocet [Oxycodone-Acetaminophen] Nausea And Vomiting  . Zebeta Other (See Comments)    Gi problems  . Bisoprolol Fumarate Other (See Comments)    Gi problems    ROS  No other specific complaints in a complete review of systems (except as listed in HPI above).  Objective  Vitals:   08/22/18 1051  BP: (!) 150/64  Pulse: 91  Temp: 98.6 F (37 C)  TempSrc: Oral  SpO2: 98%  Weight: 149 lb (67.6 kg)  Height: 5\' 7"  (1.702 m)    Body mass index is 23.34 kg/m.  Nursing Note and Vital Signs reviewed.  Physical Exam Constitutional: Patient appears well-developed and well-nourished.  No distress.  HEENT: head atraumatic, normocephalic, pupils equal and reactive to light, EOM's intact, neck supple without lymphadenopathy, oropharynx pink and moist without exudate Cardiovascular: Normal rate, regular rhythm, normal heart sounds. Distal pulses intact. Pulmonary/Chest: Effort normal and breath sounds clear. No respiratory distress or retractions. Abdominal: Soft. Bowel sounds are normal, no distension. There is no tenderness. no masses Neurological: He is alert and oriented to person, place, and time. No cranial nerve deficit. Coordination, balance, strength, speech  are normal. Ambulatory with cane.  Skin: Skin is warm and dry. No rash noted. No erythema.  Psychiatric: Patient has a normal mood and affect. behavior is normal. Judgment and thought content normal.    Assessment & Plan  Diarrhea, unspecified type,  Fever, unspecified fever  cause Labs ordered today Home management, Red flags and when to present for emergency care or RTC including fever >101.37F,  new/worsening/un-resolving symptoms, reviewed and provided in AVS. F/U with further recommendations pending lab results Advised him to schedule an appointment with oncology for F/U, due to daughters concern continued concern about his condition - CBC with Differential/Platelet; Future - Comprehensive metabolic panel; Future - Urinalysis, Routine w reflex microscopic; Future - C. difficile GDH and Toxin A/B; Future

## 2018-08-22 NOTE — Patient Instructions (Signed)
Please head downstairs for lab work I will let you know when I get your tests back  I would like for you to check in with your oncologist for follow up  I hope you feel better!   Diarrhea, Adult Diarrhea is when you have loose and water poop (stool) often. Diarrhea can make you feel weak and cause you to get dehydrated. Dehydration can make you tired and thirsty, make you have a dry mouth, and make it so you pee (urinate) less often. Diarrhea often lasts 2-3 days. However, it can last longer if it is a sign of something more serious. It is important to treat your diarrhea as told by your doctor. Follow these instructions at home: Eating and drinking  Follow these recommendations as told by your doctor:  Take an oral rehydration solution (ORS). This is a drink that is sold at pharmacies and stores.  Drink clear fluids, such as: ? Water. ? Ice chips. ? Diluted fruit juice. ? Low-calorie sports drinks.  Eat bland, easy-to-digest foods in small amounts as you are able. These foods include: ? Bananas. ? Applesauce. ? Rice. ? Low-fat (lean) meats. ? Toast. ? Crackers.  Avoid drinking fluids that have a lot of sugar or caffeine in them.  Avoid alcohol.  Avoid spicy or fatty foods.  General instructions   Drink enough fluid to keep your pee (urine) clear or pale yellow.  Wash your hands often. If you cannot use soap and water, use hand sanitizer.  Make sure that all people in your home wash their hands well and often.  Take over-the-counter and prescription medicines only as told by your doctor.  Rest at home while you get better.  Watch your condition for any changes.  Take a warm bath to help with any burning or pain from having diarrhea.  Keep all follow-up visits as told by your doctor. This is important. Contact a doctor if:  You have a fever.  Your diarrhea gets worse.  You have new symptoms.  You cannot keep fluids down.  You feel light-headed or  dizzy.  You have a headache.  You have muscle cramps. Get help right away if:  You have chest pain.  You feel very weak or you pass out (faint).  You have bloody or black poop or poop that look like tar.  You have very bad pain, cramping, or bloating in your belly (abdomen).  You have trouble breathing or you are breathing very quickly.  Your heart is beating very quickly.  Your skin feels cold and clammy.  You feel confused.  You have signs of dehydration, such as: ? Dark pee, hardly any pee, or no pee. ? Cracked lips. ? Dry mouth. ? Sunken eyes. ? Sleepiness. ? Weakness. This information is not intended to replace advice given to you by your health care provider. Make sure you discuss any questions you have with your health care provider. Document Released: 05/02/2008 Document Revised: 06/03/2016 Document Reviewed: 07/21/2015 Elsevier Interactive Patient Education  2018 [employer].

## 2018-08-22 NOTE — ED Provider Notes (Signed)
Patient placed in Quick Look pathway, seen and evaluated   Chief Complaint: diarrhea, fevers  HPI:   Gerald Jacobs is a 84 y.o. male history of hepatic cancer, presents to the ED from his primary care doctor's office for evaluation of 2 weeks of diarrhea.  Patient was recently on antibiotics for a ear infection.  He reports watery stools, nonbloody.  He denies associated abdominal pain.  Does report for the past 2 days he has had fevers up to 101 at home.  He is afebrile with normal vitals on arrival and is in no acute distress.  He saw his PCP today who sent C. difficile testing and abdominal labs, awaiting results but white blood cell count came back at 17 so PCP sent to ED for further evaluation.  ROS: + Fevers, diarrhea. -Nausea, vomiting, abdominal pain, melena or hematochezia  Physical Exam:   Gen: No distress  Neuro: Awake and Alert  Skin: Warm    Focused Exam: Lungs clear, heart RRR,  Abdomen nontender to palpation throughout without guarding or rebound tenderness   Initiation of care has begun. The patient has been counseled on the process, plan, and necessity for staying for the completion/evaluation, and the remainder of the medical screening examination  Blood work was obtained and held given the patient does have blood work done at his primary care doctor today, lactic acid was added on as well as stool studies.     Jacqlyn Larsen, PA-C 08/22/18 1639    Sherwood Gambler, MD 08/22/18 [phone]

## 2018-08-22 NOTE — ED Triage Notes (Signed)
Pt presents to ED for assessment for diarrhea x 2 weeks from PCP.  Diarrhea started after patient took antibiotics for ear infection.  Patient c/o watery stools.  Hx of hepatic cancer.  WBC 17 at PCP today.

## 2018-08-23 ENCOUNTER — Telehealth: Payer: Self-pay

## 2018-08-23 ENCOUNTER — Other Ambulatory Visit: Payer: Self-pay | Admitting: Family

## 2018-08-23 ENCOUNTER — Other Ambulatory Visit: Payer: Self-pay

## 2018-08-23 ENCOUNTER — Other Ambulatory Visit: Payer: Self-pay | Admitting: Nurse Practitioner

## 2018-08-23 ENCOUNTER — Encounter: Payer: Self-pay | Admitting: Nurse Practitioner

## 2018-08-23 DIAGNOSIS — D72829 Elevated white blood cell count, unspecified: Secondary | ICD-10-CM

## 2018-08-23 LAB — CLOSTRIDIUM DIFFICILE TOXIN B, QUALITATIVE, REAL-TIME PCR: Toxigenic C. Difficile by PCR: DETECTED — AB

## 2018-08-23 LAB — C. DIFFICILE GDH AND TOXIN A/B
GDH ANTIGEN: DETECTED
MICRO NUMBER:: 91152903
SPECIMEN QUALITY:: ADEQUATE
TOXIN A AND B: NOT DETECTED

## 2018-08-23 MED ORDER — VANCOMYCIN HCL 125 MG PO CAPS: 125.0000 mg | ORAL_CAPSULE | Freq: Four times a day (QID) | ORAL | 0 refills | Status: DC

## 2018-08-23 MED ORDER — METRONIDAZOLE 500 MG PO TABS: 500.0000 mg | ORAL_TABLET | Freq: Two times a day (BID) | ORAL | 0 refills | Status: DC

## 2018-08-23 NOTE — Progress Notes (Signed)
Reviewed results with daughter and patient; he will not take Flagyl as prescribed by ER; change to Vancomycin 125 mg qid x 10 days; per patient and daughter, feeling better today after IV fluids given; plan to re-check CBC tomorrow; will plan to call daughter, [name] at [phone] with results tomorrow.

## 2018-08-23 NOTE — Telephone Encounter (Signed)
Patient and patient's daughter/carmen is requesting transfer of care from [facility] to an MD, routing to dr Ronnald Ramp, per our conversation earlier, would you be ok with patient transferring care to you as pcp?  Please advise, I will call Carmen/daughter back at [phone], thanks

## 2018-08-23 NOTE — Telephone Encounter (Signed)
Yes, I will see him.

## 2018-08-24 ENCOUNTER — Other Ambulatory Visit (INDEPENDENT_AMBULATORY_CARE_PROVIDER_SITE_OTHER): Payer: Medicare Other

## 2018-08-24 ENCOUNTER — Telehealth: Payer: Self-pay | Admitting: Nurse Practitioner

## 2018-08-24 DIAGNOSIS — D72829 Elevated white blood cell count, unspecified: Secondary | ICD-10-CM | POA: Diagnosis not present

## 2018-08-24 LAB — CBC WITH DIFFERENTIAL/PLATELET
BASOS PCT: 0.4 % (ref 0.0–3.0)
Basophils Absolute: 0 10*3/uL (ref 0.0–0.1)
Eosinophils Absolute: 0.5 10*3/uL (ref 0.0–0.7)
Eosinophils Relative: 5.4 % — ABNORMAL HIGH (ref 0.0–5.0)
HCT: 33.7 % — ABNORMAL LOW (ref 39.0–52.0)
Hemoglobin: 11.3 g/dL — ABNORMAL LOW (ref 13.0–17.0)
LYMPHS PCT: 17.3 % (ref 12.0–46.0)
Lymphs Abs: 1.6 10*3/uL (ref 0.7–4.0)
MCHC: 33.4 g/dL (ref 30.0–36.0)
MCV: 93.6 fl (ref 78.0–100.0)
Monocytes Absolute: 1 10*3/uL (ref 0.1–1.0)
Monocytes Relative: 10.9 % (ref 3.0–12.0)
NEUTROS PCT: 66 % (ref 43.0–77.0)
Neutro Abs: 6 10*3/uL (ref 1.4–7.7)
PLATELETS: 219 10*3/uL (ref 150.0–400.0)
RBC: 3.6 Mil/uL — ABNORMAL LOW (ref 4.22–5.81)
RDW: 15.7 % — AB (ref 11.5–15.5)
WBC: 9.1 10*3/uL (ref 4.0–10.5)

## 2018-08-24 NOTE — Telephone Encounter (Signed)
I have talked with Gerald Jacobs/Gerald Jacobs, patient would like to continue care with laura, NP, I will be having conversation with Gerald Jacobs for advisement--Gerald Jacobs and patient already aware of lab results and patient is feeling better, diarrhea has stopped and he is tolerating vanc well

## 2018-08-24 NOTE — Telephone Encounter (Signed)
Patients daughter calling to speak with Jonelle Sidle.  States this will be a 2 or 3 minute conversation.  Did states it is not in regard to the transfer that needs to be scheduled with Dr. Ronnald Ramp.  Daughter states this is a different matter but did not want to disclose what this matter was.

## 2018-08-24 NOTE — Telephone Encounter (Signed)
Pts daughter would like to speak with Jonelle Sidle regarding this. Please call when able.

## 2018-08-24 NOTE — Telephone Encounter (Signed)
I have talked with daughter/carmen, see other phone note

## 2018-08-29 ENCOUNTER — Encounter (HOSPITAL_COMMUNITY): Payer: Medicare Other

## 2018-08-30 ENCOUNTER — Other Ambulatory Visit (HOSPITAL_COMMUNITY): Payer: Self-pay | Admitting: *Deleted

## 2018-08-31 ENCOUNTER — Encounter (HOSPITAL_COMMUNITY)
Admission: RE | Admit: 2018-08-31 | Discharge: 2018-08-31 | Disposition: A | Discharge status: Home / Self Care | Payer: Medicare Other | Source: Ambulatory Visit | Attending: Nephrology | Admitting: Nephrology

## 2018-08-31 VITALS — BP 158/63 | HR 77 | Temp 97.7°F | Resp 20

## 2018-08-31 DIAGNOSIS — D631 Anemia in chronic kidney disease: Secondary | ICD-10-CM | POA: Diagnosis present

## 2018-08-31 DIAGNOSIS — D649 Anemia, unspecified: Secondary | ICD-10-CM

## 2018-08-31 DIAGNOSIS — N184 Chronic kidney disease, stage 4 (severe): Secondary | ICD-10-CM | POA: Insufficient documentation

## 2018-08-31 LAB — IRON AND TIBC
Iron: 101 ug/dL (ref 45–182)
Saturation Ratios: 43 % — ABNORMAL HIGH (ref 17.9–39.5)
TIBC: 232 ug/dL — ABNORMAL LOW (ref 250–450)
UIBC: 131 ug/dL

## 2018-08-31 LAB — FERRITIN: FERRITIN: 190 ng/mL (ref 24–336)

## 2018-08-31 LAB — POCT HEMOGLOBIN-HEMACUE: HEMOGLOBIN: 12 g/dL — AB (ref 13.0–17.0)

## 2018-08-31 MED ORDER — EPOETIN ALFA 10000 UNIT/ML IJ SOLN: 10000.0000 [IU] | INTRAMUSCULAR | Status: DC

## 2018-09-05 ENCOUNTER — Encounter: Payer: Self-pay | Admitting: Family

## 2018-09-05 ENCOUNTER — Ambulatory Visit (INDEPENDENT_AMBULATORY_CARE_PROVIDER_SITE_OTHER): Payer: Medicare Other

## 2018-09-05 DIAGNOSIS — Z23 Encounter for immunization: Secondary | ICD-10-CM

## 2018-09-07 ENCOUNTER — Telehealth: Payer: Self-pay

## 2018-09-07 ENCOUNTER — Encounter (HOSPITAL_COMMUNITY): Payer: Medicare Other

## 2018-09-07 NOTE — Telephone Encounter (Signed)
Call patient and let him know appointment time moved to 10:00 am instead of 4:70 am due to conflict. He can move to 9:00 if he likes.

## 2018-09-07 NOTE — Telephone Encounter (Signed)
Spoke with patient and he is preferring to keep appointment in November so appointment not cancelled.

## 2018-09-11 NOTE — Telephone Encounter (Signed)
Patient's appointment rescheduled to 12/03/18 for next follow up appointment.

## 2018-09-14 ENCOUNTER — Ambulatory Visit (HOSPITAL_COMMUNITY)
Admission: RE | Admit: 2018-09-14 | Discharge: 2018-09-14 | Disposition: A | Discharge status: Home / Self Care | Payer: Medicare Other | Source: Ambulatory Visit | Attending: Nephrology | Admitting: Nephrology

## 2018-09-14 VITALS — BP 156/64 | HR 71 | Temp 97.8°F | Resp 20

## 2018-09-14 DIAGNOSIS — N184 Chronic kidney disease, stage 4 (severe): Secondary | ICD-10-CM | POA: Diagnosis not present

## 2018-09-14 DIAGNOSIS — D631 Anemia in chronic kidney disease: Secondary | ICD-10-CM | POA: Diagnosis not present

## 2018-09-14 DIAGNOSIS — D649 Anemia, unspecified: Secondary | ICD-10-CM

## 2018-09-14 LAB — POCT HEMOGLOBIN-HEMACUE: HEMOGLOBIN: 10.5 g/dL — AB (ref 13.0–17.0)

## 2018-09-14 MED ORDER — EPOETIN ALFA 10000 UNIT/ML IJ SOLN
10000.0000 [IU] | INTRAMUSCULAR | Status: DC
  Administered 2018-09-14: 10000 [IU] via SUBCUTANEOUS

## 2018-09-14 MED ORDER — EPOETIN ALFA 10000 UNIT/ML IJ SOLN
INTRAMUSCULAR | Status: AC
  Administered 2018-09-14: 10000 [IU] via SUBCUTANEOUS
  Filled 2018-09-14: qty 1

## 2018-09-21 ENCOUNTER — Encounter (HOSPITAL_COMMUNITY): Payer: Medicare Other

## 2018-09-24 ENCOUNTER — Ambulatory Visit (HOSPITAL_COMMUNITY)
Admission: RE | Admit: 2018-09-24 | Discharge: 2018-09-24 | Disposition: A | Discharge status: Home / Self Care | Payer: Medicare Other | Source: Ambulatory Visit | Attending: Nephrology | Admitting: Nephrology

## 2018-09-24 VITALS — BP 146/55 | HR 70 | Temp 97.7°F | Resp 20

## 2018-09-24 DIAGNOSIS — N184 Chronic kidney disease, stage 4 (severe): Secondary | ICD-10-CM | POA: Diagnosis present

## 2018-09-24 DIAGNOSIS — D631 Anemia in chronic kidney disease: Secondary | ICD-10-CM | POA: Insufficient documentation

## 2018-09-24 DIAGNOSIS — D649 Anemia, unspecified: Secondary | ICD-10-CM

## 2018-09-24 LAB — POCT HEMOGLOBIN-HEMACUE: Hemoglobin: 10.8 g/dL — ABNORMAL LOW (ref 13.0–17.0)

## 2018-09-24 MED ORDER — EPOETIN ALFA 10000 UNIT/ML IJ SOLN
INTRAMUSCULAR | Status: AC
  Filled 2018-09-24: qty 1

## 2018-09-24 MED ORDER — EPOETIN ALFA 10000 UNIT/ML IJ SOLN
10000.0000 [IU] | INTRAMUSCULAR | Status: DC
  Administered 2018-09-24: 10000 [IU] via SUBCUTANEOUS

## 2018-09-28 ENCOUNTER — Encounter (HOSPITAL_COMMUNITY): Payer: Medicare Other

## 2018-10-01 ENCOUNTER — Ambulatory Visit (HOSPITAL_COMMUNITY)
Admission: RE | Admit: 2018-10-01 | Discharge: 2018-10-01 | Disposition: A | Discharge status: Home / Self Care | Payer: Medicare Other | Source: Ambulatory Visit | Attending: Nephrology | Admitting: Nephrology

## 2018-10-01 VITALS — BP 159/65 | HR 82 | Temp 98.2°F | Resp 20

## 2018-10-01 DIAGNOSIS — N184 Chronic kidney disease, stage 4 (severe): Secondary | ICD-10-CM | POA: Diagnosis present

## 2018-10-01 DIAGNOSIS — D631 Anemia in chronic kidney disease: Secondary | ICD-10-CM | POA: Insufficient documentation

## 2018-10-01 DIAGNOSIS — Z79899 Other long term (current) drug therapy: Secondary | ICD-10-CM | POA: Insufficient documentation

## 2018-10-01 DIAGNOSIS — Z5181 Encounter for therapeutic drug level monitoring: Secondary | ICD-10-CM | POA: Diagnosis not present

## 2018-10-01 DIAGNOSIS — D649 Anemia, unspecified: Secondary | ICD-10-CM

## 2018-10-01 LAB — IRON AND TIBC
Iron: 28 ug/dL — ABNORMAL LOW (ref 45–182)
SATURATION RATIOS: 11 % — AB (ref 17.9–39.5)
TIBC: 253 ug/dL (ref 250–450)
UIBC: 225 ug/dL

## 2018-10-01 LAB — FERRITIN: FERRITIN: 159 ng/mL (ref 24–336)

## 2018-10-01 LAB — POCT HEMOGLOBIN-HEMACUE: HEMOGLOBIN: 10.5 g/dL — AB (ref 13.0–17.0)

## 2018-10-01 MED ORDER — EPOETIN ALFA 10000 UNIT/ML IJ SOLN
10000.0000 [IU] | INTRAMUSCULAR | Status: DC
  Administered 2018-10-01: 10000 [IU] via SUBCUTANEOUS

## 2018-10-01 MED ORDER — EPOETIN ALFA 10000 UNIT/ML IJ SOLN
INTRAMUSCULAR | Status: AC
  Filled 2018-10-01: qty 1

## 2018-10-03 ENCOUNTER — Ambulatory Visit: Payer: Medicare Other | Admitting: Family

## 2018-10-03 ENCOUNTER — Encounter: Payer: Self-pay | Admitting: Family

## 2018-10-03 ENCOUNTER — Emergency Department (HOSPITAL_COMMUNITY): Payer: Medicare Other

## 2018-10-03 ENCOUNTER — Other Ambulatory Visit (INDEPENDENT_AMBULATORY_CARE_PROVIDER_SITE_OTHER): Payer: Medicare Other

## 2018-10-03 ENCOUNTER — Encounter (HOSPITAL_COMMUNITY): Payer: Self-pay | Admitting: *Deleted

## 2018-10-03 ENCOUNTER — Emergency Department (HOSPITAL_COMMUNITY)
Admission: EM | Admit: 2018-10-03 | Discharge: 2018-10-03 | Disposition: A | Discharge status: Home / Self Care | Payer: Medicare Other | Attending: Emergency Medicine | Admitting: Emergency Medicine

## 2018-10-03 ENCOUNTER — Other Ambulatory Visit: Payer: Self-pay

## 2018-10-03 VITALS — BP 138/74 | HR 85 | Temp 98.5°F | Ht 67.0 in | Wt 143.1 lb

## 2018-10-03 DIAGNOSIS — E860 Dehydration: Secondary | ICD-10-CM | POA: Diagnosis not present

## 2018-10-03 DIAGNOSIS — R739 Hyperglycemia, unspecified: Secondary | ICD-10-CM

## 2018-10-03 DIAGNOSIS — Z79899 Other long term (current) drug therapy: Secondary | ICD-10-CM | POA: Insufficient documentation

## 2018-10-03 DIAGNOSIS — Z8546 Personal history of malignant neoplasm of prostate: Secondary | ICD-10-CM | POA: Insufficient documentation

## 2018-10-03 DIAGNOSIS — E1122 Type 2 diabetes mellitus with diabetic chronic kidney disease: Secondary | ICD-10-CM | POA: Insufficient documentation

## 2018-10-03 DIAGNOSIS — Z7982 Long term (current) use of aspirin: Secondary | ICD-10-CM | POA: Diagnosis not present

## 2018-10-03 DIAGNOSIS — I129 Hypertensive chronic kidney disease with stage 1 through stage 4 chronic kidney disease, or unspecified chronic kidney disease: Secondary | ICD-10-CM | POA: Diagnosis not present

## 2018-10-03 DIAGNOSIS — Z87891 Personal history of nicotine dependence: Secondary | ICD-10-CM | POA: Insufficient documentation

## 2018-10-03 DIAGNOSIS — R799 Abnormal finding of blood chemistry, unspecified: Secondary | ICD-10-CM | POA: Insufficient documentation

## 2018-10-03 DIAGNOSIS — N179 Acute kidney failure, unspecified: Secondary | ICD-10-CM | POA: Insufficient documentation

## 2018-10-03 DIAGNOSIS — N184 Chronic kidney disease, stage 4 (severe): Secondary | ICD-10-CM | POA: Diagnosis not present

## 2018-10-03 DIAGNOSIS — R05 Cough: Secondary | ICD-10-CM | POA: Diagnosis present

## 2018-10-03 DIAGNOSIS — R809 Proteinuria, unspecified: Secondary | ICD-10-CM | POA: Diagnosis not present

## 2018-10-03 LAB — CBC WITH DIFFERENTIAL/PLATELET
Abs Immature Granulocytes: 0.04 10*3/uL (ref 0.00–0.07)
BASOS ABS: 0.1 10*3/uL (ref 0.0–0.1)
BASOS ABS: 0 10*3/uL (ref 0.0–0.1)
Basophils Relative: 0.7 % (ref 0.0–3.0)
Basophils Relative: 0 %
EOS PCT: 2 %
Eosinophils Absolute: 0.2 10*3/uL (ref 0.0–0.7)
Eosinophils Absolute: 0.2 10*3/uL (ref 0.0–0.5)
Eosinophils Relative: 2.2 % (ref 0.0–5.0)
HCT: 32.2 % — ABNORMAL LOW (ref 39.0–52.0)
HEMATOCRIT: 36.5 % — AB (ref 39.0–52.0)
HEMOGLOBIN: 10.6 g/dL — AB (ref 13.0–17.0)
HEMOGLOBIN: 11.4 g/dL — AB (ref 13.0–17.0)
Immature Granulocytes: 0 %
LYMPHS ABS: 1.5 10*3/uL (ref 0.7–4.0)
LYMPHS ABS: 1.4 10*3/uL (ref 0.7–4.0)
LYMPHS PCT: 15 %
Lymphocytes Relative: 16.3 % (ref 12.0–46.0)
MCH: 30.5 pg (ref 26.0–34.0)
MCHC: 32.9 g/dL (ref 30.0–36.0)
MCHC: 31.2 g/dL (ref 30.0–36.0)
MCV: 94.3 fl (ref 78.0–100.0)
MCV: 97.6 fL (ref 80.0–100.0)
MONO ABS: 1.1 10*3/uL — AB (ref 0.1–1.0)
MONO ABS: 0.7 10*3/uL (ref 0.1–1.0)
MONOS PCT: 8 %
Monocytes Relative: 11.8 % (ref 3.0–12.0)
NEUTROS PCT: 69 % (ref 43.0–77.0)
NRBC: 0 % (ref 0.0–0.2)
Neutro Abs: 6.2 10*3/uL (ref 1.4–7.7)
Neutro Abs: 6.7 10*3/uL (ref 1.7–7.7)
Neutrophils Relative %: 75 %
Platelets: 193 10*3/uL (ref 150.0–400.0)
Platelets: 182 10*3/uL (ref 150–400)
RBC: 3.42 Mil/uL — AB (ref 4.22–5.81)
RBC: 3.74 MIL/uL — ABNORMAL LOW (ref 4.22–5.81)
RDW: 15.6 % — ABNORMAL HIGH (ref 11.5–15.5)
RDW: 14.8 % (ref 11.5–15.5)
WBC: 9 10*3/uL (ref 4.0–10.5)
WBC: 9 10*3/uL (ref 4.0–10.5)

## 2018-10-03 LAB — COMPREHENSIVE METABOLIC PANEL
ALBUMIN: 3.9 g/dL (ref 3.5–5.0)
ALK PHOS: 118 U/L — AB (ref 39–117)
ALK PHOS: 129 U/L — AB (ref 38–126)
ALT: 36 U/L (ref 0–53)
ALT: 46 U/L — ABNORMAL HIGH (ref 0–44)
ANION GAP: 13 (ref 5–15)
AST: 31 U/L (ref 0–37)
AST: 37 U/L (ref 15–41)
Albumin: 3.9 g/dL (ref 3.5–5.2)
BILIRUBIN TOTAL: 0.7 mg/dL (ref 0.2–1.2)
BILIRUBIN TOTAL: 0.9 mg/dL (ref 0.3–1.2)
BUN: 56 mg/dL — AB (ref 6–23)
BUN: 60 mg/dL — AB (ref 8–23)
CALCIUM: 8.8 mg/dL — AB (ref 8.9–10.3)
CHLORIDE: 99 meq/L (ref 96–112)
CO2: 24 meq/L (ref 19–32)
CO2: 21 mmol/L — ABNORMAL LOW (ref 22–32)
Calcium: 9.3 mg/dL (ref 8.4–10.5)
Chloride: 98 mmol/L (ref 98–111)
Creatinine, Ser: 3.09 mg/dL — ABNORMAL HIGH (ref 0.40–1.50)
Creatinine, Ser: 3.14 mg/dL — ABNORMAL HIGH (ref 0.61–1.24)
GFR calc Af Amer: 19 mL/min — ABNORMAL LOW (ref >60)
GFR calc non Af Amer: 16 mL/min — ABNORMAL LOW (ref >60)
GFR: 20.25 mL/min — ABNORMAL LOW (ref >60.00)
GLUCOSE: 147 mg/dL — AB (ref 70–99)
GLUCOSE: 194 mg/dL — AB (ref 70–99)
POTASSIUM: 4.5 meq/L (ref 3.5–5.1)
Potassium: 4.3 mmol/L (ref 3.5–5.1)
SODIUM: 134 meq/L — AB (ref 135–145)
Sodium: 132 mmol/L — ABNORMAL LOW (ref 135–145)
TOTAL PROTEIN: 8.9 g/dL — AB (ref 6.5–8.1)
Total Protein: 7.9 g/dL (ref 6.0–8.3)

## 2018-10-03 LAB — URINALYSIS, ROUTINE W REFLEX MICROSCOPIC
BILIRUBIN URINE: NEGATIVE
BILIRUBIN URINE: NEGATIVE
GLUCOSE, UA: NEGATIVE mg/dL
KETONES UR: NEGATIVE
KETONES UR: NEGATIVE mg/dL
LEUKOCYTES UA: NEGATIVE
LEUKOCYTES UA: NEGATIVE
NITRITE: NEGATIVE
NITRITE: NEGATIVE
PROTEIN: 100 mg/dL — AB
Specific Gravity, Urine: 1.015 (ref 1.000–1.030)
Specific Gravity, Urine: 1.008 (ref 1.005–1.030)
TOTAL PROTEIN, URINE-UPE24: 100 — AB
URINE GLUCOSE: NEGATIVE
UROBILINOGEN UA: 0.2 (ref 0.0–1.0)
pH: 5.5 (ref 5.0–8.0)
pH: 5 (ref 5.0–8.0)

## 2018-10-03 LAB — LIPASE: LIPASE: 32 U/L (ref 11.0–59.0)

## 2018-10-03 LAB — I-STAT CG4 LACTIC ACID, ED: Lactic Acid, Venous: 0.96 mmol/L (ref 0.5–1.9)

## 2018-10-03 LAB — TSH: TSH: 1.954 u[IU]/mL (ref 0.350–4.500)

## 2018-10-03 LAB — AMMONIA: Ammonia: 24 umol/L (ref 9–35)

## 2018-10-03 LAB — AMYLASE: AMYLASE: 28 U/L (ref 27–131)

## 2018-10-03 LAB — LIPASE, BLOOD: LIPASE: 38 U/L (ref 11–51)

## 2018-10-03 LAB — HEMOGLOBIN A1C: HEMOGLOBIN A1C: 7.1 % — AB (ref 4.6–6.5)

## 2018-10-03 LAB — CBG MONITORING, ED: Glucose-Capillary: 182 mg/dL — ABNORMAL HIGH (ref 70–99)

## 2018-10-03 MED ORDER — NEOMYCIN-POLYMYXIN-HC 3.5-10000-1 OT SOLN: 3.0000 [drp] | Freq: Three times a day (TID) | OTIC | 0 refills | Status: DC

## 2018-10-03 MED ORDER — SODIUM CHLORIDE 0.9 % IV BOLUS
500.0000 mL | Freq: Once | INTRAVENOUS | Status: AC
  Administered 2018-10-03: 500 mL via INTRAVENOUS

## 2018-10-03 NOTE — Discharge Instructions (Signed)
Your labs today revealed evidence of rising creatinine, your kidney function.  We feel you do have acute kidney injury however given your lack of symptoms and our shared decision-making conversation we feel it is reasonable for you to be discharged home for close outpatient follow-up.  As we discussed, we suspect it was a combination of using the diuretic Lasix which contributed to this kidney injury.  Please maintain hydration overnight and call the PCP team in the morning for close follow-up and repeat labs.  Admission was offered however given your support system and your understanding of the plan of care, we agree with the decision for discharge.  If any symptoms change overnight or he develop any new symptoms, please return to the nearest emergency department immediately.

## 2018-10-03 NOTE — ED Provider Notes (Signed)
[facility] DEPT Provider Note   CSN: [id number] Arrival date & time: 10/03/18  1423     History   Chief Complaint Chief Complaint  Patient presents with  . Abnormal Lab    elevated Cr and BUN    HPI Gerald Jacobs is a 84 y.o. male.  The history is provided by the patient, a relative and medical records. No language interpreter was used.  Cough  This is a recurrent problem. The current episode started more than 1 week ago. The problem occurs constantly. The problem has not changed since onset.The cough is productive of sputum. There has been no fever. Associated symptoms include chills and shortness of breath (chronic). Pertinent negatives include no chest pain, no headaches, no rhinorrhea and no wheezing. The treatment provided no relief.    Past Medical History:  Diagnosis Date  . Bilateral shoulder pain 07/28/2016  . Cancer [facility])    prostate  . Chest pain 10/27/2016  . Chronic anemia    With normal iron studies and normal serum protein electrophoresis  . Coronary artery disease    Mild  . Cough 10/27/2016  . Diabetes mellitus    Adult onset  . Essential hypertension   . GERD (gastroesophageal reflux disease)    occ tums  . History of gout   . Hyperbilirubinemia 06/15/2017  . Hyponatremia 06/15/2017  . Intermittent palpitations 07/23/2014  . Liver mass   . Mass of bile duct 06/27/2017  . Mild renal insufficiency   . Nocturia    x2  . Normocytic anemia 06/23/2016  . Osteoarthritis   . Sciatica of right side 07/28/2016  . Statin myopathy 07/28/2016  . Weight loss 06/15/2017    Patient Active Problem List   Diagnosis Date Noted  . 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 Liver mass   . Hyperbilirubinemia 06/15/2017  . Essential hypertension 09/02/2016  . Statin myopathy 07/28/2016  . Normocytic anemia 06/23/2016  . Medicare annual wellness visit, subsequent 01/18/2016  . Routine general medical examination at a health  care facility 01/18/2016  . Frequent PVCs 11/24/2014  . Intermittent palpitations 07/23/2014  . Chronic cholecystitis with calculus 02/04/2014  . Choledocholithiasis 08/28/2013  . Symptomatic cholelithiasis 06/04/2013  . Elevated LFTs 06/04/2013  . Benign hypertensive heart disease without heart failure 09/07/2011  . Type 2 diabetes mellitus with stage 4 chronic kidney disease, without long-term current use of insulin ([place]) 09/07/2011  . Dyslipidemia 09/07/2011  . CKD (chronic kidney disease), stage IV ([place]) 09/07/2011    Past Surgical History:  Procedure Laterality Date  . CHOLECYSTECTOMY  02/04/2014   DR Zella Richer  . CHOLECYSTECTOMY N/A 02/04/2014   Procedure: LAPAROSCOPIC CHOLECYSTECTOMY with intraoperative cholangiogram;  Surgeon: Odis Hollingshead, MD;  Location: [facility];  Service: General;  Laterality: N/A;  . ERCP N/A 10/15/2013   Procedure: ENDOSCOPIC RETROGRADE CHOLANGIOPANCREATOGRAPHY (ERCP);  Surgeon: Jeryl Columbia, MD;  Location: [facility];  Service: Endoscopy;  Laterality: N/A;  . KNEE SURGERY Right 1979  . melanoma surgery  1970's   on scalp  . PROSTATE SURGERY  1993   Prostate Cancer  . SPHINCTEROTOMY  10/15/2013   Procedure: SPHINCTEROTOMY;  Surgeon: Jeryl Columbia, MD;  Location: [facility];  Service: Endoscopy;;        Home Medications    Prior to Admission medications   Medication Sig Start Date End Date Taking? Authorizing Provider  allopurinol (ZYLOPRIM) 100 MG tablet Take 1 tablet (100 mg total) by mouth daily. 08/01/18   Caesar Chestnut  N, NP  amLODipine (NORVASC) 5 MG tablet TAKE 1 TABLET BY MOUTH EVERY DAY 07/24/18   Lance Sell, NP  aspirin 81 MG tablet Take 81 mg by mouth daily.      [provider]  Biotin 1000 MCG tablet Take 1,000 mcg by mouth daily.    [provider]  cholecalciferol (VITAMIN D) 1000 units tablet Take 4,000 Units by mouth daily.     [provider]  feeding supplement, GLUCERNA SHAKE,  (GLUCERNA SHAKE) LIQD Take 237 mLs by mouth 2 (two) times daily between meals. 06/20/17   Robbie Lis, MD  furosemide (LASIX) 20 MG tablet Take 20 mg by mouth daily. 09/25/18   [provider]  glucose blood (ONE TOUCH ULTRA TEST) test strip USE ONE STRIP PER TEST. CHECK BLOOD SUGARS 1-4 TIMES PER DAY AS INSTRUCTED. 08/01/18   Lance Sell, NP  losartan (COZAAR) 50 MG tablet Take 50 mg by mouth daily. 06/28/18   [provider]  metoprolol succinate (TOPROL-XL) 50 MG 24 hr tablet Take 1 tablet (50 mg total) by mouth every morning. 08/16/17   Burtis Junes, NP  neomycin-polymyxin-hydrocortisone (CORTISPORIN) OTIC solution Place 3 drops into the left ear 3 (three) times daily. 10/03/18   Marrian Salvage, FNP  ondansetron (ZOFRAN) 8 MG tablet Take by mouth every 8 (eight) hours as needed for nausea or vomiting.    [provider]  [facility] DELICA LANCETS 45G MISC TEST BLOOD SUGARS 1-4 TIMES A DAY AS INSTRUCTED. DX CODE: E11.9 08/01/18   Lance Sell, NP  pantoprazole (PROTONIX) 40 MG tablet Take 40 mg by mouth daily.  01/26/18 01/26/19  [provider]  promethazine (PHENERGAN) 12.5 MG tablet Take 12.5 mg by mouth every 6 (six) hours as needed for nausea or vomiting.    [provider]  traZODone (DESYREL) 50 MG tablet Take 0.5-1 tablets (25-50 mg total) by mouth at bedtime as needed for sleep. 07/24/17   Golden Circle, FNP  triamcinolone (KENALOG) 0.025 % cream Apply topically.    [provider]  triamcinolone (KENALOG) 0.1 % paste Apply topically.    [provider]  vancomycin (VANCOCIN) 125 MG capsule Take 1 capsule (125 mg total) by mouth 4 (four) times daily. 08/23/18   Marrian Salvage, FNP    Family History Family History  Problem Relation Age of Onset  . Stroke Father   . Stroke Mother   . Stroke Sister   . Stroke Brother   . Stroke Brother   . Stroke Brother     Social History Social History    Tobacco Use  . Smoking status: Former Smoker    Packs/day: 1.00    Years: 45.00    Pack years: 45.00    Types: Cigarettes    Last attempt to quit: 09/07/1979    Years since quitting: 39.0  . Smokeless tobacco: Never Used  Substance Use Topics  . Alcohol use: Yes    Comment: beer rarely  . Drug use: No     Allergies   Augmentin [amoxicillin-pot clavulanate]; Colchicine; Flexeril [cyclobenzaprine hcl]; Levaquin [levofloxacin in d5w]; Percocet [oxycodone-acetaminophen]; Zebeta; and Bisoprolol fumarate   Review of Systems Review of Systems  Constitutional: Positive for chills, diaphoresis and fatigue. Negative for fever.  HENT: Negative for congestion and rhinorrhea.   Respiratory: Positive for cough and shortness of breath (chronic). Negative for chest tightness and wheezing.   Cardiovascular: Negative for chest pain, palpitations and leg swelling.  Gastrointestinal: Positive  for nausea. Negative for abdominal pain, constipation, diarrhea and vomiting.  Genitourinary: Negative for difficulty urinating, flank pain and frequency.       Strong smelling urine  Musculoskeletal: Negative for back pain, neck pain and neck stiffness.  Skin: Negative for rash and wound.  Neurological: Negative for light-headedness and headaches.  Psychiatric/Behavioral: Negative for agitation.  All other systems reviewed and are negative.    Physical Exam Updated Vital Signs BP 140/74 (BP Location: Left Arm)   Pulse 84   Temp 98.3 F (36.8 C)   Resp 20   Wt 65.3 kg   SpO2 96%   BMI 22.54 kg/m   Physical Exam  Constitutional: He is oriented to person, place, and time. He appears well-developed and well-nourished. No distress.  HENT:  Head: Normocephalic and atraumatic.  Nose: Nose normal.  Mouth/Throat: Oropharynx is clear and moist. No oropharyngeal exudate.  Eyes: Pupils are equal, round, and reactive to light. Conjunctivae and EOM are normal.  Neck: Normal range of motion. Neck  supple.  Cardiovascular: Normal rate and regular rhythm.  No murmur heard. Pulmonary/Chest: Effort normal and breath sounds normal. No respiratory distress. He has no wheezes. He has no rales. He exhibits no tenderness.  Abdominal: Soft. There is no tenderness. There is no guarding.  Musculoskeletal: He exhibits no edema or tenderness.  Neurological: He is alert and oriented to person, place, and time. No sensory deficit. He exhibits normal muscle tone.  Skin: Skin is warm and dry. Capillary refill takes less than 2 seconds. He is not diaphoretic. No erythema. No pallor.  Psychiatric: He has a normal mood and affect.  Nursing note and vitals reviewed.    ED Treatments / Results  Labs (all labs ordered are listed, but only abnormal results are displayed) Labs Reviewed  CBC WITH DIFFERENTIAL/PLATELET - Abnormal; Notable for the following components:      Result Value   RBC 3.74 (*)    Hemoglobin 11.4 (*)    HCT 36.5 (*)    All other components within normal limits  COMPREHENSIVE METABOLIC PANEL - Abnormal; Notable for the following components:   Sodium 132 (*)    CO2 21 (*)    Glucose, Bld 194 (*)    BUN 60 (*)    Creatinine, Ser 3.14 (*)    Calcium 8.8 (*)    Total Protein 8.9 (*)    ALT 46 (*)    Alkaline Phosphatase 129 (*)    GFR calc non Af Amer 16 (*)    GFR calc Af Amer 19 (*)    All other components within normal limits  URINALYSIS, ROUTINE W REFLEX MICROSCOPIC - Abnormal; Notable for the following components:   Color, Urine STRAW (*)    Hgb urine dipstick SMALL (*)    Protein, ur 100 (*)    Bacteria, UA RARE (*)    All other components within normal limits  CBG MONITORING, ED - Abnormal; Notable for the following components:   Glucose-Capillary 182 (*)    All other components within normal limits  URINE CULTURE  LIPASE, BLOOD  TSH  AMMONIA  I-STAT CG4 LACTIC ACID, ED    EKG EKG Interpretation  Date/Time:  Wednesday October 03 2018 16:37:34  EST Ventricular Rate:  83 PR Interval:    QRS Duration: 96 QT Interval:  360 QTC Calculation: 423 R Axis:   41 Text Interpretation:  Sinus rhythm Abnormal R-wave progression, early transition No prio rECG for comparison.  No sTEMI Confirmed by Tegeler, Gerald Stabs (  [id number]) on 10/03/2018 4:58:36 PM   Radiology Dg Chest 2 View  Result Date: 10/03/2018 CLINICAL DATA:  Chills.  Cough and fatigue. EXAM: CHEST - 2 VIEW COMPARISON:  11/17/2017. FINDINGS: Stable cardiomediastinal silhouette. No consolidation or edema. No effusion or pneumothorax. Moderate hyperinflation. Thoracic atherosclerosis. IMPRESSION: No active cardiopulmonary disease.  Similar appearance to priors. Electronically Signed   By: Staci Righter M.D.   On: 10/03/2018 18:20    Procedures Procedures (including critical care time)  Medications Ordered in ED Medications  sodium chloride 0.9 % bolus 500 mL (0 mLs Intravenous Stopped 10/03/18 1853)     Initial Impression / Assessment and Plan / ED Course  I have reviewed the triage vital signs and the nursing notes.  Pertinent labs & imaging results that were available during my care of the patient were reviewed by me and considered in my medical decision making (see chart for details).     Gerald Jacobs is a 84 y.o. male with a past medical history significant for diabetes, CKD, intrahepatic cholangiocarcinoma, hypertension, and CAD who presents at the direction of his PCP for evaluation after worsening kidney function.  According to patient and patient's daughter, he was started on Lasix last Monday and has been taking it ever since.  Family reports there was a discrepancy between instructions and prescription however he has been taking it daily.  He reports that he has felt very dehydrated with dry skin, and dry mouth.  He has been feeling very malaise, chills, shaking, achiness, and productive cough.  He reports that his urine smelled stronger several days ago but was not burning or  dysuria.  He denies any chest pain, palpitations.  He reports nausea but no vomiting.  He reports a productive cough that he has had for "sometime".  He denies any syncopal episodes or lightheadedness.  Due to the patient's malaise, fatigue, chills, and symptoms in the setting of reportedly worsening kidney function patient was told to come to the ED for work-up for occult infection and reevaluation.  On exam, lungs were clear.  Chest was nontender.  Abdomen was nontender.  Legs were nonedematous.  Patient had dry mucous membranes and some dry skin appearance.  Patient was alert and oriented and had no focal deficits on my initial exam.  Chart review shows that patient's kidney function is normally in the low 2-2.5 range.  His creatinine was reportedly over 3 today.  Patient will have repeat blood work to reevaluate he will also be given fluids as he appears dehydrated either from accommodation occult infection or hypovolemia with the new Lasix.  Anticipate reassessment after work-up to determine disposition.  8:40 PM Patient's labs show slightly worsened kidney injury from prior with a creatinine of 3.1 on reassessment.  BNP also more elevated.  Other labs were similar to prior.  No evidence of infection on urinalysis and chest x-ray.    Shared decision-making conversation had with patient and family.  Given patient's lack of symptoms at this time and is well appearance and his ability to maintain hydration, decision was made to have the patient discharged home and follow-up with PCP tomorrow for repeat labs.  Patient does not have diarrhea or vomiting and is able to maintain hydration.  Suspect diuresis contributed to the AKI.  Patient agrees to stop the Lasix and see the doctor tomorrow.  Patient and family all agree with plan of care and he understands return precautions for any new or worsened symptoms.  Next  Patient no  other worsens or concerns and was discharged in good condition.      Final  Clinical Impressions(s) / ED Diagnoses   Final diagnoses:  AKI (acute kidney injury) ([place])  Dehydration    ED Discharge Orders    None      Clinical Impression: 1. AKI (acute kidney injury) ([place])   2. Dehydration     Disposition: Discharge  Condition: Good  I have discussed the results, Dx and Tx plan with the pt(& family if present). He/she/they expressed understanding and agree(s) with the plan. Discharge instructions discussed at great length. Strict return precautions discussed and pt &/or family have verbalized understanding of the instructions. No further questions at time of discharge.    New Prescriptions   No medications on file    Follow Up: Marrian Salvage, [place] [id number] [place] DEPT Portal [id number] [place]       Tegeler, Gwenyth Allegra, MD 10/03/18 [phone]

## 2018-10-03 NOTE — Progress Notes (Signed)
Gerald Jacobs is a 84 y.o. male with the following history as recorded in EpicCare:  Patient Active Problem List   Diagnosis Date Noted  . 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 Liver mass   . Hyperbilirubinemia 06/15/2017  . Essential hypertension 09/02/2016  . Statin myopathy 07/28/2016  . Normocytic anemia 06/23/2016  . Medicare annual wellness visit, subsequent 01/18/2016  . Routine general medical examination at a health care facility 01/18/2016  . Frequent PVCs 11/24/2014  . Intermittent palpitations 07/23/2014  . Chronic cholecystitis with calculus 02/04/2014  . Choledocholithiasis 08/28/2013  . Symptomatic cholelithiasis 06/04/2013  . Elevated LFTs 06/04/2013  . Benign hypertensive heart disease without heart failure 09/07/2011  . Type 2 diabetes mellitus with stage 4 chronic kidney disease, without long-term current use of insulin ([place]) 09/07/2011  . Dyslipidemia 09/07/2011  . CKD (chronic kidney disease), stage IV ([place]) 09/07/2011    Current Outpatient Medications  Medication Sig Dispense Refill  . allopurinol (ZYLOPRIM) 100 MG tablet Take 1 tablet (100 mg total) by mouth daily. 90 tablet 0  . amLODipine (NORVASC) 5 MG tablet TAKE 1 TABLET BY MOUTH EVERY DAY 30 tablet 3  . aspirin 81 MG tablet Take 81 mg by mouth daily.      . Biotin 1000 MCG tablet Take 1,000 mcg by mouth daily.    . cholecalciferol (VITAMIN D) 1000 units tablet Take 4,000 Units by mouth daily.     . feeding supplement, GLUCERNA SHAKE, (GLUCERNA SHAKE) LIQD Take 237 mLs by mouth 2 (two) times daily between meals. 237 mL 0  . furosemide (LASIX) 20 MG tablet Take 20 mg by mouth daily.  0  . glucose blood (ONE TOUCH ULTRA TEST) test strip USE ONE STRIP PER TEST. CHECK BLOOD SUGARS 1-4 TIMES PER DAY AS INSTRUCTED. 100 each 4  . losartan (COZAAR) 50 MG tablet Take 50 mg by mouth daily.  1  . metoprolol succinate (TOPROL-XL) 50 MG 24 hr tablet Take 1 tablet (50 mg total) by mouth every morning.  90 tablet 1  . neomycin-polymyxin-hydrocortisone (CORTISPORIN) OTIC solution Place 3 drops into the left ear 3 (three) times daily. 10 mL 0  . ondansetron (ZOFRAN) 8 MG tablet Take by mouth every 8 (eight) hours as needed for nausea or vomiting.    [facility] DELICA LANCETS 78E MISC TEST BLOOD SUGARS 1-4 TIMES A DAY AS INSTRUCTED. DX CODE: E11.9 100 each 2  . pantoprazole (PROTONIX) 40 MG tablet Take 40 mg by mouth daily.     . promethazine (PHENERGAN) 12.5 MG tablet Take 12.5 mg by mouth every 6 (six) hours as needed for nausea or vomiting.    . traZODone (DESYREL) 50 MG tablet Take 0.5-1 tablets (25-50 mg total) by mouth at bedtime as needed for sleep. 30 tablet 3  . triamcinolone (KENALOG) 0.025 % cream Apply topically.    . triamcinolone (KENALOG) 0.1 % paste Apply topically.    . vancomycin (VANCOCIN) 125 MG capsule Take 1 capsule (125 mg total) by mouth 4 (four) times daily. 40 capsule 0   No current facility-administered medications for this visit.     Allergies: Augmentin [amoxicillin-pot clavulanate]; Colchicine; Flexeril [cyclobenzaprine hcl]; Levaquin [levofloxacin in d5w]; Percocet [oxycodone-acetaminophen]; Zebeta; and Bisoprolol fumarate  Past Medical History:  Diagnosis Date  . Bilateral shoulder pain 07/28/2016  . Cancer [facility])    prostate  . Chest pain 10/27/2016  . Chronic anemia    With normal iron studies and normal serum protein electrophoresis  . Coronary artery disease  Mild  . Cough 10/27/2016  . Diabetes mellitus    Adult onset  . Essential hypertension   . GERD (gastroesophageal reflux disease)    occ tums  . History of gout   . Hyperbilirubinemia 06/15/2017  . Hyponatremia 06/15/2017  . Intermittent palpitations 07/23/2014  . Liver mass   . Mass of bile duct 06/27/2017  . Mild renal insufficiency   . Nocturia    x2  . Normocytic anemia 06/23/2016  . Osteoarthritis   . Sciatica of right side 07/28/2016  . Statin myopathy 07/28/2016  . Weight loss  06/15/2017    Past Surgical History:  Procedure Laterality Date  . CHOLECYSTECTOMY  02/04/2014   DR Zella Richer  . CHOLECYSTECTOMY N/A 02/04/2014   Procedure: LAPAROSCOPIC CHOLECYSTECTOMY with intraoperative cholangiogram;  Surgeon: Odis Hollingshead, MD;  Location: [facility];  Service: General;  Laterality: N/A;  . ERCP N/A 10/15/2013   Procedure: ENDOSCOPIC RETROGRADE CHOLANGIOPANCREATOGRAPHY (ERCP);  Surgeon: Jeryl Columbia, MD;  Location: [facility];  Service: Endoscopy;  Laterality: N/A;  . KNEE SURGERY Right 1979  . melanoma surgery  1970's   on scalp  . PROSTATE SURGERY  1993   Prostate Cancer  . SPHINCTEROTOMY  10/15/2013   Procedure: SPHINCTEROTOMY;  Surgeon: Jeryl Columbia, MD;  Location: [facility];  Service: Endoscopy;;    Family History  Problem Relation Age of Onset  . Stroke Father   . Stroke Mother   . Stroke Sister   . Stroke Brother   . Stroke Brother   . Stroke Brother     Social History   Tobacco Use  . Smoking status: Former Smoker    Packs/day: 1.00    Years: 45.00    Pack years: 45.00    Types: Cigarettes    Last attempt to quit: 09/07/1979    Years since quitting: 39.0  . Smokeless tobacco: Never Used  Substance Use Topics  . Alcohol use: Yes    Comment: beer rarely    Subjective:  Presents with his daughter today; notes that he had chills yesterday and was visibly shaking; denies any fever but notes his blood sugar was extremely elevated at 350; has not had to take medications for his diabetes in the past 12 months; last Hgba1c earlier this year was well controlled at 6.2; opted to take Januvia which he used to take and has been feeling better; took again this morning and sugar was down to 181; per daughter, they have noticed some blood sugar elevations over the past few weeks; Denies any chest pain, shortness of breath, blurred vision or headache. Denies any abdominal pain, nausea or vomiting; overall, has been doing very well.     Objective:   Vitals:   10/03/18 1009  BP: 138/74  Pulse: 85  Temp: 98.5 F (36.9 C)  TempSrc: Oral  SpO2: 99%  Weight: 143 lb 1.9 oz (64.9 kg)  Height: '5\' 7"'  (1.702 m)    General: Well developed, well nourished, in no acute distress  Skin : Warm and dry.  Head: Normocephalic and atraumatic  Eyes: Sclera and conjunctiva clear; pupils round and reactive to light; extraocular movements intact  Ears: External normal; canals clear; tympanic membranes normal  Oropharynx: Pink, supple. No suspicious lesions  Neck: Supple without thyromegaly, adenopathy  Lungs: Respirations unlabored; clear to auscultation bilaterally without wheeze, rales, rhonchi  CVS exam: normal rate and regular rhythm.  Abdomen: Soft; nontender; nondistended; normoactive bowel sounds; no masses or hepatosplenomegaly  Neurologic: Alert and oriented; speech intact;  face symmetrical; moves all extremities well; CNII-XII intact without focal deficit   Assessment:  1. Hyperglycemia     Plan:  Concern for sudden spike and extreme spike in blood sugars in the past few weeks; need to rule out underlying infection; will update labs today- follow-up to be determined.   Also refilled ear drops as requested by patient;   No follow-ups on file.  Orders Placed This Encounter  Procedures  . CBC w/Diff    Standing Status:   Future    Number of Occurrences:   1    Standing Expiration Date:   10/03/2019  . Comp Met (CMET)    Standing Status:   Future    Number of Occurrences:   1    Standing Expiration Date:   10/03/2019  . HgB A1c    Standing Status:   Future    Number of Occurrences:   1    Standing Expiration Date:   10/03/2019  . Amylase    Standing Status:   Future    Number of Occurrences:   1    Standing Expiration Date:   10/03/2019  . Lipase    Standing Status:   Future    Number of Occurrences:   1    Standing Expiration Date:   10/03/2019    Requested Prescriptions   Signed Prescriptions Disp Refills  .  neomycin-polymyxin-hydrocortisone (CORTISPORIN) OTIC solution 10 mL 0    Sig: Place 3 drops into the left ear 3 (three) times daily.

## 2018-10-03 NOTE — ED Notes (Signed)
Patient transported to X-ray 

## 2018-10-03 NOTE — ED Triage Notes (Signed)
Pt daughter states the pt's BUN and Cr were high. Pt was recently put on Lasix ~1 week ago before the labs showed abnormal Cr and BUN levels. Pt has hx of end stage renal disease.

## 2018-10-03 NOTE — ED Notes (Signed)
Pt given urinal and made aware of need for urine specimen 

## 2018-10-03 NOTE — ED Notes (Signed)
Bed: WA11 Expected date:  Expected time:  Means of arrival:  Comments: [place]

## 2018-10-04 ENCOUNTER — Ambulatory Visit: Payer: Medicare Other | Admitting: Family

## 2018-10-04 ENCOUNTER — Encounter: Payer: Self-pay | Admitting: Family

## 2018-10-04 ENCOUNTER — Other Ambulatory Visit: Payer: Self-pay | Admitting: Family

## 2018-10-04 ENCOUNTER — Other Ambulatory Visit (INDEPENDENT_AMBULATORY_CARE_PROVIDER_SITE_OTHER): Payer: Medicare Other

## 2018-10-04 VITALS — BP 130/60 | HR 77 | Temp 98.2°F | Ht 67.0 in

## 2018-10-04 DIAGNOSIS — N179 Acute kidney failure, unspecified: Secondary | ICD-10-CM

## 2018-10-04 LAB — COMPREHENSIVE METABOLIC PANEL
ALBUMIN: 3.8 g/dL (ref 3.5–5.2)
ALT: 33 U/L (ref 0–53)
AST: 28 U/L (ref 0–37)
Alkaline Phosphatase: 117 U/L (ref 39–117)
BUN: 55 mg/dL — ABNORMAL HIGH (ref 6–23)
CALCIUM: 9 mg/dL (ref 8.4–10.5)
CO2: 22 meq/L (ref 19–32)
Chloride: 99 mEq/L (ref 96–112)
Creatinine, Ser: 2.94 mg/dL — ABNORMAL HIGH (ref 0.40–1.50)
GFR: 21.45 mL/min — AB (ref >60.00)
Glucose, Bld: 173 mg/dL — ABNORMAL HIGH (ref 70–99)
POTASSIUM: 4.3 meq/L (ref 3.5–5.1)
Sodium: 132 mEq/L — ABNORMAL LOW (ref 135–145)
Total Bilirubin: 0.6 mg/dL (ref 0.2–1.2)
Total Protein: 7.6 g/dL (ref 6.0–8.3)

## 2018-10-04 LAB — CBC WITH DIFFERENTIAL/PLATELET
BASOS PCT: 0.6 % (ref 0.0–3.0)
Basophils Absolute: 0 10*3/uL (ref 0.0–0.1)
EOS ABS: 0.2 10*3/uL (ref 0.0–0.7)
EOS PCT: 2.6 % (ref 0.0–5.0)
HEMATOCRIT: 30.9 % — AB (ref 39.0–52.0)
HEMOGLOBIN: 10.3 g/dL — AB (ref 13.0–17.0)
LYMPHS PCT: 16.2 % (ref 12.0–46.0)
Lymphs Abs: 1.3 10*3/uL (ref 0.7–4.0)
MCHC: 33.5 g/dL (ref 30.0–36.0)
MCV: 93.5 fl (ref 78.0–100.0)
MONOS PCT: 11.3 % (ref 3.0–12.0)
Monocytes Absolute: 0.9 10*3/uL (ref 0.1–1.0)
Neutro Abs: 5.7 10*3/uL (ref 1.4–7.7)
Neutrophils Relative %: 69.3 % (ref 43.0–77.0)
Platelets: 201 10*3/uL (ref 150.0–400.0)
RBC: 3.3 Mil/uL — ABNORMAL LOW (ref 4.22–5.81)
RDW: 15.7 % — AB (ref 11.5–15.5)
WBC: 8.3 10*3/uL (ref 4.0–10.5)

## 2018-10-04 NOTE — Progress Notes (Signed)
Reviewed with daughter; patient will plan to come back for labs next Tuesday/ Wednesday as discussed;

## 2018-10-04 NOTE — Progress Notes (Signed)
Gerald Jacobs is a 84 y.o. male with the following history as recorded in EpicCare:  Patient Active Problem List   Diagnosis Date Noted  . Intrahepatic cholangiocarcinoma ([place]) 06/27/2017  . Liver mass   . Hyperbilirubinemia 06/15/2017  . Essential hypertension 09/02/2016  . Statin myopathy 07/28/2016  . Normocytic anemia 06/23/2016  . Medicare annual wellness visit, subsequent 01/18/2016  . Routine general medical examination at a health care facility 01/18/2016  . Frequent PVCs 11/24/2014  . Intermittent palpitations 07/23/2014  . Chronic cholecystitis with calculus 02/04/2014  . Choledocholithiasis 08/28/2013  . Symptomatic cholelithiasis 06/04/2013  . Elevated LFTs 06/04/2013  . Benign hypertensive heart disease without heart failure 09/07/2011  . Type 2 diabetes mellitus with stage 4 chronic kidney disease, without long-term current use of insulin ([place]) 09/07/2011  . Dyslipidemia 09/07/2011  . CKD (chronic kidney disease), stage IV ([place]) 09/07/2011    Current Outpatient Medications  Medication Sig Dispense Refill  . allopurinol (ZYLOPRIM) 100 MG tablet Take 1 tablet (100 mg total) by mouth daily. 90 tablet 0  . amLODipine (NORVASC) 5 MG tablet TAKE 1 TABLET BY MOUTH EVERY DAY 30 tablet 3  . aspirin 81 MG tablet Take 81 mg by mouth daily.      . Biotin 1000 MCG tablet Take 1,000 mcg by mouth daily.    . cholecalciferol (VITAMIN D) 1000 units tablet Take 2,000 Units by mouth daily.     . diphenhydramine-acetaminophen (TYLENOL PM) 25-500 MG TABS tablet Take 1 tablet by mouth at bedtime as needed (pain).    Marland Kitchen glucose blood (ONE TOUCH ULTRA TEST) test strip USE ONE STRIP PER TEST. CHECK BLOOD SUGARS 1-4 TIMES PER DAY AS INSTRUCTED. 100 each 4  . losartan (COZAAR) 50 MG tablet Take 50 mg by mouth daily.  1  . metoprolol succinate (TOPROL-XL) 50 MG 24 hr tablet Take 1 tablet (50 mg total) by mouth every morning. 90 tablet 1  . neomycin-polymyxin-hydrocortisone (CORTISPORIN) OTIC  solution Place 3 drops into the left ear 3 (three) times daily. 10 mL 0  . ondansetron (ZOFRAN) 8 MG tablet Take by mouth every 8 (eight) hours as needed for nausea or vomiting.    Glory Rosebush DELICA LANCETS 74B MISC TEST BLOOD SUGARS 1-4 TIMES A DAY AS INSTRUCTED. DX CODE: E11.9 100 each 2  . pantoprazole (PROTONIX) 40 MG tablet Take 40 mg by mouth daily as needed (indigestion).     . promethazine (PHENERGAN) 12.5 MG tablet Take 12.5 mg by mouth every 6 (six) hours as needed for nausea or vomiting.    . protein supplement shake (PREMIER PROTEIN) LIQD Take 2 oz by mouth daily.    . traZODone (DESYREL) 50 MG tablet Take 0.5-1 tablets (25-50 mg total) by mouth at bedtime as needed for sleep. 30 tablet 3  . triamcinolone (KENALOG) 0.025 % cream Apply 1 application topically 2 (two) times daily as needed (itching).     . triamcinolone (KENALOG) 0.1 % paste Use as directed 1 application in the mouth or throat 2 (two) times daily.     . feeding supplement, GLUCERNA SHAKE, (GLUCERNA SHAKE) LIQD Take 237 mLs by mouth 2 (two) times daily between meals. (Patient not taking: Reported on 10/03/2018) 237 mL 0  . furosemide (LASIX) 20 MG tablet Take 20 mg by mouth daily.  0   No current facility-administered medications for this visit.     Allergies: Augmentin [amoxicillin-pot clavulanate]; Colchicine; Flexeril [cyclobenzaprine hcl]; Levaquin [levofloxacin in d5w]; Percocet [oxycodone-acetaminophen]; Zebeta; and Bisoprolol fumarate  Past Medical History:  Diagnosis Date  . Bilateral shoulder pain 07/28/2016  . Cancer [facility])    prostate  . Chest pain 10/27/2016  . Chronic anemia    With normal iron studies and normal serum protein electrophoresis  . Coronary artery disease    Mild  . Cough 10/27/2016  . Diabetes mellitus    Adult onset  . Essential hypertension   . GERD (gastroesophageal reflux disease)    occ tums  . History of gout   . Hyperbilirubinemia 06/15/2017  . Hyponatremia 06/15/2017  .  Intermittent palpitations 07/23/2014  . Liver mass   . Mass of bile duct 06/27/2017  . Mild renal insufficiency   . Nocturia    x2  . Normocytic anemia 06/23/2016  . Osteoarthritis   . Sciatica of right side 07/28/2016  . Statin myopathy 07/28/2016  . Weight loss 06/15/2017    Past Surgical History:  Procedure Laterality Date  . CHOLECYSTECTOMY  02/04/2014   DR Zella Richer  . CHOLECYSTECTOMY N/A 02/04/2014   Procedure: LAPAROSCOPIC CHOLECYSTECTOMY with intraoperative cholangiogram;  Surgeon: Odis Hollingshead, MD;  Location: [place];  Service: General;  Laterality: N/A;  . ERCP N/A 10/15/2013   Procedure: ENDOSCOPIC RETROGRADE CHOLANGIOPANCREATOGRAPHY (ERCP);  Surgeon: Jeryl Columbia, MD;  [facility];  Service: Endoscopy;  Laterality: N/A;  . KNEE SURGERY Right 1979  . melanoma surgery  1970's   on scalp  . PROSTATE SURGERY  1993   Prostate Cancer  . SPHINCTEROTOMY  10/15/2013   Procedure: SPHINCTEROTOMY;  Surgeon: Jeryl Columbia, MD;  Location: [facility];  Service: Endoscopy;;    Family History  Problem Relation Age of Onset  . Stroke Father   . Stroke Mother   . Stroke Sister   . Stroke Brother   . Stroke Brother   . Stroke Brother     Social History   Tobacco Use  . Smoking status: Former Smoker    Packs/day: 1.00    Years: 45.00    Pack years: 45.00    Types: Cigarettes    Last attempt to quit: 09/07/1979    Years since quitting: 39.1  . Smokeless tobacco: Never Used  Substance Use Topics  . Alcohol use: Yes    Comment: beer rarely    Subjective:  Patient was sent to ER yesterday with concerns for AKI/ elevated blood sugar; felt to be complication from Lasix; was given IV fluids, had CXR done; debate about whether to admit but since he was feeling so well opted for close outpatient follow-up instead; is feeling some better today; needs to get his labs re-checked today; daughter has reached out to his nephrologist to determine how to manage the Lasix.    Objective:  Vitals:   10/04/18 0953  BP: 130/60  Pulse: 77  Temp: 98.2 F (36.8 C)  TempSrc: Oral  SpO2: 99%  Height: _0  (1.702 m)    General: Well developed, well nourished, in no acute distress  Skin : Warm and dry.  Head: Normocephalic and atraumatic  Eyes: Sclera and conjunctiva clear; pupils round and reactive to light; extraocular movements intact  Ears: External normal; canals clear; tympanic membranes normal  Oropharynx: Pink, supple. No suspicious lesions  Neck: Supple without thyromegaly, adenopathy  Lungs: Respirations unlabored; clear to auscultation bilaterally without wheeze, rales, rhonchi  CVS exam: normal rate and regular rhythm.  Neurologic: Alert and oriented; speech intact; face symmetrical; moves all extremities well; CNII-XII intact without focal deficit   Assessment:  1. AKI (  acute kidney injury) [facility])     Plan:  Physical exam is very reassuring today; will update labs as requested; assuming Serum Creatinine continues to improve, will plan to re-check labs next week for interim follow-up before he sees his specialist at [facility] on 11/22. Patient in agreement with plan;    No follow-ups on file.  Orders Placed This Encounter  Procedures  . CBC w/Diff    Standing Status:   Future    Number of Occurrences:   1    Standing Expiration Date:   10/04/2019  . Comp Met (CMET)    Standing Status:   Future    Number of Occurrences:   1    Standing Expiration Date:   10/04/2019    Requested Prescriptions    No prescriptions requested or ordered in this encounter

## 2018-10-05 LAB — URINE CULTURE: CULTURE: NO GROWTH

## 2018-10-08 ENCOUNTER — Ambulatory Visit (HOSPITAL_COMMUNITY)
Admission: RE | Admit: 2018-10-08 | Discharge: 2018-10-08 | Disposition: A | Discharge status: Home / Self Care | Payer: Medicare Other | Source: Ambulatory Visit | Attending: Nephrology | Admitting: Nephrology

## 2018-10-08 VITALS — BP 151/57 | HR 86 | Temp 97.9°F | Resp 20

## 2018-10-08 DIAGNOSIS — D649 Anemia, unspecified: Secondary | ICD-10-CM | POA: Diagnosis present

## 2018-10-08 LAB — POCT HEMOGLOBIN-HEMACUE: Hemoglobin: 9.9 g/dL — ABNORMAL LOW (ref 13.0–17.0)

## 2018-10-08 MED ORDER — EPOETIN ALFA 10000 UNIT/ML IJ SOLN: 10000.0000 [IU] | INTRAMUSCULAR | Status: DC

## 2018-10-08 MED ORDER — EPOETIN ALFA 10000 UNIT/ML IJ SOLN
INTRAMUSCULAR | Status: AC
  Administered 2018-10-08: 10000 [IU]
  Filled 2018-10-08: qty 1

## 2018-10-10 ENCOUNTER — Other Ambulatory Visit (INDEPENDENT_AMBULATORY_CARE_PROVIDER_SITE_OTHER): Payer: Medicare Other

## 2018-10-10 ENCOUNTER — Encounter: Payer: Self-pay | Admitting: Family

## 2018-10-10 DIAGNOSIS — N179 Acute kidney failure, unspecified: Secondary | ICD-10-CM | POA: Diagnosis not present

## 2018-10-10 LAB — COMPREHENSIVE METABOLIC PANEL
ALK PHOS: 118 U/L — AB (ref 39–117)
ALT: 20 U/L (ref 0–53)
AST: 16 U/L (ref 0–37)
Albumin: 3.7 g/dL (ref 3.5–5.2)
BILIRUBIN TOTAL: 0.4 mg/dL (ref 0.2–1.2)
BUN: 61 mg/dL — ABNORMAL HIGH (ref 6–23)
CO2: 22 meq/L (ref 19–32)
CREATININE: 2.75 mg/dL — AB (ref 0.40–1.50)
Calcium: 8.8 mg/dL (ref 8.4–10.5)
Chloride: 102 mEq/L (ref 96–112)
GFR: 23.17 mL/min — ABNORMAL LOW (ref >60.00)
GLUCOSE: 195 mg/dL — AB (ref 70–99)
Potassium: 4.5 mEq/L (ref 3.5–5.1)
Sodium: 135 mEq/L (ref 135–145)
TOTAL PROTEIN: 7.6 g/dL (ref 6.0–8.3)

## 2018-10-10 LAB — CBC WITH DIFFERENTIAL/PLATELET
Basophils Absolute: 0 10*3/uL (ref 0.0–0.1)
Basophils Relative: 0.6 % (ref 0.0–3.0)
EOS ABS: 0.3 10*3/uL (ref 0.0–0.7)
Eosinophils Relative: 3.8 % (ref 0.0–5.0)
HCT: 29.9 % — ABNORMAL LOW (ref 39.0–52.0)
HEMOGLOBIN: 10 g/dL — AB (ref 13.0–17.0)
LYMPHS ABS: 1.6 10*3/uL (ref 0.7–4.0)
Lymphocytes Relative: 20.3 % (ref 12.0–46.0)
MCHC: 33.4 g/dL (ref 30.0–36.0)
MCV: 92.9 fl (ref 78.0–100.0)
Monocytes Absolute: 0.8 10*3/uL (ref 0.1–1.0)
Monocytes Relative: 9.5 % (ref 3.0–12.0)
NEUTROS ABS: 5.3 10*3/uL (ref 1.4–7.7)
NEUTROS PCT: 65.8 % (ref 43.0–77.0)
PLATELETS: 242 10*3/uL (ref 150.0–400.0)
RBC: 3.22 Mil/uL — ABNORMAL LOW (ref 4.22–5.81)
RDW: 15.9 % — AB (ref 11.5–15.5)
WBC: 8.1 10*3/uL (ref 4.0–10.5)

## 2018-10-10 NOTE — Telephone Encounter (Signed)
For you or I can call him if you want me to.

## 2018-10-15 ENCOUNTER — Ambulatory Visit (HOSPITAL_COMMUNITY)
Admission: RE | Admit: 2018-10-15 | Discharge: 2018-10-15 | Disposition: A | Discharge status: Home / Self Care | Payer: Medicare Other | Source: Ambulatory Visit | Attending: Nephrology | Admitting: Nephrology

## 2018-10-15 ENCOUNTER — Ambulatory Visit: Payer: Medicare Other | Admitting: Nurse Practitioner

## 2018-10-15 ENCOUNTER — Ambulatory Visit: Payer: Medicare Other | Admitting: Family

## 2018-10-15 VITALS — BP 154/62 | HR 79 | Temp 97.9°F | Resp 20

## 2018-10-15 DIAGNOSIS — D649 Anemia, unspecified: Secondary | ICD-10-CM | POA: Diagnosis not present

## 2018-10-15 MED ORDER — EPOETIN ALFA 10000 UNIT/ML IJ SOLN
10000.0000 [IU] | INTRAMUSCULAR | Status: DC
  Administered 2018-10-15: 10000 [IU] via SUBCUTANEOUS

## 2018-10-15 MED ORDER — EPOETIN ALFA 10000 UNIT/ML IJ SOLN
INTRAMUSCULAR | Status: AC
  Filled 2018-10-15: qty 1

## 2018-10-16 LAB — POCT HEMOGLOBIN-HEMACUE: Hemoglobin: 10.4 g/dL — ABNORMAL LOW (ref 13.0–17.0)

## 2018-10-22 ENCOUNTER — Encounter (HOSPITAL_COMMUNITY): Payer: Medicare Other

## 2018-10-22 ENCOUNTER — Ambulatory Visit (HOSPITAL_COMMUNITY)
Admission: RE | Admit: 2018-10-22 | Discharge: 2018-10-22 | Disposition: A | Discharge status: Home / Self Care | Payer: Medicare Other | Source: Ambulatory Visit | Attending: Nephrology | Admitting: Nephrology

## 2018-10-22 VITALS — BP 148/70 | HR 79 | Temp 98.0°F | Resp 18

## 2018-10-22 DIAGNOSIS — Z5181 Encounter for therapeutic drug level monitoring: Secondary | ICD-10-CM | POA: Insufficient documentation

## 2018-10-22 DIAGNOSIS — D631 Anemia in chronic kidney disease: Secondary | ICD-10-CM | POA: Diagnosis not present

## 2018-10-22 DIAGNOSIS — N184 Chronic kidney disease, stage 4 (severe): Secondary | ICD-10-CM | POA: Diagnosis not present

## 2018-10-22 DIAGNOSIS — Z79899 Other long term (current) drug therapy: Secondary | ICD-10-CM | POA: Insufficient documentation

## 2018-10-22 DIAGNOSIS — D649 Anemia, unspecified: Secondary | ICD-10-CM

## 2018-10-22 LAB — POCT HEMOGLOBIN-HEMACUE: HEMOGLOBIN: 10.5 g/dL — AB (ref 13.0–17.0)

## 2018-10-22 MED ORDER — EPOETIN ALFA 10000 UNIT/ML IJ SOLN
10000.0000 [IU] | INTRAMUSCULAR | Status: DC
  Administered 2018-10-22: 10000 [IU] via SUBCUTANEOUS

## 2018-10-23 ENCOUNTER — Other Ambulatory Visit: Payer: Self-pay | Admitting: Nurse Practitioner

## 2018-10-29 ENCOUNTER — Ambulatory Visit (HOSPITAL_COMMUNITY)
Admission: RE | Admit: 2018-10-29 | Discharge: 2018-10-29 | Disposition: A | Discharge status: Home / Self Care | Payer: Medicare Other | Source: Ambulatory Visit | Attending: Nephrology | Admitting: Nephrology

## 2018-10-29 VITALS — BP 166/67 | HR 84 | Temp 97.9°F | Resp 20

## 2018-10-29 DIAGNOSIS — D649 Anemia, unspecified: Secondary | ICD-10-CM

## 2018-10-29 DIAGNOSIS — N184 Chronic kidney disease, stage 4 (severe): Secondary | ICD-10-CM | POA: Diagnosis not present

## 2018-10-29 DIAGNOSIS — D631 Anemia in chronic kidney disease: Secondary | ICD-10-CM | POA: Diagnosis not present

## 2018-10-29 LAB — IRON AND TIBC
Iron: 24 ug/dL — ABNORMAL LOW (ref 45–182)
Saturation Ratios: 11 % — ABNORMAL LOW (ref 17.9–39.5)
TIBC: 210 ug/dL — ABNORMAL LOW (ref 250–450)
UIBC: 186 ug/dL

## 2018-10-29 LAB — FERRITIN: Ferritin: 136 ng/mL (ref 24–336)

## 2018-10-29 LAB — POCT HEMOGLOBIN-HEMACUE: Hemoglobin: 9.7 g/dL — ABNORMAL LOW (ref 13.0–17.0)

## 2018-10-29 MED ORDER — EPOETIN ALFA 10000 UNIT/ML IJ SOLN
INTRAMUSCULAR | Status: AC
  Filled 2018-10-29: qty 1

## 2018-10-29 MED ORDER — EPOETIN ALFA 10000 UNIT/ML IJ SOLN
10000.0000 [IU] | INTRAMUSCULAR | Status: DC
  Administered 2018-10-29: 10000 [IU] via SUBCUTANEOUS

## 2018-11-05 ENCOUNTER — Ambulatory Visit (HOSPITAL_COMMUNITY)
Admission: RE | Admit: 2018-11-05 | Discharge: 2018-11-05 | Disposition: A | Discharge status: Home / Self Care | Payer: Medicare Other | Source: Ambulatory Visit | Attending: Nephrology | Admitting: Nephrology

## 2018-11-05 VITALS — BP 143/67 | HR 80 | Temp 98.0°F | Resp 20

## 2018-11-05 DIAGNOSIS — D649 Anemia, unspecified: Secondary | ICD-10-CM | POA: Insufficient documentation

## 2018-11-05 LAB — POCT HEMOGLOBIN-HEMACUE: Hemoglobin: 10.3 g/dL — ABNORMAL LOW (ref 13.0–17.0)

## 2018-11-05 MED ORDER — EPOETIN ALFA 10000 UNIT/ML IJ SOLN
10000.0000 [IU] | INTRAMUSCULAR | Status: DC
  Administered 2018-11-05: 10000 [IU] via SUBCUTANEOUS

## 2018-11-05 MED ORDER — EPOETIN ALFA 10000 UNIT/ML IJ SOLN
INTRAMUSCULAR | Status: AC
  Filled 2018-11-05: qty 1

## 2018-11-09 ENCOUNTER — Other Ambulatory Visit (HOSPITAL_COMMUNITY): Payer: Self-pay | Admitting: *Deleted

## 2018-11-12 ENCOUNTER — Ambulatory Visit (HOSPITAL_COMMUNITY)
Admission: RE | Admit: 2018-11-12 | Discharge: 2018-11-12 | Disposition: A | Discharge status: Home / Self Care | Payer: Medicare Other | Source: Ambulatory Visit | Attending: Nephrology | Admitting: Nephrology

## 2018-11-12 ENCOUNTER — Encounter (HOSPITAL_COMMUNITY): Payer: Medicare Other

## 2018-11-12 VITALS — BP 139/69 | HR 72 | Temp 98.6°F | Resp 20 | Ht 67.0 in | Wt 142.0 lb

## 2018-11-12 DIAGNOSIS — D631 Anemia in chronic kidney disease: Secondary | ICD-10-CM | POA: Insufficient documentation

## 2018-11-12 DIAGNOSIS — D649 Anemia, unspecified: Secondary | ICD-10-CM

## 2018-11-12 DIAGNOSIS — Z79899 Other long term (current) drug therapy: Secondary | ICD-10-CM | POA: Insufficient documentation

## 2018-11-12 DIAGNOSIS — Z5181 Encounter for therapeutic drug level monitoring: Secondary | ICD-10-CM | POA: Diagnosis not present

## 2018-11-12 DIAGNOSIS — N184 Chronic kidney disease, stage 4 (severe): Secondary | ICD-10-CM | POA: Diagnosis present

## 2018-11-12 LAB — POCT HEMOGLOBIN-HEMACUE: HEMOGLOBIN: 10.3 g/dL — AB (ref 13.0–17.0)

## 2018-11-12 MED ORDER — SODIUM CHLORIDE 0.9 % IV SOLN
510.0000 mg | INTRAVENOUS | Status: DC
  Administered 2018-11-12: 510 mg via INTRAVENOUS
  Filled 2018-11-12: qty 17

## 2018-11-12 MED ORDER — EPOETIN ALFA 10000 UNIT/ML IJ SOLN
INTRAMUSCULAR | Status: AC
  Filled 2018-11-12: qty 1

## 2018-11-12 MED ORDER — EPOETIN ALFA 10000 UNIT/ML IJ SOLN
10000.0000 [IU] | INTRAMUSCULAR | Status: DC
  Administered 2018-11-12: 10000 [IU] via SUBCUTANEOUS

## 2018-11-16 ENCOUNTER — Ambulatory Visit: Payer: Medicare Other | Admitting: Family

## 2018-11-16 ENCOUNTER — Telehealth: Payer: Self-pay

## 2018-11-16 ENCOUNTER — Encounter (HOSPITAL_COMMUNITY): Payer: Medicare Other

## 2018-11-16 MED ORDER — SAXAGLIPTIN HCL 2.5 MG PO TABS: 2.5000 mg | ORAL_TABLET | Freq: Every day | ORAL | 1 refills | Status: DC

## 2018-11-16 NOTE — Telephone Encounter (Signed)
Patient is requesting an rx for onglyza 2.5 mg tablets---he used to have prescribed thru [place], but would prefer to have it sent in locally now---[facility]---patient checks sugar daily--well controlled, today's result was 154--he is aware to continue with diet management, too---please send rx in and patient understands to call if sugars are higher than 200 for more advisement---cancelling appointment with laura today, ok to cancel per laura,NP---routing to laura, please advise, thanks

## 2018-11-16 NOTE — Telephone Encounter (Signed)
Spoke with patient and info given 

## 2018-11-16 NOTE — Telephone Encounter (Signed)
Okay refill updated; keep planned follow-up in January unless he needs [place] before then.

## 2018-11-17 ENCOUNTER — Other Ambulatory Visit: Payer: Self-pay | Admitting: Family

## 2018-11-19 ENCOUNTER — Ambulatory Visit (HOSPITAL_COMMUNITY)
Admission: RE | Admit: 2018-11-19 | Discharge: 2018-11-19 | Disposition: A | Discharge status: Home / Self Care | Payer: Medicare Other | Source: Ambulatory Visit | Attending: Nephrology | Admitting: Nephrology

## 2018-11-19 VITALS — BP 142/54 | HR 67 | Temp 98.6°F | Resp 20 | Ht 67.5 in | Wt 141.0 lb

## 2018-11-19 DIAGNOSIS — N184 Chronic kidney disease, stage 4 (severe): Secondary | ICD-10-CM | POA: Insufficient documentation

## 2018-11-19 DIAGNOSIS — Z79899 Other long term (current) drug therapy: Secondary | ICD-10-CM | POA: Insufficient documentation

## 2018-11-19 DIAGNOSIS — D649 Anemia, unspecified: Secondary | ICD-10-CM

## 2018-11-19 DIAGNOSIS — Z5181 Encounter for therapeutic drug level monitoring: Secondary | ICD-10-CM | POA: Diagnosis not present

## 2018-11-19 DIAGNOSIS — D631 Anemia in chronic kidney disease: Secondary | ICD-10-CM | POA: Insufficient documentation

## 2018-11-19 LAB — POCT HEMOGLOBIN-HEMACUE: Hemoglobin: 10.3 g/dL — ABNORMAL LOW (ref 13.0–17.0)

## 2018-11-19 MED ORDER — EPOETIN ALFA 10000 UNIT/ML IJ SOLN
INTRAMUSCULAR | Status: AC
  Administered 2018-11-19: 10000 [IU] via SUBCUTANEOUS
  Filled 2018-11-19: qty 1

## 2018-11-19 MED ORDER — SODIUM CHLORIDE 0.9 % IV SOLN
510.0000 mg | INTRAVENOUS | Status: DC
  Administered 2018-11-19: 510 mg via INTRAVENOUS
  Filled 2018-11-19: qty 17

## 2018-11-19 MED ORDER — EPOETIN ALFA 10000 UNIT/ML IJ SOLN
10000.0000 [IU] | INTRAMUSCULAR | Status: DC
  Administered 2018-11-19: 10000 [IU] via SUBCUTANEOUS

## 2018-11-26 ENCOUNTER — Ambulatory Visit (HOSPITAL_COMMUNITY)
Admission: RE | Admit: 2018-11-26 | Discharge: 2018-11-26 | Disposition: A | Discharge status: Home / Self Care | Payer: Medicare Other | Source: Ambulatory Visit | Attending: Nephrology | Admitting: Nephrology

## 2018-11-26 VITALS — BP 161/64 | HR 72 | Temp 97.7°F | Resp 20 | Ht 67.5 in | Wt 141.0 lb

## 2018-11-26 DIAGNOSIS — D631 Anemia in chronic kidney disease: Secondary | ICD-10-CM | POA: Insufficient documentation

## 2018-11-26 DIAGNOSIS — Z5181 Encounter for therapeutic drug level monitoring: Secondary | ICD-10-CM | POA: Diagnosis not present

## 2018-11-26 DIAGNOSIS — Z79899 Other long term (current) drug therapy: Secondary | ICD-10-CM | POA: Insufficient documentation

## 2018-11-26 DIAGNOSIS — N184 Chronic kidney disease, stage 4 (severe): Secondary | ICD-10-CM | POA: Insufficient documentation

## 2018-11-26 DIAGNOSIS — D649 Anemia, unspecified: Secondary | ICD-10-CM

## 2018-11-26 LAB — IRON AND TIBC
Iron: 55 ug/dL (ref 45–182)
Saturation Ratios: 25 % (ref 17.9–39.5)
TIBC: 224 ug/dL — ABNORMAL LOW (ref 250–450)
UIBC: 169 ug/dL

## 2018-11-26 LAB — FERRITIN: Ferritin: 672 ng/mL — ABNORMAL HIGH (ref 24–336)

## 2018-11-26 LAB — POCT HEMOGLOBIN-HEMACUE: Hemoglobin: 11.6 g/dL — ABNORMAL LOW (ref 13.0–17.0)

## 2018-11-26 MED ORDER — EPOETIN ALFA 10000 UNIT/ML IJ SOLN
INTRAMUSCULAR | Status: AC
  Filled 2018-11-26: qty 1

## 2018-11-26 MED ORDER — EPOETIN ALFA 10000 UNIT/ML IJ SOLN
10000.0000 [IU] | INTRAMUSCULAR | Status: DC
  Administered 2018-11-26: 10000 [IU] via SUBCUTANEOUS

## 2018-12-03 ENCOUNTER — Ambulatory Visit: Payer: Medicare Other | Admitting: Family

## 2018-12-03 ENCOUNTER — Encounter: Payer: Self-pay | Admitting: Family

## 2018-12-03 ENCOUNTER — Other Ambulatory Visit (INDEPENDENT_AMBULATORY_CARE_PROVIDER_SITE_OTHER): Payer: Medicare Other

## 2018-12-03 ENCOUNTER — Ambulatory Visit (HOSPITAL_COMMUNITY)
Admission: RE | Admit: 2018-12-03 | Discharge: 2018-12-03 | Disposition: A | Discharge status: Home / Self Care | Payer: Medicare Other | Source: Ambulatory Visit | Attending: Nephrology | Admitting: Nephrology

## 2018-12-03 VITALS — BP 138/68 | HR 63 | Temp 97.7°F | Ht 67.5 in | Wt 144.1 lb

## 2018-12-03 VITALS — BP 146/67 | HR 66 | Temp 97.8°F | Resp 20

## 2018-12-03 DIAGNOSIS — E1122 Type 2 diabetes mellitus with diabetic chronic kidney disease: Secondary | ICD-10-CM

## 2018-12-03 DIAGNOSIS — D649 Anemia, unspecified: Secondary | ICD-10-CM | POA: Diagnosis not present

## 2018-12-03 DIAGNOSIS — N184 Chronic kidney disease, stage 4 (severe): Secondary | ICD-10-CM

## 2018-12-03 DIAGNOSIS — C221 Intrahepatic bile duct carcinoma: Secondary | ICD-10-CM

## 2018-12-03 LAB — LIPASE: Lipase: 57 U/L (ref 11.0–59.0)

## 2018-12-03 LAB — COMPREHENSIVE METABOLIC PANEL
ALBUMIN: 3.9 g/dL (ref 3.5–5.2)
ALT: 12 U/L (ref 0–53)
AST: 14 U/L (ref 0–37)
Alkaline Phosphatase: 85 U/L (ref 39–117)
BILIRUBIN TOTAL: 0.3 mg/dL (ref 0.2–1.2)
BUN: 34 mg/dL — ABNORMAL HIGH (ref 6–23)
CO2: 25 mEq/L (ref 19–32)
Calcium: 9.3 mg/dL (ref 8.4–10.5)
Chloride: 103 mEq/L (ref 96–112)
Creatinine, Ser: 2.44 mg/dL — ABNORMAL HIGH (ref 0.40–1.50)
GFR: 26.59 mL/min — ABNORMAL LOW (ref >60.00)
Glucose, Bld: 136 mg/dL — ABNORMAL HIGH (ref 70–99)
Potassium: 4.9 mEq/L (ref 3.5–5.1)
Sodium: 137 mEq/L (ref 135–145)
TOTAL PROTEIN: 7.8 g/dL (ref 6.0–8.3)

## 2018-12-03 LAB — HEMOGLOBIN A1C: Hgb A1c MFr Bld: 5.8 % (ref 4.6–6.5)

## 2018-12-03 LAB — POCT HEMOGLOBIN-HEMACUE: HEMOGLOBIN: 12 g/dL — AB (ref 13.0–17.0)

## 2018-12-03 LAB — AMYLASE: Amylase: 50 U/L (ref 27–131)

## 2018-12-03 MED ORDER — EPOETIN ALFA 10000 UNIT/ML IJ SOLN: 10000.0000 [IU] | INTRAMUSCULAR | Status: DC

## 2018-12-03 NOTE — Progress Notes (Signed)
Gerald Jacobs is a 85 y.o. male with the following history as recorded in EpicCare:  Patient Active Problem List   Diagnosis Date Noted  . Intrahepatic cholangiocarcinoma ([place]) 06/27/2017  . Liver mass   . Hyperbilirubinemia 06/15/2017  . Essential hypertension 09/02/2016  . Statin myopathy 07/28/2016  . Normocytic anemia 06/23/2016  . Medicare annual wellness visit, subsequent 01/18/2016  . Routine general medical examination at a health care facility 01/18/2016  . Frequent PVCs 11/24/2014  . Intermittent palpitations 07/23/2014  . Chronic cholecystitis with calculus 02/04/2014  . Choledocholithiasis 08/28/2013  . Symptomatic cholelithiasis 06/04/2013  . Elevated LFTs 06/04/2013  . Benign hypertensive heart disease without heart failure 09/07/2011  . Type 2 diabetes mellitus with stage 4 chronic kidney disease, without long-term current use of insulin ([place]) 09/07/2011  . Dyslipidemia 09/07/2011  . CKD (chronic kidney disease), stage IV ([place]) 09/07/2011    Current Outpatient Medications  Medication Sig Dispense Refill  . allopurinol (ZYLOPRIM) 100 MG tablet Take 1 tablet (100 mg total) by mouth daily. 90 tablet 0  . amLODipine (NORVASC) 5 MG tablet TAKE 1 TABLET BY MOUTH EVERY DAY 90 tablet 1  . aspirin 81 MG tablet Take 81 mg by mouth daily.      . Biotin 1000 MCG tablet Take 1,000 mcg by mouth daily.    . cholecalciferol (VITAMIN D) 1000 units tablet Take 2,000 Units by mouth daily.     . diphenhydramine-acetaminophen (TYLENOL PM) 25-500 MG TABS tablet Take 1 tablet by mouth at bedtime as needed (pain).    . feeding supplement, GLUCERNA SHAKE, (GLUCERNA SHAKE) LIQD Take 237 mLs by mouth 2 (two) times daily between meals. (Patient not taking: Reported on 10/03/2018) 237 mL 0  . furosemide (LASIX) 20 MG tablet Take 20 mg by mouth daily.  0  . glucose blood (ONE TOUCH ULTRA TEST) test strip USE ONE STRIP PER TEST. CHECK BLOOD SUGARS 1-4 TIMES PER DAY AS INSTRUCTED. 100 each 4  .  losartan (COZAAR) 50 MG tablet Take 50 mg by mouth daily.  1  . metoprolol succinate (TOPROL-XL) 50 MG 24 hr tablet Take 1 tablet (50 mg total) by mouth every morning. 90 tablet 1  . neomycin-polymyxin-hydrocortisone (CORTISPORIN) OTIC solution Place 3 drops into the left ear 3 (three) times daily. 10 mL 0  . ondansetron (ZOFRAN) 8 MG tablet Take by mouth every 8 (eight) hours as needed for nausea or vomiting.    Glory Rosebush DELICA LANCETS 32R MISC TEST BLOOD SUGARS 1-4 TIMES A DAY AS INSTRUCTED. DX CODE: E11.9 100 each 2  . pantoprazole (PROTONIX) 40 MG tablet Take 40 mg by mouth daily as needed (indigestion).     . promethazine (PHENERGAN) 12.5 MG tablet Take 12.5 mg by mouth every 6 (six) hours as needed for nausea or vomiting.    . protein supplement shake (PREMIER PROTEIN) LIQD Take 2 oz by mouth daily.    . sitaGLIPtin (JANUVIA) 50 MG tablet Take 1 tablet (50 mg total) by mouth daily. 90 tablet 1  . traZODone (DESYREL) 50 MG tablet Take 0.5-1 tablets (25-50 mg total) by mouth at bedtime as needed for sleep. 30 tablet 3  . triamcinolone (KENALOG) 0.025 % cream Apply 1 application topically 2 (two) times daily as needed (itching).     . triamcinolone (KENALOG) 0.1 % paste Use as directed 1 application in the mouth or throat 2 (two) times daily.      No current facility-administered medications for this visit.    Facility-Administered  Medications Ordered in Other Visits  Medication Dose Route Frequency Provider Last Rate Last Dose  . epoetin alfa (EPOGEN,PROCRIT) injection 10,000 Units  10,000 Units Subcutaneous Weekly Elmarie Shiley, MD        Allergies: Augmentin [amoxicillin-pot clavulanate]; Colchicine; Flexeril [cyclobenzaprine hcl]; Levaquin [levofloxacin in d5w]; Percocet [oxycodone-acetaminophen]; Zebeta; and Bisoprolol fumarate  Past Medical History:  Diagnosis Date  . Bilateral shoulder pain 07/28/2016  . Cancer [facility])    prostate  . Chest pain 10/27/2016  . Chronic anemia    With  normal iron studies and normal serum protein electrophoresis  . Coronary artery disease    Mild  . Cough 10/27/2016  . Diabetes mellitus    Adult onset  . Essential hypertension   . GERD (gastroesophageal reflux disease)    occ tums  . History of gout   . Hyperbilirubinemia 06/15/2017  . Hyponatremia 06/15/2017  . Intermittent palpitations 07/23/2014  . Liver mass   . Mass of bile duct 06/27/2017  . Mild renal insufficiency   . Nocturia    x2  . Normocytic anemia 06/23/2016  . Osteoarthritis   . Sciatica of right side 07/28/2016  . Statin myopathy 07/28/2016  . Weight loss 06/15/2017    Past Surgical History:  Procedure Laterality Date  . CHOLECYSTECTOMY  02/04/2014   DR Zella Richer  . CHOLECYSTECTOMY N/A 02/04/2014   Procedure: LAPAROSCOPIC CHOLECYSTECTOMY with intraoperative cholangiogram;  Surgeon: Odis Hollingshead, MD;  Location: [place];  Service: General;  Laterality: N/A;  . ERCP N/A 10/15/2013   Procedure: ENDOSCOPIC RETROGRADE CHOLANGIOPANCREATOGRAPHY (ERCP);  Surgeon: Jeryl Columbia, MD;  Location[facility];  Service: Endoscopy;  Laterality: N/A;  . KNEE SURGERY Right 1979  . melanoma surgery  1970's   on scalp  . PROSTATE SURGERY  1993   Prostate Cancer  . SPHINCTEROTOMY  10/15/2013   Procedure: SPHINCTEROTOMY;  Surgeon: Jeryl Columbia, MD;  Location: [facility];  Service: Endoscopy;;    Family History  Problem Relation Age of Onset  . Stroke Father   . Stroke Mother   . Stroke Sister   . Stroke Brother   . Stroke Brother   . Stroke Brother     Social History   Tobacco Use  . Smoking status: Former Smoker    Packs/day: 1.00    Years: 45.00    Pack years: 45.00    Types: Cigarettes    Last attempt to quit: 09/07/1979    Years since quitting: 39.2  . Smokeless tobacco: Never Used  Substance Use Topics  . Alcohol use: Yes    Comment: beer rarely    Subjective:  Patient presents for follow-up on chronic care needs; concerned about cost of Januvia-  planning to talk to his nephrologist about cost of medication/ alternative options; does feel like blood sugar has been well controlled- averaging 134;  Requesting to have labs updated to check kidney functions/ liver functions today;   Objective:  Vitals:   12/03/18 1005  BP: 138/68  Pulse: 63  Temp: 97.7 F (36.5 C)  TempSrc: Oral  SpO2: 99%  Weight: 144 lb 1.3 oz (65.4 kg)  Height: 5' 7.5" (1.715 m)    General: Well developed, well nourished, in no acute distress  Skin : Warm and dry.  Head: Normocephalic and atraumatic  Lungs: Respirations unlabored;  Neurologic: Alert and oriented; speech intact; face symmetrical; moves all extremities well; CNII-XII intact without focal deficit   Assessment:  1. Intrahepatic cholangiocarcinoma ([place])   2. Type 2 diabetes mellitus  with stage 4 chronic kidney disease, without long-term current use of insulin ([place])     Plan:  Update labs as discussed; patient appears to be doing very well; Samples of Januvia 100 mg given to patient- he will cut in 1/2 and use daily until he can talk to his nephrologist about alternative medications.   No follow-ups on file.  Orders Placed This Encounter  Procedures  . Comp Met (CMET)    Standing Status:   Future    Number of Occurrences:   1    Standing Expiration Date:   12/03/2019  . HgB A1c    Standing Status:   Future    Number of Occurrences:   1    Standing Expiration Date:   12/03/2019  . Amylase    Standing Status:   Future    Number of Occurrences:   1    Standing Expiration Date:   12/03/2019  . Lipase    Standing Status:   Future    Number of Occurrences:   1    Standing Expiration Date:   12/03/2019    Requested Prescriptions    No prescriptions requested or ordered in this encounter

## 2018-12-10 ENCOUNTER — Encounter (HOSPITAL_COMMUNITY): Payer: Medicare Other

## 2018-12-17 ENCOUNTER — Ambulatory Visit (HOSPITAL_COMMUNITY)
Admission: RE | Admit: 2018-12-17 | Discharge: 2018-12-17 | Disposition: A | Discharge status: Home / Self Care | Payer: Medicare Other | Source: Ambulatory Visit | Attending: Nephrology | Admitting: Nephrology

## 2018-12-17 VITALS — BP 148/53 | HR 69 | Temp 97.9°F | Resp 20

## 2018-12-17 DIAGNOSIS — D649 Anemia, unspecified: Secondary | ICD-10-CM | POA: Insufficient documentation

## 2018-12-17 LAB — POCT HEMOGLOBIN-HEMACUE: Hemoglobin: 11.8 g/dL — ABNORMAL LOW (ref 13.0–17.0)

## 2018-12-17 MED ORDER — EPOETIN ALFA 10000 UNIT/ML IJ SOLN
10000.0000 [IU] | INTRAMUSCULAR | Status: DC
  Administered 2018-12-17: 10000 [IU] via SUBCUTANEOUS

## 2018-12-17 MED ORDER — EPOETIN ALFA 10000 UNIT/ML IJ SOLN
INTRAMUSCULAR | Status: AC
  Filled 2018-12-17: qty 1

## 2018-12-22 ENCOUNTER — Other Ambulatory Visit: Payer: Self-pay | Admitting: Nurse Practitioner

## 2018-12-24 ENCOUNTER — Ambulatory Visit (HOSPITAL_COMMUNITY)
Admission: RE | Admit: 2018-12-24 | Discharge: 2018-12-24 | Disposition: A | Discharge status: Home / Self Care | Payer: Medicare Other | Source: Ambulatory Visit | Attending: Nephrology | Admitting: Nephrology

## 2018-12-24 VITALS — BP 169/59 | HR 71 | Temp 97.8°F | Resp 20

## 2018-12-24 DIAGNOSIS — N184 Chronic kidney disease, stage 4 (severe): Secondary | ICD-10-CM | POA: Insufficient documentation

## 2018-12-24 DIAGNOSIS — Z5181 Encounter for therapeutic drug level monitoring: Secondary | ICD-10-CM | POA: Insufficient documentation

## 2018-12-24 DIAGNOSIS — D631 Anemia in chronic kidney disease: Secondary | ICD-10-CM | POA: Diagnosis not present

## 2018-12-24 DIAGNOSIS — D649 Anemia, unspecified: Secondary | ICD-10-CM

## 2018-12-24 DIAGNOSIS — Z79899 Other long term (current) drug therapy: Secondary | ICD-10-CM | POA: Diagnosis not present

## 2018-12-24 LAB — FERRITIN: Ferritin: 474 ng/mL — ABNORMAL HIGH (ref 24–336)

## 2018-12-24 LAB — IRON AND TIBC
Iron: 65 ug/dL (ref 45–182)
Saturation Ratios: 25 % (ref 17.9–39.5)
TIBC: 260 ug/dL (ref 250–450)
UIBC: 195 ug/dL

## 2018-12-24 LAB — POCT HEMOGLOBIN-HEMACUE: Hemoglobin: 11.7 g/dL — ABNORMAL LOW (ref 13.0–17.0)

## 2018-12-24 MED ORDER — EPOETIN ALFA 10000 UNIT/ML IJ SOLN
INTRAMUSCULAR | Status: AC
  Administered 2018-12-24: 10000 [IU] via SUBCUTANEOUS
  Filled 2018-12-24: qty 1

## 2018-12-24 MED ORDER — EPOETIN ALFA 10000 UNIT/ML IJ SOLN
10000.0000 [IU] | INTRAMUSCULAR | Status: DC
  Administered 2018-12-24: 10000 [IU] via SUBCUTANEOUS

## 2018-12-25 ENCOUNTER — Encounter: Payer: Self-pay | Admitting: Family

## 2018-12-25 ENCOUNTER — Other Ambulatory Visit (INDEPENDENT_AMBULATORY_CARE_PROVIDER_SITE_OTHER): Payer: Medicare Other

## 2018-12-25 ENCOUNTER — Ambulatory Visit: Payer: Medicare Other | Admitting: Family

## 2018-12-25 VITALS — BP 130/60 | HR 75 | Temp 98.3°F | Ht 67.5 in | Wt 142.1 lb

## 2018-12-25 DIAGNOSIS — R109 Unspecified abdominal pain: Secondary | ICD-10-CM | POA: Diagnosis not present

## 2018-12-25 DIAGNOSIS — C221 Intrahepatic bile duct carcinoma: Secondary | ICD-10-CM | POA: Diagnosis not present

## 2018-12-25 DIAGNOSIS — R110 Nausea: Secondary | ICD-10-CM

## 2018-12-25 LAB — COMPREHENSIVE METABOLIC PANEL
ALT: 210 U/L — ABNORMAL HIGH (ref 0–53)
AST: 158 U/L — ABNORMAL HIGH (ref 0–37)
Albumin: 3.6 g/dL (ref 3.5–5.2)
Alkaline Phosphatase: 171 U/L — ABNORMAL HIGH (ref 39–117)
BUN: 45 mg/dL — ABNORMAL HIGH (ref 6–23)
CO2: 25 meq/L (ref 19–32)
Calcium: 8.8 mg/dL (ref 8.4–10.5)
Chloride: 99 mEq/L (ref 96–112)
Creatinine, Ser: 2.75 mg/dL — ABNORMAL HIGH (ref 0.40–1.50)
GFR: 21.79 mL/min — ABNORMAL LOW (ref >60.00)
Glucose, Bld: 228 mg/dL — ABNORMAL HIGH (ref 70–99)
POTASSIUM: 4.3 meq/L (ref 3.5–5.1)
Sodium: 132 mEq/L — ABNORMAL LOW (ref 135–145)
Total Bilirubin: 3 mg/dL — ABNORMAL HIGH (ref 0.2–1.2)
Total Protein: 7.2 g/dL (ref 6.0–8.3)

## 2018-12-25 LAB — CBC WITH DIFFERENTIAL/PLATELET
Basophils Absolute: 0 10*3/uL (ref 0.0–0.1)
Basophils Relative: 0.3 % (ref 0.0–3.0)
Eosinophils Absolute: 0 10*3/uL (ref 0.0–0.7)
Eosinophils Relative: 0.3 % (ref 0.0–5.0)
HCT: 34.6 % — ABNORMAL LOW (ref 39.0–52.0)
Hemoglobin: 11.3 g/dL — ABNORMAL LOW (ref 13.0–17.0)
LYMPHS ABS: 0.8 10*3/uL (ref 0.7–4.0)
Lymphocytes Relative: 5.4 % — ABNORMAL LOW (ref 12.0–46.0)
MCHC: 32.7 g/dL (ref 30.0–36.0)
MCV: 91.6 fl (ref 78.0–100.0)
MONOS PCT: 9.7 % (ref 3.0–12.0)
Monocytes Absolute: 1.4 10*3/uL — ABNORMAL HIGH (ref 0.1–1.0)
Neutro Abs: 11.8 10*3/uL — ABNORMAL HIGH (ref 1.4–7.7)
Neutrophils Relative %: 84.3 % — ABNORMAL HIGH (ref 43.0–77.0)
Platelets: 149 10*3/uL — ABNORMAL LOW (ref 150.0–400.0)
RBC: 3.77 Mil/uL — ABNORMAL LOW (ref 4.22–5.81)
RDW: 18.4 % — ABNORMAL HIGH (ref 11.5–15.5)
WBC: 14 10*3/uL — ABNORMAL HIGH (ref 4.0–10.5)

## 2018-12-25 MED ORDER — OSELTAMIVIR PHOSPHATE 75 MG PO CAPS: 75.0000 mg | ORAL_CAPSULE | Freq: Every day | ORAL | 0 refills | Status: DC

## 2018-12-25 NOTE — Progress Notes (Signed)
I reviewed these labs with PA at [place] where patient's care is being managed; he needs ERCP and this cannot be done at [facility]; [place], [place] at [facility] was going to review labs/ determine if outpatient procedure was an option for tomorrow to try and keep patient out of ER today; Margot indicated that she would call the patient/ family's daughter to discuss treatment plan;   I notified daughter of this plan and she is in agreement; strict ER precautions discussed especially if patient starts running a fever, cannot keep any fluids down; they will call if our office can help with anything in the interim.

## 2018-12-25 NOTE — Progress Notes (Signed)
Gerald Jacobs is a 85 y.o. male with the following history as recorded in EpicCare:  Patient Active Problem List   Diagnosis Date Noted  . Intrahepatic cholangiocarcinoma ([place]) 06/27/2017  . Liver mass   . Hyperbilirubinemia 06/15/2017  . Essential hypertension 09/02/2016  . Statin myopathy 07/28/2016  . Normocytic anemia 06/23/2016  . Medicare annual wellness visit, subsequent 01/18/2016  . Routine general medical examination at a health care facility 01/18/2016  . Frequent PVCs 11/24/2014  . Intermittent palpitations 07/23/2014  . Chronic cholecystitis with calculus 02/04/2014  . Choledocholithiasis 08/28/2013  . Symptomatic cholelithiasis 06/04/2013  . Elevated LFTs 06/04/2013  . Benign hypertensive heart disease without heart failure 09/07/2011  . Type 2 diabetes mellitus with stage 4 chronic kidney disease, without long-term current use of insulin ([place]) 09/07/2011  . Dyslipidemia 09/07/2011  . CKD (chronic kidney disease), stage IV ([place]) 09/07/2011    Current Outpatient Medications  Medication Sig Dispense Refill  . allopurinol (ZYLOPRIM) 100 MG tablet TAKE 1 TABLET BY MOUTH EVERY DAY 90 tablet 0  . amLODipine (NORVASC) 5 MG tablet TAKE 1 TABLET BY MOUTH EVERY DAY 90 tablet 1  . aspirin 81 MG tablet Take 81 mg by mouth daily.      . Biotin 1000 MCG tablet Take 1,000 mcg by mouth daily.    . cholecalciferol (VITAMIN D) 1000 units tablet Take 2,000 Units by mouth daily.     . diphenhydramine-acetaminophen (TYLENOL PM) 25-500 MG TABS tablet Take 1 tablet by mouth at bedtime as needed (pain).    . feeding supplement, GLUCERNA SHAKE, (GLUCERNA SHAKE) LIQD Take 237 mLs by mouth 2 (two) times daily between meals. 237 mL 0  . furosemide (LASIX) 20 MG tablet Take 20 mg by mouth daily.  0  . glucose blood (ONE TOUCH ULTRA TEST) test strip USE ONE STRIP PER TEST. CHECK BLOOD SUGARS 1-4 TIMES PER DAY AS INSTRUCTED. 100 each 4  . losartan (COZAAR) 50 MG tablet Take 50 mg by mouth daily.   1  . metoprolol succinate (TOPROL-XL) 50 MG 24 hr tablet Take 1 tablet (50 mg total) by mouth every morning. 90 tablet 1  . neomycin-polymyxin-hydrocortisone (CORTISPORIN) OTIC solution Place 3 drops into the left ear 3 (three) times daily. 10 mL 0  . ondansetron (ZOFRAN) 8 MG tablet Take by mouth every 8 (eight) hours as needed for nausea or vomiting.    Glory Rosebush DELICA LANCETS 98P MISC TEST BLOOD SUGARS 1-4 TIMES A DAY AS INSTRUCTED. DX CODE: E11.9 100 each 2  . pantoprazole (PROTONIX) 40 MG tablet Take 40 mg by mouth daily as needed (indigestion).     . promethazine (PHENERGAN) 12.5 MG tablet Take 12.5 mg by mouth every 6 (six) hours as needed for nausea or vomiting.    . protein supplement shake (PREMIER PROTEIN) LIQD Take 2 oz by mouth daily.    . sitaGLIPtin (JANUVIA) 50 MG tablet Take 1 tablet (50 mg total) by mouth daily. 90 tablet 1  . traZODone (DESYREL) 50 MG tablet Take 0.5-1 tablets (25-50 mg total) by mouth at bedtime as needed for sleep. 30 tablet 3  . triamcinolone (KENALOG) 0.025 % cream Apply 1 application topically 2 (two) times daily as needed (itching).     . triamcinolone (KENALOG) 0.1 % paste Use as directed 1 application in the mouth or throat 2 (two) times daily.      No current facility-administered medications for this visit.     Allergies: Augmentin [amoxicillin-pot clavulanate]; Colchicine; Flexeril [cyclobenzaprine hcl];  Levaquin [levofloxacin in d5w]; Percocet [oxycodone-acetaminophen]; Zebeta; and Bisoprolol fumarate  Past Medical History:  Diagnosis Date  . Bilateral shoulder pain 07/28/2016  . Cancer [facility])    prostate  . Chest pain 10/27/2016  . Chronic anemia    With normal iron studies and normal serum protein electrophoresis  . Coronary artery disease    Mild  . Cough 10/27/2016  . Diabetes mellitus    Adult onset  . Essential hypertension   . GERD (gastroesophageal reflux disease)    occ tums  . History of gout   . Hyperbilirubinemia 06/15/2017   . Hyponatremia 06/15/2017  . Intermittent palpitations 07/23/2014  . Liver mass   . Mass of bile duct 06/27/2017  . Mild renal insufficiency   . Nocturia    x2  . Normocytic anemia 06/23/2016  . Osteoarthritis   . Sciatica of right side 07/28/2016  . Statin myopathy 07/28/2016  . Weight loss 06/15/2017    Past Surgical History:  Procedure Laterality Date  . CHOLECYSTECTOMY  02/04/2014   DR Zella Richer  . CHOLECYSTECTOMY N/A 02/04/2014   Procedure: LAPAROSCOPIC CHOLECYSTECTOMY with intraoperative cholangiogram;  Surgeon: Odis Hollingshead, MD;  Location: [place];  Service: General;  Laterality: N/A;  . ERCP N/A 10/15/2013   Procedure: ENDOSCOPIC RETROGRADE CHOLANGIOPANCREATOGRAPHY (ERCP);  Surgeon: Jeryl Columbia, MD;  [facility];  Service: Endoscopy;  Laterality: N/A;  . KNEE SURGERY Right 1979  . melanoma surgery  1970's   on scalp  . PROSTATE SURGERY  1993   Prostate Cancer  . SPHINCTEROTOMY  10/15/2013   Procedure: SPHINCTEROTOMY;  Surgeon: Jeryl Columbia, MD;  [facility];  Service: Endoscopy;;    Family History  Problem Relation Age of Onset  . Stroke Father   . Stroke Mother   . Stroke Sister   . Stroke Brother   . Stroke Brother   . Stroke Brother     Social History   Tobacco Use  . Smoking status: Former Smoker    Packs/day: 1.00    Years: 45.00    Pack years: 45.00    Types: Cigarettes    Last attempt to quit: 09/07/1979    Years since quitting: 39.3  . Smokeless tobacco: Never Used  Substance Use Topics  . Alcohol use: Yes    Comment: beer rarely    Subjective:  Patient went out to lunch yesterday- ate a hamburger and notes it "just didn't agree with him." [place] home and took a nap/ woke up feeling "very cold." Later in the day, took a Zofran/ able to vomit with mild relief; notes that had some abdominal pain last night- feels like pulled a muscle; able to able eat some crackers/ cereal/ doughnuts this morning;    Of note, daughter tested  positive for the flu; patient was with his daughter on Saturday;     Objective:  Vitals:   12/25/18 1204  BP: 130/60  Pulse: 75  Temp: 98.3 F (36.8 C)  TempSrc: Oral  SpO2: 98%  Weight: 142 lb 1.3 oz (64.4 kg)  Height: 5' 7.5" (1.715 m)    General: Well developed, well nourished, in no acute distress  Skin : Warm and dry.  Head: Normocephalic and atraumatic  Eyes: Sclera and conjunctiva clear; pupils round and reactive to light; extraocular movements intact  Ears: External normal; canals clear; tympanic membranes normal  Oropharynx: Pink, supple. No suspicious lesions  Neck: Supple without thyromegaly, adenopathy  Lungs: Respirations unlabored; clear to auscultation bilaterally without wheeze, rales,  rhonchi  CVS exam: normal rate and regular rhythm.  Abdomen: Soft; nontender; nondistended; normoactive bowel sounds; no masses or hepatosplenomegaly  Neurologic: Alert and oriented; speech intact; face symmetrical; moves all extremities well; CNII-XII intact without focal deficit   Assessment:  1. Abdominal pain, unspecified abdominal location   2. Nausea     Plan:  Due to history of  Intrahepatic cholangiocarcinoma, will check STAT CBC, CMP today; question possible obstruction vs food poisoning; follow-up to be determined;  Rx for Tamiflu 75 mg qd x 10 days sent as well due to recent exposure to flu.  No follow-ups on file.  Orders Placed This Encounter  Procedures  . CBC w/Diff    Standing Status:   Future    Standing Expiration Date:   12/25/2019  . Comp Met (CMET)    Standing Status:   Future    Standing Expiration Date:   12/25/2019    Requested Prescriptions    No prescriptions requested or ordered in this encounter

## 2018-12-28 MED ORDER — DEXTROSE 50 % IV SOLN
12.50 | INTRAVENOUS | Status: DC
Start: ? — End: 2018-12-28

## 2018-12-28 MED ORDER — INSULIN LISPRO 100 UNIT/ML ~~LOC~~ SOLN
0.00 | SUBCUTANEOUS | Status: DC
Start: 2019-01-01 — End: 2018-12-28

## 2018-12-28 MED ORDER — GENERIC EXTERNAL MEDICATION
3.38 | Status: DC
Start: 2019-01-01 — End: 2018-12-28

## 2018-12-28 MED ORDER — ALLOPURINOL 100 MG PO TABS
100.00 | ORAL_TABLET | ORAL | Status: DC
Start: 2019-01-02 — End: 2018-12-28

## 2018-12-28 MED ORDER — GENERIC EXTERNAL MEDICATION
12.50 | Status: DC
Start: 2018-12-28 — End: 2018-12-28

## 2018-12-28 MED ORDER — OSELTAMIVIR PHOSPHATE 6 MG/ML PO SUSR
30.00 | ORAL | Status: DC
Start: 2018-12-31 — End: 2018-12-28

## 2018-12-28 MED ORDER — PANTOPRAZOLE SODIUM 40 MG PO TBEC
40.00 | DELAYED_RELEASE_TABLET | ORAL | Status: DC
Start: 2019-01-02 — End: 2018-12-28

## 2018-12-28 MED ORDER — LIDOCAINE HCL (PF) 1 % IJ SOLN
0.50 | INTRAMUSCULAR | Status: DC
Start: ? — End: 2018-12-28

## 2018-12-28 MED ORDER — GLUCAGON HCL RDNA (DIAGNOSTIC) 1 MG IJ SOLR
1.00 | INTRAMUSCULAR | Status: DC
Start: ? — End: 2018-12-28

## 2018-12-31 ENCOUNTER — Encounter (HOSPITAL_COMMUNITY): Payer: Medicare Other

## 2018-12-31 MED ORDER — METOPROLOL TARTRATE 25 MG PO TABS
25.00 | ORAL_TABLET | ORAL | Status: DC
Start: 2019-01-01 — End: 2018-12-31

## 2018-12-31 MED ORDER — AMLODIPINE BESYLATE 5 MG PO TABS
2.50 | ORAL_TABLET | ORAL | Status: DC
Start: 2019-01-02 — End: 2018-12-31

## 2019-01-02 MED ORDER — DEXTROSE IN LACTATED RINGERS 5 % IV SOLN
INTRAVENOUS | Status: DC
Start: ? — End: 2019-01-02

## 2019-01-06 ENCOUNTER — Encounter: Payer: Self-pay | Admitting: Family

## 2019-01-07 ENCOUNTER — Ambulatory Visit: Payer: Medicare Other | Admitting: Family

## 2019-01-07 ENCOUNTER — Ambulatory Visit (HOSPITAL_COMMUNITY)
Admission: RE | Admit: 2019-01-07 | Discharge: 2019-01-07 | Disposition: A | Discharge status: Home / Self Care | Payer: Medicare Other | Source: Ambulatory Visit | Attending: Nephrology | Admitting: Nephrology

## 2019-01-07 ENCOUNTER — Encounter: Payer: Self-pay | Admitting: Family

## 2019-01-07 ENCOUNTER — Other Ambulatory Visit: Payer: Self-pay | Admitting: Family

## 2019-01-07 ENCOUNTER — Encounter (HOSPITAL_COMMUNITY): Payer: Medicare Other

## 2019-01-07 VITALS — BP 124/78 | HR 69 | Temp 97.9°F | Ht 67.5 in | Wt 139.0 lb

## 2019-01-07 VITALS — BP 165/66 | HR 67 | Temp 98.5°F | Resp 20

## 2019-01-07 DIAGNOSIS — C221 Intrahepatic bile duct carcinoma: Secondary | ICD-10-CM

## 2019-01-07 DIAGNOSIS — I4891 Unspecified atrial fibrillation: Secondary | ICD-10-CM | POA: Diagnosis not present

## 2019-01-07 DIAGNOSIS — D649 Anemia, unspecified: Secondary | ICD-10-CM | POA: Insufficient documentation

## 2019-01-07 MED ORDER — EPOETIN ALFA 10000 UNIT/ML IJ SOLN: 10000.0000 [IU] | INTRAMUSCULAR | Status: DC

## 2019-01-07 MED ORDER — EPOETIN ALFA 10000 UNIT/ML IJ SOLN
INTRAMUSCULAR | Status: AC
  Administered 2019-01-07: 10000 [IU]
  Filled 2019-01-07: qty 1

## 2019-01-07 NOTE — Progress Notes (Signed)
Gerald Jacobs is a 85 y.o. male with the following history as recorded in EpicCare:  Patient Active Problem List   Diagnosis Date Noted  . Intrahepatic cholangiocarcinoma ([place]) 06/27/2017  . Liver mass   . Hyperbilirubinemia 06/15/2017  . Essential hypertension 09/02/2016  . Statin myopathy 07/28/2016  . Normocytic anemia 06/23/2016  . Medicare annual wellness visit, subsequent 01/18/2016  . Routine general medical examination at a health care facility 01/18/2016  . Frequent PVCs 11/24/2014  . Intermittent palpitations 07/23/2014  . Chronic cholecystitis with calculus 02/04/2014  . Choledocholithiasis 08/28/2013  . Symptomatic cholelithiasis 06/04/2013  . Elevated LFTs 06/04/2013  . Benign hypertensive heart disease without heart failure 09/07/2011  . Type 2 diabetes mellitus with stage 4 chronic kidney disease, without long-term current use of insulin ([place]) 09/07/2011  . Dyslipidemia 09/07/2011  . CKD (chronic kidney disease), stage IV ([place]) 09/07/2011    Current Outpatient Medications  Medication Sig Dispense Refill  . allopurinol (ZYLOPRIM) 100 MG tablet TAKE 1 TABLET BY MOUTH EVERY DAY 90 tablet 0  . amLODipine (NORVASC) 5 MG tablet TAKE 1 TABLET BY MOUTH EVERY DAY 90 tablet 1  . aspirin 81 MG tablet Take 81 mg by mouth daily.      . Biotin 1000 MCG tablet Take 1,000 mcg by mouth daily.    . cholecalciferol (VITAMIN D) 1000 units tablet Take 2,000 Units by mouth daily.     Marland Kitchen Dextran 70-Hypromellose (ARTIFICIAL TEARS) 0.1-0.3 % SOLN Apply to eye.    . diphenhydramine-acetaminophen (TYLENOL PM) 25-500 MG TABS tablet Take 1 tablet by mouth at bedtime as needed (pain).    . feeding supplement, GLUCERNA SHAKE, (GLUCERNA SHAKE) LIQD Take 237 mLs by mouth 2 (two) times daily between meals. 237 mL 0  . furosemide (LASIX) 20 MG tablet Take 20 mg by mouth daily.  0  . glucose blood (ONE TOUCH ULTRA TEST) test strip USE ONE STRIP PER TEST. CHECK BLOOD SUGARS 1-4 TIMES PER DAY AS  INSTRUCTED. 100 each 4  . metoprolol succinate (TOPROL-XL) 50 MG 24 hr tablet Take 1 tablet (50 mg total) by mouth every morning. 90 tablet 1  . neomycin-polymyxin-hydrocortisone (CORTISPORIN) OTIC solution Place 3 drops into the left ear 3 (three) times daily. 10 mL 0  . nystatin (MYCOSTATIN) 100000 UNIT/ML suspension     . ondansetron (ZOFRAN) 8 MG tablet Take by mouth every 8 (eight) hours as needed for nausea or vomiting.    Glory Rosebush DELICA LANCETS 56D MISC TEST BLOOD SUGARS 1-4 TIMES A DAY AS INSTRUCTED. DX CODE: E11.9 100 each 2  . oseltamivir (TAMIFLU) 75 MG capsule Take 1 capsule (75 mg total) by mouth daily. 10 capsule 0  . pantoprazole (PROTONIX) 40 MG tablet Take 40 mg by mouth daily as needed (indigestion).     . promethazine (PHENERGAN) 12.5 MG tablet Take 12.5 mg by mouth every 6 (six) hours as needed for nausea or vomiting.    . protein supplement shake (PREMIER PROTEIN) LIQD Take 2 oz by mouth daily.    . sitaGLIPtin (JANUVIA) 50 MG tablet Take 1 tablet (50 mg total) by mouth daily. 90 tablet 1  . sodium chloride (SALINE MIST) 0.65 % nasal spray Place into the nose.    . triamcinolone (KENALOG) 0.025 % cream Apply 1 application topically 2 (two) times daily as needed (itching).     . triamcinolone (KENALOG) 0.1 % paste Use as directed 1 application in the mouth or throat 2 (two) times daily.     Marland Kitchen  losartan (COZAAR) 50 MG tablet Take 50 mg by mouth daily.  1  . traZODone (DESYREL) 50 MG tablet Take 0.5-1 tablets (25-50 mg total) by mouth at bedtime as needed for sleep. (Patient not taking: Reported on 01/07/2019) 30 tablet 3   No current facility-administered medications for this visit.    Facility-Administered Medications Ordered in Other Visits  Medication Dose Route Frequency Provider Last Rate Last Dose  . epoetin alfa (EPOGEN,PROCRIT) injection 10,000 Units  10,000 Units Subcutaneous Weekly Elmarie Shiley, MD        Allergies: Augmentin [amoxicillin-pot clavulanate];  Colchicine; Flexeril [cyclobenzaprine hcl]; Levaquin [levofloxacin in d5w]; Percocet [oxycodone-acetaminophen]; Zebeta; and Bisoprolol fumarate  Past Medical History:  Diagnosis Date  . Bilateral shoulder pain 07/28/2016  . Cancer [facility])    prostate  . Chest pain 10/27/2016  . Chronic anemia    With normal iron studies and normal serum protein electrophoresis  . Coronary artery disease    Mild  . Cough 10/27/2016  . Diabetes mellitus    Adult onset  . Essential hypertension   . GERD (gastroesophageal reflux disease)    occ tums  . History of gout   . Hyperbilirubinemia 06/15/2017  . Hyponatremia 06/15/2017  . Intermittent palpitations 07/23/2014  . Liver mass   . Mass of bile duct 06/27/2017  . Mild renal insufficiency   . Nocturia    x2  . Normocytic anemia 06/23/2016  . Osteoarthritis   . Sciatica of right side 07/28/2016  . Statin myopathy 07/28/2016  . Weight loss 06/15/2017    Past Surgical History:  Procedure Laterality Date  . CHOLECYSTECTOMY  02/04/2014   DR Zella Richer  . CHOLECYSTECTOMY N/A 02/04/2014   Procedure: LAPAROSCOPIC CHOLECYSTECTOMY with intraoperative cholangiogram;  Surgeon: Odis Hollingshead, MD;  Location: [place];  Service: General;  Laterality: N/A;  . ERCP N/A 10/15/2013   Procedure: ENDOSCOPIC RETROGRADE CHOLANGIOPANCREATOGRAPHY (ERCP);  Surgeon: Jeryl Columbia, MD;  Location: [facility];  Service: Endoscopy;  Laterality: N/A;  . KNEE SURGERY Right 1979  . melanoma surgery  1970's   on scalp  . PROSTATE SURGERY  1993   Prostate Cancer  . SPHINCTEROTOMY  10/15/2013   Procedure: SPHINCTEROTOMY;  Surgeon: Jeryl Columbia, MD;  Location: [facility];  Service: Endoscopy;;    Family History  Problem Relation Age of Onset  . Stroke Father   . Stroke Mother   . Stroke Sister   . Stroke Brother   . Stroke Brother   . Stroke Brother     Social History   Tobacco Use  . Smoking status: Former Smoker    Packs/day: 1.00    Years: 45.00    Pack years:  45.00    Types: Cigarettes    Last attempt to quit: 09/07/1979    Years since quitting: 39.3  . Smokeless tobacco: Never Used  Substance Use Topics  . Alcohol use: Yes    Comment: beer rarely    Subjective:  Hospitalized last week at [facility] with complications from known intrahepatic cholangiocarcinoma; during course of stay, developed intermittent a. Fib; needs referral to specialist here; oncologist at [facility] is asking for labs to be checked monthly; Patient notes he is slowly improving- feeling like his stools are still loose but normalizing; no chest pain, no shortness of breath;  Objective:  Vitals:   01/07/19 1007  BP: 124/78  Pulse: 69  Temp: 97.9 F (36.6 C)  TempSrc: Oral  SpO2: 99%  Weight: 139 lb 0.6 oz (63.1 kg)  Height: 5'  7.5" (1.715 m)    General: Well developed, well nourished, in no acute distress  Skin : Warm and dry.  Head: Normocephalic and atraumatic  Eyes: Sclera and conjunctiva clear; pupils round and reactive to light; extraocular movements intact  Ears: External normal; canals clear; tympanic membranes normal  Oropharynx: Pink, supple. No suspicious lesions  Neck: Supple without thyromegaly, adenopathy  Lungs: Respirations unlabored; clear to auscultation bilaterally without wheeze, rales, rhonchi  CVS exam: normal rate and regular rhythm.  Neurologic: Alert and oriented; speech intact; face symmetrical; moves all extremities well; CNII-XII intact without focal deficit   Assessment:  1. Intrahepatic cholangiocarcinoma ([place])   2. Atrial fibrillation, unspecified type ([place])     Plan:  1. Plan for labs next month- will forward to oncology; 2. Refer to Dr. Rayann Heman per patient request; follow-up to be determined.   No follow-ups on file.  No orders of the defined types were placed in this encounter.   Requested Prescriptions    No prescriptions requested or ordered in this encounter

## 2019-01-07 NOTE — Progress Notes (Unsigned)
Gerald Jacobs is a 85 y.o. male with the following history as recorded in EpicCare:  Patient Active Problem List   Diagnosis Date Noted  . Intrahepatic cholangiocarcinoma ([facility]) 06/27/2017  . Liver mass   . Hyperbilirubinemia 06/15/2017  . Essential hypertension 09/02/2016  . Statin myopathy 07/28/2016  . Normocytic anemia 06/23/2016  . Medicare annual wellness visit, subsequent 01/18/2016  . Routine general medical examination at a health care facility 01/18/2016  . Frequent PVCs 11/24/2014  . Intermittent palpitations 07/23/2014  . Chronic cholecystitis with calculus 02/04/2014  . Choledocholithiasis 08/28/2013  . Symptomatic cholelithiasis 06/04/2013  . Elevated LFTs 06/04/2013  . Benign hypertensive heart disease without heart failure 09/07/2011  . Type 2 diabetes mellitus with stage 4 chronic kidney disease, without long-term current use of insulin ([facility]) 09/07/2011  . Dyslipidemia 09/07/2011  . CKD (chronic kidney disease), stage IV ([facility]) 09/07/2011    Current Outpatient Medications  Medication Sig Dispense Refill  . allopurinol (ZYLOPRIM) 100 MG tablet TAKE 1 TABLET BY MOUTH EVERY DAY 90 tablet 0  . amLODipine (NORVASC) 5 MG tablet TAKE 1 TABLET BY MOUTH EVERY DAY 90 tablet 1  . aspirin 81 MG tablet Take 81 mg by mouth daily.      . Biotin 1000 MCG tablet Take 1,000 mcg by mouth daily.    . cholecalciferol (VITAMIN D) 1000 units tablet Take 2,000 Units by mouth daily.     . Dextran 70-Hypromellose (ARTIFICIAL TEARS) 0.1-0.3 % SOLN Apply to eye.    . diphenhydramine-acetaminophen (TYLENOL PM) 25-500 MG TABS tablet Take 1 tablet by mouth at bedtime as needed (pain).    . feeding supplement, GLUCERNA SHAKE, (GLUCERNA SHAKE) LIQD Take 237 mLs by mouth 2 (two) times daily between meals. 237 mL 0  . furosemide (LASIX) 20 MG tablet Take 20 mg by mouth daily.  0  . glucose blood (ONE TOUCH ULTRA TEST) test strip USE ONE STRIP PER TEST. CHECK BLOOD SUGARS 1-4 TIMES PER DAY AS  INSTRUCTED. 100 each 4  . losartan (COZAAR) 50 MG tablet Take 50 mg by mouth daily.  1  . metoprolol succinate (TOPROL-XL) 50 MG 24 hr tablet Take 1 tablet (50 mg total) by mouth every morning. 90 tablet 1  . neomycin-polymyxin-hydrocortisone (CORTISPORIN) OTIC solution Place 3 drops into the left ear 3 (three) times daily. 10 mL 0  . nystatin (MYCOSTATIN) 100000 UNIT/ML suspension     . ondansetron (ZOFRAN) 8 MG tablet Take by mouth every 8 (eight) hours as needed for nausea or vomiting.    . ONETOUCH DELICA LANCETS 33G MISC TEST BLOOD SUGARS 1-4 TIMES A DAY AS INSTRUCTED. DX CODE: E11.9 100 each 2  . oseltamivir (TAMIFLU) 75 MG capsule Take 1 capsule (75 mg total) by mouth daily. 10 capsule 0  . pantoprazole (PROTONIX) 40 MG tablet Take 40 mg by mouth daily as needed (indigestion).     . promethazine (PHENERGAN) 12.5 MG tablet Take 12.5 mg by mouth every 6 (six) hours as needed for nausea or vomiting.    . protein supplement shake (PREMIER PROTEIN) LIQD Take 2 oz by mouth daily.    . sitaGLIPtin (JANUVIA) 50 MG tablet Take 1 tablet (50 mg total) by mouth daily. 90 tablet 1  . sodium chloride (SALINE MIST) 0.65 % nasal spray Place into the nose.    . traZODone (DESYREL) 50 MG tablet Take 0.5-1 tablets (25-50 mg total) by mouth at bedtime as needed for sleep. (Patient not taking: Reported on 01/07/2019) 30 tablet 3  .   triamcinolone (KENALOG) 0.025 % cream Apply 1 application topically 2 (two) times daily as needed (itching).     . triamcinolone (KENALOG) 0.1 % paste Use as directed 1 application in the mouth or throat 2 (two) times daily.      No current facility-administered medications for this visit.    Facility-Administered Medications Ordered in Other Visits  Medication Dose Route Frequency Provider Last Rate Last Dose  . epoetin alfa (EPOGEN,PROCRIT) injection 10,000 Units  10,000 Units Subcutaneous Weekly Elmarie Shiley, MD        Allergies: Augmentin [amoxicillin-pot clavulanate];  Colchicine; Flexeril [cyclobenzaprine hcl]; Levaquin [levofloxacin in d5w]; Percocet [oxycodone-acetaminophen]; Zebeta; and Bisoprolol fumarate  Past Medical History:  Diagnosis Date  . Bilateral shoulder pain 07/28/2016  . Cancer [facility])    prostate  . Chest pain 10/27/2016  . Chronic anemia    With normal iron studies and normal serum protein electrophoresis  . Coronary artery disease    Mild  . Cough 10/27/2016  . Diabetes mellitus    Adult onset  . Essential hypertension   . GERD (gastroesophageal reflux disease)    occ tums  . History of gout   . Hyperbilirubinemia 06/15/2017  . Hyponatremia 06/15/2017  . Intermittent palpitations 07/23/2014  . Liver mass   . Mass of bile duct 06/27/2017  . Mild renal insufficiency   . Nocturia    x2  . Normocytic anemia 06/23/2016  . Osteoarthritis   . Sciatica of right side 07/28/2016  . Statin myopathy 07/28/2016  . Weight loss 06/15/2017    Past Surgical History:  Procedure Laterality Date  . CHOLECYSTECTOMY  02/04/2014   DR Zella Richer  . CHOLECYSTECTOMY N/A 02/04/2014   Procedure: LAPAROSCOPIC CHOLECYSTECTOMY with intraoperative cholangiogram;  Surgeon: Odis Hollingshead, MD;  Location: [place];  Service: General;  Laterality: N/A;  . ERCP N/A 10/15/2013   Procedure: ENDOSCOPIC RETROGRADE CHOLANGIOPANCREATOGRAPHY (ERCP);  Surgeon: Jeryl Columbia, MD;  Location[facility];  Service: Endoscopy;  Laterality: N/A;  . KNEE SURGERY Right 1979  . melanoma surgery  1970's   on scalp  . PROSTATE SURGERY  1993   Prostate Cancer  . SPHINCTEROTOMY  10/15/2013   Procedure: SPHINCTEROTOMY;  Surgeon: Jeryl Columbia, MD;  Location: [facility];  Service: Endoscopy;;    Family History  Problem Relation Age of Onset  . Stroke Father   . Stroke Mother   . Stroke Sister   . Stroke Brother   . Stroke Brother   . Stroke Brother     Social History   Tobacco Use  . Smoking status: Former Smoker    Packs/day: 1.00    Years: 45.00    Pack years:  45.00    Types: Cigarettes    Last attempt to quit: 09/07/1979    Years since quitting: 39.3  . Smokeless tobacco: Never Used  Substance Use Topics  . Alcohol use: Yes    Comment: beer rarely    Subjective:  ***   Objective:  There were no vitals filed for this visit.  General: Well developed, well nourished, in no acute distress  Skin : Warm and dry.  Head: Normocephalic and atraumatic  Eyes: Sclera and conjunctiva clear; pupils round and reactive to light; extraocular movements intact  Ears: External normal; canals clear; tympanic membranes normal  Oropharynx: Pink, supple. No suspicious lesions  Neck: Supple without thyromegaly, adenopathy  Lungs: Respirations unlabored; clear to auscultation bilaterally without wheeze, rales, rhonchi  CVS exam: {heart exam:[id number]::"normal rate, regular rhythm, normal S1, S2,  no murmurs, rubs, clicks or gallops"}.  Abdomen: Soft; nontender; nondistended; normoactive bowel sounds; no masses or hepatosplenomegaly  Musculoskeletal: No deformities; no active joint inflammation  Extremities: No edema, cyanosis, clubbing  Vessels: Symmetric bilaterally  Neurologic: Alert and oriented; speech intact; face symmetrical; moves all extremities well; CNII-XII intact without focal deficit  Assessment:  1. Atrial fibrillation, unspecified type ([facility])   2. Intrahepatic cholangiocarcinoma ([facility])     Plan:  ***   No follow-ups on file.  Orders Placed This Encounter  Procedures  . CBC w/Diff    Standing Status:   Future    Standing Expiration Date:   01/07/2020  . Comp Met (CMET)    Standing Status:   Future    Standing Expiration Date:   01/07/2020  . Ambulatory referral to Cardiology    Referral Priority:   Emergency    Referral Type:   Consultation    Referral Reason:   Specialty Services Required    Referred to Provider:   Allred, James, MD    Requested Specialty:   Cardiology    Number of Visits Requested:   1    Requested Prescriptions    No  prescriptions requested or ordered in this encounter     

## 2019-01-14 ENCOUNTER — Ambulatory Visit: Payer: Medicare Other | Admitting: Cardiovascular Disease

## 2019-01-14 ENCOUNTER — Encounter: Payer: Self-pay | Admitting: Cardiovascular Disease

## 2019-01-14 ENCOUNTER — Ambulatory Visit (HOSPITAL_COMMUNITY)
Admission: RE | Admit: 2019-01-14 | Discharge: 2019-01-14 | Disposition: A | Discharge status: Home / Self Care | Payer: Medicare Other | Source: Ambulatory Visit | Attending: Nephrology | Admitting: Nephrology

## 2019-01-14 VITALS — BP 163/67 | HR 70 | Temp 97.8°F | Resp 20

## 2019-01-14 VITALS — BP 134/68 | HR 72 | Ht 67.5 in

## 2019-01-14 DIAGNOSIS — D649 Anemia, unspecified: Secondary | ICD-10-CM | POA: Insufficient documentation

## 2019-01-14 DIAGNOSIS — I4819 Other persistent atrial fibrillation: Secondary | ICD-10-CM | POA: Diagnosis not present

## 2019-01-14 LAB — POCT HEMOGLOBIN-HEMACUE: Hemoglobin: 13.9 g/dL (ref 13.0–17.0)

## 2019-01-14 MED ORDER — EPOETIN ALFA 10000 UNIT/ML IJ SOLN: 10000.0000 [IU] | INTRAMUSCULAR | Status: DC

## 2019-01-14 MED ORDER — APIXABAN 2.5 MG PO TABS: 2.5000 mg | ORAL_TABLET | Freq: Two times a day (BID) | ORAL | 11 refills | Status: DC

## 2019-01-14 NOTE — Patient Instructions (Signed)
Medication Instructions:  Your physician has recommended you make the following change in your medication:  STOP Aspirin START Eliquis (Apixaban) 2.5 mg twice daily  If you need a refill on your cardiac medications before your next appointment, please call your pharmacy.   Lab work:Your physician recommends that you return for lab work in: 1 month on the same day you see the pharmacist  You do not have to fast for this appointment    Testing/Procedures: None Ordered   Follow-Up: Your physician recommends that you schedule a follow-up appointment in:  1 month with Pharmacist for new anticoagulation therapy  At [facility], you and your health needs are our priority.  As part of our continuing mission to provide you with exceptional heart care, we have created designated Provider Care Teams.  These Care Teams include your primary Cardiologist (physician) and Advanced Practice Providers (APPs -  Physician Assistants and Nurse Practitioners) who all work together to provide you with the care you need, when you need it. You will need a follow up appointment in:  6 months.  Please call our office 2 months in advance to schedule this appointment.  You may see Mertie Moores, MD or one of the following Advanced Practice Providers on your designated Care Team: Richardson Dopp, PA-C [place], [place] . Daune Perch, NP

## 2019-01-14 NOTE — Progress Notes (Signed)
CARDIOLOGY OFFICE NOTE  Date:  01/14/2019    Gerald Jacobs Date of Birth: Jun 15, 1925 Medical Record #[id number]  PCP:  Marrian Salvage, FNP  Cardiologist:    Genice Rouge   Chief Complaint  Patient presents with  . Hypertension   Problem List 1. Essential HTN 2. DM 3. CKD - sees [name],   baseline Cr = 1.94 4, Anemia - taking procrit  5. 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  6. PVCs    Notes from Truitt Merle:   Gerald Jacobs is a 85 y.o. male who presents today for a 4 month check. Former patient of Dr. Sherryl Barters.   He has a history of HTN, palpitations, dyslipidemia, diabetes, and mild renal insufficiency. Also has a history of hyperuricemia. He has anemia secondary to chronic renal disease and receives ejections of Procrit about every 3 weeks depending on his hemoglobin level. Dr. Posey Pronto is his nephrologist.   He was last seen back in November and felt to be doing well. Had his Medicare well check last month by PCP.   Comes back today. Here alone. Wife was to be seen also today - but having shoulder issues due to a fall and has cancelled. He is doing ok. No chest pain. He is trying to stay active but notes he has slowed down some. Has stable DOE. No falls. No passing out. Occasional palpitations but nothing that really worries him or produces symptoms. He is pleased with how he is doing. He has a brother who is alive at 49.   Oct. 6, 2017: This is the first time meeting [name] . He is a transfer from [place]. He has a history of hypertension and diabetes Is doing well. Has some shoulder issues Also goes [facility] the [facility]   Plays golf on Thursdays - limited by his shoulder pain  No longer does his yard work  No CP ,  Occasional dyspnea - also has some anemia  BP is a bit high today . Takes his BP at home and the reading are typically quite good.  Retired from [employer] - retired in 1989.    March 15, 2017: [name] is seen back today for follow up of his HTN. BP has been a little high  Not playing much golf recently .   Nov. 6, 2018:  Having some palpitations.   Has had these in the past.   Metoprolol was increased which solved the issue  Also has bile duct cancer - is under some additional stress due to this  Has lost 10-15 labs over the past 6 months .   Unable to eat much  No energy.   Has had anemia.  Unable to do any yard work now  May 10, 2018:  Was hospitalized at [facility] recently    For complications of bile duct cancer .   Has had previous stenting of his bile duct.   Has had them replaced several times.  Cancer has progressed slightly .    Feb. 17, 2020: Has been back in the hospital at [facility].  These was some discussion about stopping ASA  Has issues with anemia. Was found to have atrial fib at his last Appt at [facility] oncology.   Was recommended to see Dr. Rayann Heman.   He is completely asymptomatic from an atrial fib standpoint.   He cannot tell that his HR is irreg.  Has chronic  anemia but denies any bleeding in his stool or urine.      Past Medical History:  Diagnosis Date  . Bilateral shoulder pain 07/28/2016  . Cancer [facility])    prostate  . Chest pain 10/27/2016  . Chronic anemia    With normal iron studies and normal serum protein electrophoresis  . Coronary artery disease    Mild  . Cough 10/27/2016  . Diabetes mellitus    Adult onset  . Essential hypertension   . GERD (gastroesophageal reflux disease)    occ tums  . History of gout   . Hyperbilirubinemia 06/15/2017  . Hyponatremia 06/15/2017  . Intermittent palpitations 07/23/2014  . Liver mass   . Mass of bile duct 06/27/2017  . Mild renal insufficiency   . Nocturia    x2  . Normocytic anemia 06/23/2016  . Osteoarthritis   . Sciatica of right side 07/28/2016  . Statin myopathy 07/28/2016  . Weight loss 06/15/2017    Past Surgical History:  Procedure Laterality Date  . CHOLECYSTECTOMY  02/04/2014    DR Zella Richer  . CHOLECYSTECTOMY N/A 02/04/2014   Procedure: LAPAROSCOPIC CHOLECYSTECTOMY with intraoperative cholangiogram;  Surgeon: Odis Hollingshead, MD;  Location: [facility];  Service: General;  Laterality: N/A;  . ERCP N/A 10/15/2013   Procedure: ENDOSCOPIC RETROGRADE CHOLANGIOPANCREATOGRAPHY (ERCP);  Surgeon: Jeryl Columbia, MD;  Location: [facility];  Service: Endoscopy;  Laterality: N/A;  . KNEE SURGERY Right 1979  . melanoma surgery  1970's   on scalp  . PROSTATE SURGERY  1993   Prostate Cancer  . SPHINCTEROTOMY  10/15/2013   Procedure: SPHINCTEROTOMY;  Surgeon: Jeryl Columbia, MD;  Location: [facility];  Service: Endoscopy;;     Medications: Current Outpatient Medications  Medication Sig Dispense Refill  . allopurinol (ZYLOPRIM) 100 MG tablet TAKE 1 TABLET BY MOUTH EVERY DAY 90 tablet 0  . amLODipine (NORVASC) 5 MG tablet TAKE 1 TABLET BY MOUTH EVERY DAY 90 tablet 1  . Biotin 1000 MCG tablet Take 1,000 mcg by mouth daily.    . cholecalciferol (VITAMIN D) 1000 units tablet Take 2,000 Units by mouth daily.     . diphenhydramine-acetaminophen (TYLENOL PM) 25-500 MG TABS tablet Take 1 tablet by mouth at bedtime as needed (pain).    . feeding supplement, GLUCERNA SHAKE, (GLUCERNA SHAKE) LIQD Take 237 mLs by mouth 2 (two) times daily between meals. 237 mL 0  . glucose blood (ONE TOUCH ULTRA TEST) test strip USE ONE STRIP PER TEST. CHECK BLOOD SUGARS 1-4 TIMES PER DAY AS INSTRUCTED. 100 each 4  . metoprolol succinate (TOPROL-XL) 50 MG 24 hr tablet Take 50 mg by mouth 2 (two) times daily. Take with or immediately following a meal.    . ondansetron (ZOFRAN) 8 MG tablet Take by mouth every 8 (eight) hours as needed for nausea or vomiting.    Glory Rosebush DELICA LANCETS 85Y MISC TEST BLOOD SUGARS 1-4 TIMES A DAY AS INSTRUCTED. DX CODE: E11.9 100 each 2  . pantoprazole (PROTONIX) 40 MG tablet Take 40 mg by mouth daily as needed (indigestion).     . promethazine (PHENERGAN) 12.5 MG tablet Take  12.5 mg by mouth every 6 (six) hours as needed for nausea or vomiting.    . protein supplement shake (PREMIER PROTEIN) LIQD Take 2 oz by mouth daily.    . sitaGLIPtin (JANUVIA) 50 MG tablet Take 1 tablet (50 mg total) by mouth daily. 90 tablet 1  . sodium chloride (SALINE MIST) 0.65 % nasal spray Place  into the nose.    . triamcinolone (KENALOG) 0.025 % cream Apply 1 application topically 2 (two) times daily as needed (itching).     . triamcinolone (KENALOG) 0.1 % paste Use as directed 1 application in the mouth or throat 2 (two) times daily.     Marland Kitchen apixaban (ELIQUIS) 2.5 MG TABS tablet Take 1 tablet (2.5 mg total) by mouth 2 (two) times daily. 60 tablet 11   No current facility-administered medications for this visit.    Facility-Administered Medications Ordered in Other Visits  Medication Dose Route Frequency Provider Last Rate Last Dose  . epoetin alfa (EPOGEN,PROCRIT) injection 10,000 Units  10,000 Units Subcutaneous Weekly Elmarie Shiley, MD        Allergies: Allergies  Allergen Reactions  . Amoxicillin-Pot Clavulanate Diarrhea    Diarrhea  Patient reports contracted C.diff while taking this  . Colchicine Other (See Comments)    GI problems  . Flexeril [Cyclobenzaprine Hcl] Other (See Comments)    GI problems   . Levaquin [Levofloxacin In D5w] Other (See Comments)    Insomnia   . Percocet [Oxycodone-Acetaminophen] Nausea And Vomiting  . Zebeta Other (See Comments)    Gi problems  . Bisoprolol Fumarate Other (See Comments)    Gi problems    Social History: The patient  reports that he quit smoking about 39 years ago. His smoking use included cigarettes. He has a 45.00 pack-year smoking history. He has never used smokeless tobacco. He reports current alcohol use. He reports that he does not use drugs.   Family History: The patient's family history includes Stroke in his brother, brother, brother, father, mother, and sister.   Review of Systems: Please see the history of  present illness.   Otherwise, the review of systems is positive for none.   All other systems are reviewed and negative.   Physical Exam: VS:  BP 134/68   Pulse 72   Ht 5' 7.5" (1.715 m)   SpO2 98%   BMI 21.46 kg/m  .  BMI Body mass index is 21.46 kg/m.  Wt Readings from Last 3 Encounters:  01/07/19 139 lb 0.6 oz (63.1 kg)  12/25/18 142 lb 1.3 oz (64.4 kg)  12/03/18 144 lb 1.3 oz (65.4 kg)   Physical Exam: Blood pressure 134/68, pulse 72, height 5' 7.5" (1.715 m), SpO2 98 %.  GEN:  Elderly male.   NAD  HEENT: Normal NECK: No JVD; No carotid bruits LYMPHATICS: No lymphadenopathy CARDIAC:  Irreg. IRreg.  RESPIRATORY:  Clear to auscultation without rales, wheezing or rhonchi  ABDOMEN: Soft, non-tender, non-distended MUSCULOSKELETAL:  No edema; No deformity  SKIN: Warm and dry NEUROLOGIC:  Alert and oriented x 3    LABORATORY DATA:  EKG:   May 10, 2018:   NSR at 62 .  Normal ECG   Lab Results  Component Value Date   WBC 14.0 (H) 12/25/2018   HGB 13.9 01/14/2019   HCT 34.6 (L) 12/25/2018   PLT 149.0 (L) 12/25/2018   GLUCOSE 228 (H) 12/25/2018   CHOL 195 02/08/2017   TRIG 154.0 (H) 02/08/2017   HDL 74.00 02/08/2017   LDLCALC 90 02/08/2017   ALT 210 (H) 12/25/2018   AST 158 (H) 12/25/2018   NA 132 (L) 12/25/2018   K 4.3 12/25/2018   CL 99 12/25/2018   CREATININE 2.75 (H) 12/25/2018   BUN 45 (H) 12/25/2018   CO2 25 12/25/2018   TSH 1.954 10/03/2018   PSA 0.01 (L) 02/08/2017   INR 1.06 06/19/2017  HGBA1C 5.8 12/03/2018   MICROALBUR 92.6 (H) 05/21/2018    BNP (last 3 results) No results for input(s): BNP in the last 8760 hours.  ProBNP (last 3 results) No results for input(s): PROBNP in the last 8760 hours.   Other Studies Reviewed Today:   Assessment/Plan: 1. atrail fib:   He is asymptomatic.   I think we could start him on low-dose Eliquis and proceed with a rate control rate control and anticoagulation strategy.  His vital signs look stable.  He is  completely asymptomatic.     We will start Eliquis 2.5 mg twice a day.  We will have him see our Coumadin clinic for visit in 1 month.  We will check a basic metabolic profile and CBC at that time. History of anemia but this seems to be related to chronic kidney disease.  He denies any bleeding. Will see him in 6 months   2. PVCS:       3. CKD -   Stable   4. DM -    5. History of gout -     Current medicines are reviewed with the patient today.  The patient does not have concerns regarding medicines other than what has been noted above.  The following changes have been made:  See above.  Labs/ tests ordered today include:    Orders Placed This Encounter  Procedures  . Basic Metabolic Panel (BMET)  . CBC    Mertie Moores, MD  01/14/2019 5:33 PM    [address],  [place], [place]  [address] Pager [phone] Phone: [phone]; Fax: [phone]

## 2019-01-21 ENCOUNTER — Encounter (HOSPITAL_COMMUNITY): Payer: Medicare Other

## 2019-01-28 ENCOUNTER — Encounter (HOSPITAL_COMMUNITY)
Admission: RE | Admit: 2019-01-28 | Discharge: 2019-01-28 | Disposition: A | Discharge status: Home / Self Care | Payer: Medicare Other | Source: Ambulatory Visit | Attending: Nephrology | Admitting: Nephrology

## 2019-01-28 ENCOUNTER — Encounter: Payer: Self-pay | Admitting: Internal Medicine

## 2019-01-28 ENCOUNTER — Other Ambulatory Visit (INDEPENDENT_AMBULATORY_CARE_PROVIDER_SITE_OTHER): Payer: Medicare Other

## 2019-01-28 ENCOUNTER — Ambulatory Visit: Payer: Medicare Other | Admitting: Internal Medicine

## 2019-01-28 VITALS — BP 152/70 | HR 66 | Ht 66.0 in | Wt 137.6 lb

## 2019-01-28 VITALS — BP 152/69 | HR 69 | Temp 97.8°F | Resp 20

## 2019-01-28 DIAGNOSIS — I4891 Unspecified atrial fibrillation: Secondary | ICD-10-CM | POA: Diagnosis not present

## 2019-01-28 DIAGNOSIS — I119 Hypertensive heart disease without heart failure: Secondary | ICD-10-CM

## 2019-01-28 DIAGNOSIS — D631 Anemia in chronic kidney disease: Secondary | ICD-10-CM | POA: Diagnosis present

## 2019-01-28 DIAGNOSIS — C221 Intrahepatic bile duct carcinoma: Secondary | ICD-10-CM | POA: Diagnosis not present

## 2019-01-28 DIAGNOSIS — N184 Chronic kidney disease, stage 4 (severe): Secondary | ICD-10-CM | POA: Diagnosis not present

## 2019-01-28 DIAGNOSIS — D649 Anemia, unspecified: Secondary | ICD-10-CM

## 2019-01-28 LAB — CBC WITH DIFFERENTIAL/PLATELET
Basophils Absolute: 0 10*3/uL (ref 0.0–0.1)
Basophils Relative: 0.5 % (ref 0.0–3.0)
Eosinophils Absolute: 0.5 10*3/uL (ref 0.0–0.7)
Eosinophils Relative: 6.3 % — ABNORMAL HIGH (ref 0.0–5.0)
HCT: 34.7 % — ABNORMAL LOW (ref 39.0–52.0)
Hemoglobin: 11.6 g/dL — ABNORMAL LOW (ref 13.0–17.0)
LYMPHS ABS: 1.8 10*3/uL (ref 0.7–4.0)
Lymphocytes Relative: 23.4 % (ref 12.0–46.0)
MCHC: 33.3 g/dL (ref 30.0–36.0)
MCV: 91.2 fl (ref 78.0–100.0)
MONO ABS: 0.8 10*3/uL (ref 0.1–1.0)
MONOS PCT: 10 % (ref 3.0–12.0)
Neutro Abs: 4.5 10*3/uL (ref 1.4–7.7)
Neutrophils Relative %: 59.8 % (ref 43.0–77.0)
Platelets: 130 10*3/uL — ABNORMAL LOW (ref 150.0–400.0)
RBC: 3.81 Mil/uL — ABNORMAL LOW (ref 4.22–5.81)
RDW: 17.4 % — ABNORMAL HIGH (ref 11.5–15.5)
WBC: 7.5 10*3/uL (ref 4.0–10.5)

## 2019-01-28 LAB — COMPREHENSIVE METABOLIC PANEL
ALT: 22 U/L (ref 0–53)
AST: 20 U/L (ref 0–37)
Albumin: 3.9 g/dL (ref 3.5–5.2)
Alkaline Phosphatase: 94 U/L (ref 39–117)
BUN: 45 mg/dL — ABNORMAL HIGH (ref 6–23)
CO2: 23 mEq/L (ref 19–32)
Calcium: 8.9 mg/dL (ref 8.4–10.5)
Chloride: 103 mEq/L (ref 96–112)
Creatinine, Ser: 3.03 mg/dL — ABNORMAL HIGH (ref 0.40–1.50)
GFR: 19.48 mL/min — ABNORMAL LOW (ref >60.00)
Glucose, Bld: 137 mg/dL — ABNORMAL HIGH (ref 70–99)
Potassium: 4.5 mEq/L (ref 3.5–5.1)
Sodium: 135 mEq/L (ref 135–145)
TOTAL PROTEIN: 7.3 g/dL (ref 6.0–8.3)
Total Bilirubin: 0.6 mg/dL (ref 0.2–1.2)

## 2019-01-28 LAB — FERRITIN: Ferritin: 507 ng/mL — ABNORMAL HIGH (ref 24–336)

## 2019-01-28 LAB — IRON AND TIBC
IRON: 150 ug/dL (ref 45–182)
Saturation Ratios: 63 % — ABNORMAL HIGH (ref 17.9–39.5)
TIBC: 237 ug/dL — AB (ref 250–450)
UIBC: 87 ug/dL

## 2019-01-28 LAB — POCT HEMOGLOBIN-HEMACUE: Hemoglobin: 12 g/dL — ABNORMAL LOW (ref 13.0–17.0)

## 2019-01-28 MED ORDER — EPOETIN ALFA 10000 UNIT/ML IJ SOLN: 10000.0000 [IU] | INTRAMUSCULAR | Status: DC

## 2019-01-28 NOTE — Patient Instructions (Addendum)
Medication Instructions:  Your physician recommends that you continue on your current medications as directed. Please refer to the Current Medication list given to you today.  Labwork: None ordered.  Testing/Procedures: None ordered.  Follow-Up: Your physician wants you to follow-up in: as needed with Dr. Allred.       Any Other Special Instructions Will Be Listed Below (If Applicable).  If you need a refill on your cardiac medications before your next appointment, please call your pharmacy.   

## 2019-01-28 NOTE — Progress Notes (Signed)
Electrophysiology Office Note   Date:  01/28/2019   ID:  Maya, Scholer [birth year], MRN [id number]  PCP:  Marrian Salvage, FNP  Cardiologist:  Dr Acie Fredrickson Primary Electrophysiologist: Thompson Grayer, MD    CC: afib   History of Present Illness: Gerald Jacobs is a 85 y.o. male who presents today for electrophysiology evaluation.   He is referred by Dr Acie Fredrickson for EP consultation regarding his afib.  He has a h/o biliary duct cancer and is being managed at [facility].  He was found to have AF on evaluation at [facility] 01/14/2019.  He was completely asymptomatic.  He has since seen Dr Acie Fredrickson.  He has been started on eliquis. In retrospect, he has had palpitations for many years.  He is mostly unaware of any sustained symptoms.  He is active for his age. Today, he denies symptoms of palpitations, chest pain, shortness of breath, orthopnea, PND, lower extremity edema, claudication, dizziness, presyncope, syncope, bleeding, or neurologic sequela. The patient is tolerating medications without difficulties and is otherwise without complaint today.    Past Medical History:  Diagnosis Date  . Bilateral shoulder pain 07/28/2016  . Cancer [facility])    prostate  . Chest pain 10/27/2016  . Chronic anemia    With normal iron studies and normal serum protein electrophoresis  . Coronary artery disease    Mild  . Cough 10/27/2016  . Diabetes mellitus    Adult onset  . Essential hypertension   . GERD (gastroesophageal reflux disease)    occ tums  . History of gout   . Hyperbilirubinemia 06/15/2017  . Hyponatremia 06/15/2017  . Intermittent palpitations 07/23/2014  . Liver mass   . Mass of bile duct 06/27/2017  . Mild renal insufficiency   . Nocturia    x2  . Normocytic anemia 06/23/2016  . Osteoarthritis   . Sciatica of right side 07/28/2016  . Statin myopathy 07/28/2016  . Weight loss 06/15/2017   Past Surgical History:  Procedure Laterality Date  . CHOLECYSTECTOMY  02/04/2014   DR Zella Richer    . CHOLECYSTECTOMY N/A 02/04/2014   Procedure: LAPAROSCOPIC CHOLECYSTECTOMY with intraoperative cholangiogram;  Surgeon: Odis Hollingshead, MD;  Location: [facility];  Service: General;  Laterality: N/A;  . ERCP N/A 10/15/2013   Procedure: ENDOSCOPIC RETROGRADE CHOLANGIOPANCREATOGRAPHY (ERCP);  Surgeon: Jeryl Columbia, MD;  Location: [facility];  Service: Endoscopy;  Laterality: N/A;  . KNEE SURGERY Right 1979  . melanoma surgery  1970's   on scalp  . PROSTATE SURGERY  1993   Prostate Cancer  . SPHINCTEROTOMY  10/15/2013   Procedure: SPHINCTEROTOMY;  Surgeon: Jeryl Columbia, MD;  Location: [facility];  Service: Endoscopy;;     Current Outpatient Medications  Medication Sig Dispense Refill  . allopurinol (ZYLOPRIM) 100 MG tablet TAKE 1 TABLET BY MOUTH EVERY DAY 90 tablet 0  . amLODipine (NORVASC) 5 MG tablet TAKE 1 TABLET BY MOUTH EVERY DAY 90 tablet 1  . apixaban (ELIQUIS) 2.5 MG TABS tablet Take 1 tablet (2.5 mg total) by mouth 2 (two) times daily. 60 tablet 11  . Biotin 1000 MCG tablet Take 1,000 mcg by mouth daily.    . cholecalciferol (VITAMIN D) 1000 units tablet Take 2,000 Units by mouth daily.     . diphenhydramine-acetaminophen (TYLENOL PM) 25-500 MG TABS tablet Take 1 tablet by mouth at bedtime as needed (pain).    Marland Kitchen epoetin alfa (EPOGEN,PROCRIT) 02585 UNIT/ML injection Inject 10,000 Units into the vein once a week.    Marland Kitchen  feeding supplement, GLUCERNA SHAKE, (GLUCERNA SHAKE) LIQD Take 237 mLs by mouth 2 (two) times daily between meals. 237 mL 0  . glucose blood (ONE TOUCH ULTRA TEST) test strip USE ONE STRIP PER TEST. CHECK BLOOD SUGARS 1-4 TIMES PER DAY AS INSTRUCTED. 100 each 4  . metoprolol succinate (TOPROL-XL) 50 MG 24 hr tablet Take 50 mg by mouth 2 (two) times daily. Take with or immediately following a meal.    . ondansetron (ZOFRAN) 8 MG tablet Take by mouth every 8 (eight) hours as needed for nausea or vomiting.    Glory Rosebush DELICA LANCETS 11X MISC TEST BLOOD SUGARS 1-4 TIMES  A DAY AS INSTRUCTED. DX CODE: E11.9 100 each 2  . promethazine (PHENERGAN) 12.5 MG tablet Take 12.5 mg by mouth every 6 (six) hours as needed for nausea or vomiting.    . protein supplement shake (PREMIER PROTEIN) LIQD Take 2 oz by mouth daily.    . sitaGLIPtin (JANUVIA) 50 MG tablet Take 1 tablet (50 mg total) by mouth daily. 90 tablet 1  . sodium chloride (SALINE MIST) 0.65 % nasal spray Place into the nose.    . triamcinolone (KENALOG) 0.025 % cream Apply 1 application topically 2 (two) times daily as needed (itching).     . triamcinolone (KENALOG) 0.1 % paste Use as directed 1 application in the mouth or throat 2 (two) times daily.     . pantoprazole (PROTONIX) 40 MG tablet Take 40 mg by mouth daily as needed (indigestion).      No current facility-administered medications for this visit.    Facility-Administered Medications Ordered in Other Visits  Medication Dose Route Frequency Provider Last Rate Last Dose  . epoetin alfa (EPOGEN,PROCRIT) injection 10,000 Units  10,000 Units Subcutaneous Weekly Gerald Shiley, MD        Allergies:   Amoxicillin-pot clavulanate; Colchicine; Flexeril [cyclobenzaprine hcl]; Levaquin [levofloxacin in d5w]; Percocet [oxycodone-acetaminophen]; Zebeta; and Bisoprolol fumarate   Social History:  The patient  reports that he quit smoking about 39 years ago. His smoking use included cigarettes. He has a 45.00 pack-year smoking history. He has never used smokeless tobacco. He reports current alcohol use. He reports that he does not use drugs.   Family History:  The patient's  family history includes Stroke in his brother, brother, brother, father, mother, and sister.    ROS:  Please see the history of present illness.   All other systems are personally reviewed and negative.    PHYSICAL EXAM: VS:  BP (!) 152/70   Pulse 66   Ht 5\' 6"  (1.676 m)   Wt 137 lb 9.6 oz (62.4 kg)   SpO2 98%   BMI 22.21 kg/m  , BMI Body mass index is 22.21 kg/m. GEN: Well nourished,  well developed, in no acute distress  HEENT: normal  Neck: no JVD, carotid bruits, or masses Cardiac: RRR; no murmurs, rubs, or gallops,no edema  Respiratory:  clear to auscultation bilaterally, normal work of breathing GI: soft, nontender, nondistended, + BS MS: no deformity or atrophy  Skin: warm and dry  Neuro:  Strength and sensation are intact Psych: euthymic mood, full affect  EKG:  EKG is ordered today. The ekg ordered today is personally reviewed and shows sinus rhythm 66 bpm, PR 170 msec, QRS 72 msec   Recent Labs: 10/03/2018: TSH 1.954 01/28/2019: ALT 22; BUN 45; Creatinine, Ser 3.03; Hemoglobin 11.6; Platelets 130.0; Potassium 4.5; Sodium 135  personally reviewed   Lipid Panel     Component Value  Date/Time   CHOL 195 02/08/2017 0827   TRIG 154.0 (H) 02/08/2017 0827   HDL 74.00 02/08/2017 0827   CHOLHDL 3 02/08/2017 0827   VLDL 30.8 02/08/2017 0827   LDLCALC 90 02/08/2017 0827   personally reviewed   Wt Readings from Last 3 Encounters:  01/28/19 137 lb 9.6 oz (62.4 kg)  01/07/19 139 lb 0.6 oz (63.1 kg)  12/25/18 142 lb 1.3 oz (64.4 kg)      Other studies personally reviewed: Additional studies/ records that were reviewed today include: Dr Gerald Jacobs notes  Review of the above records today demonstrates: as above   ASSESSMENT AND PLAN:  1.  Paroxysmal atrial fibrillation Diagnosed 12/2018, but with minimal symptoms chads2vasc score is at least 4.  He is on renally adjusted eliquis and doing well. I would not advise AADs at this time. He is rate controlled with metoprolol.  2. HTN Stable No change required today  Follow-up:  I feel that Dr Acie Fredrickson has well managed his afib.  I am happy to see again if needed in the future.  He should otherwise continue to follow with Dr Acie Fredrickson  Current medicines are reviewed at length with the patient today.   The patient does not have concerns regarding his medicines.  The following changes were made today:  none  Labs/  tests ordered today include:  Orders Placed This Encounter  Procedures  . EKG 12-Lead     Signed, Thompson Grayer, MD  01/28/2019 12:12 PM     [place] [address] [phone] (office) [phone] (fax)

## 2019-02-04 ENCOUNTER — Encounter (HOSPITAL_COMMUNITY): Payer: Medicare Other

## 2019-02-05 ENCOUNTER — Other Ambulatory Visit: Payer: Self-pay | Admitting: Family

## 2019-02-05 ENCOUNTER — Other Ambulatory Visit (INDEPENDENT_AMBULATORY_CARE_PROVIDER_SITE_OTHER): Payer: Medicare Other

## 2019-02-05 DIAGNOSIS — C221 Intrahepatic bile duct carcinoma: Secondary | ICD-10-CM

## 2019-02-05 LAB — CBC WITH DIFFERENTIAL/PLATELET
Basophils Absolute: 0.1 10*3/uL (ref 0.0–0.1)
Basophils Relative: 0.7 % (ref 0.0–3.0)
Eosinophils Absolute: 0.5 10*3/uL (ref 0.0–0.7)
Eosinophils Relative: 6.5 % — ABNORMAL HIGH (ref 0.0–5.0)
HCT: 31.2 % — ABNORMAL LOW (ref 39.0–52.0)
Hemoglobin: 10.6 g/dL — ABNORMAL LOW (ref 13.0–17.0)
Lymphocytes Relative: 21.1 % (ref 12.0–46.0)
Lymphs Abs: 1.6 10*3/uL (ref 0.7–4.0)
MCHC: 33.9 g/dL (ref 30.0–36.0)
MCV: 90.9 fl (ref 78.0–100.0)
MONO ABS: 0.8 10*3/uL (ref 0.1–1.0)
Monocytes Relative: 10.2 % (ref 3.0–12.0)
Neutro Abs: 4.7 10*3/uL (ref 1.4–7.7)
Neutrophils Relative %: 61.5 % (ref 43.0–77.0)
PLATELETS: 116 10*3/uL — AB (ref 150.0–400.0)
RBC: 3.43 Mil/uL — ABNORMAL LOW (ref 4.22–5.81)
RDW: 17.1 % — ABNORMAL HIGH (ref 11.5–15.5)
WBC: 7.7 10*3/uL (ref 4.0–10.5)

## 2019-02-05 LAB — COMPREHENSIVE METABOLIC PANEL
ALK PHOS: 130 U/L — AB (ref 39–117)
ALT: 40 U/L (ref 0–53)
AST: 35 U/L (ref 0–37)
Albumin: 3.7 g/dL (ref 3.5–5.2)
BUN: 44 mg/dL — ABNORMAL HIGH (ref 6–23)
CO2: 24 mEq/L (ref 19–32)
Calcium: 8.8 mg/dL (ref 8.4–10.5)
Chloride: 105 mEq/L (ref 96–112)
Creatinine, Ser: 2.8 mg/dL — ABNORMAL HIGH (ref 0.40–1.50)
GFR: 21.33 mL/min — ABNORMAL LOW (ref >60.00)
Glucose, Bld: 143 mg/dL — ABNORMAL HIGH (ref 70–99)
Potassium: 4.9 mEq/L (ref 3.5–5.1)
Sodium: 137 mEq/L (ref 135–145)
TOTAL PROTEIN: 7 g/dL (ref 6.0–8.3)
Total Bilirubin: 0.5 mg/dL (ref 0.2–1.2)

## 2019-02-11 ENCOUNTER — Telehealth: Payer: Self-pay

## 2019-02-11 ENCOUNTER — Other Ambulatory Visit: Payer: Self-pay

## 2019-02-11 ENCOUNTER — Encounter (HOSPITAL_COMMUNITY)
Admission: RE | Admit: 2019-02-11 | Discharge: 2019-02-11 | Disposition: A | Discharge status: Home / Self Care | Payer: Medicare Other | Source: Ambulatory Visit | Attending: Nephrology | Admitting: Nephrology

## 2019-02-11 VITALS — BP 137/64 | HR 65 | Temp 97.9°F | Resp 20

## 2019-02-11 DIAGNOSIS — N184 Chronic kidney disease, stage 4 (severe): Secondary | ICD-10-CM | POA: Diagnosis not present

## 2019-02-11 DIAGNOSIS — D649 Anemia, unspecified: Secondary | ICD-10-CM

## 2019-02-11 LAB — POCT HEMOGLOBIN-HEMACUE: Hemoglobin: 9.5 g/dL — ABNORMAL LOW (ref 13.0–17.0)

## 2019-02-11 MED ORDER — EPOETIN ALFA 10000 UNIT/ML IJ SOLN
10000.0000 [IU] | INTRAMUSCULAR | Status: DC
  Administered 2019-02-11: 10000 [IU] via SUBCUTANEOUS

## 2019-02-11 MED ORDER — EPOETIN ALFA 10000 UNIT/ML IJ SOLN
INTRAMUSCULAR | Status: AC
  Filled 2019-02-11: qty 1

## 2019-02-11 NOTE — Telephone Encounter (Signed)
-----   Message from Leeroy Bock, [facility] sent at 02/11/2019  9:33 AM EDT ----- Ok to move out Eliquis follow up another 4-8 weeks

## 2019-02-11 NOTE — Telephone Encounter (Signed)
Called pt to reschedule eliquis check up due to covid 19 concerns pt was compliant

## 2019-02-12 ENCOUNTER — Ambulatory Visit: Payer: Medicare Other

## 2019-02-15 ENCOUNTER — Other Ambulatory Visit: Payer: Self-pay

## 2019-02-18 ENCOUNTER — Ambulatory Visit (HOSPITAL_COMMUNITY)
Admission: RE | Admit: 2019-02-18 | Discharge: 2019-02-18 | Disposition: A | Discharge status: Home / Self Care | Payer: Medicare Other | Source: Ambulatory Visit | Attending: Nephrology | Admitting: Nephrology

## 2019-02-18 ENCOUNTER — Other Ambulatory Visit: Payer: Self-pay

## 2019-02-18 VITALS — BP 155/58 | HR 62 | Temp 97.7°F | Resp 20

## 2019-02-18 DIAGNOSIS — D649 Anemia, unspecified: Secondary | ICD-10-CM

## 2019-02-18 DIAGNOSIS — D631 Anemia in chronic kidney disease: Secondary | ICD-10-CM | POA: Insufficient documentation

## 2019-02-18 DIAGNOSIS — N189 Chronic kidney disease, unspecified: Secondary | ICD-10-CM | POA: Insufficient documentation

## 2019-02-18 LAB — POCT HEMOGLOBIN-HEMACUE: Hemoglobin: 8.7 g/dL — ABNORMAL LOW (ref 13.0–17.0)

## 2019-02-18 MED ORDER — EPOETIN ALFA 10000 UNIT/ML IJ SOLN
INTRAMUSCULAR | Status: AC
  Administered 2019-02-18: 10000 [IU] via SUBCUTANEOUS
  Filled 2019-02-18: qty 1

## 2019-02-18 MED ORDER — EPOETIN ALFA 10000 UNIT/ML IJ SOLN
10000.0000 [IU] | INTRAMUSCULAR | Status: DC
  Administered 2019-02-18: 10000 [IU] via SUBCUTANEOUS

## 2019-02-25 ENCOUNTER — Ambulatory Visit (HOSPITAL_COMMUNITY)
Admission: RE | Admit: 2019-02-25 | Discharge: 2019-02-25 | Disposition: A | Discharge status: Home / Self Care | Payer: Medicare Other | Source: Ambulatory Visit | Attending: Nephrology | Admitting: Nephrology

## 2019-02-25 ENCOUNTER — Other Ambulatory Visit: Payer: Self-pay

## 2019-02-25 VITALS — BP 168/58 | HR 57 | Temp 97.9°F

## 2019-02-25 DIAGNOSIS — N184 Chronic kidney disease, stage 4 (severe): Secondary | ICD-10-CM | POA: Diagnosis not present

## 2019-02-25 DIAGNOSIS — Z79899 Other long term (current) drug therapy: Secondary | ICD-10-CM | POA: Insufficient documentation

## 2019-02-25 DIAGNOSIS — Z5181 Encounter for therapeutic drug level monitoring: Secondary | ICD-10-CM | POA: Insufficient documentation

## 2019-02-25 DIAGNOSIS — D631 Anemia in chronic kidney disease: Secondary | ICD-10-CM | POA: Diagnosis not present

## 2019-02-25 DIAGNOSIS — D649 Anemia, unspecified: Secondary | ICD-10-CM

## 2019-02-25 LAB — FERRITIN: FERRITIN: 442 ng/mL — AB (ref 24–336)

## 2019-02-25 LAB — IRON AND TIBC
Iron: 79 ug/dL (ref 45–182)
Saturation Ratios: 30 % (ref 17.9–39.5)
TIBC: 265 ug/dL (ref 250–450)
UIBC: 186 ug/dL

## 2019-02-25 LAB — POCT HEMOGLOBIN-HEMACUE: Hemoglobin: 9.3 g/dL — ABNORMAL LOW (ref 13.0–17.0)

## 2019-02-25 MED ORDER — EPOETIN ALFA 10000 UNIT/ML IJ SOLN
10000.0000 [IU] | INTRAMUSCULAR | Status: DC
  Administered 2019-02-25: 10000 [IU] via SUBCUTANEOUS

## 2019-02-25 MED ORDER — EPOETIN ALFA 10000 UNIT/ML IJ SOLN
INTRAMUSCULAR | Status: AC
  Administered 2019-02-25: 10000 [IU] via SUBCUTANEOUS
  Filled 2019-02-25: qty 1

## 2019-02-27 ENCOUNTER — Encounter: Payer: Self-pay | Admitting: Family

## 2019-02-27 ENCOUNTER — Other Ambulatory Visit: Payer: Self-pay | Admitting: Family

## 2019-02-27 DIAGNOSIS — C221 Intrahepatic bile duct carcinoma: Secondary | ICD-10-CM

## 2019-03-01 ENCOUNTER — Encounter: Payer: Self-pay | Admitting: Family

## 2019-03-01 ENCOUNTER — Other Ambulatory Visit (INDEPENDENT_AMBULATORY_CARE_PROVIDER_SITE_OTHER): Payer: Medicare Other

## 2019-03-01 DIAGNOSIS — C221 Intrahepatic bile duct carcinoma: Secondary | ICD-10-CM

## 2019-03-01 LAB — CBC WITH DIFFERENTIAL/PLATELET
Basophils Absolute: 0 10*3/uL (ref 0.0–0.1)
Basophils Relative: 0.5 % (ref 0.0–3.0)
Eosinophils Absolute: 0.3 10*3/uL (ref 0.0–0.7)
Eosinophils Relative: 6 % — ABNORMAL HIGH (ref 0.0–5.0)
HCT: 30.9 % — ABNORMAL LOW (ref 39.0–52.0)
Hemoglobin: 10.5 g/dL — ABNORMAL LOW (ref 13.0–17.0)
Lymphocytes Relative: 10.2 % — ABNORMAL LOW (ref 12.0–46.0)
Lymphs Abs: 0.6 10*3/uL — ABNORMAL LOW (ref 0.7–4.0)
MCHC: 33.9 g/dL (ref 30.0–36.0)
MCV: 94.2 fl (ref 78.0–100.0)
Monocytes Absolute: 0.8 10*3/uL (ref 0.1–1.0)
Monocytes Relative: 13.9 % — ABNORMAL HIGH (ref 3.0–12.0)
Neutro Abs: 4 10*3/uL (ref 1.4–7.7)
Neutrophils Relative %: 69.4 % (ref 43.0–77.0)
Platelets: 118 10*3/uL — ABNORMAL LOW (ref 150.0–400.0)
RBC: 3.28 Mil/uL — ABNORMAL LOW (ref 4.22–5.81)
RDW: 17.4 % — ABNORMAL HIGH (ref 11.5–15.5)
WBC: 5.8 10*3/uL (ref 4.0–10.5)

## 2019-03-01 LAB — COMPREHENSIVE METABOLIC PANEL
ALT: 13 U/L (ref 0–53)
AST: 16 U/L (ref 0–37)
Albumin: 3.8 g/dL (ref 3.5–5.2)
Alkaline Phosphatase: 116 U/L (ref 39–117)
BUN: 36 mg/dL — ABNORMAL HIGH (ref 6–23)
CO2: 25 mEq/L (ref 19–32)
Calcium: 8.7 mg/dL (ref 8.4–10.5)
Chloride: 95 mEq/L — ABNORMAL LOW (ref 96–112)
Creatinine, Ser: 2.63 mg/dL — ABNORMAL HIGH (ref 0.40–1.50)
GFR: 22.93 mL/min — ABNORMAL LOW (ref >60.00)
Glucose, Bld: 133 mg/dL — ABNORMAL HIGH (ref 70–99)
Potassium: 4.9 mEq/L (ref 3.5–5.1)
Sodium: 128 mEq/L — ABNORMAL LOW (ref 135–145)
Total Bilirubin: 0.5 mg/dL (ref 0.2–1.2)
Total Protein: 6.9 g/dL (ref 6.0–8.3)

## 2019-03-04 ENCOUNTER — Telehealth: Payer: Self-pay | Admitting: Family

## 2019-03-04 ENCOUNTER — Encounter (HOSPITAL_COMMUNITY)
Admission: RE | Admit: 2019-03-04 | Discharge: 2019-03-04 | Disposition: A | Discharge status: Home / Self Care | Payer: Medicare Other | Source: Ambulatory Visit | Attending: Nephrology | Admitting: Nephrology

## 2019-03-04 ENCOUNTER — Other Ambulatory Visit: Payer: Self-pay

## 2019-03-04 VITALS — BP 180/55 | HR 62 | Temp 97.8°F | Resp 20

## 2019-03-04 DIAGNOSIS — N184 Chronic kidney disease, stage 4 (severe): Secondary | ICD-10-CM | POA: Diagnosis present

## 2019-03-04 DIAGNOSIS — D631 Anemia in chronic kidney disease: Secondary | ICD-10-CM | POA: Diagnosis not present

## 2019-03-04 DIAGNOSIS — D649 Anemia, unspecified: Secondary | ICD-10-CM

## 2019-03-04 LAB — POCT HEMOGLOBIN-HEMACUE: Hemoglobin: 9.5 g/dL — ABNORMAL LOW (ref 13.0–17.0)

## 2019-03-04 MED ORDER — EPOETIN ALFA 10000 UNIT/ML IJ SOLN
INTRAMUSCULAR | Status: AC
  Administered 2019-03-04: 10:00:00 10000 [IU] via SUBCUTANEOUS
  Filled 2019-03-04: qty 1

## 2019-03-04 MED ORDER — EPOETIN ALFA 10000 UNIT/ML IJ SOLN
10000.0000 [IU] | INTRAMUSCULAR | Status: DC
  Administered 2019-03-04: 10:00:00 10000 [IU] via SUBCUTANEOUS

## 2019-03-04 NOTE — Telephone Encounter (Signed)
Copied from [place] [id number]. Topic: Quick Communication - Rx Refill/Question >> Mar 04, 2019  3:48 PM Mcneil, Ja-Kwan wrote: Medication: metoprolol succinate (TOPROL-XL) 50 MG 24 hr tablet  Has the patient contacted their pharmacy? no  Preferred Pharmacy (with phone number or street name): [id number] [place], [place] [phone] (Phone) [phone] (Fax)  Agent: Please be advised that RX refills may take up to 3 business days. We ask that you follow-up with your pharmacy.

## 2019-03-05 MED ORDER — METOPROLOL SUCCINATE ER 50 MG PO TB24: 50.0000 mg | ORAL_TABLET | Freq: Two times a day (BID) | ORAL | 3 refills | Status: DC

## 2019-03-06 ENCOUNTER — Ambulatory Visit: Payer: Medicare Other

## 2019-03-11 ENCOUNTER — Other Ambulatory Visit: Payer: Self-pay | Admitting: Family

## 2019-03-15 ENCOUNTER — Other Ambulatory Visit: Payer: Self-pay | Admitting: Pharmacist

## 2019-03-15 MED ORDER — APIXABAN 2.5 MG PO TABS: 2.5000 mg | ORAL_TABLET | Freq: Two times a day (BID) | ORAL | 1 refills | Status: DC

## 2019-03-18 ENCOUNTER — Telehealth: Payer: Self-pay | Admitting: Cardiovascular Disease

## 2019-03-18 NOTE — Telephone Encounter (Signed)
Xarelto will be the same price as Eliquis. Would advise against sharing rx with his wife as this will make her run out of her prescription sooner and the pharmacy will not be able to refill it at that time. He would qualify for Xarelto 15mg  daily with CrCl of 64mL/min. He should take his AM dose of Eliquis tomorrow and take his first dose of Xarelto tomorrow evening with dinner.

## 2019-03-18 NOTE — Telephone Encounter (Signed)
Spoke with patient and his daugher about Eliquis being the safest option for his renal function. Eliquis should be the same cost as Xarelto.  Cost of Rx was 167/90 days. [facility] to make sure cost of 167/90 days was not including a deductible and make sure that Xarelto would be the same cost. Confirmed he has already met deducible and Xarelto would be same cost. Daughter and patient are ok with paying this and patient will stay on Eliquis. Confirmed pharmacy is filling rx.

## 2019-03-18 NOTE — Telephone Encounter (Signed)
° ° °  Patient is requesting prescription for Xarelto.

## 2019-03-18 NOTE — Telephone Encounter (Signed)
Spoke with patient who states he would like to switch to Xarelto because his wife is on Xarelto and it is easier for them to share medication during the Covid 19 restrictions. I advised that I will forward to PharmD for verification of dose. I verified that his pharmacy is correct and advised him that he will start the Xarelto tomorrow night after he takes his last Eliqius tonight. He verbalized understanding and agreement and thanked me for the call.

## 2019-03-18 NOTE — Telephone Encounter (Signed)
New Message    Pt c/o medication issue:  1. Name of Medication: Eliquis   2. How are you currently taking this medication (dosage and times per day)? 2.5mg  2 x daily   3. Are you having a reaction (difficulty breathing--STAT)? No  4. What is your medication issue? Pt went to get his prescription and it was too expensive. He also would like to start taking his baby aspirin again

## 2019-03-19 ENCOUNTER — Encounter: Payer: Self-pay | Admitting: Family

## 2019-03-19 ENCOUNTER — Other Ambulatory Visit: Payer: Self-pay | Admitting: *Deleted

## 2019-03-19 MED ORDER — ALLOPURINOL 100 MG PO TABS: 100.0000 mg | ORAL_TABLET | Freq: Every day | ORAL | 0 refills | Status: DC

## 2019-03-21 ENCOUNTER — Other Ambulatory Visit: Payer: Self-pay | Admitting: Family

## 2019-03-25 ENCOUNTER — Other Ambulatory Visit (INDEPENDENT_AMBULATORY_CARE_PROVIDER_SITE_OTHER): Payer: Medicare Other

## 2019-03-25 ENCOUNTER — Telehealth: Payer: Self-pay

## 2019-03-25 ENCOUNTER — Encounter: Payer: Self-pay | Admitting: Family

## 2019-03-25 ENCOUNTER — Other Ambulatory Visit: Payer: Self-pay | Admitting: Family

## 2019-03-25 DIAGNOSIS — N184 Chronic kidney disease, stage 4 (severe): Secondary | ICD-10-CM

## 2019-03-25 DIAGNOSIS — R109 Unspecified abdominal pain: Secondary | ICD-10-CM

## 2019-03-25 DIAGNOSIS — C221 Intrahepatic bile duct carcinoma: Secondary | ICD-10-CM

## 2019-03-25 LAB — CBC WITH DIFFERENTIAL/PLATELET
Basophils Absolute: 0.1 10*3/uL (ref 0.0–0.1)
Basophils Relative: 1 % (ref 0.0–3.0)
Eosinophils Absolute: 0.4 10*3/uL (ref 0.0–0.7)
Eosinophils Relative: 5.6 % — ABNORMAL HIGH (ref 0.0–5.0)
HCT: 32.3 % — ABNORMAL LOW (ref 39.0–52.0)
Hemoglobin: 11.2 g/dL — ABNORMAL LOW (ref 13.0–17.0)
Lymphocytes Relative: 19.6 % (ref 12.0–46.0)
Lymphs Abs: 1.3 10*3/uL (ref 0.7–4.0)
MCHC: 34.5 g/dL (ref 30.0–36.0)
MCV: 94.4 fl (ref 78.0–100.0)
Monocytes Absolute: 0.6 10*3/uL (ref 0.1–1.0)
Monocytes Relative: 8.9 % (ref 3.0–12.0)
Neutro Abs: 4.3 10*3/uL (ref 1.4–7.7)
Neutrophils Relative %: 64.9 % (ref 43.0–77.0)
Platelets: 161 10*3/uL (ref 150.0–400.0)
RBC: 3.43 Mil/uL — ABNORMAL LOW (ref 4.22–5.81)
RDW: 15.9 % — ABNORMAL HIGH (ref 11.5–15.5)
WBC: 6.6 10*3/uL (ref 4.0–10.5)

## 2019-03-25 LAB — COMPREHENSIVE METABOLIC PANEL
ALT: 20 U/L (ref 0–53)
AST: 21 U/L (ref 0–37)
Albumin: 4 g/dL (ref 3.5–5.2)
Alkaline Phosphatase: 81 U/L (ref 39–117)
BUN: 61 mg/dL — ABNORMAL HIGH (ref 6–23)
CO2: 24 mEq/L (ref 19–32)
Calcium: 8.8 mg/dL (ref 8.4–10.5)
Chloride: 99 mEq/L (ref 96–112)
Creatinine, Ser: 3 mg/dL — ABNORMAL HIGH (ref 0.40–1.50)
GFR: 19.69 mL/min — ABNORMAL LOW (ref >60.00)
Glucose, Bld: 154 mg/dL — ABNORMAL HIGH (ref 70–99)
Potassium: 5.1 mEq/L (ref 3.5–5.1)
Sodium: 131 mEq/L — ABNORMAL LOW (ref 135–145)
Total Bilirubin: 0.4 mg/dL (ref 0.2–1.2)
Total Protein: 7.3 g/dL (ref 6.0–8.3)

## 2019-03-25 NOTE — Telephone Encounter (Signed)
Copied from [place] [id number]. Topic: General - Inquiry >> Mar 25, 2019  8:22 AM Virl Axe D wrote: Reason for CRM: Pt's daughter [name] is asking if [name] could put in stat lab orders today for CBC and Complete Metabolic Panel for pt. Pt is more symptomatic today. Please advise. Please reach out to daughter with questions or to let her know if orders are put in. WX#[phone]

## 2019-03-25 NOTE — Telephone Encounter (Signed)
I put in orders and sent e-mail to her; if you want to call that would be good too.

## 2019-03-25 NOTE — Telephone Encounter (Signed)
Patient's daughter is requesting to speak with [name] about having the number for Dr.Patel. The number is [phone] and [name] has spoken with [name]

## 2019-03-26 ENCOUNTER — Other Ambulatory Visit (HOSPITAL_COMMUNITY): Payer: Self-pay | Admitting: *Deleted

## 2019-03-27 ENCOUNTER — Ambulatory Visit (HOSPITAL_COMMUNITY)
Admission: RE | Admit: 2019-03-27 | Discharge: 2019-03-27 | Disposition: A | Discharge status: Home / Self Care | Payer: Medicare Other | Source: Ambulatory Visit | Attending: Nephrology | Admitting: Nephrology

## 2019-03-27 ENCOUNTER — Other Ambulatory Visit: Payer: Self-pay

## 2019-03-27 VITALS — BP 171/62 | HR 64 | Temp 98.6°F | Resp 20 | Ht 67.0 in | Wt 139.0 lb

## 2019-03-27 DIAGNOSIS — N184 Chronic kidney disease, stage 4 (severe): Secondary | ICD-10-CM | POA: Insufficient documentation

## 2019-03-27 DIAGNOSIS — D631 Anemia in chronic kidney disease: Secondary | ICD-10-CM | POA: Insufficient documentation

## 2019-03-27 DIAGNOSIS — D649 Anemia, unspecified: Secondary | ICD-10-CM

## 2019-03-27 LAB — IRON AND TIBC
Iron: 164 ug/dL (ref 45–182)
Saturation Ratios: 56 % — ABNORMAL HIGH (ref 17.9–39.5)
TIBC: 295 ug/dL (ref 250–450)
UIBC: 131 ug/dL

## 2019-03-27 LAB — POCT HEMOGLOBIN-HEMACUE: Hemoglobin: 10.3 g/dL — ABNORMAL LOW (ref 13.0–17.0)

## 2019-03-27 LAB — FERRITIN: Ferritin: 440 ng/mL — ABNORMAL HIGH (ref 24–336)

## 2019-03-27 MED ORDER — LACTATED RINGERS IV SOLN
Freq: Once | INTRAVENOUS | Status: AC
  Administered 2019-03-27: 08:00:00 via INTRAVENOUS

## 2019-03-27 MED ORDER — EPOETIN ALFA 10000 UNIT/ML IJ SOLN
10000.0000 [IU] | INTRAMUSCULAR | Status: DC
  Administered 2019-03-27: 10000 [IU] via SUBCUTANEOUS

## 2019-03-27 MED ORDER — EPOETIN ALFA 10000 UNIT/ML IJ SOLN
INTRAMUSCULAR | Status: AC
  Filled 2019-03-27: qty 1

## 2019-03-28 ENCOUNTER — Encounter: Payer: Self-pay | Admitting: Family

## 2019-03-29 ENCOUNTER — Other Ambulatory Visit: Payer: Self-pay | Admitting: Family

## 2019-03-29 ENCOUNTER — Other Ambulatory Visit (INDEPENDENT_AMBULATORY_CARE_PROVIDER_SITE_OTHER): Payer: Medicare Other

## 2019-03-29 ENCOUNTER — Ambulatory Visit: Payer: Medicare Other

## 2019-03-29 DIAGNOSIS — C221 Intrahepatic bile duct carcinoma: Secondary | ICD-10-CM

## 2019-03-29 LAB — COMPREHENSIVE METABOLIC PANEL
ALT: 19 U/L (ref 0–53)
AST: 18 U/L (ref 0–37)
Albumin: 3.8 g/dL (ref 3.5–5.2)
Alkaline Phosphatase: 82 U/L (ref 39–117)
BUN: 56 mg/dL — ABNORMAL HIGH (ref 6–23)
CO2: 24 mEq/L (ref 19–32)
Calcium: 8.9 mg/dL (ref 8.4–10.5)
Chloride: 101 mEq/L (ref 96–112)
Creatinine, Ser: 2.58 mg/dL — ABNORMAL HIGH (ref 0.40–1.50)
GFR: 23.44 mL/min — ABNORMAL LOW (ref >60.00)
Glucose, Bld: 129 mg/dL — ABNORMAL HIGH (ref 70–99)
Potassium: 4.5 mEq/L (ref 3.5–5.1)
Sodium: 135 mEq/L (ref 135–145)
Total Bilirubin: 0.3 mg/dL (ref 0.2–1.2)
Total Protein: 7 g/dL (ref 6.0–8.3)

## 2019-04-01 ENCOUNTER — Encounter (HOSPITAL_COMMUNITY): Payer: Medicare Other

## 2019-04-02 ENCOUNTER — Other Ambulatory Visit: Payer: Self-pay

## 2019-04-03 ENCOUNTER — Ambulatory Visit: Payer: Medicare Other | Admitting: Family

## 2019-04-03 ENCOUNTER — Ambulatory Visit (HOSPITAL_COMMUNITY)
Admission: RE | Admit: 2019-04-03 | Discharge: 2019-04-03 | Disposition: A | Discharge status: Home / Self Care | Payer: Medicare Other | Source: Ambulatory Visit | Attending: Nephrology | Admitting: Nephrology

## 2019-04-03 VITALS — BP 174/58 | HR 63 | Temp 96.7°F | Resp 20

## 2019-04-03 DIAGNOSIS — D649 Anemia, unspecified: Secondary | ICD-10-CM

## 2019-04-03 DIAGNOSIS — D631 Anemia in chronic kidney disease: Secondary | ICD-10-CM | POA: Diagnosis not present

## 2019-04-03 DIAGNOSIS — N184 Chronic kidney disease, stage 4 (severe): Secondary | ICD-10-CM | POA: Insufficient documentation

## 2019-04-03 LAB — POCT HEMOGLOBIN-HEMACUE: Hemoglobin: 9.8 g/dL — ABNORMAL LOW (ref 13.0–17.0)

## 2019-04-03 MED ORDER — EPOETIN ALFA 10000 UNIT/ML IJ SOLN
INTRAMUSCULAR | Status: AC
  Administered 2019-04-03: 10000 [IU] via SUBCUTANEOUS
  Filled 2019-04-03: qty 1

## 2019-04-03 MED ORDER — EPOETIN ALFA 10000 UNIT/ML IJ SOLN: 10000.0000 [IU] | INTRAMUSCULAR | Status: DC

## 2019-04-10 ENCOUNTER — Other Ambulatory Visit: Payer: Self-pay

## 2019-04-10 ENCOUNTER — Ambulatory Visit (HOSPITAL_COMMUNITY)
Admission: RE | Admit: 2019-04-10 | Discharge: 2019-04-10 | Disposition: A | Discharge status: Home / Self Care | Payer: Medicare Other | Source: Ambulatory Visit | Attending: Nephrology | Admitting: Nephrology

## 2019-04-10 VITALS — BP 172/63 | HR 62 | Temp 96.5°F | Resp 20

## 2019-04-10 DIAGNOSIS — D631 Anemia in chronic kidney disease: Secondary | ICD-10-CM | POA: Insufficient documentation

## 2019-04-10 DIAGNOSIS — D649 Anemia, unspecified: Secondary | ICD-10-CM

## 2019-04-10 DIAGNOSIS — N184 Chronic kidney disease, stage 4 (severe): Secondary | ICD-10-CM | POA: Diagnosis not present

## 2019-04-10 LAB — POCT HEMOGLOBIN-HEMACUE: Hemoglobin: 10.4 g/dL — ABNORMAL LOW (ref 13.0–17.0)

## 2019-04-10 MED ORDER — EPOETIN ALFA 10000 UNIT/ML IJ SOLN
INTRAMUSCULAR | Status: AC
  Administered 2019-04-10: 10000 [IU] via SUBCUTANEOUS
  Filled 2019-04-10: qty 1

## 2019-04-10 MED ORDER — EPOETIN ALFA 10000 UNIT/ML IJ SOLN
10000.0000 [IU] | INTRAMUSCULAR | Status: DC
  Administered 2019-04-10: 09:00:00 10000 [IU] via SUBCUTANEOUS

## 2019-04-17 ENCOUNTER — Ambulatory Visit (HOSPITAL_COMMUNITY)
Admission: RE | Admit: 2019-04-17 | Discharge: 2019-04-17 | Disposition: A | Discharge status: Home / Self Care | Payer: Medicare Other | Source: Ambulatory Visit | Attending: Nephrology | Admitting: Nephrology

## 2019-04-17 ENCOUNTER — Other Ambulatory Visit: Payer: Self-pay

## 2019-04-17 VITALS — BP 176/68 | HR 70 | Temp 97.3°F | Resp 20

## 2019-04-17 DIAGNOSIS — D631 Anemia in chronic kidney disease: Secondary | ICD-10-CM | POA: Diagnosis not present

## 2019-04-17 DIAGNOSIS — N184 Chronic kidney disease, stage 4 (severe): Secondary | ICD-10-CM | POA: Insufficient documentation

## 2019-04-17 DIAGNOSIS — D649 Anemia, unspecified: Secondary | ICD-10-CM

## 2019-04-17 LAB — POCT HEMOGLOBIN-HEMACUE: Hemoglobin: 11.9 g/dL — ABNORMAL LOW (ref 13.0–17.0)

## 2019-04-17 MED ORDER — EPOETIN ALFA 10000 UNIT/ML IJ SOLN
10000.0000 [IU] | INTRAMUSCULAR | Status: DC
  Administered 2019-04-17: 09:00:00 10000 [IU] via SUBCUTANEOUS

## 2019-04-17 MED ORDER — EPOETIN ALFA 10000 UNIT/ML IJ SOLN
INTRAMUSCULAR | Status: AC
  Administered 2019-04-17: 10000 [IU] via SUBCUTANEOUS
  Filled 2019-04-17: qty 1

## 2019-04-23 ENCOUNTER — Other Ambulatory Visit: Payer: Self-pay

## 2019-04-24 ENCOUNTER — Ambulatory Visit (HOSPITAL_COMMUNITY)
Admission: RE | Admit: 2019-04-24 | Discharge: 2019-04-24 | Disposition: A | Discharge status: Home / Self Care | Payer: Medicare Other | Source: Ambulatory Visit | Attending: Nephrology | Admitting: Nephrology

## 2019-04-24 ENCOUNTER — Encounter (HOSPITAL_COMMUNITY): Payer: Medicare Other

## 2019-04-24 DIAGNOSIS — D631 Anemia in chronic kidney disease: Secondary | ICD-10-CM | POA: Insufficient documentation

## 2019-04-24 DIAGNOSIS — D649 Anemia, unspecified: Secondary | ICD-10-CM

## 2019-04-24 DIAGNOSIS — N184 Chronic kidney disease, stage 4 (severe): Secondary | ICD-10-CM | POA: Diagnosis present

## 2019-04-24 LAB — IRON AND TIBC
Iron: 50 ug/dL (ref 45–182)
Saturation Ratios: 17 % — ABNORMAL LOW (ref 17.9–39.5)
TIBC: 293 ug/dL (ref 250–450)
UIBC: 243 ug/dL

## 2019-04-24 LAB — FERRITIN: Ferritin: 216 ng/mL (ref 24–336)

## 2019-04-24 LAB — POCT HEMOGLOBIN-HEMACUE: Hemoglobin: 11.9 g/dL — ABNORMAL LOW (ref 13.0–17.0)

## 2019-04-24 MED ORDER — EPOETIN ALFA 10000 UNIT/ML IJ SOLN
10000.0000 [IU] | INTRAMUSCULAR | Status: DC
  Administered 2019-04-24: 10000 [IU] via SUBCUTANEOUS

## 2019-04-24 MED ORDER — EPOETIN ALFA 10000 UNIT/ML IJ SOLN
INTRAMUSCULAR | Status: AC
  Filled 2019-04-24: qty 1

## 2019-05-01 ENCOUNTER — Ambulatory Visit (HOSPITAL_COMMUNITY)
Admission: RE | Admit: 2019-05-01 | Discharge: 2019-05-01 | Disposition: A | Discharge status: Home / Self Care | Payer: Medicare Other | Source: Ambulatory Visit | Attending: Nephrology | Admitting: Nephrology

## 2019-05-01 ENCOUNTER — Other Ambulatory Visit: Payer: Self-pay

## 2019-05-01 VITALS — BP 168/62 | HR 63 | Temp 98.7°F | Resp 20

## 2019-05-01 DIAGNOSIS — D649 Anemia, unspecified: Secondary | ICD-10-CM | POA: Insufficient documentation

## 2019-05-01 LAB — POCT HEMOGLOBIN-HEMACUE: Hemoglobin: 12.2 g/dL — ABNORMAL LOW (ref 13.0–17.0)

## 2019-05-01 MED ORDER — EPOETIN ALFA 10000 UNIT/ML IJ SOLN: 10000.0000 [IU] | INTRAMUSCULAR | Status: DC

## 2019-05-08 ENCOUNTER — Encounter (HOSPITAL_COMMUNITY): Payer: Medicare Other

## 2019-05-16 ENCOUNTER — Ambulatory Visit: Payer: Medicare Other

## 2019-05-22 ENCOUNTER — Encounter: Payer: Self-pay | Admitting: Family

## 2019-05-22 NOTE — Progress Notes (Addendum)
Subjective:   Gerald Jacobs is a 85 y.o. male who presents for Medicare Annual/Subsequent preventive examination.  Review of Systems:   Cardiac Risk Factors include: advanced age (>28men, >38 women);diabetes mellitus;male gender;hypertension  Sleep patterns: gets up 1-2 times nightly to void and sleeps 7 hours nightly.    Home Safety/Smoke Alarms: Feels safe in home. Smoke alarms in place.  Living environment; residence and Firearm Safety: 1-story house/ trailer, equipment: Radio producer, Type: [place] and Walkers, Type: Conservation officer, nature. Lives with wife, no needs for DME, good support system Seat Belt Safety/Bike Helmet: Wears seat belt.   PSA-  Lab Results  Component Value Date   PSA 0.01 (L) 02/08/2017       Objective:    Vitals: BP 138/64   Pulse 75   Resp 17   Ht 5\' 6"  (1.676 m)   Wt 138 lb (62.6 kg)   SpO2 99%   BMI 22.27 kg/m   Body mass index is 22.27 kg/m.  Advanced Directives 05/23/2019 10/03/2018 08/22/2018 02/26/2018 06/15/2017 02/04/2014 01/28/2014  Does Patient Have a Medical Advance Directive? Yes Yes Yes Yes No Patient has advance directive, copy in chart Patient has advance directive, copy not in chart  Type of Advance Directive [place];Living will Living will [place];Living will [place];Living will - Healthcare Power of [place];Living will  Does patient want to make changes to medical advance directive? - No - Patient declined - - - No change requested No change requested  Copy of [place] in Chart? No - copy requested - - No - copy requested - Copy requested from other (Comment) Copy requested from family  Would patient like information on creating a medical advance directive? - - - - No - Patient declined - -  Pre-existing out of facility DNR order (yellow form or pink MOST form) - - - - - No -    Tobacco Social History   Tobacco Use  Smoking  Status Former Smoker  . Packs/day: 1.00  . Years: 45.00  . Pack years: 45.00  . Types: Cigarettes  . Quit date: 09/07/1979  . Years since quitting: 39.7  Smokeless Tobacco Never Used     Counseling given: Not Answered  Past Medical History:  Diagnosis Date  . Bilateral shoulder pain 07/28/2016  . Cancer [facility])    prostate  . Chest pain 10/27/2016  . Chronic anemia    With normal iron studies and normal serum protein electrophoresis  . Coronary artery disease    Mild  . Cough 10/27/2016  . Diabetes mellitus    Adult onset  . Essential hypertension   . GERD (gastroesophageal reflux disease)    occ tums  . History of gout   . Hyperbilirubinemia 06/15/2017  . Hyponatremia 06/15/2017  . Intermittent palpitations 07/23/2014  . Liver mass   . Mass of bile duct 06/27/2017  . Mild renal insufficiency   . Nocturia    x2  . Normocytic anemia 06/23/2016  . Osteoarthritis   . Sciatica of right side 07/28/2016  . Statin myopathy 07/28/2016  . Weight loss 06/15/2017   Past Surgical History:  Procedure Laterality Date  . CHOLECYSTECTOMY  02/04/2014   DR Zella Richer  . CHOLECYSTECTOMY N/A 02/04/2014   Procedure: LAPAROSCOPIC CHOLECYSTECTOMY with intraoperative cholangiogram;  Surgeon: Odis Hollingshead, MD;  Location: [place];  Service: General;  Laterality: N/A;  . ERCP N/A 10/15/2013   Procedure: ENDOSCOPIC RETROGRADE  CHOLANGIOPANCREATOGRAPHY (ERCP);  Surgeon: Jeryl Columbia, MD;  Location: [facility];  Service: Endoscopy;  Laterality: N/A;  . KNEE SURGERY Right 1979  . melanoma surgery  1970's   on scalp  . PROSTATE SURGERY  1993   Prostate Cancer  . SPHINCTEROTOMY  10/15/2013   Procedure: SPHINCTEROTOMY;  Surgeon: Jeryl Columbia, MD;  Location: [facility];  Service: Endoscopy;;   Family History  Problem Relation Age of Onset  . Stroke Father   . Stroke Mother   . Stroke Sister   . Stroke Brother   . Stroke Brother   . Stroke Brother    Social History   Socioeconomic History   . Marital status: Married    Spouse name: Not on file  . Number of children: 2  . Years of education: 24  . Highest education level: Not on file  Occupational History  . Occupation: retired  Scientific laboratory technician  . Financial resource strain: Not hard at all  . Food insecurity    Worry: Never true    Inability: Never true  . Transportation needs    Medical: No    Non-medical: No  Tobacco Use  . Smoking status: Former Smoker    Packs/day: 1.00    Years: 45.00    Pack years: 45.00    Types: Cigarettes    Quit date: 09/07/1979    Years since quitting: 39.7  . Smokeless tobacco: Never Used  Substance and Sexual Activity  . Alcohol use: Yes    Comment: beer rarely  . Drug use: No  . Sexual activity: Not Currently  Lifestyle  . Physical activity    Days per week: 0 days    Minutes per session: 0 min  . Stress: Not at all  Relationships  . Social connections    Talks on phone: More than three times a week    Gets together: More than three times a week    Attends religious service: More than 4 times per year    Active member of club or organization: Yes    Attends meetings of clubs or organizations: More than 4 times per year    Relationship status: Married  Other Topics Concern  . Not on file  Social History Narrative        Outpatient Encounter Medications as of 05/23/2019  Medication Sig  . allopurinol (ZYLOPRIM) 100 MG tablet TAKE 1 TABLET BY MOUTH EVERY DAY  . amLODipine (NORVASC) 5 MG tablet TAKE 1 TABLET BY MOUTH EVERY DAY  . apixaban (ELIQUIS) 2.5 MG TABS tablet Take 1 tablet (2.5 mg total) by mouth 2 (two) times daily.  . Biotin 1000 MCG tablet Take 1,000 mcg by mouth daily.  . cholecalciferol (VITAMIN D) 1000 units tablet Take 2,000 Units by mouth daily.   . diphenhydramine-acetaminophen (TYLENOL PM) 25-500 MG TABS tablet Take 1 tablet by mouth at bedtime as needed (pain).  Marland Kitchen epoetin alfa (EPOGEN,PROCRIT) 74081 UNIT/ML injection Inject 10,000 Units into the vein once  a week.  Marland Kitchen glucose blood (ONE TOUCH ULTRA TEST) test strip USE ONE STRIP PER TEST. CHECK BLOOD SUGARS 1-4 TIMES PER DAY AS INSTRUCTED.  Marland Kitchen metoprolol succinate (TOPROL-XL) 50 MG 24 hr tablet Take 1 tablet (50 mg total) by mouth 2 (two) times daily. Take with or immediately following a meal.  . ondansetron (ZOFRAN) 8 MG tablet Take by mouth every 8 (eight) hours as needed for nausea or vomiting.  Glory Rosebush DELICA LANCETS 44Y MISC TEST BLOOD SUGARS 1-4 TIMES A  DAY AS INSTRUCTED. DX CODE: E11.9  . promethazine (PHENERGAN) 12.5 MG tablet Take 12.5 mg by mouth every 6 (six) hours as needed for nausea or vomiting.  . protein supplement shake (PREMIER PROTEIN) LIQD Take 2 oz by mouth daily.  . sitaGLIPtin (JANUVIA) 50 MG tablet Take 1 tablet (50 mg total) by mouth daily.  . sodium chloride (SALINE MIST) 0.65 % nasal spray Place into the nose.  . triamcinolone (KENALOG) 0.025 % cream Apply 1 application topically 2 (two) times daily as needed (itching).   . triamcinolone (KENALOG) 0.1 % paste Use as directed 1 application in the mouth or throat 2 (two) times daily.   . NEOMYCIN-POLYMYXIN-HYDROCORTISONE (CORTISPORIN) 1 % SOLN OTIC solution PLACE 3 DROPS INTO THE LEFT EAR 3 (THREE) TIMES DAILY. (Patient not taking: Reported on 05/23/2019)  . pantoprazole (PROTONIX) 40 MG tablet Take 40 mg by mouth daily as needed (indigestion).   . [DISCONTINUED] feeding supplement, GLUCERNA SHAKE, (GLUCERNA SHAKE) LIQD Take 237 mLs by mouth 2 (two) times daily between meals. (Patient not taking: Reported on 05/23/2019)   Facility-Administered Encounter Medications as of 05/23/2019  Medication  . epoetin alfa (EPOGEN) injection 10,000 Units    Activities of Daily Living In your present state of health, do you have any difficulty performing the following activities: 05/23/2019  Hearing? Y  Vision? N  Difficulty concentrating or making decisions? N  Walking or climbing stairs? N  Dressing or bathing? N  Doing errands,  shopping? N  Preparing Food and eating ? N  Using the Toilet? N  In the past six months, have you accidently leaked urine? N  Do you have problems with loss of bowel control? N  Managing your Medications? N  Managing your Finances? N  Housekeeping or managing your Housekeeping? N  Some recent data might be hidden    Patient Care Team: Marrian Salvage, FNP as PCP - General (Internal Medicine) Nahser, Wonda Cheng, MD as PCP - Cardiology (Cardiology)   Assessment:   This is a routine wellness examination for [place]. Physical assessment deferred to PCP.  Exercise Activities and Dietary recommendations Current Exercise Habits: The patient does not participate in regular exercise at present, Exercise limited by: orthopedic condition(s)  Diet (meal preparation, eat out, water intake, caffeinated beverages, dairy products, fruits and vegetables): in general, a "healthy" diet    Reports poor appetite at times.   Supplements with premier. Encouraged patient to [facility] daily water and healthy fluid intake.  Goals    . Patient Stated     Stay as healthy and as independent as possible.        Fall Risk Fall Risk  02/26/2018 01/24/2017  Falls in the past year? No Yes  Number falls in past yr: - 1  Injury with Fall? - Yes    Depression Screen PHQ 2/9 Scores 02/26/2018 01/24/2017 01/18/2016  PHQ - 2 Score 2 0 2  PHQ- 9 Score 5 - 5    Cognitive Function MMSE - Mini Mental State Exam 02/26/2018  Orientation to time 5  Orientation to Place 5  Registration 3  Attention/ Calculation 5  Recall 3  Language- name 2 objects 2  Language- repeat 1  Language- follow 3 step command 3  Language- read & follow direction 1  Write a sentence 1  Copy design 1  Total score 30       Ad8 score reviewed for issues:  Issues making decisions: no  Less interest in hobbies / activities: no  Repeats  questions, stories (family complaining): no  Trouble using ordinary gadgets (microwave,  computer, phone):no  Forgets the month or year: no  Mismanaging finances: no  Remembering appts: no  Daily problems with thinking and/or memory: no Ad8 score is= 0  Immunization History  Administered Date(s) Administered  . Influenza, High Dose Seasonal PF 09/21/2017, 09/05/2018  . Influenza,inj,Quad PF,6+ Mos 09/30/2015  . Influenza-Unspecified 08/28/2014  . Pneumococcal Conjugate-13 01/18/2016  . Pneumococcal Polysaccharide-23 02/05/2014   Screening Tests Health Maintenance  Topic Date Due  . TETANUS/TDAP  10/21/1944  . FOOT EXAM  05/09/2018  . OPHTHALMOLOGY EXAM  09/28/2018  . URINE MICROALBUMIN  05/22/2019  . HEMOGLOBIN A1C  06/03/2019  . INFLUENZA VACCINE  06/29/2019  . PNA vac Low Risk Adult  Completed       Plan:    Reviewed health maintenance screenings with patient today and relevant education, vaccines, and/or referrals were provided.   Continue to eat heart healthy diet (full of fruits, vegetables, whole grains, lean protein, water--limit salt, fat, and sugar intake) and increase physical activity as tolerated.  Continue doing brain stimulating activities (puzzles, reading, adult coloring books, staying active) to keep memory sharp.   I have personally reviewed and noted the following in the patient's chart:   . Medical and social history . Use of alcohol, tobacco or illicit drugs  . Current medications and supplements . Functional ability and status . Nutritional status . Physical activity . Advanced directives . List of other physicians . Vitals . Screenings to include cognitive, depression, and falls . Referrals and appointments  In addition, I have reviewed and discussed with patient certain preventive protocols, quality metrics, and best practice recommendations. A written personalized care plan for preventive services as well as general preventive health recommendations were provided to patient.     Michiel Cowboy, RN  05/23/2019  Medical  screening examination/treatment/procedure(s) were performed by non-physician practitioner and as supervising physician I was immediately available for consultation/collaboration. I agree with above. Cathlean Cower, MD

## 2019-05-23 ENCOUNTER — Other Ambulatory Visit (INDEPENDENT_AMBULATORY_CARE_PROVIDER_SITE_OTHER): Payer: Medicare Other

## 2019-05-23 ENCOUNTER — Ambulatory Visit (HOSPITAL_COMMUNITY)
Admission: RE | Admit: 2019-05-23 | Discharge: 2019-05-23 | Disposition: A | Discharge status: Home / Self Care | Payer: Medicare Other | Source: Ambulatory Visit | Attending: Nephrology | Admitting: Nephrology

## 2019-05-23 ENCOUNTER — Other Ambulatory Visit: Payer: Self-pay

## 2019-05-23 ENCOUNTER — Ambulatory Visit (INDEPENDENT_AMBULATORY_CARE_PROVIDER_SITE_OTHER): Payer: Medicare Other | Admitting: *Deleted

## 2019-05-23 VITALS — BP 176/60 | HR 65 | Temp 96.3°F | Resp 20

## 2019-05-23 VITALS — BP 138/64 | HR 75 | Resp 17 | Ht 66.0 in | Wt 138.0 lb

## 2019-05-23 DIAGNOSIS — D649 Anemia, unspecified: Secondary | ICD-10-CM | POA: Diagnosis not present

## 2019-05-23 DIAGNOSIS — Z0000 Encounter for general adult medical examination without abnormal findings: Secondary | ICD-10-CM

## 2019-05-23 DIAGNOSIS — E1122 Type 2 diabetes mellitus with diabetic chronic kidney disease: Secondary | ICD-10-CM

## 2019-05-23 DIAGNOSIS — N184 Chronic kidney disease, stage 4 (severe): Secondary | ICD-10-CM

## 2019-05-23 LAB — COMPREHENSIVE METABOLIC PANEL
ALT: 22 U/L (ref 0–53)
AST: 22 U/L (ref 0–37)
Albumin: 3.8 g/dL (ref 3.5–5.2)
Alkaline Phosphatase: 73 U/L (ref 39–117)
BUN: 56 mg/dL — ABNORMAL HIGH (ref 6–23)
CO2: 24 mEq/L (ref 19–32)
Calcium: 8.6 mg/dL (ref 8.4–10.5)
Chloride: 105 mEq/L (ref 96–112)
Creatinine, Ser: 2.63 mg/dL — ABNORMAL HIGH (ref 0.40–1.50)
GFR: 22.92 mL/min — ABNORMAL LOW (ref >60.00)
Glucose, Bld: 129 mg/dL — ABNORMAL HIGH (ref 70–99)
Potassium: 4.8 mEq/L (ref 3.5–5.1)
Sodium: 137 mEq/L (ref 135–145)
Total Bilirubin: 0.3 mg/dL (ref 0.2–1.2)
Total Protein: 7.1 g/dL (ref 6.0–8.3)

## 2019-05-23 LAB — FERRITIN: Ferritin: 265 ng/mL (ref 24–336)

## 2019-05-23 LAB — IRON AND TIBC
Iron: 122 ug/dL (ref 45–182)
Saturation Ratios: 43 % — ABNORMAL HIGH (ref 17.9–39.5)
TIBC: 283 ug/dL (ref 250–450)
UIBC: 161 ug/dL

## 2019-05-23 LAB — POCT HEMOGLOBIN-HEMACUE: Hemoglobin: 12 g/dL — ABNORMAL LOW (ref 13.0–17.0)

## 2019-05-23 MED ORDER — EPOETIN ALFA 10000 UNIT/ML IJ SOLN: 10000.0000 [IU] | INTRAMUSCULAR | Status: DC

## 2019-05-23 NOTE — Patient Instructions (Addendum)
Continue to eat heart healthy diet (full of fruits, vegetables, whole grains, lean protein, water--limit salt, fat, and sugar intake) and increase physical activity as tolerated.   Gerald Jacobs , Thank you for taking time to come for your Medicare Wellness Visit. I appreciate your ongoing commitment to your health goals. Please review the following plan we discussed and let me know if I can assist you in the future.   These are the goals we discussed: Goals    . Patient Stated     Stay as healthy and as independent as possible.        This is a list of the screening recommended for you and due dates:  Health Maintenance  Topic Date Due  . Tetanus Vaccine  10/21/1944  . Complete foot exam   05/09/2018  . Eye exam for diabetics  09/28/2018  . Urine Protein Check  05/22/2019  . Hemoglobin A1C  06/03/2019  . Flu Shot  06/29/2019  . Pneumonia vaccines  Completed    Preventive Care 38 Years and Older, Male Preventive care refers to lifestyle choices and visits with your health care provider that can promote health and wellness. What does preventive care include?   A yearly physical exam. This is also called an annual well check.  Dental exams once or twice a year.  Routine eye exams. Ask your health care provider how often you should have your eyes checked.  Personal lifestyle choices, including: ? Daily care of your teeth and gums. ? Regular physical activity. ? Eating a healthy diet. ? Avoiding tobacco and drug use. ? Limiting alcohol use. ? Practicing safe sex. ? Taking low doses of aspirin every day. ? Taking vitamin and mineral supplements as recommended by your health care provider. What happens during an annual well check? The services and screenings done by your health care provider during your annual well check will depend on your age, overall health, lifestyle risk factors, and family history of disease. Counseling Your health care provider may ask you questions  about your:  Alcohol use.  Tobacco use.  Drug use.  Emotional well-being.  Home and relationship well-being.  Sexual activity.  Eating habits.  History of falls.  Memory and ability to understand (cognition).  Work and work Statistician. Screening You may have the following tests or measurements:  Height, weight, and BMI.  Blood pressure.  Lipid and cholesterol levels. These may be checked every 5 years, or more frequently if you are over 83 years old.  Skin check.  Lung cancer screening. You may have this screening every year starting at age 83 if you have a 30-pack-year history of smoking and currently smoke or have quit within the past 15 years.  Colorectal cancer screening. All adults should have this screening starting at age 83 and continuing until age 83. You will have tests every 1-10 years, depending on your results and the type of screening test. People at increased risk should start screening at an earlier age. Screening tests may include: ? Guaiac-based fecal occult blood testing. ? Fecal immunochemical test (FIT). ? Stool DNA test. ? Virtual colonoscopy. ? Sigmoidoscopy. During this test, a flexible tube with a tiny camera (sigmoidoscope) is used to examine your rectum and lower colon. The sigmoidoscope is inserted through your anus into your rectum and lower colon. ? Colonoscopy. During this test, a long, thin, flexible tube with a tiny camera (colonoscope) is used to examine your entire colon and rectum.  Prostate cancer screening. Recommendations will  vary depending on your family history and other risks.  Hepatitis C blood test.  Hepatitis B blood test.  Sexually transmitted disease (STD) testing.  Diabetes screening. This is done by checking your blood sugar (glucose) after you have not eaten for a while (fasting). You may have this done every 1-3 years.  Abdominal aortic aneurysm (AAA) screening. You may need this if you are a current or former  smoker.  Osteoporosis. You may be screened starting at age 83 if you are at high risk. Talk with your health care provider about your test results, treatment options, and if necessary, the need for more tests. Vaccines Your health care provider may recommend certain vaccines, such as:  Influenza vaccine. This is recommended every year.  Tetanus, diphtheria, and acellular pertussis (Tdap, Td) vaccine. You may need a Td booster every 10 years.  Varicella vaccine. You may need this if you have not been vaccinated.  Zoster vaccine. You may need this after age 83.  Measles, mumps, and rubella (MMR) vaccine. You may need at least one dose of MMR if you were born in 1957 or later. You may also need a second dose.  Pneumococcal 13-valent conjugate (PCV13) vaccine. One dose is recommended after age 83.  Pneumococcal polysaccharide (PPSV23) vaccine. One dose is recommended after age 83.  Meningococcal vaccine. You may need this if you have certain conditions.  Hepatitis A vaccine. You may need this if you have certain conditions or if you travel or work in places where you may be exposed to hepatitis A.  Hepatitis B vaccine. You may need this if you have certain conditions or if you travel or work in places where you may be exposed to hepatitis B.  Haemophilus influenzae type b (Hib) vaccine. You may need this if you have certain risk factors. Talk to your health care provider about which screenings and vaccines you need and how often you need them. This information is not intended to replace advice given to you by your health care provider. Make sure you discuss any questions you have with your health care provider. Document Released: 12/11/2015 Document Revised: 01/04/2018 Document Reviewed: 09/15/2015 Elsevier Interactive Patient Education  2019 [employer].

## 2019-05-24 ENCOUNTER — Encounter: Payer: Self-pay | Admitting: Family

## 2019-05-27 ENCOUNTER — Other Ambulatory Visit: Payer: Self-pay | Admitting: Family

## 2019-05-27 MED ORDER — AMLODIPINE BESYLATE 5 MG PO TABS: 5.0000 mg | ORAL_TABLET | Freq: Every day | ORAL | 1 refills | Status: DC

## 2019-05-29 ENCOUNTER — Other Ambulatory Visit: Payer: Self-pay | Admitting: *Deleted

## 2019-05-29 MED ORDER — ONETOUCH DELICA LANCETS 33G MISC: 3 refills | Status: AC

## 2019-05-30 ENCOUNTER — Encounter (HOSPITAL_COMMUNITY): Payer: Medicare Other

## 2019-06-06 ENCOUNTER — Other Ambulatory Visit: Payer: Self-pay

## 2019-06-06 ENCOUNTER — Ambulatory Visit (HOSPITAL_COMMUNITY)
Admission: RE | Admit: 2019-06-06 | Discharge: 2019-06-06 | Disposition: A | Discharge status: Home / Self Care | Payer: Medicare Other | Source: Ambulatory Visit | Attending: Nephrology | Admitting: Nephrology

## 2019-06-06 VITALS — BP 140/61 | HR 62 | Temp 96.3°F | Resp 20

## 2019-06-06 DIAGNOSIS — N189 Chronic kidney disease, unspecified: Secondary | ICD-10-CM | POA: Insufficient documentation

## 2019-06-06 DIAGNOSIS — D631 Anemia in chronic kidney disease: Secondary | ICD-10-CM | POA: Insufficient documentation

## 2019-06-06 DIAGNOSIS — D649 Anemia, unspecified: Secondary | ICD-10-CM

## 2019-06-06 LAB — POCT HEMOGLOBIN-HEMACUE: Hemoglobin: 10.8 g/dL — ABNORMAL LOW (ref 13.0–17.0)

## 2019-06-06 MED ORDER — EPOETIN ALFA 10000 UNIT/ML IJ SOLN
10000.0000 [IU] | INTRAMUSCULAR | Status: DC
  Administered 2019-06-06: 10000 [IU] via SUBCUTANEOUS

## 2019-06-06 MED ORDER — EPOETIN ALFA 10000 UNIT/ML IJ SOLN
INTRAMUSCULAR | Status: AC
  Filled 2019-06-06: qty 1

## 2019-06-13 ENCOUNTER — Encounter (HOSPITAL_COMMUNITY)
Admission: RE | Admit: 2019-06-13 | Discharge: 2019-06-13 | Disposition: A | Discharge status: Home / Self Care | Payer: Medicare Other | Source: Ambulatory Visit | Attending: Nephrology | Admitting: Nephrology

## 2019-06-13 ENCOUNTER — Other Ambulatory Visit: Payer: Self-pay

## 2019-06-13 VITALS — BP 158/63 | HR 69 | Temp 97.0°F | Resp 20

## 2019-06-13 DIAGNOSIS — D649 Anemia, unspecified: Secondary | ICD-10-CM

## 2019-06-13 DIAGNOSIS — D631 Anemia in chronic kidney disease: Secondary | ICD-10-CM | POA: Insufficient documentation

## 2019-06-13 DIAGNOSIS — N189 Chronic kidney disease, unspecified: Secondary | ICD-10-CM | POA: Insufficient documentation

## 2019-06-13 LAB — POCT HEMOGLOBIN-HEMACUE: Hemoglobin: 10.3 g/dL — ABNORMAL LOW (ref 13.0–17.0)

## 2019-06-13 MED ORDER — EPOETIN ALFA 10000 UNIT/ML IJ SOLN
10000.0000 [IU] | INTRAMUSCULAR | Status: DC
  Administered 2019-06-13: 10000 [IU] via SUBCUTANEOUS

## 2019-06-13 MED ORDER — EPOETIN ALFA 10000 UNIT/ML IJ SOLN
INTRAMUSCULAR | Status: AC
  Filled 2019-06-13: qty 1

## 2019-06-20 ENCOUNTER — Other Ambulatory Visit: Payer: Self-pay

## 2019-06-20 ENCOUNTER — Ambulatory Visit (HOSPITAL_COMMUNITY)
Admission: RE | Admit: 2019-06-20 | Discharge: 2019-06-20 | Disposition: A | Discharge status: Home / Self Care | Payer: Medicare Other | Source: Ambulatory Visit | Attending: Nephrology | Admitting: Nephrology

## 2019-06-20 VITALS — BP 179/56 | HR 65 | Temp 96.8°F | Resp 20

## 2019-06-20 DIAGNOSIS — D649 Anemia, unspecified: Secondary | ICD-10-CM

## 2019-06-20 DIAGNOSIS — N189 Chronic kidney disease, unspecified: Secondary | ICD-10-CM | POA: Insufficient documentation

## 2019-06-20 DIAGNOSIS — D631 Anemia in chronic kidney disease: Secondary | ICD-10-CM | POA: Insufficient documentation

## 2019-06-20 LAB — IRON AND TIBC
Iron: 58 ug/dL (ref 45–182)
Saturation Ratios: 19 % (ref 17.9–39.5)
TIBC: 308 ug/dL (ref 250–450)
UIBC: 250 ug/dL

## 2019-06-20 LAB — POCT HEMOGLOBIN-HEMACUE: Hemoglobin: 10.5 g/dL — ABNORMAL LOW (ref 13.0–17.0)

## 2019-06-20 LAB — FERRITIN: Ferritin: 185 ng/mL (ref 24–336)

## 2019-06-20 MED ORDER — EPOETIN ALFA 10000 UNIT/ML IJ SOLN
10000.0000 [IU] | INTRAMUSCULAR | Status: DC
  Administered 2019-06-20: 10000 [IU] via SUBCUTANEOUS

## 2019-06-20 MED ORDER — EPOETIN ALFA 10000 UNIT/ML IJ SOLN
INTRAMUSCULAR | Status: AC
  Filled 2019-06-20: qty 1

## 2019-06-24 ENCOUNTER — Other Ambulatory Visit: Payer: Self-pay | Admitting: Family

## 2019-06-27 ENCOUNTER — Ambulatory Visit (HOSPITAL_COMMUNITY)
Admission: RE | Admit: 2019-06-27 | Discharge: 2019-06-27 | Disposition: A | Discharge status: Home / Self Care | Payer: Medicare Other | Source: Ambulatory Visit | Attending: Nephrology | Admitting: Nephrology

## 2019-06-27 ENCOUNTER — Other Ambulatory Visit: Payer: Self-pay

## 2019-06-27 VITALS — BP 161/68 | HR 66 | Temp 96.7°F | Resp 20

## 2019-06-27 DIAGNOSIS — D631 Anemia in chronic kidney disease: Secondary | ICD-10-CM | POA: Insufficient documentation

## 2019-06-27 DIAGNOSIS — D649 Anemia, unspecified: Secondary | ICD-10-CM

## 2019-06-27 DIAGNOSIS — N189 Chronic kidney disease, unspecified: Secondary | ICD-10-CM | POA: Diagnosis not present

## 2019-06-27 LAB — POCT HEMOGLOBIN-HEMACUE: Hemoglobin: 10.6 g/dL — ABNORMAL LOW (ref 13.0–17.0)

## 2019-06-27 MED ORDER — EPOETIN ALFA 10000 UNIT/ML IJ SOLN
10000.0000 [IU] | INTRAMUSCULAR | Status: DC
  Administered 2019-06-27: 10000 [IU] via SUBCUTANEOUS

## 2019-06-27 MED ORDER — EPOETIN ALFA 10000 UNIT/ML IJ SOLN
INTRAMUSCULAR | Status: AC
  Administered 2019-06-27: 10000 [IU] via SUBCUTANEOUS
  Filled 2019-06-27: qty 1

## 2019-07-04 ENCOUNTER — Encounter (HOSPITAL_COMMUNITY): Payer: Medicare Other

## 2019-07-09 ENCOUNTER — Other Ambulatory Visit: Payer: Self-pay

## 2019-07-09 ENCOUNTER — Ambulatory Visit (HOSPITAL_COMMUNITY)
Admission: RE | Admit: 2019-07-09 | Discharge: 2019-07-09 | Disposition: A | Discharge status: Home / Self Care | Payer: Medicare Other | Source: Ambulatory Visit | Attending: Nephrology | Admitting: Nephrology

## 2019-07-09 VITALS — BP 172/61 | HR 64 | Temp 96.6°F | Resp 20

## 2019-07-09 DIAGNOSIS — N189 Chronic kidney disease, unspecified: Secondary | ICD-10-CM | POA: Diagnosis present

## 2019-07-09 DIAGNOSIS — D631 Anemia in chronic kidney disease: Secondary | ICD-10-CM | POA: Diagnosis not present

## 2019-07-09 DIAGNOSIS — D649 Anemia, unspecified: Secondary | ICD-10-CM

## 2019-07-09 LAB — POCT HEMOGLOBIN-HEMACUE: Hemoglobin: 11.3 g/dL — ABNORMAL LOW (ref 13.0–17.0)

## 2019-07-09 MED ORDER — EPOETIN ALFA 10000 UNIT/ML IJ SOLN
INTRAMUSCULAR | Status: AC
  Filled 2019-07-09: qty 1

## 2019-07-09 MED ORDER — EPOETIN ALFA 10000 UNIT/ML IJ SOLN
10000.0000 [IU] | INTRAMUSCULAR | Status: DC
  Administered 2019-07-09: 10000 [IU] via SUBCUTANEOUS

## 2019-07-11 ENCOUNTER — Encounter: Payer: Self-pay | Admitting: Family

## 2019-07-12 ENCOUNTER — Other Ambulatory Visit: Payer: Self-pay | Admitting: Family

## 2019-07-12 DIAGNOSIS — C221 Intrahepatic bile duct carcinoma: Secondary | ICD-10-CM

## 2019-07-16 ENCOUNTER — Other Ambulatory Visit: Payer: Self-pay

## 2019-07-16 ENCOUNTER — Other Ambulatory Visit (INDEPENDENT_AMBULATORY_CARE_PROVIDER_SITE_OTHER): Payer: Medicare Other

## 2019-07-16 ENCOUNTER — Encounter (HOSPITAL_COMMUNITY)
Admission: RE | Admit: 2019-07-16 | Discharge: 2019-07-16 | Disposition: A | Discharge status: Home / Self Care | Payer: Medicare Other | Source: Ambulatory Visit | Attending: Nephrology | Admitting: Nephrology

## 2019-07-16 VITALS — BP 142/65 | HR 71 | Temp 97.9°F | Resp 20

## 2019-07-16 DIAGNOSIS — N189 Chronic kidney disease, unspecified: Secondary | ICD-10-CM | POA: Insufficient documentation

## 2019-07-16 DIAGNOSIS — C221 Intrahepatic bile duct carcinoma: Secondary | ICD-10-CM

## 2019-07-16 DIAGNOSIS — D631 Anemia in chronic kidney disease: Secondary | ICD-10-CM | POA: Diagnosis present

## 2019-07-16 DIAGNOSIS — D649 Anemia, unspecified: Secondary | ICD-10-CM

## 2019-07-16 LAB — COMPREHENSIVE METABOLIC PANEL
ALT: 17 U/L (ref 0–53)
AST: 18 U/L (ref 0–37)
Albumin: 4 g/dL (ref 3.5–5.2)
Alkaline Phosphatase: 65 U/L (ref 39–117)
BUN: 47 mg/dL — ABNORMAL HIGH (ref 6–23)
CO2: 25 mEq/L (ref 19–32)
Calcium: 8.8 mg/dL (ref 8.4–10.5)
Chloride: 100 mEq/L (ref 96–112)
Creatinine, Ser: 3.32 mg/dL — ABNORMAL HIGH (ref 0.40–1.50)
GFR: 17.51 mL/min — ABNORMAL LOW (ref >60.00)
Glucose, Bld: 139 mg/dL — ABNORMAL HIGH (ref 70–99)
Potassium: 5 mEq/L (ref 3.5–5.1)
Sodium: 134 mEq/L — ABNORMAL LOW (ref 135–145)
Total Bilirubin: 0.3 mg/dL (ref 0.2–1.2)
Total Protein: 7.4 g/dL (ref 6.0–8.3)

## 2019-07-16 LAB — POCT HEMOGLOBIN-HEMACUE: Hemoglobin: 11.6 g/dL — ABNORMAL LOW (ref 13.0–17.0)

## 2019-07-16 MED ORDER — EPOETIN ALFA 10000 UNIT/ML IJ SOLN
10000.0000 [IU] | INTRAMUSCULAR | Status: DC
  Administered 2019-07-16: 10000 [IU] via SUBCUTANEOUS

## 2019-07-16 MED ORDER — EPOETIN ALFA 10000 UNIT/ML IJ SOLN
INTRAMUSCULAR | Status: AC
  Filled 2019-07-16: qty 1

## 2019-07-17 ENCOUNTER — Encounter: Payer: Self-pay | Admitting: Family

## 2019-07-18 ENCOUNTER — Encounter: Payer: Self-pay | Admitting: Family

## 2019-07-22 ENCOUNTER — Other Ambulatory Visit (HOSPITAL_COMMUNITY): Payer: Self-pay | Admitting: *Deleted

## 2019-07-23 ENCOUNTER — Ambulatory Visit (HOSPITAL_COMMUNITY)
Admission: RE | Admit: 2019-07-23 | Discharge: 2019-07-23 | Disposition: A | Discharge status: Home / Self Care | Payer: Medicare Other | Source: Ambulatory Visit | Attending: Nephrology | Admitting: Nephrology

## 2019-07-23 ENCOUNTER — Encounter: Payer: Self-pay | Admitting: Family

## 2019-07-23 ENCOUNTER — Other Ambulatory Visit: Payer: Self-pay | Admitting: Family

## 2019-07-23 ENCOUNTER — Other Ambulatory Visit: Payer: Self-pay

## 2019-07-23 VITALS — BP 140/56 | HR 70 | Temp 97.2°F | Resp 20 | Ht 67.0 in | Wt 138.0 lb

## 2019-07-23 DIAGNOSIS — N189 Chronic kidney disease, unspecified: Secondary | ICD-10-CM | POA: Diagnosis not present

## 2019-07-23 DIAGNOSIS — D631 Anemia in chronic kidney disease: Secondary | ICD-10-CM | POA: Diagnosis not present

## 2019-07-23 DIAGNOSIS — C221 Intrahepatic bile duct carcinoma: Secondary | ICD-10-CM

## 2019-07-23 DIAGNOSIS — D649 Anemia, unspecified: Secondary | ICD-10-CM

## 2019-07-23 LAB — IRON AND TIBC
Iron: 49 ug/dL (ref 45–182)
Saturation Ratios: 16 % — ABNORMAL LOW (ref 17.9–39.5)
TIBC: 301 ug/dL (ref 250–450)
UIBC: 252 ug/dL

## 2019-07-23 LAB — POCT HEMOGLOBIN-HEMACUE: Hemoglobin: 11.6 g/dL — ABNORMAL LOW (ref 13.0–17.0)

## 2019-07-23 LAB — FERRITIN: Ferritin: 120 ng/mL (ref 24–336)

## 2019-07-23 MED ORDER — EPOETIN ALFA 10000 UNIT/ML IJ SOLN
INTRAMUSCULAR | Status: AC
  Filled 2019-07-23: qty 1

## 2019-07-23 MED ORDER — EPOETIN ALFA 10000 UNIT/ML IJ SOLN
10000.0000 [IU] | INTRAMUSCULAR | Status: DC
  Administered 2019-07-23: 09:00:00 10000 [IU] via SUBCUTANEOUS

## 2019-07-23 MED ORDER — SODIUM CHLORIDE 0.9 % IV SOLN
Freq: Once | INTRAVENOUS | Status: AC
  Administered 2019-07-23: 09:00:00 via INTRAVENOUS

## 2019-07-24 ENCOUNTER — Other Ambulatory Visit: Payer: Self-pay | Admitting: Family

## 2019-07-24 ENCOUNTER — Encounter: Payer: Self-pay | Admitting: Family

## 2019-07-24 ENCOUNTER — Other Ambulatory Visit (INDEPENDENT_AMBULATORY_CARE_PROVIDER_SITE_OTHER): Payer: Medicare Other

## 2019-07-24 DIAGNOSIS — C221 Intrahepatic bile duct carcinoma: Secondary | ICD-10-CM

## 2019-07-24 DIAGNOSIS — N184 Chronic kidney disease, stage 4 (severe): Secondary | ICD-10-CM

## 2019-07-24 LAB — COMPREHENSIVE METABOLIC PANEL
ALT: 16 U/L (ref 0–53)
AST: 17 U/L (ref 0–37)
Albumin: 4.1 g/dL (ref 3.5–5.2)
Alkaline Phosphatase: 70 U/L (ref 39–117)
BUN: 45 mg/dL — ABNORMAL HIGH (ref 6–23)
CO2: 22 mEq/L (ref 19–32)
Calcium: 8.8 mg/dL (ref 8.4–10.5)
Chloride: 102 mEq/L (ref 96–112)
Creatinine, Ser: 3.13 mg/dL — ABNORMAL HIGH (ref 0.40–1.50)
GFR: 18.74 mL/min — ABNORMAL LOW (ref >60.00)
Glucose, Bld: 167 mg/dL — ABNORMAL HIGH (ref 70–99)
Potassium: 4.6 mEq/L (ref 3.5–5.1)
Sodium: 132 mEq/L — ABNORMAL LOW (ref 135–145)
Total Bilirubin: 0.4 mg/dL (ref 0.2–1.2)
Total Protein: 7.4 g/dL (ref 6.0–8.3)

## 2019-07-30 ENCOUNTER — Other Ambulatory Visit (INDEPENDENT_AMBULATORY_CARE_PROVIDER_SITE_OTHER): Payer: Medicare Other

## 2019-07-30 ENCOUNTER — Encounter: Payer: Self-pay | Admitting: Family

## 2019-07-30 ENCOUNTER — Other Ambulatory Visit: Payer: Self-pay | Admitting: Family

## 2019-07-30 ENCOUNTER — Ambulatory Visit (HOSPITAL_COMMUNITY)
Admission: RE | Admit: 2019-07-30 | Discharge: 2019-07-30 | Disposition: A | Discharge status: Home / Self Care | Payer: Medicare Other | Source: Ambulatory Visit | Attending: Nephrology | Admitting: Nephrology

## 2019-07-30 ENCOUNTER — Other Ambulatory Visit: Payer: Self-pay

## 2019-07-30 VITALS — BP 148/56 | HR 65 | Temp 97.6°F | Resp 20 | Ht 67.0 in | Wt 140.0 lb

## 2019-07-30 DIAGNOSIS — C221 Intrahepatic bile duct carcinoma: Secondary | ICD-10-CM

## 2019-07-30 DIAGNOSIS — N189 Chronic kidney disease, unspecified: Secondary | ICD-10-CM | POA: Insufficient documentation

## 2019-07-30 DIAGNOSIS — D649 Anemia, unspecified: Secondary | ICD-10-CM

## 2019-07-30 DIAGNOSIS — N184 Chronic kidney disease, stage 4 (severe): Secondary | ICD-10-CM

## 2019-07-30 DIAGNOSIS — D631 Anemia in chronic kidney disease: Secondary | ICD-10-CM | POA: Diagnosis present

## 2019-07-30 LAB — CBC WITH DIFFERENTIAL/PLATELET
Basophils Absolute: 0 10*3/uL (ref 0.0–0.1)
Basophils Relative: 0.6 % (ref 0.0–3.0)
Eosinophils Absolute: 0.3 10*3/uL (ref 0.0–0.7)
Eosinophils Relative: 3.7 % (ref 0.0–5.0)
HCT: 34.7 % — ABNORMAL LOW (ref 39.0–52.0)
Hemoglobin: 11.6 g/dL — ABNORMAL LOW (ref 13.0–17.0)
Lymphocytes Relative: 14.1 % (ref 12.0–46.0)
Lymphs Abs: 1.1 10*3/uL (ref 0.7–4.0)
MCHC: 33.3 g/dL (ref 30.0–36.0)
MCV: 96.7 fl (ref 78.0–100.0)
Monocytes Absolute: 1.1 10*3/uL — ABNORMAL HIGH (ref 0.1–1.0)
Monocytes Relative: 13.7 % — ABNORMAL HIGH (ref 3.0–12.0)
Neutro Abs: 5.3 10*3/uL (ref 1.4–7.7)
Neutrophils Relative %: 67.9 % (ref 43.0–77.0)
Platelets: 155 10*3/uL (ref 150.0–400.0)
RBC: 3.59 Mil/uL — ABNORMAL LOW (ref 4.22–5.81)
RDW: 14.9 % (ref 11.5–15.5)
WBC: 7.8 10*3/uL (ref 4.0–10.5)

## 2019-07-30 LAB — COMPREHENSIVE METABOLIC PANEL
ALT: 32 U/L (ref 0–53)
AST: 28 U/L (ref 0–37)
Albumin: 3.9 g/dL (ref 3.5–5.2)
Alkaline Phosphatase: 93 U/L (ref 39–117)
BUN: 47 mg/dL — ABNORMAL HIGH (ref 6–23)
CO2: 23 mEq/L (ref 19–32)
Calcium: 9.1 mg/dL (ref 8.4–10.5)
Chloride: 101 mEq/L (ref 96–112)
Creatinine, Ser: 3.04 mg/dL — ABNORMAL HIGH (ref 0.40–1.50)
GFR: 19.38 mL/min — ABNORMAL LOW (ref >60.00)
Glucose, Bld: 118 mg/dL — ABNORMAL HIGH (ref 70–99)
Potassium: 4.8 mEq/L (ref 3.5–5.1)
Sodium: 132 mEq/L — ABNORMAL LOW (ref 135–145)
Total Bilirubin: 0.6 mg/dL (ref 0.2–1.2)
Total Protein: 7.5 g/dL (ref 6.0–8.3)

## 2019-07-30 LAB — POCT HEMOGLOBIN-HEMACUE: Hemoglobin: 11.5 g/dL — ABNORMAL LOW (ref 13.0–17.0)

## 2019-07-30 MED ORDER — EPOETIN ALFA 10000 UNIT/ML IJ SOLN
10000.0000 [IU] | INTRAMUSCULAR | Status: DC
  Administered 2019-07-30: 10000 [IU] via SUBCUTANEOUS

## 2019-07-30 MED ORDER — EPOETIN ALFA 10000 UNIT/ML IJ SOLN
INTRAMUSCULAR | Status: AC
  Filled 2019-07-30: qty 1

## 2019-07-30 MED ORDER — SODIUM CHLORIDE 0.9 % IV SOLN
510.0000 mg | INTRAVENOUS | Status: DC
  Administered 2019-07-30: 510 mg via INTRAVENOUS
  Filled 2019-07-30: qty 17

## 2019-07-31 ENCOUNTER — Encounter: Payer: Self-pay | Admitting: Family

## 2019-08-02 ENCOUNTER — Encounter: Payer: Self-pay | Admitting: Family

## 2019-08-06 ENCOUNTER — Other Ambulatory Visit (HOSPITAL_COMMUNITY): Payer: Self-pay

## 2019-08-07 ENCOUNTER — Other Ambulatory Visit: Payer: Self-pay

## 2019-08-07 ENCOUNTER — Encounter (HOSPITAL_COMMUNITY)
Admission: RE | Admit: 2019-08-07 | Discharge: 2019-08-07 | Disposition: A | Discharge status: Home / Self Care | Payer: Medicare Other | Source: Ambulatory Visit | Attending: Nephrology | Admitting: Nephrology

## 2019-08-07 VITALS — BP 169/62 | HR 63 | Temp 96.8°F | Resp 20

## 2019-08-07 DIAGNOSIS — D649 Anemia, unspecified: Secondary | ICD-10-CM | POA: Diagnosis present

## 2019-08-07 LAB — POCT HEMOGLOBIN-HEMACUE: Hemoglobin: 12.3 g/dL — ABNORMAL LOW (ref 13.0–17.0)

## 2019-08-07 MED ORDER — SODIUM CHLORIDE 0.9 % IV SOLN
510.0000 mg | INTRAVENOUS | Status: DC
  Administered 2019-08-07: 510 mg via INTRAVENOUS
  Filled 2019-08-07: qty 17

## 2019-08-07 MED ORDER — EPOETIN ALFA 10000 UNIT/ML IJ SOLN: 10000.0000 [IU] | INTRAMUSCULAR | Status: DC

## 2019-08-07 MED ORDER — SODIUM CHLORIDE 0.9 % IV SOLN
INTRAVENOUS | Status: DC
  Administered 2019-08-07: 09:00:00 via INTRAVENOUS

## 2019-08-14 ENCOUNTER — Encounter (HOSPITAL_COMMUNITY): Payer: Medicare Other

## 2019-08-16 ENCOUNTER — Encounter: Payer: Self-pay | Admitting: Family

## 2019-08-21 ENCOUNTER — Ambulatory Visit (HOSPITAL_COMMUNITY)
Admission: RE | Admit: 2019-08-21 | Discharge: 2019-08-21 | Disposition: A | Discharge status: Home / Self Care | Payer: Medicare Other | Source: Ambulatory Visit | Attending: Nephrology | Admitting: Nephrology

## 2019-08-21 VITALS — BP 155/65 | HR 70 | Temp 96.7°F | Resp 20

## 2019-08-21 DIAGNOSIS — N184 Chronic kidney disease, stage 4 (severe): Secondary | ICD-10-CM | POA: Diagnosis present

## 2019-08-21 DIAGNOSIS — D631 Anemia in chronic kidney disease: Secondary | ICD-10-CM | POA: Insufficient documentation

## 2019-08-21 DIAGNOSIS — D649 Anemia, unspecified: Secondary | ICD-10-CM

## 2019-08-21 LAB — FERRITIN: Ferritin: 1086 ng/mL — ABNORMAL HIGH (ref 24–336)

## 2019-08-21 LAB — POCT HEMOGLOBIN-HEMACUE: Hemoglobin: 11.3 g/dL — ABNORMAL LOW (ref 13.0–17.0)

## 2019-08-21 LAB — IRON AND TIBC
Iron: 198 ug/dL — ABNORMAL HIGH (ref 45–182)
Saturation Ratios: 87 % — ABNORMAL HIGH (ref 17.9–39.5)
TIBC: 227 ug/dL — ABNORMAL LOW (ref 250–450)
UIBC: 29 ug/dL

## 2019-08-21 MED ORDER — EPOETIN ALFA 10000 UNIT/ML IJ SOLN
INTRAMUSCULAR | Status: AC
  Filled 2019-08-21: qty 1

## 2019-08-21 MED ORDER — EPOETIN ALFA 10000 UNIT/ML IJ SOLN
10000.0000 [IU] | INTRAMUSCULAR | Status: DC
  Administered 2019-08-21: 10000 [IU] via SUBCUTANEOUS

## 2019-08-26 ENCOUNTER — Encounter: Payer: Self-pay | Admitting: Family

## 2019-08-26 ENCOUNTER — Other Ambulatory Visit: Payer: Self-pay | Admitting: Family

## 2019-08-26 MED ORDER — MUPIROCIN 2 % EX OINT: 1.0000 "application " | TOPICAL_OINTMENT | Freq: Two times a day (BID) | CUTANEOUS | 0 refills | Status: DC

## 2019-08-28 ENCOUNTER — Ambulatory Visit (HOSPITAL_COMMUNITY)
Admission: RE | Admit: 2019-08-28 | Discharge: 2019-08-28 | Disposition: A | Discharge status: Home / Self Care | Payer: Medicare Other | Source: Ambulatory Visit | Attending: Nephrology | Admitting: Nephrology

## 2019-08-28 ENCOUNTER — Other Ambulatory Visit: Payer: Self-pay

## 2019-08-28 VITALS — BP 165/60 | HR 62 | Temp 95.8°F | Resp 20

## 2019-08-28 DIAGNOSIS — D649 Anemia, unspecified: Secondary | ICD-10-CM

## 2019-08-28 DIAGNOSIS — N184 Chronic kidney disease, stage 4 (severe): Secondary | ICD-10-CM | POA: Insufficient documentation

## 2019-08-28 DIAGNOSIS — D631 Anemia in chronic kidney disease: Secondary | ICD-10-CM | POA: Diagnosis not present

## 2019-08-28 LAB — POCT HEMOGLOBIN-HEMACUE: Hemoglobin: 11.1 g/dL — ABNORMAL LOW (ref 13.0–17.0)

## 2019-08-28 MED ORDER — EPOETIN ALFA 10000 UNIT/ML IJ SOLN
INTRAMUSCULAR | Status: AC
  Filled 2019-08-28: qty 1

## 2019-08-28 MED ORDER — EPOETIN ALFA 10000 UNIT/ML IJ SOLN
10000.0000 [IU] | INTRAMUSCULAR | Status: DC
  Administered 2019-08-28: 10000 [IU] via SUBCUTANEOUS

## 2019-09-01 ENCOUNTER — Encounter: Payer: Self-pay | Admitting: Family

## 2019-09-03 ENCOUNTER — Encounter: Payer: Self-pay | Admitting: Family

## 2019-09-04 ENCOUNTER — Ambulatory Visit (HOSPITAL_COMMUNITY)
Admission: RE | Admit: 2019-09-04 | Discharge: 2019-09-04 | Disposition: A | Discharge status: Home / Self Care | Payer: Medicare Other | Source: Ambulatory Visit | Attending: Nephrology | Admitting: Nephrology

## 2019-09-04 ENCOUNTER — Other Ambulatory Visit (INDEPENDENT_AMBULATORY_CARE_PROVIDER_SITE_OTHER): Payer: Medicare Other

## 2019-09-04 ENCOUNTER — Ambulatory Visit (INDEPENDENT_AMBULATORY_CARE_PROVIDER_SITE_OTHER): Payer: Medicare Other

## 2019-09-04 ENCOUNTER — Other Ambulatory Visit: Payer: Self-pay

## 2019-09-04 ENCOUNTER — Other Ambulatory Visit: Payer: Self-pay | Admitting: Family

## 2019-09-04 VITALS — BP 140/55 | HR 66 | Temp 97.2°F | Resp 20

## 2019-09-04 DIAGNOSIS — D631 Anemia in chronic kidney disease: Secondary | ICD-10-CM | POA: Insufficient documentation

## 2019-09-04 DIAGNOSIS — Z23 Encounter for immunization: Secondary | ICD-10-CM

## 2019-09-04 DIAGNOSIS — N189 Chronic kidney disease, unspecified: Secondary | ICD-10-CM | POA: Diagnosis present

## 2019-09-04 DIAGNOSIS — N184 Chronic kidney disease, stage 4 (severe): Secondary | ICD-10-CM | POA: Diagnosis not present

## 2019-09-04 DIAGNOSIS — D649 Anemia, unspecified: Secondary | ICD-10-CM

## 2019-09-04 DIAGNOSIS — C221 Intrahepatic bile duct carcinoma: Secondary | ICD-10-CM

## 2019-09-04 LAB — COMPREHENSIVE METABOLIC PANEL
ALT: 39 U/L (ref 0–53)
AST: 25 U/L (ref 0–37)
Albumin: 3.8 g/dL (ref 3.5–5.2)
Alkaline Phosphatase: 150 U/L — ABNORMAL HIGH (ref 39–117)
BUN: 48 mg/dL — ABNORMAL HIGH (ref 6–23)
CO2: 22 mEq/L (ref 19–32)
Calcium: 9.1 mg/dL (ref 8.4–10.5)
Chloride: 100 mEq/L (ref 96–112)
Creatinine, Ser: 3.09 mg/dL — ABNORMAL HIGH (ref 0.40–1.50)
GFR: 19.02 mL/min — ABNORMAL LOW (ref >60.00)
Glucose, Bld: 131 mg/dL — ABNORMAL HIGH (ref 70–99)
Potassium: 4.9 mEq/L (ref 3.5–5.1)
Sodium: 132 mEq/L — ABNORMAL LOW (ref 135–145)
Total Bilirubin: 0.4 mg/dL (ref 0.2–1.2)
Total Protein: 7.3 g/dL (ref 6.0–8.3)

## 2019-09-04 LAB — CBC WITH DIFFERENTIAL/PLATELET
Basophils Absolute: 0 10*3/uL (ref 0.0–0.1)
Basophils Relative: 0.6 % (ref 0.0–3.0)
Eosinophils Absolute: 0.4 10*3/uL (ref 0.0–0.7)
Eosinophils Relative: 5.2 % — ABNORMAL HIGH (ref 0.0–5.0)
HCT: 32.4 % — ABNORMAL LOW (ref 39.0–52.0)
Hemoglobin: 10.8 g/dL — ABNORMAL LOW (ref 13.0–17.0)
Lymphocytes Relative: 18 % (ref 12.0–46.0)
Lymphs Abs: 1.3 10*3/uL (ref 0.7–4.0)
MCHC: 33.2 g/dL (ref 30.0–36.0)
MCV: 98.5 fl (ref 78.0–100.0)
Monocytes Absolute: 0.8 10*3/uL (ref 0.1–1.0)
Monocytes Relative: 10.8 % (ref 3.0–12.0)
Neutro Abs: 4.8 10*3/uL (ref 1.4–7.7)
Neutrophils Relative %: 65.4 % (ref 43.0–77.0)
Platelets: 157 10*3/uL (ref 150.0–400.0)
RBC: 3.29 Mil/uL — ABNORMAL LOW (ref 4.22–5.81)
RDW: 16.2 % — ABNORMAL HIGH (ref 11.5–15.5)
WBC: 7.3 10*3/uL (ref 4.0–10.5)

## 2019-09-04 LAB — POCT HEMOGLOBIN-HEMACUE: Hemoglobin: 10.7 g/dL — ABNORMAL LOW (ref 13.0–17.0)

## 2019-09-04 MED ORDER — EPOETIN ALFA 10000 UNIT/ML IJ SOLN
INTRAMUSCULAR | Status: AC
  Filled 2019-09-04: qty 1

## 2019-09-04 MED ORDER — EPOETIN ALFA 10000 UNIT/ML IJ SOLN
10000.0000 [IU] | INTRAMUSCULAR | Status: DC
  Administered 2019-09-04: 10000 [IU] via SUBCUTANEOUS

## 2019-09-11 ENCOUNTER — Other Ambulatory Visit: Payer: Self-pay

## 2019-09-11 ENCOUNTER — Ambulatory Visit (HOSPITAL_COMMUNITY)
Admission: RE | Admit: 2019-09-11 | Discharge: 2019-09-11 | Disposition: A | Discharge status: Home / Self Care | Payer: Medicare Other | Source: Ambulatory Visit | Attending: Nephrology | Admitting: Nephrology

## 2019-09-11 VITALS — BP 148/63 | HR 61 | Temp 96.6°F | Resp 20

## 2019-09-11 DIAGNOSIS — D649 Anemia, unspecified: Secondary | ICD-10-CM

## 2019-09-11 DIAGNOSIS — N189 Chronic kidney disease, unspecified: Secondary | ICD-10-CM | POA: Insufficient documentation

## 2019-09-11 DIAGNOSIS — D631 Anemia in chronic kidney disease: Secondary | ICD-10-CM | POA: Diagnosis not present

## 2019-09-11 LAB — POCT HEMOGLOBIN-HEMACUE: Hemoglobin: 11.2 g/dL — ABNORMAL LOW (ref 13.0–17.0)

## 2019-09-11 MED ORDER — EPOETIN ALFA 10000 UNIT/ML IJ SOLN
INTRAMUSCULAR | Status: AC
  Filled 2019-09-11: qty 1

## 2019-09-11 MED ORDER — EPOETIN ALFA 10000 UNIT/ML IJ SOLN
10000.0000 [IU] | INTRAMUSCULAR | Status: DC
  Administered 2019-09-11: 10000 [IU] via SUBCUTANEOUS

## 2019-09-18 ENCOUNTER — Encounter (HOSPITAL_COMMUNITY): Payer: Medicare Other

## 2019-09-25 ENCOUNTER — Ambulatory Visit (HOSPITAL_COMMUNITY)
Admission: RE | Admit: 2019-09-25 | Discharge: 2019-09-25 | Disposition: A | Discharge status: Home / Self Care | Payer: Medicare Other | Source: Ambulatory Visit | Attending: Nephrology | Admitting: Nephrology

## 2019-09-25 ENCOUNTER — Other Ambulatory Visit: Payer: Self-pay

## 2019-09-25 VITALS — BP 139/59 | HR 66 | Temp 95.4°F | Resp 20

## 2019-09-25 DIAGNOSIS — N189 Chronic kidney disease, unspecified: Secondary | ICD-10-CM | POA: Diagnosis not present

## 2019-09-25 DIAGNOSIS — D631 Anemia in chronic kidney disease: Secondary | ICD-10-CM | POA: Diagnosis not present

## 2019-09-25 DIAGNOSIS — D649 Anemia, unspecified: Secondary | ICD-10-CM

## 2019-09-25 LAB — IRON AND TIBC
Iron: 110 ug/dL (ref 45–182)
Saturation Ratios: 44 % — ABNORMAL HIGH (ref 17.9–39.5)
TIBC: 251 ug/dL (ref 250–450)
UIBC: 141 ug/dL

## 2019-09-25 LAB — POCT HEMOGLOBIN-HEMACUE: Hemoglobin: 11.5 g/dL — ABNORMAL LOW (ref 13.0–17.0)

## 2019-09-25 LAB — FERRITIN: Ferritin: 452 ng/mL — ABNORMAL HIGH (ref 24–336)

## 2019-09-25 MED ORDER — EPOETIN ALFA 10000 UNIT/ML IJ SOLN
INTRAMUSCULAR | Status: AC
  Filled 2019-09-25: qty 1

## 2019-09-25 MED ORDER — EPOETIN ALFA 10000 UNIT/ML IJ SOLN
10000.0000 [IU] | INTRAMUSCULAR | Status: DC
  Administered 2019-09-25: 10000 [IU] via SUBCUTANEOUS

## 2019-10-02 ENCOUNTER — Other Ambulatory Visit: Payer: Self-pay

## 2019-10-02 ENCOUNTER — Ambulatory Visit (HOSPITAL_COMMUNITY)
Admission: RE | Admit: 2019-10-02 | Discharge: 2019-10-02 | Disposition: A | Discharge status: Home / Self Care | Payer: Medicare Other | Source: Ambulatory Visit | Attending: Nephrology | Admitting: Nephrology

## 2019-10-02 VITALS — BP 172/66 | HR 66 | Temp 94.1°F | Resp 20

## 2019-10-02 DIAGNOSIS — D631 Anemia in chronic kidney disease: Secondary | ICD-10-CM | POA: Insufficient documentation

## 2019-10-02 DIAGNOSIS — N189 Chronic kidney disease, unspecified: Secondary | ICD-10-CM | POA: Diagnosis present

## 2019-10-02 DIAGNOSIS — D649 Anemia, unspecified: Secondary | ICD-10-CM

## 2019-10-02 MED ORDER — EPOETIN ALFA 10000 UNIT/ML IJ SOLN
INTRAMUSCULAR | Status: AC
  Administered 2019-10-02: 10000 [IU] via SUBCUTANEOUS
  Filled 2019-10-02: qty 1

## 2019-10-02 MED ORDER — EPOETIN ALFA 10000 UNIT/ML IJ SOLN
10000.0000 [IU] | INTRAMUSCULAR | Status: DC
  Administered 2019-10-02: 08:00:00 10000 [IU] via SUBCUTANEOUS

## 2019-10-03 LAB — POCT HEMOGLOBIN-HEMACUE: Hemoglobin: 11.8 g/dL — ABNORMAL LOW (ref 13.0–17.0)

## 2019-10-04 ENCOUNTER — Ambulatory Visit: Payer: Medicare Other | Admitting: Cardiovascular Disease

## 2019-10-04 ENCOUNTER — Other Ambulatory Visit: Payer: Self-pay

## 2019-10-04 ENCOUNTER — Encounter: Payer: Self-pay | Admitting: Cardiovascular Disease

## 2019-10-04 DIAGNOSIS — I480 Paroxysmal atrial fibrillation: Secondary | ICD-10-CM

## 2019-10-04 DIAGNOSIS — I4891 Unspecified atrial fibrillation: Secondary | ICD-10-CM | POA: Insufficient documentation

## 2019-10-04 NOTE — Patient Instructions (Signed)
Medication Instructions:  Your physician recommends that you continue on your current medications as directed. Please refer to the Current Medication list given to you today.  *If you need a refill on your cardiac medications before your next appointment, please call your pharmacy*  Lab Work: None Ordered   Testing/Procedures: None Ordered   Follow-Up: At [facility], you and your health needs are our priority.  As part of our continuing mission to provide you with exceptional heart care, we have created designated Provider Care Teams.  These Care Teams include your primary Cardiologist (physician) and Advanced Practice Providers (APPs -  Physician Assistants and Nurse Practitioners) who all work together to provide you with the care you need, when you need it.  Your next appointment:   6 months  The format for your next appointment:   Either In Person or Virtual  Provider:   Robbie Lis, PA-C  Daune Perch, NP

## 2019-10-04 NOTE — Progress Notes (Signed)
CARDIOLOGY OFFICE NOTE  Date:  10/04/2019    Gerald Jacobs Date of Birth: 06/24/1925 Medical Record #[id number]  PCP:  Marrian Salvage, FNP  Cardiologist:    Genice Rouge   Chief Complaint  Patient presents with  . Atrial Fibrillation   Problem List 1. Essential HTN 2. DM 3. CKD - sees [name],   baseline Cr = 1.94 4, Anemia - taking procrit  5. 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  6. PVCs    Notes from Truitt Merle:   Gerald Jacobs is a 85 y.o. male who presents today for a 4 month check. Former patient of Dr. Sherryl Barters.   He has a history of HTN, palpitations, dyslipidemia, diabetes, and mild renal insufficiency. Also has a history of hyperuricemia. He has anemia secondary to chronic renal disease and receives ejections of Procrit about every 3 weeks depending on his hemoglobin level. Dr. Posey Pronto is his nephrologist.   He was last seen back in November and felt to be doing well. Had his Medicare well check last month by PCP.   Comes back today. Here alone. Wife was to be seen also today - but having shoulder issues due to a fall and has cancelled. He is doing ok. No chest pain. He is trying to stay active but notes he has slowed down some. Has stable DOE. No falls. No passing out. Occasional palpitations but nothing that really worries him or produces symptoms. He is pleased with how he is doing. He has a brother who is alive at 31.   Oct. 6, 2017: This is the first time meeting Gerald Jacobs . He is a transfer from [employer]. He has a history of hypertension and diabetes Is doing well. Has some shoulder issues Also goes to the [facility]   Plays golf on Thursdays - limited by his shoulder pain  No longer does his yard work  No CP ,  Occasional dyspnea - also has some anemia  BP is a bit high today . Takes his BP at home and the reading are typically quite good.  Retired from [employer] - retired in 1989.    March 15, 2017: Gerald Jacobs is seen back today for follow up of his HTN. BP has been a little high  Not playing much golf recently .   Nov. 6, 2018:  Having some palpitations.   Has had these in the past.   Metoprolol was increased which solved the issue  Also has bile duct cancer - is under some additional stress due to this  Has lost 10-15 labs over the past 6 months .   Unable to eat much  No energy.   Has had anemia.  Unable to do any yard work now  May 10, 2018:  Was hospitalized at [facility] recently    For complications of bile duct cancer .   Has had previous stenting of his bile duct.   Has had them replaced several times.  Cancer has progressed slightly .    Feb. 17, 2020: Has been back in the hospital at [facility].  These was some discussion about stopping ASA  Has issues with anemia. Was found to have atrial fib at his last Appt at [facility] oncology.   Was recommended to see Dr. Rayann Heman.   He is completely asymptomatic from an atrial fib standpoint.   He cannot tell that his HR is irreg.  Has  chronic anemia but denies any bleeding in his stool or urine.     October 04, 2019:  Gerald Jacobs is seen back for follow-up visit regarding his atrial fibrillation.  He now is on Eliquis 2.5 mg twice a day. Doing well from a cardiac standpoint  Has biliary cancer and has some nausea from this  Able to eat and drink fairly well.     Past Medical History:  Diagnosis Date  . Bilateral shoulder pain 07/28/2016  . Cancer [facility])    prostate  . Chest pain 10/27/2016  . Chronic anemia    With normal iron studies and normal serum protein electrophoresis  . Coronary artery disease    Mild  . Cough 10/27/2016  . Diabetes mellitus    Adult onset  . Essential hypertension   . GERD (gastroesophageal reflux disease)    occ tums  . History of gout   . Hyperbilirubinemia 06/15/2017  . Hyponatremia 06/15/2017  . Intermittent palpitations 07/23/2014  . Liver mass   . Mass of bile duct 06/27/2017  . Mild  renal insufficiency   . Nocturia    x2  . Normocytic anemia 06/23/2016  . Osteoarthritis   . Sciatica of right side 07/28/2016  . Statin myopathy 07/28/2016  . Weight loss 06/15/2017    Past Surgical History:  Procedure Laterality Date  . CHOLECYSTECTOMY  02/04/2014   DR Zella Richer  . CHOLECYSTECTOMY N/A 02/04/2014   Procedure: LAPAROSCOPIC CHOLECYSTECTOMY with intraoperative cholangiogram;  Surgeon: Odis Hollingshead, MD;  Location: [facility];  Service: General;  Laterality: N/A;  . ERCP N/A 10/15/2013   Procedure: ENDOSCOPIC RETROGRADE CHOLANGIOPANCREATOGRAPHY (ERCP);  Surgeon: Jeryl Columbia, MD;  Location: [facility];  Service: Endoscopy;  Laterality: N/A;  . KNEE SURGERY Right 1979  . melanoma surgery  1970's   on scalp  . PROSTATE SURGERY  1993   Prostate Cancer  . SPHINCTEROTOMY  10/15/2013   Procedure: SPHINCTEROTOMY;  Surgeon: Jeryl Columbia, MD;  Location: [facility];  Service: Endoscopy;;     Medications: Current Outpatient Medications  Medication Sig Dispense Refill  . allopurinol (ZYLOPRIM) 100 MG tablet TAKE 1 TABLET BY MOUTH EVERY DAY 90 tablet 0  . amLODipine (NORVASC) 5 MG tablet Take 1 tablet (5 mg total) by mouth daily. 90 tablet 1  . apixaban (ELIQUIS) 2.5 MG TABS tablet Take 1 tablet (2.5 mg total) by mouth 2 (two) times daily. 180 tablet 1  . Biotin 1000 MCG tablet Take 1,000 mcg by mouth daily.    . cholecalciferol (VITAMIN D) 1000 units tablet Take 2,000 Units by mouth daily.     . diphenhydramine-acetaminophen (TYLENOL PM) 25-500 MG TABS tablet Take 1 tablet by mouth at bedtime as needed (pain).    Marland Kitchen epoetin alfa (EPOGEN,PROCRIT) 53646 UNIT/ML injection Inject 10,000 Units into the vein once a week.    Marland Kitchen glucose blood (ONE TOUCH ULTRA TEST) test strip USE ONE STRIP PER TEST. CHECK BLOOD SUGARS 1-4 TIMES PER DAY AS INSTRUCTED. 100 each 4  . JANUVIA 50 MG tablet TAKE 1 TABLET BY MOUTH EVERY DAY 90 tablet 1  . metoprolol succinate (TOPROL-XL) 50 MG 24 hr tablet  Take 1 tablet (50 mg total) by mouth 2 (two) times daily. Take with or immediately following a meal. 180 tablet 3  . mupirocin ointment (BACTROBAN) 2 % Apply 1 application topically 2 (two) times daily. 22 g 0  . ondansetron (ZOFRAN) 8 MG tablet Take by mouth every 8 (eight) hours as needed for nausea or  vomiting.    Glory Rosebush Delica Lancets 19J MISC TEST BLOOD SUGARS 1-4 TIMES A DAY AS INSTRUCTED. DX CODE: E11.9 100 each 3  . pantoprazole (PROTONIX) 40 MG tablet Take 40 mg by mouth daily as needed (indigestion).     . promethazine (PHENERGAN) 12.5 MG tablet Take 12.5 mg by mouth every 6 (six) hours as needed for nausea or vomiting.    . protein supplement shake (PREMIER PROTEIN) LIQD Take 2 oz by mouth daily.    . sodium chloride (SALINE MIST) 0.65 % nasal spray Place into the nose.    . triamcinolone (KENALOG) 0.025 % cream Apply 1 application topically 2 (two) times daily as needed (itching).     . triamcinolone (KENALOG) 0.1 % paste Use as directed 1 application in the mouth or throat 2 (two) times daily.      No current facility-administered medications for this visit.     Allergies: Allergies  Allergen Reactions  . Amoxicillin-Pot Clavulanate Diarrhea    Diarrhea  Patient reports contracted C.diff while taking this  . Colchicine Other (See Comments)    GI problems  . Flexeril [Cyclobenzaprine Hcl] Other (See Comments)    GI problems   . Levaquin [Levofloxacin In D5w] Other (See Comments)    Insomnia   . Percocet [Oxycodone-Acetaminophen] Nausea And Vomiting  . Zebeta Other (See Comments)    Gi problems  . Bisoprolol Fumarate Other (See Comments)    Gi problems    Social History: The patient  reports that he quit smoking about 40 years ago. His smoking use included cigarettes. He has a 45.00 pack-year smoking history. He has never used smokeless tobacco. He reports current alcohol use. He reports that he does not use drugs.   Family History: The patient's family history  includes Stroke in his brother, brother, brother, father, mother, and sister.   Review of Systems: Please see the history of present illness.   Otherwise, the review of systems is positive for none.   All other systems are reviewed and negative.   Physical Exam: Blood pressure 140/60, pulse 62, height 5\' 7"  (1.702 m), weight 140 lb 12.8 oz (63.9 kg), SpO2 98 %.  GEN:  Well nourished, well developed in no acute distress HEENT: Normal NECK: No JVD; No carotid bruits LYMPHATICS: No lymphadenopathy CARDIAC: RRR , soft systolic murmur  RESPIRATORY:  Clear to auscultation without rales, wheezing or rhonchi  ABDOMEN: Soft, non-tender, non-distended MUSCULOSKELETAL:  No edema; No deformity  SKIN: Warm and dry NEUROLOGIC:  Alert and oriented x 3    LABORATORY DATA:  EKG:     Lab Results  Component Value Date   WBC 7.3 09/04/2019   HGB 11.8 (L) 10/02/2019   HCT 32.4 (L) 09/04/2019   PLT 157.0 09/04/2019   GLUCOSE 131 (H) 09/04/2019   CHOL 195 02/08/2017   TRIG 154.0 (H) 02/08/2017   HDL 74.00 02/08/2017   LDLCALC 90 02/08/2017   ALT 39 09/04/2019   AST 25 09/04/2019   NA 132 (L) 09/04/2019   K 4.9 09/04/2019   CL 100 09/04/2019   CREATININE 3.09 (H) 09/04/2019   BUN 48 (H) 09/04/2019   CO2 22 09/04/2019   TSH 1.954 10/03/2018   PSA 0.01 (L) 02/08/2017   INR 1.06 06/19/2017   HGBA1C 5.8 12/03/2018   MICROALBUR 92.6 (H) 05/21/2018    BNP (last 3 results) No results for input(s): BNP in the last 8760 hours.  ProBNP (last 3 results) No results for input(s): PROBNP in the  last 8760 hours.   Other Studies Rviewed Today:   Assessment/Plan: 1. atrail fib:   Rhythm is very regular today.  I suspect that he has paroxysmal A. fib.  He was in normal sinus rhythm at his last EKG.  Continue Eliquis.  2. PVCS:     Seems stable   3. CKD -   Followed by primary md    4. DM -  Followed by primary md    5. History of gout -     Current medicines are reviewed with the  patient today.  The patient does not have concerns regarding medicines other than what has been noted above.  The following changes have been made:  See above.  Labs/ tests ordered today include:    No orders of the defined types were placed in this encounter.   Mertie Moores, MD  10/04/2019 3:17 PM    [facility] [place],  [address] [place], [place]  [address] Pager [phone] Phone: [phone]; Fax: [phone]

## 2019-10-09 ENCOUNTER — Ambulatory Visit (HOSPITAL_COMMUNITY)
Admission: RE | Admit: 2019-10-09 | Discharge: 2019-10-09 | Disposition: A | Discharge status: Home / Self Care | Payer: Medicare Other | Source: Ambulatory Visit | Attending: Nephrology | Admitting: Nephrology

## 2019-10-09 ENCOUNTER — Other Ambulatory Visit: Payer: Self-pay

## 2019-10-09 VITALS — BP 170/64 | HR 68 | Resp 18

## 2019-10-09 DIAGNOSIS — D649 Anemia, unspecified: Secondary | ICD-10-CM | POA: Diagnosis present

## 2019-10-09 LAB — POCT HEMOGLOBIN-HEMACUE: Hemoglobin: 12 g/dL — ABNORMAL LOW (ref 13.0–17.0)

## 2019-10-09 MED ORDER — EPOETIN ALFA 10000 UNIT/ML IJ SOLN: 10000.0000 [IU] | INTRAMUSCULAR | Status: DC

## 2019-10-10 ENCOUNTER — Other Ambulatory Visit: Payer: Self-pay | Admitting: Family

## 2019-10-10 MED ORDER — ALLOPURINOL 100 MG PO TABS: 100.0000 mg | ORAL_TABLET | Freq: Every day | ORAL | 0 refills | Status: DC

## 2019-10-10 NOTE — Telephone Encounter (Signed)
allopurinol (ZYLOPRIM) 100 MG tablet    Patient is requesting refill     Pharmacy:  [id number] - [place], [address] [phone] (Phone) [phone] (Fax)

## 2019-10-15 ENCOUNTER — Telehealth: Payer: Self-pay | Admitting: Family

## 2019-10-15 NOTE — Telephone Encounter (Signed)
rx refill glucose blood (ONE TOUCH ULTRA TEST) test strip  PHARMACY [id number] - [place], [place] [address] (Phone) [phone] (Fax)

## 2019-10-16 ENCOUNTER — Encounter (HOSPITAL_COMMUNITY): Payer: Medicare Other

## 2019-10-16 ENCOUNTER — Other Ambulatory Visit: Payer: Self-pay | Admitting: Family

## 2019-10-16 DIAGNOSIS — E119 Type 2 diabetes mellitus without complications: Secondary | ICD-10-CM

## 2019-10-16 MED ORDER — GLUCOSE BLOOD VI STRP: ORAL_STRIP | 4 refills | Status: AC

## 2019-10-16 NOTE — Telephone Encounter (Signed)
Spoke with patient this morning

## 2019-10-16 NOTE — Telephone Encounter (Signed)
Pt called back in to follow up on request for test strips. Pt says that he is out of these strips.

## 2019-10-16 NOTE — Telephone Encounter (Signed)
Rx sent 

## 2019-10-20 ENCOUNTER — Encounter: Payer: Self-pay | Admitting: Family

## 2019-10-21 ENCOUNTER — Other Ambulatory Visit (INDEPENDENT_AMBULATORY_CARE_PROVIDER_SITE_OTHER): Payer: Medicare Other

## 2019-10-21 ENCOUNTER — Telehealth: Payer: Self-pay

## 2019-10-21 ENCOUNTER — Encounter: Payer: Self-pay | Admitting: Family

## 2019-10-21 DIAGNOSIS — C221 Intrahepatic bile duct carcinoma: Secondary | ICD-10-CM

## 2019-10-21 LAB — COMPREHENSIVE METABOLIC PANEL
ALT: 26 U/L (ref 0–53)
AST: 25 U/L (ref 0–37)
Albumin: 3.5 g/dL (ref 3.5–5.2)
Alkaline Phosphatase: 186 U/L — ABNORMAL HIGH (ref 39–117)
BUN: 44 mg/dL — ABNORMAL HIGH (ref 6–23)
CO2: 23 mEq/L (ref 19–32)
Calcium: 8.9 mg/dL (ref 8.4–10.5)
Chloride: 98 mEq/L (ref 96–112)
Creatinine, Ser: 2.74 mg/dL — ABNORMAL HIGH (ref 0.40–1.50)
GFR: 21.84 mL/min — ABNORMAL LOW (ref >60.00)
Glucose, Bld: 165 mg/dL — ABNORMAL HIGH (ref 70–99)
Potassium: 5 mEq/L (ref 3.5–5.1)
Sodium: 130 mEq/L — ABNORMAL LOW (ref 135–145)
Total Bilirubin: 0.5 mg/dL (ref 0.2–1.2)
Total Protein: 7.4 g/dL (ref 6.0–8.3)

## 2019-10-21 LAB — CBC WITH DIFFERENTIAL/PLATELET
Basophils Absolute: 0.1 10*3/uL (ref 0.0–0.1)
Basophils Relative: 1 % (ref 0.0–3.0)
Eosinophils Absolute: 0.3 10*3/uL (ref 0.0–0.7)
Eosinophils Relative: 4.2 % (ref 0.0–5.0)
HCT: 34.5 % — ABNORMAL LOW (ref 39.0–52.0)
Hemoglobin: 11.6 g/dL — ABNORMAL LOW (ref 13.0–17.0)
Lymphocytes Relative: 17.2 % (ref 12.0–46.0)
Lymphs Abs: 1.2 10*3/uL (ref 0.7–4.0)
MCHC: 33.6 g/dL (ref 30.0–36.0)
MCV: 96.7 fl (ref 78.0–100.0)
Monocytes Absolute: 0.7 10*3/uL (ref 0.1–1.0)
Monocytes Relative: 10.5 % (ref 3.0–12.0)
Neutro Abs: 4.6 10*3/uL (ref 1.4–7.7)
Neutrophils Relative %: 67.1 % (ref 43.0–77.0)
Platelets: 177 10*3/uL (ref 150.0–400.0)
RBC: 3.57 Mil/uL — ABNORMAL LOW (ref 4.22–5.81)
RDW: 14.7 % (ref 11.5–15.5)
WBC: 6.9 10*3/uL (ref 4.0–10.5)

## 2019-10-21 NOTE — Telephone Encounter (Signed)
Spoke with daughter. She would like to be called with results once they are back.

## 2019-10-21 NOTE — Addendum Note (Signed)
Addended by: Sherlene Shams on: 10/21/2019 10:11 AM   Modules accepted: Orders

## 2019-10-21 NOTE — Telephone Encounter (Signed)
Copied from [place] [id number]. Topic: General - Inquiry >> Oct 21, 2019  8:39 AM Richardo Priest, NT wrote: Reason for CRM: Pt's daughter called in stating she would like some stat orders for lab work for pt. States she is available to talk after 9:00. Please advise.

## 2019-10-21 NOTE — Telephone Encounter (Signed)
Copied from [place] [id number]. Topic: General - Inquiry >> Oct 21, 2019  8:39 AM Richardo Priest, NT wrote: Reason for CRM: Pt's daughter called in stating she would like some stat orders for lab work for pt. States she is available to talk after 9:00. Please advise.

## 2019-10-21 NOTE — Telephone Encounter (Signed)
The labs are in place- he can come do at his convenience today.

## 2019-10-28 ENCOUNTER — Encounter: Payer: Self-pay | Admitting: Family

## 2019-10-29 ENCOUNTER — Other Ambulatory Visit: Payer: Self-pay | Admitting: Family

## 2019-10-29 DIAGNOSIS — C221 Intrahepatic bile duct carcinoma: Secondary | ICD-10-CM

## 2019-10-30 ENCOUNTER — Ambulatory Visit (HOSPITAL_COMMUNITY)
Admission: RE | Admit: 2019-10-30 | Discharge: 2019-10-30 | Disposition: A | Discharge status: Home / Self Care | Payer: Medicare Other | Source: Ambulatory Visit | Attending: Nephrology | Admitting: Nephrology

## 2019-10-30 ENCOUNTER — Other Ambulatory Visit: Payer: Self-pay

## 2019-10-30 VITALS — BP 135/59 | HR 65 | Temp 97.5°F | Resp 14 | Ht 68.0 in | Wt 140.0 lb

## 2019-10-30 DIAGNOSIS — N184 Chronic kidney disease, stage 4 (severe): Secondary | ICD-10-CM | POA: Diagnosis present

## 2019-10-30 DIAGNOSIS — D649 Anemia, unspecified: Secondary | ICD-10-CM

## 2019-10-30 DIAGNOSIS — D631 Anemia in chronic kidney disease: Secondary | ICD-10-CM | POA: Diagnosis not present

## 2019-10-30 LAB — FERRITIN: Ferritin: 592 ng/mL — ABNORMAL HIGH (ref 24–336)

## 2019-10-30 LAB — IRON AND TIBC
Iron: 110 ug/dL (ref 45–182)
Saturation Ratios: 47 % — ABNORMAL HIGH (ref 17.9–39.5)
TIBC: 235 ug/dL — ABNORMAL LOW (ref 250–450)
UIBC: 125 ug/dL

## 2019-10-30 LAB — POCT HEMOGLOBIN-HEMACUE: Hemoglobin: 10.6 g/dL — ABNORMAL LOW (ref 13.0–17.0)

## 2019-10-30 MED ORDER — EPOETIN ALFA 10000 UNIT/ML IJ SOLN
10000.0000 [IU] | INTRAMUSCULAR | Status: DC
  Administered 2019-10-30: 10000 [IU] via SUBCUTANEOUS

## 2019-10-30 MED ORDER — EPOETIN ALFA 10000 UNIT/ML IJ SOLN
INTRAMUSCULAR | Status: AC
  Filled 2019-10-30: qty 1

## 2019-11-05 ENCOUNTER — Other Ambulatory Visit: Payer: Self-pay

## 2019-11-05 ENCOUNTER — Ambulatory Visit (HOSPITAL_COMMUNITY)
Admission: RE | Admit: 2019-11-05 | Discharge: 2019-11-05 | Disposition: A | Discharge status: Home / Self Care | Payer: Medicare Other | Source: Ambulatory Visit | Attending: Nephrology | Admitting: Nephrology

## 2019-11-05 VITALS — BP 155/70 | HR 71 | Temp 96.4°F | Resp 20

## 2019-11-05 DIAGNOSIS — D649 Anemia, unspecified: Secondary | ICD-10-CM | POA: Insufficient documentation

## 2019-11-05 DIAGNOSIS — D631 Anemia in chronic kidney disease: Secondary | ICD-10-CM | POA: Diagnosis not present

## 2019-11-05 DIAGNOSIS — N184 Chronic kidney disease, stage 4 (severe): Secondary | ICD-10-CM | POA: Diagnosis not present

## 2019-11-05 LAB — POCT HEMOGLOBIN-HEMACUE: Hemoglobin: 9.5 g/dL — ABNORMAL LOW (ref 13.0–17.0)

## 2019-11-05 MED ORDER — EPOETIN ALFA 10000 UNIT/ML IJ SOLN
INTRAMUSCULAR | Status: AC
  Filled 2019-11-05: qty 1

## 2019-11-05 MED ORDER — EPOETIN ALFA 10000 UNIT/ML IJ SOLN
10000.0000 [IU] | INTRAMUSCULAR | Status: DC
  Administered 2019-11-05: 10000 [IU] via SUBCUTANEOUS

## 2019-11-06 ENCOUNTER — Encounter (HOSPITAL_COMMUNITY): Payer: Medicare Other

## 2019-11-11 ENCOUNTER — Encounter: Payer: Self-pay | Admitting: Family

## 2019-11-12 ENCOUNTER — Encounter: Payer: Self-pay | Admitting: Family

## 2019-11-12 ENCOUNTER — Ambulatory Visit (HOSPITAL_COMMUNITY)
Admission: RE | Admit: 2019-11-12 | Discharge: 2019-11-12 | Disposition: A | Discharge status: Home / Self Care | Payer: Medicare Other | Source: Ambulatory Visit | Attending: Nephrology | Admitting: Nephrology

## 2019-11-12 ENCOUNTER — Other Ambulatory Visit: Payer: Self-pay

## 2019-11-12 ENCOUNTER — Other Ambulatory Visit: Payer: Self-pay | Admitting: Family

## 2019-11-12 ENCOUNTER — Other Ambulatory Visit (INDEPENDENT_AMBULATORY_CARE_PROVIDER_SITE_OTHER): Payer: Medicare Other

## 2019-11-12 VITALS — BP 150/64 | HR 71 | Temp 95.0°F | Resp 20

## 2019-11-12 DIAGNOSIS — D631 Anemia in chronic kidney disease: Secondary | ICD-10-CM | POA: Insufficient documentation

## 2019-11-12 DIAGNOSIS — N184 Chronic kidney disease, stage 4 (severe): Secondary | ICD-10-CM | POA: Insufficient documentation

## 2019-11-12 DIAGNOSIS — C221 Intrahepatic bile duct carcinoma: Secondary | ICD-10-CM

## 2019-11-12 DIAGNOSIS — D649 Anemia, unspecified: Secondary | ICD-10-CM

## 2019-11-12 LAB — CBC WITH DIFFERENTIAL/PLATELET
Basophils Absolute: 0.1 10*3/uL (ref 0.0–0.1)
Basophils Relative: 0.7 % (ref 0.0–3.0)
Eosinophils Absolute: 0.4 10*3/uL (ref 0.0–0.7)
Eosinophils Relative: 4.9 % (ref 0.0–5.0)
HCT: 29.5 % — ABNORMAL LOW (ref 39.0–52.0)
Hemoglobin: 9.9 g/dL — ABNORMAL LOW (ref 13.0–17.0)
Lymphocytes Relative: 10.7 % — ABNORMAL LOW (ref 12.0–46.0)
Lymphs Abs: 0.9 10*3/uL (ref 0.7–4.0)
MCHC: 33.5 g/dL (ref 30.0–36.0)
MCV: 96.8 fl (ref 78.0–100.0)
Monocytes Absolute: 0.8 10*3/uL (ref 0.1–1.0)
Monocytes Relative: 10.2 % (ref 3.0–12.0)
Neutro Abs: 6 10*3/uL (ref 1.4–7.7)
Neutrophils Relative %: 73.5 % (ref 43.0–77.0)
Platelets: 192 10*3/uL (ref 150.0–400.0)
RBC: 3.05 Mil/uL — ABNORMAL LOW (ref 4.22–5.81)
RDW: 15.3 % (ref 11.5–15.5)
WBC: 8.1 10*3/uL (ref 4.0–10.5)

## 2019-11-12 LAB — POCT HEMOGLOBIN-HEMACUE: Hemoglobin: 10.1 g/dL — ABNORMAL LOW (ref 13.0–17.0)

## 2019-11-12 LAB — COMPREHENSIVE METABOLIC PANEL
ALT: 20 U/L (ref 0–53)
AST: 23 U/L (ref 0–37)
Albumin: 3.7 g/dL (ref 3.5–5.2)
Alkaline Phosphatase: 200 U/L — ABNORMAL HIGH (ref 39–117)
BUN: 41 mg/dL — ABNORMAL HIGH (ref 6–23)
CO2: 23 mEq/L (ref 19–32)
Calcium: 8.7 mg/dL (ref 8.4–10.5)
Chloride: 99 mEq/L (ref 96–112)
Creatinine, Ser: 2.97 mg/dL — ABNORMAL HIGH (ref 0.40–1.50)
GFR: 19.9 mL/min — ABNORMAL LOW (ref >60.00)
Glucose, Bld: 178 mg/dL — ABNORMAL HIGH (ref 70–99)
Potassium: 4.7 mEq/L (ref 3.5–5.1)
Sodium: 130 mEq/L — ABNORMAL LOW (ref 135–145)
Total Bilirubin: 0.4 mg/dL (ref 0.2–1.2)
Total Protein: 7.3 g/dL (ref 6.0–8.3)

## 2019-11-12 MED ORDER — EPOETIN ALFA 10000 UNIT/ML IJ SOLN
INTRAMUSCULAR | Status: AC
  Filled 2019-11-12: qty 1

## 2019-11-12 MED ORDER — EPOETIN ALFA 10000 UNIT/ML IJ SOLN
10000.0000 [IU] | INTRAMUSCULAR | Status: DC
  Administered 2019-11-12: 10000 [IU] via SUBCUTANEOUS

## 2019-11-19 ENCOUNTER — Other Ambulatory Visit: Payer: Self-pay

## 2019-11-19 ENCOUNTER — Ambulatory Visit (HOSPITAL_COMMUNITY)
Admission: RE | Admit: 2019-11-19 | Discharge: 2019-11-19 | Disposition: A | Discharge status: Home / Self Care | Payer: Medicare Other | Source: Ambulatory Visit | Attending: Nephrology | Admitting: Nephrology

## 2019-11-19 VITALS — BP 144/66 | HR 66 | Temp 95.2°F | Resp 20

## 2019-11-19 DIAGNOSIS — D649 Anemia, unspecified: Secondary | ICD-10-CM

## 2019-11-19 DIAGNOSIS — D631 Anemia in chronic kidney disease: Secondary | ICD-10-CM | POA: Insufficient documentation

## 2019-11-19 DIAGNOSIS — N184 Chronic kidney disease, stage 4 (severe): Secondary | ICD-10-CM | POA: Insufficient documentation

## 2019-11-19 LAB — POCT HEMOGLOBIN-HEMACUE: Hemoglobin: 10.2 g/dL — ABNORMAL LOW (ref 13.0–17.0)

## 2019-11-19 MED ORDER — EPOETIN ALFA 10000 UNIT/ML IJ SOLN
10000.0000 [IU] | INTRAMUSCULAR | Status: DC
  Administered 2019-11-19: 10000 [IU] via SUBCUTANEOUS

## 2019-11-19 MED ORDER — EPOETIN ALFA 10000 UNIT/ML IJ SOLN
INTRAMUSCULAR | Status: AC
  Filled 2019-11-19: qty 1

## 2019-11-26 ENCOUNTER — Other Ambulatory Visit: Payer: Self-pay

## 2019-11-26 ENCOUNTER — Encounter: Payer: Self-pay | Admitting: Family

## 2019-11-26 ENCOUNTER — Encounter (HOSPITAL_COMMUNITY): Payer: Medicare Other

## 2019-11-26 ENCOUNTER — Encounter (HOSPITAL_COMMUNITY)
Admission: RE | Admit: 2019-11-26 | Discharge: 2019-11-26 | Disposition: A | Discharge status: Home / Self Care | Payer: Medicare Other | Source: Ambulatory Visit | Attending: Nephrology | Admitting: Nephrology

## 2019-11-26 VITALS — BP 158/60 | HR 69 | Temp 94.5°F | Resp 20

## 2019-11-26 DIAGNOSIS — D649 Anemia, unspecified: Secondary | ICD-10-CM

## 2019-11-26 DIAGNOSIS — N184 Chronic kidney disease, stage 4 (severe): Secondary | ICD-10-CM | POA: Diagnosis present

## 2019-11-26 DIAGNOSIS — D631 Anemia in chronic kidney disease: Secondary | ICD-10-CM | POA: Insufficient documentation

## 2019-11-26 LAB — IRON AND TIBC
Iron: 55 ug/dL (ref 45–182)
Saturation Ratios: 21 % (ref 17.9–39.5)
TIBC: 265 ug/dL (ref 250–450)
UIBC: 210 ug/dL

## 2019-11-26 LAB — FERRITIN: Ferritin: 320 ng/mL (ref 24–336)

## 2019-11-26 MED ORDER — EPOETIN ALFA 10000 UNIT/ML IJ SOLN
10000.0000 [IU] | INTRAMUSCULAR | Status: DC
  Administered 2019-11-26: 10000 [IU] via SUBCUTANEOUS

## 2019-11-26 MED ORDER — EPOETIN ALFA 10000 UNIT/ML IJ SOLN
INTRAMUSCULAR | Status: AC
  Filled 2019-11-26: qty 1

## 2019-11-27 LAB — POCT HEMOGLOBIN-HEMACUE: Hemoglobin: 10.3 g/dL — ABNORMAL LOW (ref 13.0–17.0)

## 2019-12-02 ENCOUNTER — Other Ambulatory Visit: Payer: Self-pay | Admitting: Family

## 2019-12-02 DIAGNOSIS — C221 Intrahepatic bile duct carcinoma: Secondary | ICD-10-CM

## 2019-12-03 ENCOUNTER — Ambulatory Visit (HOSPITAL_COMMUNITY)
Admission: RE | Admit: 2019-12-03 | Discharge: 2019-12-03 | Disposition: A | Discharge status: Home / Self Care | Payer: Medicare Other | Source: Ambulatory Visit | Attending: Nephrology | Admitting: Nephrology

## 2019-12-03 ENCOUNTER — Other Ambulatory Visit: Payer: Self-pay

## 2019-12-03 ENCOUNTER — Other Ambulatory Visit (INDEPENDENT_AMBULATORY_CARE_PROVIDER_SITE_OTHER): Payer: Medicare Other

## 2019-12-03 VITALS — BP 150/56 | HR 67 | Temp 95.8°F | Resp 20

## 2019-12-03 DIAGNOSIS — N184 Chronic kidney disease, stage 4 (severe): Secondary | ICD-10-CM | POA: Diagnosis present

## 2019-12-03 DIAGNOSIS — D649 Anemia, unspecified: Secondary | ICD-10-CM

## 2019-12-03 DIAGNOSIS — D631 Anemia in chronic kidney disease: Secondary | ICD-10-CM | POA: Insufficient documentation

## 2019-12-03 DIAGNOSIS — C221 Intrahepatic bile duct carcinoma: Secondary | ICD-10-CM

## 2019-12-03 LAB — CBC WITH DIFFERENTIAL/PLATELET
Basophils Absolute: 0.1 10*3/uL (ref 0.0–0.1)
Basophils Relative: 1.1 % (ref 0.0–3.0)
Eosinophils Absolute: 0.4 10*3/uL (ref 0.0–0.7)
Eosinophils Relative: 5.5 % — ABNORMAL HIGH (ref 0.0–5.0)
HCT: 32 % — ABNORMAL LOW (ref 39.0–52.0)
Hemoglobin: 10.6 g/dL — ABNORMAL LOW (ref 13.0–17.0)
Lymphocytes Relative: 15 % (ref 12.0–46.0)
Lymphs Abs: 1.1 10*3/uL (ref 0.7–4.0)
MCHC: 33.1 g/dL (ref 30.0–36.0)
MCV: 98.1 fl (ref 78.0–100.0)
Monocytes Absolute: 0.8 10*3/uL (ref 0.1–1.0)
Monocytes Relative: 10.4 % (ref 3.0–12.0)
Neutro Abs: 5.1 10*3/uL (ref 1.4–7.7)
Neutrophils Relative %: 68 % (ref 43.0–77.0)
Platelets: 202 10*3/uL (ref 150.0–400.0)
RBC: 3.27 Mil/uL — ABNORMAL LOW (ref 4.22–5.81)
RDW: 15.4 % (ref 11.5–15.5)
WBC: 7.5 10*3/uL (ref 4.0–10.5)

## 2019-12-03 LAB — POCT HEMOGLOBIN-HEMACUE: Hemoglobin: 10.6 g/dL — ABNORMAL LOW (ref 13.0–17.0)

## 2019-12-03 LAB — COMPREHENSIVE METABOLIC PANEL
ALT: 19 U/L (ref 0–53)
AST: 20 U/L (ref 0–37)
Albumin: 3.8 g/dL (ref 3.5–5.2)
Alkaline Phosphatase: 178 U/L — ABNORMAL HIGH (ref 39–117)
BUN: 40 mg/dL — ABNORMAL HIGH (ref 6–23)
CO2: 23 mEq/L (ref 19–32)
Calcium: 9.1 mg/dL (ref 8.4–10.5)
Chloride: 99 mEq/L (ref 96–112)
Creatinine, Ser: 3.13 mg/dL — ABNORMAL HIGH (ref 0.40–1.50)
GFR: 18.72 mL/min — ABNORMAL LOW (ref >60.00)
Glucose, Bld: 153 mg/dL — ABNORMAL HIGH (ref 70–99)
Potassium: 5 mEq/L (ref 3.5–5.1)
Sodium: 131 mEq/L — ABNORMAL LOW (ref 135–145)
Total Bilirubin: 0.4 mg/dL (ref 0.2–1.2)
Total Protein: 7.6 g/dL (ref 6.0–8.3)

## 2019-12-03 MED ORDER — EPOETIN ALFA 10000 UNIT/ML IJ SOLN
10000.0000 [IU] | INTRAMUSCULAR | Status: DC
  Administered 2019-12-03: 10000 [IU] via SUBCUTANEOUS

## 2019-12-03 MED ORDER — EPOETIN ALFA 10000 UNIT/ML IJ SOLN
INTRAMUSCULAR | Status: AC
  Filled 2019-12-03: qty 1

## 2019-12-09 ENCOUNTER — Encounter: Payer: Self-pay | Admitting: Family

## 2019-12-10 ENCOUNTER — Other Ambulatory Visit: Payer: Self-pay

## 2019-12-10 ENCOUNTER — Ambulatory Visit (HOSPITAL_COMMUNITY)
Admission: RE | Admit: 2019-12-10 | Discharge: 2019-12-10 | Disposition: A | Discharge status: Home / Self Care | Payer: Medicare Other | Source: Ambulatory Visit | Attending: Nephrology | Admitting: Nephrology

## 2019-12-10 VITALS — BP 136/65 | HR 71 | Temp 97.8°F | Resp 20

## 2019-12-10 DIAGNOSIS — D631 Anemia in chronic kidney disease: Secondary | ICD-10-CM | POA: Diagnosis not present

## 2019-12-10 DIAGNOSIS — D649 Anemia, unspecified: Secondary | ICD-10-CM

## 2019-12-10 DIAGNOSIS — N184 Chronic kidney disease, stage 4 (severe): Secondary | ICD-10-CM | POA: Insufficient documentation

## 2019-12-10 LAB — POCT HEMOGLOBIN-HEMACUE: Hemoglobin: 10.4 g/dL — ABNORMAL LOW (ref 13.0–17.0)

## 2019-12-10 MED ORDER — EPOETIN ALFA 10000 UNIT/ML IJ SOLN
INTRAMUSCULAR | Status: AC
  Administered 2019-12-10: 10000 [IU] via SUBCUTANEOUS
  Filled 2019-12-10: qty 1

## 2019-12-10 MED ORDER — EPOETIN ALFA 10000 UNIT/ML IJ SOLN: 10000.0000 [IU] | INTRAMUSCULAR | Status: DC

## 2019-12-15 ENCOUNTER — Other Ambulatory Visit: Payer: Self-pay | Admitting: Family

## 2019-12-17 ENCOUNTER — Ambulatory Visit (HOSPITAL_COMMUNITY)
Admission: RE | Admit: 2019-12-17 | Discharge: 2019-12-17 | Disposition: A | Discharge status: Home / Self Care | Payer: Medicare Other | Source: Ambulatory Visit | Attending: Nephrology | Admitting: Nephrology

## 2019-12-17 ENCOUNTER — Other Ambulatory Visit: Payer: Self-pay

## 2019-12-17 VITALS — BP 160/59 | HR 68 | Temp 95.3°F | Resp 20

## 2019-12-17 DIAGNOSIS — D631 Anemia in chronic kidney disease: Secondary | ICD-10-CM | POA: Diagnosis not present

## 2019-12-17 DIAGNOSIS — N184 Chronic kidney disease, stage 4 (severe): Secondary | ICD-10-CM | POA: Insufficient documentation

## 2019-12-17 DIAGNOSIS — D649 Anemia, unspecified: Secondary | ICD-10-CM

## 2019-12-17 LAB — POCT HEMOGLOBIN-HEMACUE: Hemoglobin: 10.9 g/dL — ABNORMAL LOW (ref 13.0–17.0)

## 2019-12-17 MED ORDER — EPOETIN ALFA 10000 UNIT/ML IJ SOLN
10000.0000 [IU] | INTRAMUSCULAR | Status: DC
  Administered 2019-12-17: 10000 [IU] via SUBCUTANEOUS

## 2019-12-17 MED ORDER — EPOETIN ALFA 10000 UNIT/ML IJ SOLN
INTRAMUSCULAR | Status: AC
  Filled 2019-12-17: qty 1

## 2019-12-24 ENCOUNTER — Other Ambulatory Visit: Payer: Self-pay

## 2019-12-24 ENCOUNTER — Encounter: Payer: Self-pay | Admitting: Family

## 2019-12-24 ENCOUNTER — Ambulatory Visit (HOSPITAL_COMMUNITY)
Admission: RE | Admit: 2019-12-24 | Discharge: 2019-12-24 | Disposition: A | Discharge status: Home / Self Care | Payer: Medicare Other | Source: Ambulatory Visit | Attending: Nephrology | Admitting: Nephrology

## 2019-12-24 VITALS — BP 147/61 | HR 68 | Temp 95.3°F | Resp 20

## 2019-12-24 DIAGNOSIS — D631 Anemia in chronic kidney disease: Secondary | ICD-10-CM | POA: Diagnosis not present

## 2019-12-24 DIAGNOSIS — D649 Anemia, unspecified: Secondary | ICD-10-CM

## 2019-12-24 DIAGNOSIS — N184 Chronic kidney disease, stage 4 (severe): Secondary | ICD-10-CM | POA: Insufficient documentation

## 2019-12-24 LAB — FERRITIN: Ferritin: 174 ng/mL (ref 24–336)

## 2019-12-24 LAB — IRON AND TIBC
Iron: 55 ug/dL (ref 45–182)
Saturation Ratios: 20 % (ref 17.9–39.5)
TIBC: 277 ug/dL (ref 250–450)
UIBC: 222 ug/dL

## 2019-12-24 LAB — POCT HEMOGLOBIN-HEMACUE: Hemoglobin: 10.9 g/dL — ABNORMAL LOW (ref 13.0–17.0)

## 2019-12-24 MED ORDER — EPOETIN ALFA 10000 UNIT/ML IJ SOLN: 10000.0000 [IU] | INTRAMUSCULAR | Status: DC

## 2019-12-24 MED ORDER — EPOETIN ALFA 10000 UNIT/ML IJ SOLN
INTRAMUSCULAR | Status: AC
  Administered 2019-12-24: 10000 [IU] via SUBCUTANEOUS
  Filled 2019-12-24: qty 1

## 2019-12-31 ENCOUNTER — Other Ambulatory Visit: Payer: Self-pay

## 2019-12-31 ENCOUNTER — Encounter (HOSPITAL_COMMUNITY)
Admission: RE | Admit: 2019-12-31 | Discharge: 2019-12-31 | Disposition: A | Discharge status: Home / Self Care | Payer: Medicare Other | Source: Ambulatory Visit | Attending: Nephrology | Admitting: Nephrology

## 2019-12-31 VITALS — BP 172/64 | HR 70 | Resp 20

## 2019-12-31 DIAGNOSIS — D631 Anemia in chronic kidney disease: Secondary | ICD-10-CM | POA: Diagnosis not present

## 2019-12-31 DIAGNOSIS — N184 Chronic kidney disease, stage 4 (severe): Secondary | ICD-10-CM | POA: Diagnosis not present

## 2019-12-31 DIAGNOSIS — D649 Anemia, unspecified: Secondary | ICD-10-CM

## 2019-12-31 LAB — POCT HEMOGLOBIN-HEMACUE: Hemoglobin: 12.3 g/dL — ABNORMAL LOW (ref 13.0–17.0)

## 2019-12-31 MED ORDER — EPOETIN ALFA 10000 UNIT/ML IJ SOLN: 10000.0000 [IU] | INTRAMUSCULAR | Status: DC

## 2019-12-31 MED ORDER — EPOETIN ALFA 10000 UNIT/ML IJ SOLN
INTRAMUSCULAR | Status: AC
  Filled 2019-12-31: qty 1

## 2020-01-03 ENCOUNTER — Other Ambulatory Visit: Payer: Self-pay | Admitting: Family

## 2020-01-07 ENCOUNTER — Encounter (HOSPITAL_COMMUNITY): Payer: Medicare Other

## 2020-01-09 ENCOUNTER — Encounter: Payer: Self-pay | Admitting: Family

## 2020-01-10 ENCOUNTER — Other Ambulatory Visit: Payer: Self-pay | Admitting: Family

## 2020-01-10 DIAGNOSIS — C221 Intrahepatic bile duct carcinoma: Secondary | ICD-10-CM

## 2020-01-14 ENCOUNTER — Other Ambulatory Visit: Payer: Self-pay

## 2020-01-14 ENCOUNTER — Encounter (HOSPITAL_COMMUNITY)
Admission: RE | Admit: 2020-01-14 | Discharge: 2020-01-14 | Disposition: A | Discharge status: Home / Self Care | Payer: Medicare Other | Source: Ambulatory Visit | Attending: Nephrology | Admitting: Nephrology

## 2020-01-14 ENCOUNTER — Other Ambulatory Visit (INDEPENDENT_AMBULATORY_CARE_PROVIDER_SITE_OTHER): Payer: Medicare Other

## 2020-01-14 VITALS — BP 167/65 | HR 69 | Temp 96.1°F | Resp 20

## 2020-01-14 DIAGNOSIS — C221 Intrahepatic bile duct carcinoma: Secondary | ICD-10-CM

## 2020-01-14 DIAGNOSIS — D649 Anemia, unspecified: Secondary | ICD-10-CM

## 2020-01-14 DIAGNOSIS — N184 Chronic kidney disease, stage 4 (severe): Secondary | ICD-10-CM | POA: Diagnosis not present

## 2020-01-14 LAB — COMPREHENSIVE METABOLIC PANEL
ALT: 32 U/L (ref 0–53)
AST: 39 U/L — ABNORMAL HIGH (ref 0–37)
Albumin: 3.6 g/dL (ref 3.5–5.2)
Alkaline Phosphatase: 226 U/L — ABNORMAL HIGH (ref 39–117)
BUN: 51 mg/dL — ABNORMAL HIGH (ref 6–23)
CO2: 22 mEq/L (ref 19–32)
Calcium: 8.8 mg/dL (ref 8.4–10.5)
Chloride: 99 mEq/L (ref 96–112)
Creatinine, Ser: 3.46 mg/dL — ABNORMAL HIGH (ref 0.40–1.50)
GFR: 16.68 mL/min — ABNORMAL LOW (ref >60.00)
Glucose, Bld: 176 mg/dL — ABNORMAL HIGH (ref 70–99)
Potassium: 5.2 mEq/L — ABNORMAL HIGH (ref 3.5–5.1)
Sodium: 131 mEq/L — ABNORMAL LOW (ref 135–145)
Total Bilirubin: 0.6 mg/dL (ref 0.2–1.2)
Total Protein: 7.5 g/dL (ref 6.0–8.3)

## 2020-01-14 LAB — CBC WITH DIFFERENTIAL/PLATELET
Basophils Absolute: 0.1 10*3/uL (ref 0.0–0.1)
Basophils Relative: 0.8 % (ref 0.0–3.0)
Eosinophils Absolute: 0.4 10*3/uL (ref 0.0–0.7)
Eosinophils Relative: 4.9 % (ref 0.0–5.0)
HCT: 31 % — ABNORMAL LOW (ref 39.0–52.0)
Hemoglobin: 10.3 g/dL — ABNORMAL LOW (ref 13.0–17.0)
Lymphocytes Relative: 11.4 % — ABNORMAL LOW (ref 12.0–46.0)
Lymphs Abs: 1 10*3/uL (ref 0.7–4.0)
MCHC: 33.2 g/dL (ref 30.0–36.0)
MCV: 96.1 fl (ref 78.0–100.0)
Monocytes Absolute: 0.7 10*3/uL (ref 0.1–1.0)
Monocytes Relative: 8.5 % (ref 3.0–12.0)
Neutro Abs: 6.5 10*3/uL (ref 1.4–7.7)
Neutrophils Relative %: 74.4 % (ref 43.0–77.0)
Platelets: 222 10*3/uL (ref 150.0–400.0)
RBC: 3.23 Mil/uL — ABNORMAL LOW (ref 4.22–5.81)
RDW: 14.7 % (ref 11.5–15.5)
WBC: 8.7 10*3/uL (ref 4.0–10.5)

## 2020-01-14 LAB — POCT HEMOGLOBIN-HEMACUE: Hemoglobin: 9.8 g/dL — ABNORMAL LOW (ref 13.0–17.0)

## 2020-01-14 MED ORDER — EPOETIN ALFA 10000 UNIT/ML IJ SOLN
INTRAMUSCULAR | Status: AC
  Filled 2020-01-14: qty 1

## 2020-01-14 MED ORDER — EPOETIN ALFA 10000 UNIT/ML IJ SOLN
10000.0000 [IU] | INTRAMUSCULAR | Status: DC
  Administered 2020-01-14: 10000 [IU] via SUBCUTANEOUS

## 2020-01-16 ENCOUNTER — Encounter: Payer: Self-pay | Admitting: Family

## 2020-01-17 ENCOUNTER — Other Ambulatory Visit: Payer: Self-pay | Admitting: Family

## 2020-01-17 DIAGNOSIS — C221 Intrahepatic bile duct carcinoma: Secondary | ICD-10-CM

## 2020-01-20 ENCOUNTER — Other Ambulatory Visit (HOSPITAL_COMMUNITY): Payer: Self-pay | Admitting: *Deleted

## 2020-01-20 ENCOUNTER — Encounter: Payer: Self-pay | Admitting: Family

## 2020-01-21 ENCOUNTER — Encounter: Payer: Self-pay | Admitting: Family

## 2020-01-21 ENCOUNTER — Encounter (HOSPITAL_COMMUNITY): Payer: Medicare Other

## 2020-01-21 ENCOUNTER — Emergency Department (HOSPITAL_COMMUNITY): Payer: Medicare Other

## 2020-01-21 ENCOUNTER — Other Ambulatory Visit: Payer: Self-pay

## 2020-01-21 ENCOUNTER — Emergency Department (HOSPITAL_COMMUNITY)
Admission: EM | Admit: 2020-01-21 | Discharge: 2020-01-22 | Payer: Medicare Other | Attending: Emergency Medicine | Admitting: Emergency Medicine

## 2020-01-21 ENCOUNTER — Telehealth: Payer: Self-pay

## 2020-01-21 ENCOUNTER — Ambulatory Visit: Payer: Medicare Other | Admitting: Family

## 2020-01-21 ENCOUNTER — Encounter (HOSPITAL_COMMUNITY): Payer: Self-pay | Admitting: Emergency Medicine

## 2020-01-21 ENCOUNTER — Other Ambulatory Visit (INDEPENDENT_AMBULATORY_CARE_PROVIDER_SITE_OTHER): Payer: Medicare Other

## 2020-01-21 VITALS — BP 128/62 | HR 77 | Temp 99.5°F | Ht 68.0 in | Wt 142.8 lb

## 2020-01-21 DIAGNOSIS — R945 Abnormal results of liver function studies: Secondary | ICD-10-CM | POA: Diagnosis present

## 2020-01-21 DIAGNOSIS — I2510 Atherosclerotic heart disease of native coronary artery without angina pectoris: Secondary | ICD-10-CM | POA: Insufficient documentation

## 2020-01-21 DIAGNOSIS — N184 Chronic kidney disease, stage 4 (severe): Secondary | ICD-10-CM | POA: Diagnosis not present

## 2020-01-21 DIAGNOSIS — Z7984 Long term (current) use of oral hypoglycemic drugs: Secondary | ICD-10-CM | POA: Insufficient documentation

## 2020-01-21 DIAGNOSIS — Z20822 Contact with and (suspected) exposure to covid-19: Secondary | ICD-10-CM | POA: Diagnosis not present

## 2020-01-21 DIAGNOSIS — I129 Hypertensive chronic kidney disease with stage 1 through stage 4 chronic kidney disease, or unspecified chronic kidney disease: Secondary | ICD-10-CM | POA: Insufficient documentation

## 2020-01-21 DIAGNOSIS — Z87891 Personal history of nicotine dependence: Secondary | ICD-10-CM | POA: Insufficient documentation

## 2020-01-21 DIAGNOSIS — R17 Unspecified jaundice: Secondary | ICD-10-CM | POA: Diagnosis not present

## 2020-01-21 DIAGNOSIS — E1122 Type 2 diabetes mellitus with diabetic chronic kidney disease: Secondary | ICD-10-CM | POA: Insufficient documentation

## 2020-01-21 DIAGNOSIS — Z7901 Long term (current) use of anticoagulants: Secondary | ICD-10-CM | POA: Diagnosis not present

## 2020-01-21 DIAGNOSIS — C221 Intrahepatic bile duct carcinoma: Secondary | ICD-10-CM

## 2020-01-21 DIAGNOSIS — E871 Hypo-osmolality and hyponatremia: Secondary | ICD-10-CM | POA: Diagnosis not present

## 2020-01-21 DIAGNOSIS — Z79899 Other long term (current) drug therapy: Secondary | ICD-10-CM | POA: Insufficient documentation

## 2020-01-21 LAB — BASIC METABOLIC PANEL
Anion gap: 11 (ref 5–15)
BUN: 43 mg/dL — ABNORMAL HIGH (ref 8–23)
CO2: 20 mmol/L — ABNORMAL LOW (ref 22–32)
Calcium: 8.7 mg/dL — ABNORMAL LOW (ref 8.9–10.3)
Chloride: 96 mmol/L — ABNORMAL LOW (ref 98–111)
Creatinine, Ser: 3.3 mg/dL — ABNORMAL HIGH (ref 0.61–1.24)
GFR calc Af Amer: 18 mL/min — ABNORMAL LOW (ref >60)
GFR calc non Af Amer: 15 mL/min — ABNORMAL LOW (ref >60)
Glucose, Bld: 227 mg/dL — ABNORMAL HIGH (ref 70–99)
Potassium: 5.4 mmol/L — ABNORMAL HIGH (ref 3.5–5.1)
Sodium: 127 mmol/L — ABNORMAL LOW (ref 135–145)

## 2020-01-21 LAB — COMPREHENSIVE METABOLIC PANEL
ALT: 74 U/L — ABNORMAL HIGH (ref 0–53)
AST: 93 U/L — ABNORMAL HIGH (ref 0–37)
Albumin: 3.7 g/dL (ref 3.5–5.2)
Alkaline Phosphatase: 236 U/L — ABNORMAL HIGH (ref 39–117)
BUN: 44 mg/dL — ABNORMAL HIGH (ref 6–23)
CO2: 22 mEq/L (ref 19–32)
Calcium: 8.8 mg/dL (ref 8.4–10.5)
Chloride: 92 mEq/L — ABNORMAL LOW (ref 96–112)
Creatinine, Ser: 3.21 mg/dL — ABNORMAL HIGH (ref 0.40–1.50)
GFR: 18.18 mL/min — ABNORMAL LOW (ref >60.00)
Glucose, Bld: 349 mg/dL — ABNORMAL HIGH (ref 70–99)
Potassium: 5.4 mEq/L — ABNORMAL HIGH (ref 3.5–5.1)
Sodium: 122 mEq/L — ABNORMAL LOW (ref 135–145)
Total Bilirubin: 1.8 mg/dL — ABNORMAL HIGH (ref 0.2–1.2)
Total Protein: 7.3 g/dL (ref 6.0–8.3)

## 2020-01-21 LAB — LIPASE, BLOOD: Lipase: 31 U/L (ref 11–51)

## 2020-01-21 LAB — CBC WITH DIFFERENTIAL/PLATELET
Abs Immature Granulocytes: 0.07 10*3/uL (ref 0.00–0.07)
Basophils Absolute: 0.1 10*3/uL (ref 0.0–0.1)
Basophils Absolute: 0 10*3/uL (ref 0.0–0.1)
Basophils Relative: 0.5 % (ref 0.0–3.0)
Basophils Relative: 0 %
Eosinophils Absolute: 0 10*3/uL (ref 0.0–0.7)
Eosinophils Absolute: 0 10*3/uL (ref 0.0–0.5)
Eosinophils Relative: 0.1 % (ref 0.0–5.0)
Eosinophils Relative: 0 %
HCT: 29.4 % — ABNORMAL LOW (ref 39.0–52.0)
HCT: 31.2 % — ABNORMAL LOW (ref 39.0–52.0)
Hemoglobin: 9.6 g/dL — ABNORMAL LOW (ref 13.0–17.0)
Hemoglobin: 10.4 g/dL — ABNORMAL LOW (ref 13.0–17.0)
Immature Granulocytes: 1 %
Lymphocytes Relative: 3 % — ABNORMAL LOW (ref 12.0–46.0)
Lymphocytes Relative: 3 %
Lymphs Abs: 0.4 10*3/uL — ABNORMAL LOW (ref 0.7–4.0)
Lymphs Abs: 0.4 10*3/uL — ABNORMAL LOW (ref 0.7–4.0)
MCH: 32.2 pg (ref 26.0–34.0)
MCHC: 32.7 g/dL (ref 30.0–36.0)
MCHC: 33.3 g/dL (ref 30.0–36.0)
MCV: 96.6 fl (ref 78.0–100.0)
MCV: 96.6 fL (ref 80.0–100.0)
Monocytes Absolute: 0.7 10*3/uL (ref 0.1–1.0)
Monocytes Absolute: 0.9 10*3/uL (ref 0.1–1.0)
Monocytes Relative: 6.1 % (ref 3.0–12.0)
Monocytes Relative: 7 %
Neutro Abs: 10.6 10*3/uL — ABNORMAL HIGH (ref 1.4–7.7)
Neutro Abs: 10.7 10*3/uL — ABNORMAL HIGH (ref 1.7–7.7)
Neutrophils Relative %: 90.3 % — ABNORMAL HIGH (ref 43.0–77.0)
Neutrophils Relative %: 89 %
Platelets: 152 10*3/uL (ref 150.0–400.0)
Platelets: 138 10*3/uL — ABNORMAL LOW (ref 150–400)
RBC: 3.04 Mil/uL — ABNORMAL LOW (ref 4.22–5.81)
RBC: 3.23 MIL/uL — ABNORMAL LOW (ref 4.22–5.81)
RDW: 15.3 % (ref 11.5–15.5)
RDW: 14.3 % (ref 11.5–15.5)
WBC: 11.7 10*3/uL — ABNORMAL HIGH (ref 4.0–10.5)
WBC: 12.1 10*3/uL — ABNORMAL HIGH (ref 4.0–10.5)
nRBC: 0 % (ref 0.0–0.2)

## 2020-01-21 LAB — SARS CORONAVIRUS 2 (TAT 6-24 HRS): SARS Coronavirus 2: NEGATIVE

## 2020-01-21 LAB — HEPATIC FUNCTION PANEL
ALT: 89 U/L — ABNORMAL HIGH (ref 0–44)
AST: 104 U/L — ABNORMAL HIGH (ref 15–41)
Albumin: 3.1 g/dL — ABNORMAL LOW (ref 3.5–5.0)
Alkaline Phosphatase: 227 U/L — ABNORMAL HIGH (ref 38–126)
Bilirubin, Direct: 1.6 mg/dL — ABNORMAL HIGH (ref 0.0–0.2)
Indirect Bilirubin: 0.8 mg/dL (ref 0.3–0.9)
Total Bilirubin: 2.4 mg/dL — ABNORMAL HIGH (ref 0.3–1.2)
Total Protein: 7.4 g/dL (ref 6.5–8.1)

## 2020-01-21 LAB — TSH: TSH: 1.99 u[IU]/mL (ref 0.350–4.500)

## 2020-01-21 MED ORDER — CIPROFLOXACIN IN D5W 400 MG/200ML IV SOLN
400.0000 mg | Freq: Once | INTRAVENOUS | Status: AC
  Administered 2020-01-21: 400 mg via INTRAVENOUS
  Filled 2020-01-21: qty 200

## 2020-01-21 MED ORDER — METRONIDAZOLE IN NACL 5-0.79 MG/ML-% IV SOLN
500.0000 mg | Freq: Once | INTRAVENOUS | Status: AC
  Administered 2020-01-21: 500 mg via INTRAVENOUS
  Filled 2020-01-21: qty 100

## 2020-01-21 MED ORDER — LACTATED RINGERS IV BOLUS: 1000.0000 mL | Freq: Once | INTRAVENOUS | Status: DC

## 2020-01-21 MED ORDER — SODIUM CHLORIDE 0.9 % IV BOLUS
1000.0000 mL | Freq: Once | INTRAVENOUS | Status: AC
  Administered 2020-01-21: 1000 mL via INTRAVENOUS

## 2020-01-21 NOTE — Progress Notes (Signed)
Gerald Jacobs is a 86 y.o. male with the following history as recorded in EpicCare:  Patient Active Problem List   Diagnosis Date Noted  . Atrial fibrillation ([place]) 10/04/2019  . 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 Liver mass   . Hyperbilirubinemia 06/15/2017  . Essential hypertension 09/02/2016  . Statin myopathy 07/28/2016  . Normocytic anemia 06/23/2016  . Medicare annual wellness visit, subsequent 01/18/2016  . Routine general medical examination at a health care facility 01/18/2016  . Frequent PVCs 11/24/2014  . Intermittent palpitations 07/23/2014  . Chronic cholecystitis with calculus 02/04/2014  . Choledocholithiasis 08/28/2013  . Symptomatic cholelithiasis 06/04/2013  . Elevated LFTs 06/04/2013  . Benign hypertensive heart disease without heart failure 09/07/2011  . Type 2 diabetes mellitus with stage 4 chronic kidney disease, without long-term current use of insulin ([place]) 09/07/2011  . Dyslipidemia 09/07/2011  . CKD (chronic kidney disease), stage IV ([place]) 09/07/2011    Current Outpatient Medications  Medication Sig Dispense Refill  . allopurinol (ZYLOPRIM) 100 MG tablet TAKE 1 TABLET BY MOUTH EVERY DAY 90 tablet 0  . amLODipine (NORVASC) 5 MG tablet TAKE 1 TABLET BY MOUTH EVERY DAY 90 tablet 1  . apixaban (ELIQUIS) 2.5 MG TABS tablet Take 1 tablet (2.5 mg total) by mouth 2 (two) times daily. 180 tablet 1  . Biotin 1000 MCG tablet Take 1,000 mcg by mouth daily.    . cholecalciferol (VITAMIN D) 1000 units tablet Take 2,000 Units by mouth daily.     . diphenhydramine-acetaminophen (TYLENOL PM) 25-500 MG TABS tablet Take 1 tablet by mouth at bedtime as needed (pain).    Marland Kitchen epoetin alfa (EPOGEN,PROCRIT) 16109 UNIT/ML injection Inject 10,000 Units into the vein once a week.    Marland Kitchen glucose blood (ONE TOUCH ULTRA TEST) test strip USE ONE STRIP PER TEST. CHECK BLOOD SUGARS 1-4 TIMES PER DAY AS INSTRUCTED. 100 each 4  . JANUVIA 50 MG tablet TAKE 1 TABLET BY MOUTH  EVERY DAY 90 tablet 1  . metoprolol succinate (TOPROL-XL) 50 MG 24 hr tablet Take 1 tablet (50 mg total) by mouth 2 (two) times daily. Take with or immediately following a meal. 180 tablet 3  . mupirocin ointment (BACTROBAN) 2 % Apply 1 application topically 2 (two) times daily. 22 g 0  . ondansetron (ZOFRAN) 8 MG tablet Take by mouth every 8 (eight) hours as needed for nausea or vomiting.    Glory Rosebush Delica Lancets 60A MISC TEST BLOOD SUGARS 1-4 TIMES A DAY AS INSTRUCTED. DX CODE: E11.9 100 each 3  . promethazine (PHENERGAN) 12.5 MG tablet Take 12.5 mg by mouth every 6 (six) hours as needed for nausea or vomiting.    . protein supplement shake (PREMIER PROTEIN) LIQD Take 2 oz by mouth daily.    . sodium chloride (SALINE MIST) 0.65 % nasal spray Place into the nose.    . triamcinolone (KENALOG) 0.025 % cream Apply 1 application topically 2 (two) times daily as needed (itching).     . triamcinolone (KENALOG) 0.1 % paste Use as directed 1 application in the mouth or throat 2 (two) times daily.     . Omega-3 Fatty Acids (FISH OIL) 1000 MG CAPS Take by mouth.    . pantoprazole (PROTONIX) 40 MG tablet Take 40 mg by mouth daily as needed (indigestion).     . triamcinolone cream (KENALOG) 0.1 % APPLY TO THE AFFECTED AREAS TWICE DAILY AS NEEDED     No current facility-administered medications for this visit.    Allergies:  Amoxicillin-pot clavulanate, Colchicine, Flexeril [cyclobenzaprine hcl], Levaquin [levofloxacin in d5w], Percocet [oxycodone-acetaminophen], Zebeta, and Bisoprolol fumarate  Past Medical History:  Diagnosis Date  . Bilateral shoulder pain 07/28/2016  . Cancer [facility])    prostate  . Chest pain 10/27/2016  . Chronic anemia    With normal iron studies and normal serum protein electrophoresis  . Coronary artery disease    Mild  . Cough 10/27/2016  . Diabetes mellitus    Adult onset  . Essential hypertension   . GERD (gastroesophageal reflux disease)    occ tums  . History of  gout   . Hyperbilirubinemia 06/15/2017  . Hyponatremia 06/15/2017  . Intermittent palpitations 07/23/2014  . Liver mass   . Mass of bile duct 06/27/2017  . Mild renal insufficiency   . Nocturia    x2  . Normocytic anemia 06/23/2016  . Osteoarthritis   . Sciatica of right side 07/28/2016  . Statin myopathy 07/28/2016  . Weight loss 06/15/2017    Past Surgical History:  Procedure Laterality Date  . CHOLECYSTECTOMY  02/04/2014   DR Zella Richer  . CHOLECYSTECTOMY N/A 02/04/2014   Procedure: LAPAROSCOPIC CHOLECYSTECTOMY with intraoperative cholangiogram;  Surgeon: Odis Hollingshead, MD;  Location: [facility];  Service: General;  Laterality: N/A;  . ERCP N/A 10/15/2013   Procedure: ENDOSCOPIC RETROGRADE CHOLANGIOPANCREATOGRAPHY (ERCP);  Surgeon: Jeryl Columbia, MD;  Location[facility];  Service: Endoscopy;  Laterality: N/A;  . KNEE SURGERY Right 1979  . melanoma surgery  1970's   on scalp  . PROSTATE SURGERY  1993   Prostate Cancer  . SPHINCTEROTOMY  10/15/2013   Procedure: SPHINCTEROTOMY;  Surgeon: Jeryl Columbia, MD;  Location: [facility];  Service: Endoscopy;;    Family History  Problem Relation Age of Onset  . Stroke Father   . Stroke Mother   . Stroke Sister   . Stroke Brother   . Stroke Brother   . Stroke Brother     Social History   Tobacco Use  . Smoking status: Former Smoker    Packs/day: 1.00    Years: 45.00    Pack years: 45.00    Types: Cigarettes    Quit date: 09/07/1979    Years since quitting: 40.4  . Smokeless tobacco: Never Used  Substance Use Topics  . Alcohol use: Yes    Comment: beer rarely    Subjective:  Accompanied by his daughter today; has been having increased nausea/ "feeling cold" for the past 24 hours; under care of [facility] for intrahepatic cholcangiocarinoma; Notes that blood pressure was elevated when he saw his nephrologist yesterday;  Objective:  Vitals:   01/21/20 1303  BP: 128/62  Pulse: 77  Temp: 99.5 F (37.5 C)  TempSrc: Oral  SpO2:  97%  Weight: 142 lb 12.8 oz (64.8 kg)  Height: 5\' 8"  (1.727 m)    General: Well developed, well nourished, in no acute distress; weak appearing Skin : Warm and dry. Jaundice appearing;  Head: Normocephalic and atraumatic  Lungs: Respirations unlabored;  Neurologic: Alert and oriented; speech intact; face symmetrical; moves all extremities well; CNII-XII intact without focal deficit   Assessment:  1. Intrahepatic cholangiocarcinoma ([place])   2. Hyponatremia   3. Jaundice   4. Type 2 diabetes mellitus with stage 4 chronic kidney disease, without long-term current use of insulin ([facility])     Plan:  Due to patient's appearance and critically low sodium level on labs, patient is sent to ER for further evaluation; patient and daughter are in agreement with  this treatment plan; Patient's daughter will reach out to oncologist at [facility] to determine how to proceed with follow-up/ repeat labs;  I called the charge nurse at [facility] to notify of patient's condition;  This visit occurred during the SARS-CoV-2 public health emergency.  Safety protocols were in place, including screening questions prior to the visit, additional usage of staff PPE, and extensive cleaning of exam room while observing appropriate contact time as indicated for disinfecting solutions.     No follow-ups on file.  No orders of the defined types were placed in this encounter.   Requested Prescriptions    No prescriptions requested or ordered in this encounter

## 2020-01-21 NOTE — Telephone Encounter (Signed)
New message   Please advise   Daughter [name] calling   Virtual visit was decline aware of screening questions.   Daughter Carmen's husband's Physician stating the patient needs to be seen in the office.   The patient C/o nausea & cold took Zofran at 6 am, does not feel right.  B/p 170/? @ Dr. Serita Grit office yesterday.    Second vaccine complete on  01/02/20

## 2020-01-21 NOTE — ED Notes (Signed)
Per [name], coming to ED for a Na 122, jaundice-history of liver cancer-treatment at [facility]

## 2020-01-21 NOTE — ED Provider Notes (Signed)
[facility] DEPT Provider Note   CSN: [id number] Arrival date & time: 01/21/20  1344     History Chief Complaint  Patient presents with  . Abnormal Lab    Gerald Jacobs is a 86 y.o. male with a history of cholangiocarcinoma s/p palliative radiation therapy, on chemo and no plans for surgery.  As result of his cholangiocarcinoma he has had hyperbilirubinemia, elevated LFTs as well as anemia.   HPI  Patient presents today from gastroenterologist today due to newly elevated bilirubinemia.   Patient states progressed to 3 weeks he has been feeling and was seen by nephrologist recommended radiation IV fluids will try to set this up and was able to.  He states that he is followed by a GI doctor at [facility] for his cholangiocarcinoma.  He states that he has had stents placed in the past 2 to obstructions secondary to his liver masses.  He states that he has been taking Zofran for nausea which works very well.  He states however that he has had decreased appetite over the past few weeks.  He denies any abdominal pain, changes with his stools, urinary difficulty, nausea or vomiting but states that he is feeling significantly more weak over the past several days.   He denies any cough, congestion, body aches, fevers.  Denies any headache or dizziness.   He states he has not noticed any increasing jaundice.  However he states that his daughter might be more aware of any changes such as skin color changes.   Deatra Robinson (daughter) Has talked to [facility] oncology. Follow up is established and oncologist will make sure patient is seen within the week.  I have followed up with patient's daughter after CT imaging confirmed that there is a possibility of failure of biliary stenting.  She is understanding of his need for hospitalization and is requesting that he be sent to [facility] for his care.     Past Medical History:  Diagnosis Date  . Bilateral shoulder  pain 07/28/2016  . Cancer [facility])    prostate  . Chest pain 10/27/2016  . Chronic anemia    With normal iron studies and normal serum protein electrophoresis  . Coronary artery disease    Mild  . Cough 10/27/2016  . Diabetes mellitus    Adult onset  . Essential hypertension   . GERD (gastroesophageal reflux disease)    occ tums  . History of gout   . Hyperbilirubinemia 06/15/2017  . Hyponatremia 06/15/2017  . Intermittent palpitations 07/23/2014  . Liver mass   . Mass of bile duct 06/27/2017  . Mild renal insufficiency   . Nocturia    x2  . Normocytic anemia 06/23/2016  . Osteoarthritis   . Sciatica of right side 07/28/2016  . Statin myopathy 07/28/2016  . Weight loss 06/15/2017    Patient Active Problem List   Diagnosis Date Noted  . Atrial fibrillation ([place]) 10/04/2019  . Intrahepatic cholangiocarcinoma ([place]) 06/27/2017  . Liver mass   . Hyperbilirubinemia 06/15/2017  . Essential hypertension 09/02/2016  . Statin myopathy 07/28/2016  . Normocytic anemia 06/23/2016  . Medicare annual wellness visit, subsequent 01/18/2016  . Routine general medical examination at a health care facility 01/18/2016  . Frequent PVCs 11/24/2014  . Intermittent palpitations 07/23/2014  . Chronic cholecystitis with calculus 02/04/2014  . Choledocholithiasis 08/28/2013  . Symptomatic cholelithiasis 06/04/2013  . Elevated LFTs 06/04/2013  . Benign hypertensive heart disease without heart failure 09/07/2011  . Type 2  diabetes mellitus with stage 4 chronic kidney disease, without long-term current use of insulin ([place]) 09/07/2011  . Dyslipidemia 09/07/2011  . CKD (chronic kidney disease), stage IV ([place]) 09/07/2011    Past Surgical History:  Procedure Laterality Date  . CHOLECYSTECTOMY  02/04/2014   DR Zella Richer  . CHOLECYSTECTOMY N/A 02/04/2014   Procedure: LAPAROSCOPIC CHOLECYSTECTOMY with intraoperative cholangiogram;  Surgeon: Odis Hollingshead, MD;  Location: [facility];  Service: General;   Laterality: N/A;  . ERCP N/A 10/15/2013   Procedure: ENDOSCOPIC RETROGRADE CHOLANGIOPANCREATOGRAPHY (ERCP);  Surgeon: Jeryl Columbia, MD;  Location: [facility];  Service: Endoscopy;  Laterality: N/A;  . KNEE SURGERY Right 1979  . melanoma surgery  1970's   on scalp  . PROSTATE SURGERY  1993   Prostate Cancer  . SPHINCTEROTOMY  10/15/2013   Procedure: SPHINCTEROTOMY;  Surgeon: Jeryl Columbia, MD;  Location: [facility];  Service: Endoscopy;;       Family History  Problem Relation Age of Onset  . Stroke Father   . Stroke Mother   . Stroke Sister   . Stroke Brother   . Stroke Brother   . Stroke Brother     Social History   Tobacco Use  . Smoking status: Former Smoker    Packs/day: 1.00    Years: 45.00    Pack years: 45.00    Types: Cigarettes    Quit date: 09/07/1979    Years since quitting: 40.4  . Smokeless tobacco: Never Used  Substance Use Topics  . Alcohol use: Yes    Comment: beer rarely  . Drug use: No    Home Medications Prior to Admission medications   Medication Sig Start Date End Date Taking? Authorizing Provider  allopurinol (ZYLOPRIM) 100 MG tablet TAKE 1 TABLET BY MOUTH EVERY DAY Patient taking differently: Take 100 mg by mouth daily.  01/03/20  Yes Marrian Salvage, FNP  amLODipine (NORVASC) 5 MG tablet TAKE 1 TABLET BY MOUTH EVERY DAY Patient taking differently: Take 2.5 mg by mouth daily.  11/12/19  Yes Marrian Salvage, FNP  apixaban (ELIQUIS) 2.5 MG TABS tablet Take 1 tablet (2.5 mg total) by mouth 2 (two) times daily. 03/18/19  Yes Nahser, Wonda Cheng, MD  cholecalciferol (VITAMIN D) 1000 units tablet Take 1,000 Units by mouth 2 (two) times daily.    Yes [provider]  diphenhydramine-acetaminophen (TYLENOL PM) 25-500 MG TABS tablet Take 1 tablet by mouth at bedtime as needed (pain).   Yes [provider]  epoetin alfa (EPOGEN,PROCRIT) 36629 UNIT/ML injection Inject 10,000 Units into the vein once a week.   Yes [provider]  JANUVIA 50 MG tablet TAKE 1 TABLET BY MOUTH EVERY DAY Patient taking differently: Take 50 mg by mouth daily.  12/16/19  Yes Marrian Salvage, FNP  metoprolol succinate (TOPROL-XL) 50 MG 24 hr tablet Take 1 tablet (50 mg total) by mouth 2 (two) times daily. Take with or immediately following a meal. 03/05/19  Yes Marrian Salvage, FNP  Omega-3 Fatty Acids (FISH OIL) 1000 MG CAPS Take 1,000 mg by mouth at bedtime.    Yes [provider]  ondansetron (ZOFRAN) 8 MG tablet Take by mouth every 8 (eight) hours as needed for nausea or vomiting.   Yes [provider]  pantoprazole (PROTONIX) 40 MG tablet Take 40 mg by mouth daily as needed (indigestion).  01/26/18 01/21/20 Yes [provider]  promethazine (PHENERGAN) 12.5 MG tablet Take 12.5 mg by mouth every 6 (six) hours as  needed for nausea or vomiting.   Yes [provider]  protein supplement shake (PREMIER PROTEIN) LIQD Take 2 oz by mouth daily as needed.    Yes [provider]  sodium chloride (SALINE MIST) 0.65 % nasal spray Place 1 spray into the nose at bedtime.    Yes [provider]  triamcinolone cream (KENALOG) 0.1 % Apply 1 application topically 2 (two) times daily.  11/06/19  Yes [provider]  glucose blood (ONE TOUCH ULTRA TEST) test strip USE ONE STRIP PER TEST. CHECK BLOOD SUGARS 1-4 TIMES PER DAY AS INSTRUCTED. 10/16/19   Marrian Salvage, FNP  mupirocin ointment (BACTROBAN) 2 % Apply 1 application topically 2 (two) times daily. Patient not taking: Reported on 01/21/2020 08/26/19   Marrian Salvage, FNP  OneTouch Delica Lancets 09T MISC TEST BLOOD SUGARS 1-4 TIMES A DAY AS INSTRUCTED. DX CODE: E11.9 05/29/19   Marrian Salvage, FNP    Allergies    Amoxicillin-pot clavulanate, Colchicine, Flexeril [cyclobenzaprine hcl], Levaquin [levofloxacin in d5w], Percocet [oxycodone-acetaminophen], Zebeta, and Bisoprolol fumarate  Review of Systems    Review of Systems  Constitutional: Positive for appetite change and fatigue. Negative for chills, diaphoresis and fever.  HENT: Negative for congestion.   Eyes: Negative for pain.  Respiratory: Negative for cough and shortness of breath.   Cardiovascular: Negative for chest pain and leg swelling.  Gastrointestinal: Negative for abdominal pain and vomiting.  Genitourinary: Negative for dysuria.  Musculoskeletal: Negative for myalgias.  Skin: Negative for rash.  Neurological: Positive for weakness (Generalized). Negative for dizziness and headaches.    Physical Exam Updated Vital Signs BP (!) 165/80 (BP Location: Right Arm)   Pulse 72   Temp 99.3 F (37.4 C) (Oral)   Resp 16   SpO2 100%   Physical Exam Vitals and nursing note reviewed.  Constitutional:      General: He is not in acute distress. HENT:     Head: Normocephalic and atraumatic.     Nose: Nose normal.  Eyes:     General: No scleral icterus. Cardiovascular:     Rate and Rhythm: Normal rate and regular rhythm.     Pulses: Normal pulses.     Heart sounds: Normal heart sounds.  Pulmonary:     Effort: Pulmonary effort is normal. No respiratory distress.     Breath sounds: No wheezing.  Abdominal:     Palpations: Abdomen is soft.     Tenderness: There is no abdominal tenderness.  Musculoskeletal:     Cervical back: Normal range of motion.     Right lower leg: No edema.     Left lower leg: No edema.  Skin:    General: Skin is warm and dry.     Capillary Refill: Capillary refill takes less than 2 seconds.     Coloration: Skin is jaundiced.  Neurological:     Mental Status: He is alert. Mental status is at baseline.  Psychiatric:        Mood and Affect: Mood normal.        Behavior: Behavior normal.     ED Results / Procedures / Treatments   Labs (all labs ordered are listed, but only abnormal results are displayed) Labs Reviewed  CBC WITH DIFFERENTIAL/PLATELET - Abnormal; Notable for the following  components:      Result Value   WBC 12.1 (*)    RBC 3.23 (*)    Hemoglobin 10.4 (*)    HCT 31.2 (*)    Platelets  138 (*)    Neutro Abs 10.7 (*)    Lymphs Abs 0.4 (*)    All other components within normal limits  BASIC METABOLIC PANEL - Abnormal; Notable for the following components:   Sodium 127 (*)    Potassium 5.4 (*)    Chloride 96 (*)    CO2 20 (*)    Glucose, Bld 227 (*)    BUN 43 (*)    Creatinine, Ser 3.30 (*)    Calcium 8.7 (*)    GFR calc non Af Amer 15 (*)    GFR calc Af Amer 18 (*)    All other components within normal limits  HEPATIC FUNCTION PANEL - Abnormal; Notable for the following components:   Albumin 3.1 (*)    AST 104 (*)    ALT 89 (*)    Alkaline Phosphatase 227 (*)    Total Bilirubin 2.4 (*)    Bilirubin, Direct 1.6 (*)    All other components within normal limits  SARS CORONAVIRUS 2 (TAT 6-24 HRS)  LIPASE, BLOOD  TSH    EKG None  Radiology CT ABDOMEN PELVIS WO CONTRAST  Result Date: 01/21/2020 CLINICAL DATA:  Cholangiocarcinoma.  Jaundice. EXAM: CT ABDOMEN AND PELVIS WITHOUT CONTRAST TECHNIQUE: Multidetector CT imaging of the abdomen and pelvis was performed following the standard protocol without IV contrast. COMPARISON:  06/17/2017 FINDINGS: Lower chest: Mild interstitial lung disease at the bases, with suggestion of honeycombing. Normal heart size without pericardial or pleural effusion. Dilated thoracic duct on 19/2. Hepatobiliary: Biliary stents x2 within the lateral and medial segment left liver lobes. The medial segment left liver lobe stent terminates at the level of the mid common duct. The lateral segment stent terminates just above the ampulla. No pneumobilia. Liver is overall poorly evaluated secondary to noncontrast technique. Moderate intrahepatic biliary duct dilatation within the right hepatic lobe including on 18/2, felt to be similar to the 2018 MRI. The lateral segment left liver lobe demonstrates atrophy and diffuse hypoattenuation.  Medial segment left hepatic lobe mild intrahepatic duct dilatation, including on 17/2, similar to minimally improved compared to 06/16/2017 MRI. Soft tissue fullness within the inferior left hepatic lobe, including on 23/2 which is suspicious for primary progression, but suboptimally evaluated. Cholecystectomy. Pancreas: Normal pancreas for age, without duct dilatation or acute inflammation. Spleen: Normal in size, without focal abnormality. Adrenals/Urinary Tract: Normal right adrenal gland. Mild left adrenal thickening. Similar appearance of the right kidney, with a mild chronic ureteropelvic junction obstruction. No hydroureter. No bladder calculi. Stomach/Bowel: Proximal gastric underdistention. Colonic stool burden suggests constipation. Normal terminal ileum. Normal small bowel. Vascular/Lymphatic: Advanced aortic and branch vessel atherosclerosis. Portal caval node measures 1.1 cm on 27/2 versus 7 mm on the prior MRI (when remeasured). No pelvic sidewall adenopathy. Reproductive: Hysterectomy.  No adnexal mass. Other: No significant free fluid. Mild pelvic floor laxity. No evidence of omental or peritoneal disease. Musculoskeletal: Osteopenia. Convex right lumbar spine curvature. Lumbosacral spondylosis. IMPRESSION: 1. Limited evaluation of the liver secondary to noncontrast CT technique. 2. Left hepatic lobe biliary stents in place. Intrahepatic biliary duct dilatation involving the right and medial segment left lobes, relatively similar to the MRI of 06/16/2017. Given this finding and absence of pneumobilia, stent dysfunction (especially the stent originating in the medial segment left lobe) is suspected. 3. Soft tissue fullness in the central left hepatic lobe, suboptimally evaluated but suspicious for disease progression when compared to the prior MRI. 4. Developing mild portal caval adenopathy, also suspicious for metastatic disease. 5.  Possible constipation. 6. Similar appearance of the right kidney,  favored to represent a chronic ureteropelvic junction obstruction pattern. Electronically Signed   By: Abigail Miyamoto M.D.   On: 01/21/2020 18:25    Procedures Procedures (including critical care time)  Medications Ordered in ED Medications  sodium chloride 0.9 % bolus 1,000 mL (0 mLs Intravenous Stopped 01/21/20 1729)  ciprofloxacin (CIPRO) IVPB 400 mg (400 mg Intravenous New Bag/Given 01/21/20 1943)    And  metroNIDAZOLE (FLAGYL) IVPB 500 mg (500 mg Intravenous New Bag/Given 01/21/20 1943)    ED Course  I have reviewed the triage vital signs and the nursing notes.  Pertinent labs & imaging results that were available during my care of the patient were reviewed by me and considered in my medical decision making (see chart for details).   Clinical Course as of Jan 21 2208  Tue Jan 21, 2020  1855 New leukocytosis from last bloodwork 7 days ago.    [WF]  1856 Mild transaminitis; total bili has elevated from 0.6 to 2.4 over the past week.    [WF]  1858 Sodium is somewhat decreased at 127 however it is 129 vs 131 when corrected for blood sugar.  Patient is mentating well.   [WF]  1859 Creatinine and BUN at baseline.   [WF]  1859 CT read independently reviewed by myself. Formal interpretation below IMPRESSION: 1. Limited evaluation of the liver secondary to noncontrast CT technique. 2. Left hepatic lobe biliary stents in place. Intrahepatic biliary duct dilatation involving the right and medial segment left lobes, relatively similar to the MRI of 06/16/2017. Given this finding and absence of pneumobilia, stent dysfunction (especially the stent originating in the medial segment left lobe) is suspected. 3. Soft tissue fullness in the central left hepatic lobe, suboptimally evaluated but suspicious for disease progression when compared to the prior MRI. 4. Developing mild portal caval adenopathy, also suspicious for metastatic disease. 5. Possible constipation. 6. Similar appearance  of the right kidney, favored to represent a chronic ureteropelvic junction obstruction pattern.     [WF]  1900 GI consulted due to possible hepatic stent dysfunction.   [WF]  1926 Gastroenterology.  Dr. Hilarie Fredrickson recommended admission for IV antibiotics to prevent cholangitis from occurring.  Will consider ERCP in the morning.  As daughter is concerned for ERCP being done at hospital other than [facility] I communicated this with Dr. Tonye Royalty  Recommended ciprofloxacin and Flagyl and n.p.o. after midnight.  Hold Eliquis.   [WF]  2106 Discussed case with Legrand Como A. Leamon Arnt who is the medical oncologist on-call. Patient will be transferred.    [WF]    Clinical Course User Index [WF] Tedd Sias, [place]    Dr. Hilarie Fredrickson recommended hold elequis and  NPO after midnight.   Patient will be transferred to [facility] for further care management.  Daughter and patient made aware of this.  They are agreeable to plan.  MDM Rules/Calculators/A&P                       Final Clinical Impression(s) / ED Diagnoses Final diagnoses:  Cholangiocarcinoma [facility])  Hyperbilirubinemia    Rx / DC Orders ED Discharge Orders    None       Tedd Sias, [place] 01/21/20 [place], DO 01/22/20 [phone]

## 2020-01-21 NOTE — ED Triage Notes (Signed)
Per pt, states he just doesn't feel well, denies pain-sent over via GI for abnormal labs-jaundice

## 2020-01-22 DIAGNOSIS — E1122 Type 2 diabetes mellitus with diabetic chronic kidney disease: Secondary | ICD-10-CM | POA: Diagnosis not present

## 2020-01-22 DIAGNOSIS — N184 Chronic kidney disease, stage 4 (severe): Secondary | ICD-10-CM | POA: Diagnosis not present

## 2020-01-22 DIAGNOSIS — Z79899 Other long term (current) drug therapy: Secondary | ICD-10-CM | POA: Diagnosis not present

## 2020-01-22 DIAGNOSIS — C221 Intrahepatic bile duct carcinoma: Secondary | ICD-10-CM | POA: Diagnosis not present

## 2020-01-22 DIAGNOSIS — I129 Hypertensive chronic kidney disease with stage 1 through stage 4 chronic kidney disease, or unspecified chronic kidney disease: Secondary | ICD-10-CM | POA: Diagnosis not present

## 2020-01-22 DIAGNOSIS — Z7984 Long term (current) use of oral hypoglycemic drugs: Secondary | ICD-10-CM | POA: Diagnosis not present

## 2020-01-22 DIAGNOSIS — Z7901 Long term (current) use of anticoagulants: Secondary | ICD-10-CM | POA: Diagnosis not present

## 2020-01-22 DIAGNOSIS — Z87891 Personal history of nicotine dependence: Secondary | ICD-10-CM | POA: Diagnosis not present

## 2020-01-22 DIAGNOSIS — R945 Abnormal results of liver function studies: Secondary | ICD-10-CM | POA: Diagnosis present

## 2020-01-22 DIAGNOSIS — I2510 Atherosclerotic heart disease of native coronary artery without angina pectoris: Secondary | ICD-10-CM | POA: Diagnosis not present

## 2020-01-22 DIAGNOSIS — Z20822 Contact with and (suspected) exposure to covid-19: Secondary | ICD-10-CM | POA: Diagnosis not present

## 2020-01-23 ENCOUNTER — Encounter: Payer: Self-pay | Admitting: Family

## 2020-01-24 ENCOUNTER — Encounter: Payer: Self-pay | Admitting: Family

## 2020-01-24 MED ORDER — GENERIC EXTERNAL MEDICATION
75.00 | Status: DC
Start: 2020-01-24 — End: 2020-01-24

## 2020-01-24 MED ORDER — DEXTROSE 50 % IV SOLN
12.50 | INTRAVENOUS | Status: DC
Start: ? — End: 2020-01-24

## 2020-01-24 MED ORDER — INSULIN LISPRO 100 UNIT/ML ~~LOC~~ SOLN
0.00 | SUBCUTANEOUS | Status: DC
Start: 2020-01-25 — End: 2020-01-24

## 2020-01-24 MED ORDER — AMLODIPINE BESYLATE 5 MG PO TABS
2.50 | ORAL_TABLET | ORAL | Status: DC
Start: 2020-01-26 — End: 2020-01-24

## 2020-01-24 MED ORDER — HEPARIN SODIUM (PORCINE) 5000 UNIT/ML IJ SOLN
5000.00 | INTRAMUSCULAR | Status: DC
Start: 2020-01-24 — End: 2020-01-24

## 2020-01-24 MED ORDER — CARBOXYMETHYLCELLULOSE SODIUM 0.5 % OP SOLN
1.00 | OPHTHALMIC | Status: DC
Start: ? — End: 2020-01-24

## 2020-01-24 MED ORDER — ALLOPURINOL 100 MG PO TABS
100.00 | ORAL_TABLET | ORAL | Status: DC
Start: 2020-01-26 — End: 2020-01-24

## 2020-01-24 MED ORDER — CIPROFLOXACIN HCL 500 MG PO TABS
500.00 | ORAL_TABLET | ORAL | Status: DC
Start: 2020-01-26 — End: 2020-01-24

## 2020-01-24 MED ORDER — PANTOPRAZOLE SODIUM 40 MG PO TBEC
40.00 | DELAYED_RELEASE_TABLET | ORAL | Status: DC
Start: 2020-01-26 — End: 2020-01-24

## 2020-01-24 MED ORDER — LIDOCAINE HCL 1 % IJ SOLN
0.50 | INTRAMUSCULAR | Status: DC
Start: ? — End: 2020-01-24

## 2020-01-24 MED ORDER — METRONIDAZOLE 250 MG PO TABS
500.00 | ORAL_TABLET | ORAL | Status: DC
Start: 2020-01-25 — End: 2020-01-24

## 2020-01-24 MED ORDER — GLUCAGON (RDNA) 1 MG IJ KIT
1.00 | PACK | INTRAMUSCULAR | Status: DC
Start: ? — End: 2020-01-24

## 2020-01-24 MED ORDER — PROMETHAZINE HCL 12.5 MG PO TABS
12.50 | ORAL_TABLET | ORAL | Status: DC
Start: ? — End: 2020-01-24

## 2020-01-24 MED ORDER — GENERIC EXTERNAL MEDICATION
Status: DC
Start: ? — End: 2020-01-24

## 2020-01-27 MED ORDER — GENERIC EXTERNAL MEDICATION
Status: DC
Start: ? — End: 2020-01-27

## 2020-01-27 MED ORDER — GENERIC EXTERNAL MEDICATION
37.50 | Status: DC
Start: 2020-01-25 — End: 2020-01-27

## 2020-01-27 MED ORDER — APIXABAN 2.5 MG PO TABS
2.50 | ORAL_TABLET | ORAL | Status: DC
Start: 2020-01-25 — End: 2020-01-27

## 2020-01-28 ENCOUNTER — Encounter (HOSPITAL_COMMUNITY): Payer: Medicare Other

## 2020-01-29 ENCOUNTER — Ambulatory Visit (HOSPITAL_COMMUNITY)
Admission: RE | Admit: 2020-01-29 | Discharge: 2020-01-29 | Disposition: A | Discharge status: Home / Self Care | Payer: Medicare Other | Source: Ambulatory Visit | Attending: Nephrology | Admitting: Nephrology

## 2020-01-29 ENCOUNTER — Other Ambulatory Visit: Payer: Self-pay

## 2020-01-29 VITALS — BP 159/65 | HR 65 | Temp 98.3°F | Resp 20

## 2020-01-29 DIAGNOSIS — D649 Anemia, unspecified: Secondary | ICD-10-CM

## 2020-01-29 DIAGNOSIS — N184 Chronic kidney disease, stage 4 (severe): Secondary | ICD-10-CM | POA: Insufficient documentation

## 2020-01-29 DIAGNOSIS — D631 Anemia in chronic kidney disease: Secondary | ICD-10-CM | POA: Insufficient documentation

## 2020-01-29 LAB — POCT HEMOGLOBIN-HEMACUE: Hemoglobin: 8.9 g/dL — ABNORMAL LOW (ref 13.0–17.0)

## 2020-01-29 LAB — IRON AND TIBC
Iron: 80 ug/dL (ref 45–182)
Saturation Ratios: 37 % (ref 17.9–39.5)
TIBC: 218 ug/dL — ABNORMAL LOW (ref 250–450)
UIBC: 138 ug/dL

## 2020-01-29 LAB — FERRITIN: Ferritin: 285 ng/mL (ref 24–336)

## 2020-01-29 MED ORDER — EPOETIN ALFA 10000 UNIT/ML IJ SOLN
10000.0000 [IU] | INTRAMUSCULAR | Status: DC
  Administered 2020-01-29: 10000 [IU] via SUBCUTANEOUS

## 2020-01-29 MED ORDER — EPOETIN ALFA 10000 UNIT/ML IJ SOLN
INTRAMUSCULAR | Status: AC
  Filled 2020-01-29: qty 1

## 2020-01-30 ENCOUNTER — Encounter: Payer: Self-pay | Admitting: Family

## 2020-01-31 ENCOUNTER — Inpatient Hospital Stay: Payer: Medicare Other | Admitting: Family

## 2020-01-31 ENCOUNTER — Encounter: Payer: Self-pay | Admitting: Family

## 2020-01-31 ENCOUNTER — Other Ambulatory Visit: Payer: Self-pay | Admitting: Family

## 2020-01-31 DIAGNOSIS — C221 Intrahepatic bile duct carcinoma: Secondary | ICD-10-CM

## 2020-02-03 ENCOUNTER — Encounter: Payer: Self-pay | Admitting: Family

## 2020-02-03 ENCOUNTER — Other Ambulatory Visit (INDEPENDENT_AMBULATORY_CARE_PROVIDER_SITE_OTHER): Payer: Medicare Other

## 2020-02-03 DIAGNOSIS — C221 Intrahepatic bile duct carcinoma: Secondary | ICD-10-CM

## 2020-02-03 LAB — CBC WITH DIFFERENTIAL/PLATELET
Absolute Monocytes: 661 cells/uL (ref 200–950)
Basophils Absolute: 30 cells/uL (ref 0–200)
Basophils Relative: 0.4 %
Eosinophils Absolute: 220 cells/uL (ref 15–500)
Eosinophils Relative: 2.9 %
HCT: 28.2 % — ABNORMAL LOW (ref 38.5–50.0)
Hemoglobin: 8.9 g/dL — ABNORMAL LOW (ref 13.2–17.1)
Lymphs Abs: 380 cells/uL — ABNORMAL LOW (ref 850–3900)
MCH: 30.6 pg (ref 27.0–33.0)
MCHC: 31.6 g/dL — ABNORMAL LOW (ref 32.0–36.0)
MCV: 96.9 fL (ref 80.0–100.0)
MPV: 13.3 fL — ABNORMAL HIGH (ref 7.5–12.5)
Monocytes Relative: 8.7 %
Neutro Abs: 6308 cells/uL (ref 1500–7800)
Neutrophils Relative %: 83 %
Platelets: 234 10*3/uL (ref 140–400)
RBC: 2.91 10*6/uL — ABNORMAL LOW (ref 4.20–5.80)
RDW: 14 % (ref 11.0–15.0)
Total Lymphocyte: 5 %
WBC: 7.6 10*3/uL (ref 3.8–10.8)

## 2020-02-03 LAB — COMPREHENSIVE METABOLIC PANEL
ALT: 21 U/L (ref 0–53)
AST: 27 U/L (ref 0–37)
Albumin: 3.3 g/dL — ABNORMAL LOW (ref 3.5–5.2)
Alkaline Phosphatase: 196 U/L — ABNORMAL HIGH (ref 39–117)
BUN: 46 mg/dL — ABNORMAL HIGH (ref 6–23)
CO2: 21 mEq/L (ref 19–32)
Calcium: 8.5 mg/dL (ref 8.4–10.5)
Chloride: 97 mEq/L (ref 96–112)
Creatinine, Ser: 3.19 mg/dL — ABNORMAL HIGH (ref 0.40–1.50)
GFR: 18.31 mL/min — ABNORMAL LOW (ref >60.00)
Glucose, Bld: 174 mg/dL — ABNORMAL HIGH (ref 70–99)
Potassium: 4.8 mEq/L (ref 3.5–5.1)
Sodium: 126 mEq/L — ABNORMAL LOW (ref 135–145)
Total Bilirubin: 0.8 mg/dL (ref 0.2–1.2)
Total Protein: 7.1 g/dL (ref 6.0–8.3)

## 2020-02-05 ENCOUNTER — Ambulatory Visit (HOSPITAL_COMMUNITY)
Admission: RE | Admit: 2020-02-05 | Discharge: 2020-02-05 | Disposition: A | Discharge status: Home / Self Care | Payer: Medicare Other | Source: Ambulatory Visit | Attending: Nephrology | Admitting: Nephrology

## 2020-02-05 ENCOUNTER — Other Ambulatory Visit: Payer: Self-pay

## 2020-02-05 VITALS — BP 154/58 | HR 57 | Temp 98.3°F | Resp 20

## 2020-02-05 DIAGNOSIS — N184 Chronic kidney disease, stage 4 (severe): Secondary | ICD-10-CM | POA: Insufficient documentation

## 2020-02-05 DIAGNOSIS — D631 Anemia in chronic kidney disease: Secondary | ICD-10-CM | POA: Diagnosis not present

## 2020-02-05 DIAGNOSIS — D649 Anemia, unspecified: Secondary | ICD-10-CM

## 2020-02-05 LAB — POCT HEMOGLOBIN-HEMACUE: Hemoglobin: 8.6 g/dL — ABNORMAL LOW (ref 13.0–17.0)

## 2020-02-05 MED ORDER — EPOETIN ALFA 10000 UNIT/ML IJ SOLN
INTRAMUSCULAR | Status: AC
  Filled 2020-02-05: qty 1

## 2020-02-05 MED ORDER — EPOETIN ALFA 10000 UNIT/ML IJ SOLN
10000.0000 [IU] | INTRAMUSCULAR | Status: DC
  Administered 2020-02-05: 10000 [IU] via SUBCUTANEOUS

## 2020-02-09 ENCOUNTER — Other Ambulatory Visit: Payer: Self-pay | Admitting: Family

## 2020-02-11 ENCOUNTER — Encounter: Payer: Self-pay | Admitting: Family

## 2020-02-12 ENCOUNTER — Other Ambulatory Visit: Payer: Self-pay

## 2020-02-12 ENCOUNTER — Other Ambulatory Visit: Payer: Self-pay | Admitting: Family

## 2020-02-12 ENCOUNTER — Ambulatory Visit (HOSPITAL_COMMUNITY)
Admission: RE | Admit: 2020-02-12 | Discharge: 2020-02-12 | Disposition: A | Discharge status: Home / Self Care | Payer: Medicare Other | Source: Ambulatory Visit | Attending: Nephrology | Admitting: Nephrology

## 2020-02-12 VITALS — BP 152/56 | HR 59 | Temp 94.2°F | Resp 20

## 2020-02-12 DIAGNOSIS — D631 Anemia in chronic kidney disease: Secondary | ICD-10-CM | POA: Diagnosis not present

## 2020-02-12 DIAGNOSIS — N184 Chronic kidney disease, stage 4 (severe): Secondary | ICD-10-CM | POA: Insufficient documentation

## 2020-02-12 DIAGNOSIS — C221 Intrahepatic bile duct carcinoma: Secondary | ICD-10-CM

## 2020-02-12 DIAGNOSIS — D649 Anemia, unspecified: Secondary | ICD-10-CM

## 2020-02-12 LAB — POCT HEMOGLOBIN-HEMACUE: Hemoglobin: 9.2 g/dL — ABNORMAL LOW (ref 13.0–17.0)

## 2020-02-12 MED ORDER — EPOETIN ALFA 10000 UNIT/ML IJ SOLN
INTRAMUSCULAR | Status: AC
  Administered 2020-02-12: 10000 [IU] via SUBCUTANEOUS
  Filled 2020-02-12: qty 1

## 2020-02-12 MED ORDER — EPOETIN ALFA 10000 UNIT/ML IJ SOLN: 10000.0000 [IU] | INTRAMUSCULAR | Status: DC

## 2020-02-14 ENCOUNTER — Telehealth: Payer: Self-pay | Admitting: Family

## 2020-02-14 ENCOUNTER — Other Ambulatory Visit (INDEPENDENT_AMBULATORY_CARE_PROVIDER_SITE_OTHER): Payer: Medicare Other

## 2020-02-14 DIAGNOSIS — C221 Intrahepatic bile duct carcinoma: Secondary | ICD-10-CM

## 2020-02-14 DIAGNOSIS — E871 Hypo-osmolality and hyponatremia: Secondary | ICD-10-CM

## 2020-02-14 DIAGNOSIS — N184 Chronic kidney disease, stage 4 (severe): Secondary | ICD-10-CM

## 2020-02-14 LAB — COMPREHENSIVE METABOLIC PANEL
ALT: 32 U/L (ref 0–53)
AST: 36 U/L (ref 0–37)
Albumin: 3.4 g/dL — ABNORMAL LOW (ref 3.5–5.2)
Alkaline Phosphatase: 198 U/L — ABNORMAL HIGH (ref 39–117)
BUN: 43 mg/dL — ABNORMAL HIGH (ref 6–23)
CO2: 21 mEq/L (ref 19–32)
Calcium: 8.4 mg/dL (ref 8.4–10.5)
Chloride: 93 mEq/L — ABNORMAL LOW (ref 96–112)
Creatinine, Ser: 2.97 mg/dL — ABNORMAL HIGH (ref 0.40–1.50)
GFR: 19.89 mL/min — ABNORMAL LOW (ref >60.00)
Glucose, Bld: 157 mg/dL — ABNORMAL HIGH (ref 70–99)
Potassium: 4.9 mEq/L (ref 3.5–5.1)
Sodium: 122 mEq/L — ABNORMAL LOW (ref 135–145)
Total Bilirubin: 0.7 mg/dL (ref 0.2–1.2)
Total Protein: 7.1 g/dL (ref 6.0–8.3)

## 2020-02-14 LAB — CBC WITH DIFFERENTIAL/PLATELET
Basophils Absolute: 0 10*3/uL (ref 0.0–0.1)
Basophils Relative: 0.8 % (ref 0.0–3.0)
Eosinophils Absolute: 0.2 10*3/uL (ref 0.0–0.7)
Eosinophils Relative: 2.8 % (ref 0.0–5.0)
HCT: 28.7 % — ABNORMAL LOW (ref 39.0–52.0)
Hemoglobin: 9.5 g/dL — ABNORMAL LOW (ref 13.0–17.0)
Lymphocytes Relative: 6.6 % — ABNORMAL LOW (ref 12.0–46.0)
Lymphs Abs: 0.4 10*3/uL — ABNORMAL LOW (ref 0.7–4.0)
MCHC: 33.2 g/dL (ref 30.0–36.0)
MCV: 95.2 fl (ref 78.0–100.0)
Monocytes Absolute: 0.8 10*3/uL (ref 0.1–1.0)
Monocytes Relative: 14 % — ABNORMAL HIGH (ref 3.0–12.0)
Neutro Abs: 4.3 10*3/uL (ref 1.4–7.7)
Neutrophils Relative %: 75.8 % (ref 43.0–77.0)
Platelets: 136 10*3/uL — ABNORMAL LOW (ref 150.0–400.0)
RBC: 3.02 Mil/uL — ABNORMAL LOW (ref 4.22–5.81)
RDW: 15.7 % — ABNORMAL HIGH (ref 11.5–15.5)
WBC: 5.7 10*3/uL (ref 4.0–10.5)

## 2020-02-14 MED ORDER — FUROSEMIDE 20 MG PO TABS: ORAL_TABLET | ORAL | 0 refills | Status: DC

## 2020-02-14 NOTE — Telephone Encounter (Signed)
-----   Message from Elmarie Shiley, MD sent at 02/14/2020  1:23 PM EDT ----- Regarding: RE: I recommend that he decreases oral fluid intake to <32oz/day (Contrary to the theory that he is going to get dehydrated). Is he still on lasix? I recommend 20mg  daily for the next 2 days with BMET again on Monday along with serum and urine osmolality.  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 ----- Message ----- From: Marrian Salvage, FNP Sent: 02/14/2020   1:14 PM EDT To: Elmarie Shiley, MD  Dr. Posey Pronto,  I was reaching out to you about Gerald Jacobs. I ordered labs today for him per the oncologist at [facility] request. His sodium level today is at 122. Per [name], Gerald Jacobs is doing well but I was concerned about this level going into the weekend.   We will fax these labs to your office for review. Thank you for time.  Gerald Jacobs, [place]

## 2020-02-14 NOTE — Telephone Encounter (Signed)
I spoke with patient's daughter about recommendations and she will get information to her father.  She understands he need for daily fluid restriction less than 32 ounces per day and daily Lasix 20 mg; She will make sure her father returns to lab at [facility] on Monday for re-check.

## 2020-02-14 NOTE — Progress Notes (Signed)
Results were reviewed with patient's nephrologist who gave directions for managing low sodium over the weekend without having to send patient to hospital; discussed with patient's daughter and she understands treatment plan; will re-check labs on Monday.

## 2020-02-14 NOTE — Telephone Encounter (Signed)
-----   Message from Elmarie Shiley, MD sent at 02/14/2020  1:20 PM EDT ----- Regarding: RE: Difficult situation, I suspect that his initial low sodium was because of his worsening renal insufficiency and volume excess however, I fear that in order to avoid dehydration/volume depletion, he may be drinking too much fluid. My recommendations would be as follows: have him restrict daily fluid intake to <32 ounces a day. Is he still on furosemide? I remember his oncologist wanted to start him on 40 mg daily for at least two days. ----- Message ----- From: Marrian Salvage, FNP Sent: 02/14/2020   1:14 PM EDT To: Elmarie Shiley, MD  Dr. Posey Pronto,  I was reaching out to you about Gerald Jacobs. I ordered labs today for him per the oncologist at [facility] request. His sodium level today is at 122. Per [name], Gerald Jacobs is doing well but I was concerned about this level going into the weekend.   We will fax these labs to your office for review. Thank you for time.  Jodi Mourning, [place]

## 2020-02-17 ENCOUNTER — Encounter: Payer: Self-pay | Admitting: Family

## 2020-02-17 ENCOUNTER — Telehealth: Payer: Self-pay | Admitting: Family

## 2020-02-17 ENCOUNTER — Other Ambulatory Visit (INDEPENDENT_AMBULATORY_CARE_PROVIDER_SITE_OTHER): Payer: Medicare Other

## 2020-02-17 DIAGNOSIS — N184 Chronic kidney disease, stage 4 (severe): Secondary | ICD-10-CM

## 2020-02-17 DIAGNOSIS — C221 Intrahepatic bile duct carcinoma: Secondary | ICD-10-CM

## 2020-02-17 DIAGNOSIS — E871 Hypo-osmolality and hyponatremia: Secondary | ICD-10-CM

## 2020-02-17 LAB — COMPREHENSIVE METABOLIC PANEL
ALT: 30 U/L (ref 0–53)
AST: 36 U/L (ref 0–37)
Albumin: 3.3 g/dL — ABNORMAL LOW (ref 3.5–5.2)
Alkaline Phosphatase: 180 U/L — ABNORMAL HIGH (ref 39–117)
BUN: 42 mg/dL — ABNORMAL HIGH (ref 6–23)
CO2: 22 mEq/L (ref 19–32)
Calcium: 8.5 mg/dL (ref 8.4–10.5)
Chloride: 95 mEq/L — ABNORMAL LOW (ref 96–112)
Creatinine, Ser: 3.06 mg/dL — ABNORMAL HIGH (ref 0.40–1.50)
GFR: 19.21 mL/min — ABNORMAL LOW (ref >60.00)
Glucose, Bld: 161 mg/dL — ABNORMAL HIGH (ref 70–99)
Potassium: 4.9 mEq/L (ref 3.5–5.1)
Sodium: 125 mEq/L — ABNORMAL LOW (ref 135–145)
Total Bilirubin: 0.6 mg/dL (ref 0.2–1.2)
Total Protein: 6.9 g/dL (ref 6.0–8.3)

## 2020-02-17 NOTE — Telephone Encounter (Signed)
-----   Message from Elmarie Shiley, MD sent at 02/17/2020  2:41 PM EDT ----- Regarding: RE: Yes, continue at 20mg /day for the next 5 days along with fluid restriction   Labs Friday or Monday ----- Message ----- From: Marrian Salvage, FNP Sent: 02/17/2020   2:32 PM EDT To: Elmarie Shiley, MD  Dr. Posey Pronto,  I was checking with you today about [name] as we discussed. Sodium is better at 125. I had him take 20 mg of Lasix daily over the weekend. I asked for the osmolality serum and urine to be done STAT but have not gotten those results. Okay to continue daily Lasix 20 mg?  Thank you for your guidance- Jodi Mourning, FNP

## 2020-02-17 NOTE — Telephone Encounter (Signed)
Reviewed with patient's daughter; they will continue Lasix 20 mg daily and plan to check labs again on Friday of this week;

## 2020-02-18 ENCOUNTER — Other Ambulatory Visit: Payer: Self-pay

## 2020-02-18 ENCOUNTER — Encounter (HOSPITAL_COMMUNITY)
Admission: RE | Admit: 2020-02-18 | Discharge: 2020-02-18 | Disposition: A | Discharge status: Home / Self Care | Payer: Medicare Other | Source: Ambulatory Visit | Attending: Nephrology | Admitting: Nephrology

## 2020-02-18 VITALS — BP 152/58 | HR 63 | Temp 95.1°F | Resp 20

## 2020-02-18 DIAGNOSIS — D631 Anemia in chronic kidney disease: Secondary | ICD-10-CM | POA: Insufficient documentation

## 2020-02-18 DIAGNOSIS — D649 Anemia, unspecified: Secondary | ICD-10-CM | POA: Insufficient documentation

## 2020-02-18 DIAGNOSIS — N184 Chronic kidney disease, stage 4 (severe): Secondary | ICD-10-CM | POA: Insufficient documentation

## 2020-02-18 LAB — POCT HEMOGLOBIN-HEMACUE: Hemoglobin: 9.4 g/dL — ABNORMAL LOW (ref 13.0–17.0)

## 2020-02-18 MED ORDER — EPOETIN ALFA 10000 UNIT/ML IJ SOLN: 10000.0000 [IU] | INTRAMUSCULAR | Status: DC

## 2020-02-18 MED ORDER — EPOETIN ALFA 10000 UNIT/ML IJ SOLN
INTRAMUSCULAR | Status: AC
  Administered 2020-02-18: 10000 [IU]
  Filled 2020-02-18: qty 1

## 2020-02-19 ENCOUNTER — Encounter (HOSPITAL_COMMUNITY): Payer: Medicare Other

## 2020-02-20 LAB — OSMOLALITY: Osmolality: 286 mOsm/kg (ref 278–305)

## 2020-02-20 LAB — EXTRA URINE SPECIMEN

## 2020-02-20 LAB — OSMOLALITY, URINE: Osmolality, Ur: 241 mOsm/kg (ref 50–1200)

## 2020-02-21 ENCOUNTER — Other Ambulatory Visit: Payer: Self-pay | Admitting: Family

## 2020-02-21 ENCOUNTER — Encounter: Payer: Self-pay | Admitting: Family

## 2020-02-21 ENCOUNTER — Telehealth: Payer: Self-pay | Admitting: Family

## 2020-02-21 ENCOUNTER — Other Ambulatory Visit (INDEPENDENT_AMBULATORY_CARE_PROVIDER_SITE_OTHER): Payer: Medicare Other

## 2020-02-21 ENCOUNTER — Other Ambulatory Visit: Payer: Self-pay

## 2020-02-21 DIAGNOSIS — C221 Intrahepatic bile duct carcinoma: Secondary | ICD-10-CM

## 2020-02-21 DIAGNOSIS — N184 Chronic kidney disease, stage 4 (severe): Secondary | ICD-10-CM

## 2020-02-21 LAB — COMPREHENSIVE METABOLIC PANEL
ALT: 32 U/L (ref 0–53)
AST: 40 U/L — ABNORMAL HIGH (ref 0–37)
Albumin: 3.5 g/dL (ref 3.5–5.2)
Alkaline Phosphatase: 187 U/L — ABNORMAL HIGH (ref 39–117)
BUN: 49 mg/dL — ABNORMAL HIGH (ref 6–23)
CO2: 24 mEq/L (ref 19–32)
Calcium: 8.8 mg/dL (ref 8.4–10.5)
Chloride: 100 mEq/L (ref 96–112)
Creatinine, Ser: 3.46 mg/dL — ABNORMAL HIGH (ref 0.40–1.50)
GFR: 16.67 mL/min — ABNORMAL LOW (ref >60.00)
Glucose, Bld: 131 mg/dL — ABNORMAL HIGH (ref 70–99)
Potassium: 5 mEq/L (ref 3.5–5.1)
Sodium: 130 mEq/L — ABNORMAL LOW (ref 135–145)
Total Bilirubin: 0.5 mg/dL (ref 0.2–1.2)
Total Protein: 6.9 g/dL (ref 6.0–8.3)

## 2020-02-21 LAB — CBC WITH DIFFERENTIAL/PLATELET
Basophils Absolute: 0 10*3/uL (ref 0.0–0.1)
Basophils Relative: 0.8 % (ref 0.0–3.0)
Eosinophils Absolute: 0.3 10*3/uL (ref 0.0–0.7)
Eosinophils Relative: 4.5 % (ref 0.0–5.0)
HCT: 28 % — ABNORMAL LOW (ref 39.0–52.0)
Hemoglobin: 9.3 g/dL — ABNORMAL LOW (ref 13.0–17.0)
Lymphocytes Relative: 7.1 % — ABNORMAL LOW (ref 12.0–46.0)
Lymphs Abs: 0.4 10*3/uL — ABNORMAL LOW (ref 0.7–4.0)
MCHC: 33 g/dL (ref 30.0–36.0)
MCV: 96.2 fl (ref 78.0–100.0)
Monocytes Absolute: 0.9 10*3/uL (ref 0.1–1.0)
Monocytes Relative: 16 % — ABNORMAL HIGH (ref 3.0–12.0)
Neutro Abs: 4 10*3/uL (ref 1.4–7.7)
Neutrophils Relative %: 71.6 % (ref 43.0–77.0)
Platelets: 115 10*3/uL — ABNORMAL LOW (ref 150.0–400.0)
RBC: 2.91 Mil/uL — ABNORMAL LOW (ref 4.22–5.81)
RDW: 16.2 % — ABNORMAL HIGH (ref 11.5–15.5)
WBC: 5.6 10*3/uL (ref 4.0–10.5)

## 2020-02-21 NOTE — Telephone Encounter (Signed)
-----   Message from Elmarie Shiley, MD sent at 02/21/2020  3:09 PM EDT ----- Regarding: RE: I don't think we need to see him early- won't be doing anything different than what you have already done for him.  Have him set up with you for labs in a week (renal function panel, mag and UA).   Message me any time ----- Message ----- From: Marrian Salvage, FNP Sent: 02/21/2020   3:04 PM EDT To: Elmarie Shiley, MD  Dr. Posey Pronto,  I was checking in with your about Mr. Kerce again. His sodium today is much better at 130. He and his family stopped the Lasix 2 days ago because he was feeling dehydrated/ having pain around the kidney area. Creatinine at 3.46 today;  I finally got his serum osmolality back at 286 and urine osmolality at 241.  Should I have him see you in follow-up?  Thanks, Jodi Mourning, FNP

## 2020-02-25 ENCOUNTER — Encounter (HOSPITAL_COMMUNITY)
Admission: RE | Admit: 2020-02-25 | Discharge: 2020-02-25 | Disposition: A | Discharge status: Home / Self Care | Payer: Medicare Other | Source: Ambulatory Visit | Attending: Nephrology | Admitting: Nephrology

## 2020-02-25 ENCOUNTER — Other Ambulatory Visit: Payer: Self-pay

## 2020-02-25 VITALS — BP 150/57 | HR 62 | Temp 96.5°F | Resp 20

## 2020-02-25 DIAGNOSIS — D649 Anemia, unspecified: Secondary | ICD-10-CM | POA: Diagnosis not present

## 2020-02-25 LAB — POCT HEMOGLOBIN-HEMACUE: Hemoglobin: 10 g/dL — ABNORMAL LOW (ref 13.0–17.0)

## 2020-02-25 MED ORDER — EPOETIN ALFA 10000 UNIT/ML IJ SOLN
10000.0000 [IU] | INTRAMUSCULAR | Status: DC
  Administered 2020-02-25: 10000 [IU] via SUBCUTANEOUS

## 2020-02-25 MED ORDER — EPOETIN ALFA 10000 UNIT/ML IJ SOLN
INTRAMUSCULAR | Status: AC
  Filled 2020-02-25: qty 1

## 2020-03-02 ENCOUNTER — Encounter: Payer: Self-pay | Admitting: Family

## 2020-03-02 NOTE — Telephone Encounter (Signed)
Daughter is calling to follow up message. She has been informed the message was sent to [facility].

## 2020-03-02 NOTE — Telephone Encounter (Signed)
Ms. Gerald Jacobs is request [name] to call her back directly at her convenience. I informed that she may have [name] call back. I was then told "[name] has called me directly before".   Called dtr to reassure that there are providers on stand by when a provider is out of the office and the request was sent in today. Dtr stated that her 86 yo father is in pain and is suffering and that he should not have to wait for relief. I explained the refill policy and Ms. [name] interrupted and stated that it was not a refill. I stated "I understand that it is not a refill but it is for a medication and that can take time", I also stated that it is more appropriate for the request to go to PCP since she is more familiar but reassured that if [name] was not going to return within a few days that the request would go to another provider and that provider may request an appointment.

## 2020-03-03 ENCOUNTER — Encounter (HOSPITAL_COMMUNITY): Payer: Medicare Other

## 2020-03-03 ENCOUNTER — Other Ambulatory Visit: Payer: Self-pay | Admitting: Cardiovascular Disease

## 2020-03-03 ENCOUNTER — Other Ambulatory Visit: Payer: Self-pay | Admitting: Family

## 2020-03-03 MED ORDER — TIZANIDINE HCL 2 MG PO CAPS: 2.0000 mg | ORAL_CAPSULE | Freq: Three times a day (TID) | ORAL | 0 refills | Status: DC | PRN

## 2020-03-03 NOTE — Telephone Encounter (Signed)
Pt last saw Dr Acie Fredrickson 10/04/19, last labs 02/21/20 Creat 3.46, age 86, weight 64.8kg, based on specified criteria pt is on appropriate dosage of Eliquis 2.5mg  BID.  Will refill rx.

## 2020-03-04 ENCOUNTER — Encounter: Payer: Self-pay | Admitting: Family

## 2020-03-05 ENCOUNTER — Other Ambulatory Visit: Payer: Medicare Other

## 2020-03-05 ENCOUNTER — Encounter: Payer: Self-pay | Admitting: Internal Medicine

## 2020-03-05 ENCOUNTER — Other Ambulatory Visit: Payer: Self-pay

## 2020-03-05 ENCOUNTER — Other Ambulatory Visit: Payer: Self-pay | Admitting: *Deleted

## 2020-03-05 ENCOUNTER — Encounter: Payer: Self-pay | Admitting: Family

## 2020-03-05 ENCOUNTER — Ambulatory Visit (INDEPENDENT_AMBULATORY_CARE_PROVIDER_SITE_OTHER): Payer: Medicare Other

## 2020-03-05 ENCOUNTER — Ambulatory Visit: Payer: Medicare Other | Admitting: Internal Medicine

## 2020-03-05 VITALS — BP 168/60 | HR 78 | Temp 98.4°F | Resp 16 | Ht 68.0 in | Wt 141.2 lb

## 2020-03-05 DIAGNOSIS — N184 Chronic kidney disease, stage 4 (severe): Secondary | ICD-10-CM

## 2020-03-05 DIAGNOSIS — M545 Low back pain, unspecified: Secondary | ICD-10-CM | POA: Insufficient documentation

## 2020-03-05 DIAGNOSIS — C221 Intrahepatic bile duct carcinoma: Secondary | ICD-10-CM

## 2020-03-05 DIAGNOSIS — I10 Essential (primary) hypertension: Secondary | ICD-10-CM | POA: Diagnosis not present

## 2020-03-05 DIAGNOSIS — I119 Hypertensive heart disease without heart failure: Secondary | ICD-10-CM

## 2020-03-05 DIAGNOSIS — S32020A Wedge compression fracture of second lumbar vertebra, initial encounter for closed fracture: Secondary | ICD-10-CM | POA: Diagnosis not present

## 2020-03-05 DIAGNOSIS — E1122 Type 2 diabetes mellitus with diabetic chronic kidney disease: Secondary | ICD-10-CM

## 2020-03-05 LAB — URINALYSIS, ROUTINE W REFLEX MICROSCOPIC
Bilirubin Urine: NEGATIVE
Ketones, ur: NEGATIVE
Leukocytes,Ua: NEGATIVE
Nitrite: NEGATIVE
RBC / HPF: NONE SEEN (ref 0–?)
Specific Gravity, Urine: 1.01 (ref 1.000–1.030)
Total Protein, Urine: 100 — AB
Urine Glucose: NEGATIVE
Urobilinogen, UA: 0.2 (ref 0.0–1.0)
pH: 6 (ref 5.0–8.0)

## 2020-03-05 LAB — COMPREHENSIVE METABOLIC PANEL
ALT: 27 U/L (ref 0–53)
AST: 36 U/L (ref 0–37)
Albumin: 3.6 g/dL (ref 3.5–5.2)
Alkaline Phosphatase: 198 U/L — ABNORMAL HIGH (ref 39–117)
BUN: 41 mg/dL — ABNORMAL HIGH (ref 6–23)
CO2: 22 mEq/L (ref 19–32)
Calcium: 8.7 mg/dL (ref 8.4–10.5)
Chloride: 95 mEq/L — ABNORMAL LOW (ref 96–112)
Creatinine, Ser: 2.93 mg/dL — ABNORMAL HIGH (ref 0.40–1.50)
GFR: 20.2 mL/min — ABNORMAL LOW (ref >60.00)
Glucose, Bld: 123 mg/dL — ABNORMAL HIGH (ref 70–99)
Potassium: 5.3 mEq/L — ABNORMAL HIGH (ref 3.5–5.1)
Sodium: 125 mEq/L — ABNORMAL LOW (ref 135–145)
Total Bilirubin: 0.5 mg/dL (ref 0.2–1.2)
Total Protein: 7.2 g/dL (ref 6.0–8.3)

## 2020-03-05 LAB — CBC WITH DIFFERENTIAL/PLATELET
Basophils Absolute: 0 10*3/uL (ref 0.0–0.1)
Basophils Relative: 0.6 % (ref 0.0–3.0)
Eosinophils Absolute: 0.4 10*3/uL (ref 0.0–0.7)
Eosinophils Relative: 5.9 % — ABNORMAL HIGH (ref 0.0–5.0)
HCT: 30.8 % — ABNORMAL LOW (ref 39.0–52.0)
Hemoglobin: 10.2 g/dL — ABNORMAL LOW (ref 13.0–17.0)
Lymphocytes Relative: 9.9 % — ABNORMAL LOW (ref 12.0–46.0)
Lymphs Abs: 0.6 10*3/uL — ABNORMAL LOW (ref 0.7–4.0)
MCHC: 33.1 g/dL (ref 30.0–36.0)
MCV: 96.3 fl (ref 78.0–100.0)
Monocytes Absolute: 0.9 10*3/uL (ref 0.1–1.0)
Monocytes Relative: 14.7 % — ABNORMAL HIGH (ref 3.0–12.0)
Neutro Abs: 4.3 10*3/uL (ref 1.4–7.7)
Neutrophils Relative %: 68.9 % (ref 43.0–77.0)
Platelets: 151 10*3/uL (ref 150.0–400.0)
RBC: 3.2 Mil/uL — ABNORMAL LOW (ref 4.22–5.81)
RDW: 16.9 % — ABNORMAL HIGH (ref 11.5–15.5)
WBC: 6.2 10*3/uL (ref 4.0–10.5)

## 2020-03-05 LAB — MAGNESIUM: Magnesium: 1.8 mg/dL (ref 1.5–2.5)

## 2020-03-05 MED ORDER — HYDROCODONE-ACETAMINOPHEN 10-325 MG PO TABS: 1.0000 | ORAL_TABLET | Freq: Four times a day (QID) | ORAL | 0 refills | Status: AC | PRN

## 2020-03-05 NOTE — Progress Notes (Signed)
Subjective:  Patient ID: Gerald Jacobs, male    DOB: 10-04-1925  Age: 86 y.o. MRN: [id number]  CC: Back Pain  This visit occurred during the SARS-CoV-2 public health emergency.  Safety protocols were in place, including screening questions prior to the visit, additional usage of staff PPE, and extensive cleaning of exam room while observing appropriate contact time as indicated for disinfecting solutions.   NEW TO ME  HPI Tarik JELAN BATTERTON presents for new onset left lower back pain.  He leaned over a week ago and felt the acute onset of sharp left lower back pain that does not radiate into his extremities.  He saw someone else and was prescribed a muscle relaxer which has not helped much.  He denies paresthesias.  Outpatient Medications Prior to Visit  Medication Sig Dispense Refill   allopurinol (ZYLOPRIM) 100 MG tablet TAKE 1 TABLET BY MOUTH EVERY DAY (Patient taking differently: Take 100 mg by mouth daily. ) 90 tablet 0   amLODipine (NORVASC) 5 MG tablet TAKE 1 TABLET BY MOUTH EVERY DAY (Patient taking differently: Take 2.5 mg by mouth daily. ) 90 tablet 1   cholecalciferol (VITAMIN D) 1000 units tablet Take 1,000 Units by mouth 2 (two) times daily.      ELIQUIS 2.5 MG TABS tablet TAKE 1 TABLET BY MOUTH TWICE A DAY 180 tablet 1   epoetin alfa (EPOGEN,PROCRIT) 83151 UNIT/ML injection Inject 10,000 Units into the vein once a week.     furosemide (LASIX) 20 MG tablet Take daily as directed 30 tablet 0   glucose blood (ONE TOUCH ULTRA TEST) test strip USE ONE STRIP PER TEST. CHECK BLOOD SUGARS 1-4 TIMES PER DAY AS INSTRUCTED. 100 each 4   JANUVIA 50 MG tablet TAKE 1 TABLET BY MOUTH EVERY DAY (Patient taking differently: Take 50 mg by mouth daily. ) 90 tablet 1   metoprolol succinate (TOPROL-XL) 50 MG 24 hr tablet TAKE 1 TABLET (50 MG TOTAL) BY MOUTH 2 (TWO) TIMES DAILY. TAKE WITH OR IMMEDIATELY FOLLOWING A MEAL. 180 tablet 3   mupirocin ointment (BACTROBAN) 2 % Apply 1 application  topically 2 (two) times daily. 22 g 0   Omega-3 Fatty Acids (FISH OIL) 1000 MG CAPS Take 1,000 mg by mouth at bedtime.      ondansetron (ZOFRAN) 8 MG tablet Take by mouth every 8 (eight) hours as needed for nausea or vomiting.     OneTouch Delica Lancets 76H MISC TEST BLOOD SUGARS 1-4 TIMES A DAY AS INSTRUCTED. DX CODE: E11.9 100 each 3   promethazine (PHENERGAN) 12.5 MG tablet Take 12.5 mg by mouth every 6 (six) hours as needed for nausea or vomiting.     protein supplement shake (PREMIER PROTEIN) LIQD Take 2 oz by mouth daily as needed.      tizanidine (ZANAFLEX) 2 MG capsule Take 1 capsule (2 mg total) by mouth 3 (three) times daily as needed for muscle spasms. 30 capsule 0   triamcinolone cream (KENALOG) 0.1 % Apply 1 application topically 2 (two) times daily.      diphenhydramine-acetaminophen (TYLENOL PM) 25-500 MG TABS tablet Take 1 tablet by mouth at bedtime as needed (pain).     sodium chloride (SALINE MIST) 0.65 % nasal spray Place 1 spray into the nose at bedtime.      pantoprazole (PROTONIX) 40 MG tablet Take 40 mg by mouth daily as needed (indigestion).      No facility-administered medications prior to visit.    ROS Review of Systems  Constitutional: Positive for fatigue. Negative for chills, diaphoresis and fever.  HENT: Negative.   Eyes: Negative for visual disturbance.  Respiratory: Negative for cough, chest tightness, shortness of breath and wheezing.   Cardiovascular: Negative for chest pain, palpitations and leg swelling.  Gastrointestinal: Negative for abdominal pain, constipation, diarrhea, nausea and vomiting.  Endocrine: Negative.   Genitourinary: Negative.  Negative for difficulty urinating and hematuria.  Musculoskeletal: Positive for back pain.  Skin: Negative for color change and rash.  Neurological: Negative.  Negative for dizziness, weakness, light-headedness and numbness.  Hematological: Negative for adenopathy. Does not bruise/bleed easily.    Psychiatric/Behavioral: Negative.     Objective:  BP (!) 168/60 (BP Location: Left Arm, Patient Position: Sitting, Cuff Size: Normal)    Pulse 78    Temp 98.4 F (36.9 C) (Oral)    Resp 16    Ht '5\' 8"'$  (1.727 m)    Wt 141 lb 4 oz (64.1 kg)    SpO2 98%    BMI 21.48 kg/m   BP Readings from Last 3 Encounters:  03/05/20 (!) 168/60  02/25/20 (!) 150/57  02/18/20 (!) 152/58    Wt Readings from Last 3 Encounters:  03/05/20 141 lb 4 oz (64.1 kg)  01/21/20 142 lb 12.8 oz (64.8 kg)  10/30/19 140 lb (63.5 kg)    Physical Exam Vitals reviewed.  Constitutional:      General: He is not in acute distress.    Appearance: He is ill-appearing. He is not toxic-appearing or diaphoretic.  HENT:     Nose: Nose normal.     Mouth/Throat:     Mouth: Mucous membranes are moist.  Eyes:     General: No scleral icterus.    Conjunctiva/sclera: Conjunctivae normal.  Cardiovascular:     Rate and Rhythm: Normal rate and regular rhythm.     Heart sounds: No murmur.  Pulmonary:     Effort: Pulmonary effort is normal.     Breath sounds: No stridor. No wheezing, rhonchi or rales.  Abdominal:     General: Abdomen is flat.     Palpations: There is no mass.     Tenderness: There is no abdominal tenderness. There is no guarding.  Musculoskeletal:        General: Normal range of motion.     Cervical back: Normal and neck supple.     Thoracic back: Normal.     Lumbar back: Tenderness and bony tenderness present. No swelling, edema, deformity or signs of trauma. Normal range of motion. Negative right straight leg raise test and negative left straight leg raise test.     Right lower leg: No edema.     Comments: TTP over left lumbar region  Lymphadenopathy:     Cervical: No cervical adenopathy.  Skin:    General: Skin is warm and dry.  Neurological:     General: No focal deficit present.     Mental Status: He is alert.     Sensory: No sensory deficit.     Motor: No weakness.     Coordination:  Coordination normal.     Deep Tendon Reflexes: Reflexes normal.  Psychiatric:        Mood and Affect: Mood normal.        Behavior: Behavior normal.     Lab Results  Component Value Date   WBC 6.2 03/05/2020   HGB 10.2 (L) 03/05/2020   HCT 30.8 (L) 03/05/2020   PLT 151.0 03/05/2020   GLUCOSE 131 (H) 02/21/2020  CHOL 195 02/08/2017   TRIG 154.0 (H) 02/08/2017   HDL 74.00 02/08/2017   LDLCALC 90 02/08/2017   ALT 32 02/21/2020   AST 40 (H) 02/21/2020   NA 130 (L) 02/21/2020   K 5.0 02/21/2020   CL 100 02/21/2020   CREATININE 3.46 (H) 02/21/2020   BUN 49 (H) 02/21/2020   CO2 24 02/21/2020   TSH 1.990 01/21/2020   PSA 0.01 (L) 02/08/2017   INR 1.06 06/19/2017   HGBA1C 5.8 12/03/2018   MICROALBUR 92.6 (H) 05/21/2018    DG Lumbar Spine Complete  Result Date: 03/05/2020 CLINICAL DATA:  86 year old male with low back pain. EXAM: LUMBAR SPINE - COMPLETE 4+ VIEW COMPARISON:  CT abdomen pelvis dated 01/21/2020. FINDINGS: Five lumbar type vertebra. There is mild compression fracture of the superior endplate of L2, age indeterminate but appears new since the study of 01/21/2020. Correlation with point tenderness recommended. No other acute fracture identified. The bones are osteopenic. There is multilevel degenerative changes with disc space narrowing and endplate irregularity and spurring. Lower lumbar facet arthropathy. There is dextroscoliosis centered at L3-L4. Biliary stents noted. The soft tissues are unremarkable. IMPRESSION: Mild compression fracture of the superior endplate of L2, age indeterminate but appears new since the study of 01/21/2020. Correlation with point tenderness recommended. Electronically Signed   By: Anner Crete M.D.   On: 03/05/2020 15:46    Assessment & Plan:   Khylon was seen today for back pain.  Diagnoses and all orders for this visit:  Benign hypertensive heart disease without heart failure  Essential hypertension  Intrahepatic  cholangiocarcinoma (HCC)  Type 2 diabetes mellitus with stage 4 chronic kidney disease, without long-term current use of insulin (HCC)  CKD (chronic kidney disease), stage IV (HCC) -     Comp Met (CMET); Future -     Magnesium; Future -     Urinalysis; Future -     Urinalysis -     Magnesium -     Comp Met (CMET)  Acute left-sided low back pain without sciatica- He has acute onset left lower back pain with a plain film that shows a new L2 compression fracture.  There are no radicular symptoms and he is neurologically intact.  NSAIDs are contraindicated.  He has not gotten much symptom relief with the muscle laxer.  I recommended that he treat the pain with hydrocodone and acetaminophen. -     DG Lumbar Spine Complete; Future -     HYDROcodone-acetaminophen (NORCO) 10-325 MG tablet; Take 1 tablet by mouth every 6 (six) hours as needed for up to 7 days.  Closed wedge compression fracture of L2 vertebra, initial encounter (HCC) -     HYDROcodone-acetaminophen (NORCO) 10-325 MG tablet; Take 1 tablet by mouth every 6 (six) hours as needed for up to 7 days.   I have discontinued Renne V. Gesner's diphenhydramine-acetaminophen and sodium chloride. I am also having him start on HYDROcodone-acetaminophen. Additionally, I am having him maintain his cholecalciferol, pantoprazole, ondansetron, promethazine, protein supplement shake, epoetin alfa, OneTouch Delica Lancets 71I, mupirocin ointment, glucose blood, amLODipine, Januvia, allopurinol, triamcinolone cream, Fish Oil, metoprolol succinate, furosemide, Eliquis, and tizanidine.  Meds ordered this encounter  Medications   HYDROcodone-acetaminophen (NORCO) 10-325 MG tablet    Sig: Take 1 tablet by mouth every 6 (six) hours as needed for up to 7 days.    Dispense:  35 tablet    Refill:  0     Follow-up: Return in about 4 weeks (around 04/02/2020).  Marcello Moores  Ronnald Ramp, MD

## 2020-03-05 NOTE — Patient Instructions (Signed)
Lumbar Spine Fracture A lumbar spine fracture is a break in one of the bones of the lower back. Lumbar spine fractures can vary from mild to severe. The most severe types are those that:  Cause the broken bones to move out of place (unstable).  Injure or press on the spinal cord. During recovery, it is normal to have pain and stiffness in the lower back for weeks. What are the causes? This condition may be caused by:  A fall.  A car accident.  A gunshot wound.  A hard, direct hit to the back. What increases the risk? You are more likely to develop this condition if:  You are in a situation that could result in a fall or other violent injury.  You have a condition that causes weakness in the bones (osteoporosis). What are the signs or symptoms? The main symptom of this condition is severe pain in the lower back. If a fracture is complex or severe, there may also be:  A misshapen or swollen area on the lower back.  Limited ability to move an area of the lower back.  Inability to empty the bladder (urinary retention).  Loss of bowel or bladder control (incontinence).  Loss of strength or sensation in the legs, feet, and toes.  Inability to move (paralysis). How is this diagnosed? This condition is diagnosed based on:  A physical exam.  Symptoms and what happened just before they developed.  The results of imaging tests, such as an X-ray, CT scan, or MRI. If your nerves have been damaged, you may also have other tests to find out the extent of the damage. How is this treated? Treatment for this condition depends on how severe the injury is. Most fractures can be treated with:  A back brace.  Bed rest and activity restrictions.  Pain medicine.  Physical therapy. Fractures that are complex, involve multiple bones, or make the spine unstable may require surgery. Surgery is done:  To remove pressure from the nerves or spinal cord.  To stabilize the broken pieces of  bone. Follow these instructions at home: Medicines  Take over-the-counter and prescription medicines only as told by your health care provider.  Do not drive or use heavy machinery while taking prescription pain medicine.  If you are taking prescription pain medicine, take actions to prevent or treat constipation. Your health care provider may recommend that you: ? Drink enough fluid to keep your urine pale yellow. ? Eat foods that are high in fiber, such as fresh fruits and vegetables, whole grains, and beans. ? Limit foods that are high in fat and processed sugars, such as fried or sweet foods. ? Take an over-the-counter or prescription medicine for constipation. If you have a brace:  Wear the back brace as told by your health care provider. Remove it only as told by your health care provider.  Keep the brace clean.  If the brace is not waterproof: ? Do not let it get wet. ? Cover it with a watertight covering when you take a bath or a shower. Activity  Stay in bed (on bed rest) only as directed by your health care provider.  Do exercises to improve motion and strength in your back (physical therapy), if your health care provider tells you to do so.  Return to your normal activities as directed by your health care provider. Ask your health care provider what activities are safe for you. Managing pain, stiffness, and swelling   If directed, put ice  on the injured area: ? If you have a removable brace, remove it as told by your health care provider. ? Put ice in a plastic bag. ? Place a towel between your skin and the bag. ? Leave the ice on for 20 minutes, 2-3 times a day. General instructions  Do not use any products that contain nicotine or tobacco, such as cigarettes and e-cigarettes. These can delay healing after injury. If you need help quitting, ask your health care provider.  Do not drink alcohol. Alcohol can interfere with your treatment.  Keep all follow-up visits  as directed by your health care provider. This is important. ? Failing to follow up as recommended could result in permanent injury, disability, or long-lasting (chronic) pain. Contact a health care provider if:  You have a fever.  Your pain medicine is not helping.  Your pain does not get better over time.  You cannot return to your normal activities as planned or expected. Get help right away if:  You have difficulty breathing.  Your pain is very bad and it suddenly gets worse.  You have numbness, tingling, or weakness in any part of your body.  You are unable to empty your bladder.  You cannot control your bladder or bowels.  You are unable to move any body part (paralysis) that is below the level of your injury.  You vomit.  You have pain in your abdomen. Summary  A lumbar spine fracture is a break in one of the bones of the lower back.  The main symptom of this condition is severe pain in the lower back. If a fracture is complex, there may also be numbness, tingling, or paralysis in the legs.  Treatment depends on how severe the injury is. Most fractures can be treated with a back brace, bed rest and activity restrictions, pain medicine, and physical therapy.  Fractures that are complex, involve multiple bones, or make the spine unstable may require surgery. This information is not intended to replace advice given to you by your health care provider. Make sure you discuss any questions you have with your health care provider. Document Revised: 12/30/2017 Document Reviewed: 12/30/2017 Elsevier Patient Education  2020 [employer]  

## 2020-03-06 ENCOUNTER — Encounter: Payer: Self-pay | Admitting: Family

## 2020-03-06 ENCOUNTER — Ambulatory Visit: Payer: Medicare Other | Admitting: Family

## 2020-03-08 ENCOUNTER — Other Ambulatory Visit: Payer: Self-pay | Admitting: Family

## 2020-03-09 ENCOUNTER — Telehealth: Payer: Self-pay | Admitting: Family

## 2020-03-09 ENCOUNTER — Other Ambulatory Visit: Payer: Self-pay | Admitting: Family

## 2020-03-09 ENCOUNTER — Encounter: Payer: Self-pay | Admitting: Family

## 2020-03-09 DIAGNOSIS — S32000K Wedge compression fracture of unspecified lumbar vertebra, subsequent encounter for fracture with nonunion: Secondary | ICD-10-CM

## 2020-03-09 NOTE — Telephone Encounter (Signed)
-----   Message from Elmarie Shiley, MD sent at 03/06/2020  5:49 PM EDT ----- Regarding: RE: Nothing different. If he still has edema - would recommend lasix 40mg  2 times a week (which will also help defend against both hyponatremia and hyperkalemia.  Thank you Gerald Jacobs ----- Message ----- From: Marrian Salvage, FNP Sent: 03/06/2020   5:07 PM EDT To: Elmarie Shiley, MD  Dr. Posey Pronto,  I hope you are doing okay. When you get a chance, can you let me know if there is anything different you want me to do for Gerald Jacobs.  Labs are stable- Mg 1.8; Na 125 K 5.3 ( not hemolyzed) Cr 2.93  I was going to re-check in 2-4 weeks depending on your thoughts. He is supposed to do in office follow-up in early May about back pain.   Have a good weekend- Gerald Mourning, FNP

## 2020-03-10 ENCOUNTER — Inpatient Hospital Stay (HOSPITAL_COMMUNITY): Admission: RE | Admit: 2020-03-10 | Payer: Medicare Other | Source: Ambulatory Visit

## 2020-03-15 ENCOUNTER — Encounter: Payer: Self-pay | Admitting: Family

## 2020-03-16 ENCOUNTER — Encounter: Payer: Self-pay | Admitting: Family

## 2020-03-16 ENCOUNTER — Telehealth: Payer: Self-pay

## 2020-03-16 NOTE — Telephone Encounter (Signed)
Called daughter she had also sent mychart msg and she states Helane Gunther her back as well. Dad has an appt  To see the neurosurgeon tomorrow art 2. Closing this encounter.Marland KitchenJohny Chess

## 2020-03-16 NOTE — Telephone Encounter (Signed)
New message    The daughter [name] calling    Her dad was seen for compression fracture L-2 on 4.8.21  with Dr. Ronnald Ramp, the Daughter voiced he not getting better needs to discuss options.

## 2020-03-17 ENCOUNTER — Other Ambulatory Visit: Payer: Self-pay | Admitting: Family

## 2020-03-17 ENCOUNTER — Other Ambulatory Visit (INDEPENDENT_AMBULATORY_CARE_PROVIDER_SITE_OTHER): Payer: Medicare Other

## 2020-03-17 ENCOUNTER — Inpatient Hospital Stay (HOSPITAL_COMMUNITY): Admission: RE | Admit: 2020-03-17 | Payer: Medicare Other | Source: Ambulatory Visit

## 2020-03-17 ENCOUNTER — Encounter: Payer: Self-pay | Admitting: Family

## 2020-03-17 DIAGNOSIS — C221 Intrahepatic bile duct carcinoma: Secondary | ICD-10-CM | POA: Diagnosis not present

## 2020-03-17 LAB — CBC WITH DIFFERENTIAL/PLATELET
Basophils Absolute: 0 10*3/uL (ref 0.0–0.1)
Basophils Relative: 0.7 % (ref 0.0–3.0)
Eosinophils Absolute: 0.3 10*3/uL (ref 0.0–0.7)
Eosinophils Relative: 5.1 % — ABNORMAL HIGH (ref 0.0–5.0)
HCT: 27.2 % — ABNORMAL LOW (ref 39.0–52.0)
Hemoglobin: 9 g/dL — ABNORMAL LOW (ref 13.0–17.0)
Lymphocytes Relative: 11.2 % — ABNORMAL LOW (ref 12.0–46.0)
Lymphs Abs: 0.6 10*3/uL — ABNORMAL LOW (ref 0.7–4.0)
MCHC: 33 g/dL (ref 30.0–36.0)
MCV: 95.2 fl (ref 78.0–100.0)
Monocytes Absolute: 0.8 10*3/uL (ref 0.1–1.0)
Monocytes Relative: 15.1 % — ABNORMAL HIGH (ref 3.0–12.0)
Neutro Abs: 3.8 10*3/uL (ref 1.4–7.7)
Neutrophils Relative %: 67.9 % (ref 43.0–77.0)
Platelets: 125 10*3/uL — ABNORMAL LOW (ref 150.0–400.0)
RBC: 2.85 Mil/uL — ABNORMAL LOW (ref 4.22–5.81)
RDW: 16.4 % — ABNORMAL HIGH (ref 11.5–15.5)
WBC: 5.6 10*3/uL (ref 4.0–10.5)

## 2020-03-17 LAB — COMPREHENSIVE METABOLIC PANEL
ALT: 26 U/L (ref 0–53)
AST: 33 U/L (ref 0–37)
Albumin: 3.5 g/dL (ref 3.5–5.2)
Alkaline Phosphatase: 203 U/L — ABNORMAL HIGH (ref 39–117)
BUN: 44 mg/dL — ABNORMAL HIGH (ref 6–23)
CO2: 23 mEq/L (ref 19–32)
Calcium: 8.6 mg/dL (ref 8.4–10.5)
Chloride: 93 mEq/L — ABNORMAL LOW (ref 96–112)
Creatinine, Ser: 3.48 mg/dL — ABNORMAL HIGH (ref 0.40–1.50)
GFR: 16.56 mL/min — ABNORMAL LOW (ref >60.00)
Glucose, Bld: 145 mg/dL — ABNORMAL HIGH (ref 70–99)
Potassium: 5.1 mEq/L (ref 3.5–5.1)
Sodium: 123 mEq/L — ABNORMAL LOW (ref 135–145)
Total Bilirubin: 0.5 mg/dL (ref 0.2–1.2)
Total Protein: 7.1 g/dL (ref 6.0–8.3)

## 2020-03-18 ENCOUNTER — Encounter: Payer: Self-pay | Admitting: Family

## 2020-03-19 ENCOUNTER — Other Ambulatory Visit: Payer: Self-pay | Admitting: Family

## 2020-03-19 ENCOUNTER — Encounter: Payer: Self-pay | Admitting: Family

## 2020-03-19 ENCOUNTER — Telehealth: Payer: Self-pay

## 2020-03-19 DIAGNOSIS — S32000S Wedge compression fracture of unspecified lumbar vertebra, sequela: Secondary | ICD-10-CM

## 2020-03-19 NOTE — Telephone Encounter (Signed)
Sent you a staff message on this;  I have no information on the compression fracture- they are working with the neurosurgeon.

## 2020-03-19 NOTE — Telephone Encounter (Signed)
New message    Gerald Jacobs calling needs NP [name] to call her back to discuss her father as soon as she can.   C/o compression fracture

## 2020-03-19 NOTE — Telephone Encounter (Signed)
Called Mrs. Gerald Jacobs gave her your response. She states Dr. Trenton Gammon ordered an MRI, but they are not able to do the MRI until next week sometime bcz their Imaging truck doesn't come but three days a week. He advised her to contact his PCP to see if they can order an MRI and get him in sooner somewhere else. She states she does not want to take him to ER to expose him to other stuff. She states if MRI is ordered that they can see about getting it done in [place] where her husband is an physician. Inform daughter will send Gerald Jacobs a msg to see, but once again she is not back in the office until Monday.Marland KitchenJohny Jacobs

## 2020-03-22 ENCOUNTER — Encounter: Payer: Self-pay | Admitting: Family

## 2020-03-23 ENCOUNTER — Other Ambulatory Visit: Payer: Self-pay | Admitting: Family

## 2020-03-23 ENCOUNTER — Encounter: Payer: Self-pay | Admitting: Family

## 2020-03-23 DIAGNOSIS — C221 Intrahepatic bile duct carcinoma: Secondary | ICD-10-CM

## 2020-03-24 ENCOUNTER — Encounter (HOSPITAL_COMMUNITY): Payer: Medicare Other

## 2020-03-26 ENCOUNTER — Ambulatory Visit (HOSPITAL_COMMUNITY)
Admission: RE | Admit: 2020-03-26 | Discharge: 2020-03-26 | Disposition: A | Discharge status: Home / Self Care | Payer: Medicare Other | Source: Ambulatory Visit | Attending: Nephrology | Admitting: Nephrology

## 2020-03-26 ENCOUNTER — Other Ambulatory Visit: Payer: Self-pay | Admitting: Family

## 2020-03-26 ENCOUNTER — Other Ambulatory Visit (INDEPENDENT_AMBULATORY_CARE_PROVIDER_SITE_OTHER): Payer: Medicare Other

## 2020-03-26 ENCOUNTER — Other Ambulatory Visit: Payer: Self-pay

## 2020-03-26 VITALS — BP 138/60 | HR 70 | Temp 96.4°F | Resp 20

## 2020-03-26 DIAGNOSIS — C221 Intrahepatic bile duct carcinoma: Secondary | ICD-10-CM | POA: Diagnosis not present

## 2020-03-26 DIAGNOSIS — D649 Anemia, unspecified: Secondary | ICD-10-CM

## 2020-03-26 DIAGNOSIS — D631 Anemia in chronic kidney disease: Secondary | ICD-10-CM | POA: Insufficient documentation

## 2020-03-26 DIAGNOSIS — N184 Chronic kidney disease, stage 4 (severe): Secondary | ICD-10-CM | POA: Diagnosis present

## 2020-03-26 LAB — CBC WITH DIFFERENTIAL/PLATELET
Basophils Absolute: 0 10*3/uL (ref 0.0–0.1)
Basophils Relative: 0.6 % (ref 0.0–3.0)
Eosinophils Absolute: 0.3 10*3/uL (ref 0.0–0.7)
Eosinophils Relative: 4.9 % (ref 0.0–5.0)
HCT: 25.2 % — ABNORMAL LOW (ref 39.0–52.0)
Hemoglobin: 8.4 g/dL — ABNORMAL LOW (ref 13.0–17.0)
Lymphocytes Relative: 10.5 % — ABNORMAL LOW (ref 12.0–46.0)
Lymphs Abs: 0.7 10*3/uL (ref 0.7–4.0)
MCHC: 33.4 g/dL (ref 30.0–36.0)
MCV: 94.2 fl (ref 78.0–100.0)
Monocytes Absolute: 0.9 10*3/uL (ref 0.1–1.0)
Monocytes Relative: 14.2 % — ABNORMAL HIGH (ref 3.0–12.0)
Neutro Abs: 4.4 10*3/uL (ref 1.4–7.7)
Neutrophils Relative %: 69.8 % (ref 43.0–77.0)
Platelets: 157 10*3/uL (ref 150.0–400.0)
RBC: 2.67 Mil/uL — ABNORMAL LOW (ref 4.22–5.81)
RDW: 16.4 % — ABNORMAL HIGH (ref 11.5–15.5)
WBC: 6.3 10*3/uL (ref 4.0–10.5)

## 2020-03-26 LAB — COMPREHENSIVE METABOLIC PANEL
ALT: 29 U/L (ref 0–53)
AST: 34 U/L (ref 0–37)
Albumin: 3.5 g/dL (ref 3.5–5.2)
Alkaline Phosphatase: 206 U/L — ABNORMAL HIGH (ref 39–117)
BUN: 48 mg/dL — ABNORMAL HIGH (ref 6–23)
CO2: 25 mEq/L (ref 19–32)
Calcium: 8.7 mg/dL (ref 8.4–10.5)
Chloride: 96 mEq/L (ref 96–112)
Creatinine, Ser: 3.23 mg/dL — ABNORMAL HIGH (ref 0.40–1.50)
GFR: 18.05 mL/min — ABNORMAL LOW (ref >60.00)
Glucose, Bld: 153 mg/dL — ABNORMAL HIGH (ref 70–99)
Potassium: 4.9 mEq/L (ref 3.5–5.1)
Sodium: 127 mEq/L — ABNORMAL LOW (ref 135–145)
Total Bilirubin: 0.5 mg/dL (ref 0.2–1.2)
Total Protein: 7 g/dL (ref 6.0–8.3)

## 2020-03-26 LAB — IRON AND TIBC
Iron: 74 ug/dL (ref 45–182)
Saturation Ratios: 26 % (ref 17.9–39.5)
TIBC: 286 ug/dL (ref 250–450)
UIBC: 212 ug/dL

## 2020-03-26 LAB — FERRITIN: Ferritin: 316 ng/mL (ref 24–336)

## 2020-03-26 LAB — POCT HEMOGLOBIN-HEMACUE: Hemoglobin: 8.9 g/dL — ABNORMAL LOW (ref 13.0–17.0)

## 2020-03-26 MED ORDER — EPOETIN ALFA 10000 UNIT/ML IJ SOLN
INTRAMUSCULAR | Status: AC
  Administered 2020-03-26: 10000 [IU]
  Filled 2020-03-26: qty 1

## 2020-03-26 MED ORDER — EPOETIN ALFA 10000 UNIT/ML IJ SOLN: 10000.0000 [IU] | INTRAMUSCULAR | Status: DC

## 2020-03-27 ENCOUNTER — Telehealth: Payer: Self-pay | Admitting: *Deleted

## 2020-03-27 NOTE — Telephone Encounter (Signed)
  Daughter is calling to check on the clearance because Dr. Marchelle Folks office said her dad would need to be off the Eliquis starting Sunday and they told her to call our office.

## 2020-03-27 NOTE — Telephone Encounter (Signed)
   [facility] Pre-operative Risk Assessment    Request for surgical clearance:  1. What type of surgery is being performed? L2 KYHOPLASTY   2. When is this surgery scheduled? 04/02/20   3. What type of clearance is required (medical clearance vs. Pharmacy clearance to hold med vs. Both)? BOTH  4. Are there any medications that need to be held prior to surgery and how long? ELIQUIS   5. Practice name and name of physician performing surgery? [facility]: [facility]   6. What is your office phone number [phone]    7.   What is your office fax number [phone] ATTN: VANESSA X [phone]  8.   Anesthesia type (None, local, MAC, general) ? GENERAL   Julaine Hua 03/27/2020, 2:56 PM  _________________________________________________________________   (provider comments below)

## 2020-03-30 NOTE — Telephone Encounter (Signed)
   Primary Cardiologist: Mertie Moores, MD  Chart reviewed as part of pre-operative protocol coverage. Patient was contacted 03/30/2020 in reference to pre-operative risk assessment for pending surgery as outlined below.  Gerald Jacobs was last seen on 10/04/19 by Dr. Acie Fredrickson.  Since that day, Gerald Jacobs has done fairly well from a cardiac standpoint. He has been quite limited in his functional ability 2/2 his back pain. Lives at home with his wife. Low risk procedure but unable to complete 4 METS. Would favor clearing without further work up but will route to Dr. Acie Fredrickson for further input.  Of note, I spoke with his daughter who reports they stopped his Eliquis on Sunday 03/29/20 in anticipation for procedure. Noted Pharmacy recommendations to stop 3-4 days prior.    I will route this recommendation to the requesting party via Epic fax function and remove from pre-op pool once reply received from MD  Dr. Acie Fredrickson can you please advise and route response to P CV DIV PREOP   Gerald Bellis, NP 03/30/2020, 10:33 AM

## 2020-03-30 NOTE — Telephone Encounter (Signed)
Pt takes Eliquis for afib with CHADS2VASc score of 5 (age x2, HTN, DM, CAD). SCr 3.23, CrCl is 13 mL/min. Pt on appropriately reduced dose of Eliquis.  Recommend holding Eliquis for 3-4 days prior to procedure.

## 2020-03-30 NOTE — Telephone Encounter (Signed)
Pt is at low risk for procedure I agree that holding Eliquis for 3 days should be adequate for procedure. It sounds like daughter has already stopped it

## 2020-03-30 NOTE — Telephone Encounter (Signed)
Chart reviewed as part of pre-operative protocol coverage. Patient was contacted 03/30/2020 in reference to pre-operative risk assessment for pending surgery as outlined below.  Gerald Jacobs was last seen on 10/04/19 by Dr. Acie Fredrickson.  Since that day, Gerald Jacobs has done fairly well from a cardiac standpoint. He has been quite limited in his functional ability 2/2 his back pain. Lives at home with his wife. Low risk procedure but unable to complete 4 METS. Per Dr. Acie Fredrickson he is low risk for procedure, no plans for further cardiac testing.  Of note, I spoke with his daughter who reports they stopped his Eliquis on Sunday 03/29/20 in anticipation for procedure. Noted Pharmacy recommendations to stop 3-4 days prior.   I will route this recommendation to the requesting party via Epic fax function and remove from pre-op pool.  Callback- please update the patient/surgeon office regarding recommendations given plans for procedure on 5/6. Thanks  Signed, Reino Bellis, NP-C 03/30/2020, 3:24 PM

## 2020-04-01 ENCOUNTER — Encounter (HOSPITAL_COMMUNITY): Payer: Medicare Other

## 2020-04-02 ENCOUNTER — Other Ambulatory Visit: Payer: Self-pay

## 2020-04-02 ENCOUNTER — Encounter (HOSPITAL_COMMUNITY)
Admission: RE | Admit: 2020-04-02 | Discharge: 2020-04-02 | Disposition: A | Discharge status: Home / Self Care | Payer: Medicare Other | Source: Ambulatory Visit | Attending: Nephrology | Admitting: Nephrology

## 2020-04-02 ENCOUNTER — Encounter (HOSPITAL_COMMUNITY): Payer: Medicare Other

## 2020-04-02 VITALS — BP 156/63 | HR 68 | Temp 96.5°F | Resp 20

## 2020-04-02 DIAGNOSIS — N184 Chronic kidney disease, stage 4 (severe): Secondary | ICD-10-CM | POA: Diagnosis present

## 2020-04-02 DIAGNOSIS — D631 Anemia in chronic kidney disease: Secondary | ICD-10-CM | POA: Diagnosis not present

## 2020-04-02 DIAGNOSIS — D649 Anemia, unspecified: Secondary | ICD-10-CM | POA: Insufficient documentation

## 2020-04-02 LAB — POCT HEMOGLOBIN-HEMACUE: Hemoglobin: 8.4 g/dL — ABNORMAL LOW (ref 13.0–17.0)

## 2020-04-02 MED ORDER — EPOETIN ALFA 10000 UNIT/ML IJ SOLN: 10000.0000 [IU] | INTRAMUSCULAR | Status: DC

## 2020-04-02 MED ORDER — EPOETIN ALFA 10000 UNIT/ML IJ SOLN
INTRAMUSCULAR | Status: AC
  Administered 2020-04-02: 10000 [IU] via SUBCUTANEOUS
  Filled 2020-04-02: qty 1

## 2020-04-03 ENCOUNTER — Ambulatory Visit: Payer: Medicare Other | Admitting: Cardiovascular Disease

## 2020-04-04 ENCOUNTER — Other Ambulatory Visit: Payer: Self-pay | Admitting: Family

## 2020-04-07 ENCOUNTER — Encounter: Payer: Self-pay | Admitting: Family

## 2020-04-07 ENCOUNTER — Other Ambulatory Visit: Payer: Self-pay | Admitting: Family

## 2020-04-07 DIAGNOSIS — C221 Intrahepatic bile duct carcinoma: Secondary | ICD-10-CM

## 2020-04-09 ENCOUNTER — Ambulatory Visit (HOSPITAL_COMMUNITY)
Admission: RE | Admit: 2020-04-09 | Discharge: 2020-04-09 | Disposition: A | Discharge status: Home / Self Care | Payer: Medicare Other | Source: Ambulatory Visit | Attending: Nephrology | Admitting: Nephrology

## 2020-04-09 ENCOUNTER — Other Ambulatory Visit: Payer: Self-pay

## 2020-04-09 VITALS — BP 150/60 | HR 66 | Resp 20

## 2020-04-09 DIAGNOSIS — N184 Chronic kidney disease, stage 4 (severe): Secondary | ICD-10-CM | POA: Insufficient documentation

## 2020-04-09 DIAGNOSIS — D631 Anemia in chronic kidney disease: Secondary | ICD-10-CM | POA: Diagnosis not present

## 2020-04-09 DIAGNOSIS — D649 Anemia, unspecified: Secondary | ICD-10-CM

## 2020-04-09 LAB — POCT HEMOGLOBIN-HEMACUE: Hemoglobin: 8.7 g/dL — ABNORMAL LOW (ref 13.0–17.0)

## 2020-04-09 MED ORDER — EPOETIN ALFA 10000 UNIT/ML IJ SOLN
INTRAMUSCULAR | Status: AC
  Filled 2020-04-09: qty 1

## 2020-04-09 MED ORDER — EPOETIN ALFA 10000 UNIT/ML IJ SOLN
10000.0000 [IU] | INTRAMUSCULAR | Status: DC
  Administered 2020-04-09: 10000 [IU] via SUBCUTANEOUS

## 2020-04-10 ENCOUNTER — Other Ambulatory Visit (INDEPENDENT_AMBULATORY_CARE_PROVIDER_SITE_OTHER): Payer: Medicare Other

## 2020-04-10 DIAGNOSIS — C221 Intrahepatic bile duct carcinoma: Secondary | ICD-10-CM

## 2020-04-10 LAB — CBC WITH DIFFERENTIAL/PLATELET
Basophils Absolute: 0 10*3/uL (ref 0.0–0.1)
Basophils Relative: 0.8 % (ref 0.0–3.0)
Eosinophils Absolute: 0.3 10*3/uL (ref 0.0–0.7)
Eosinophils Relative: 5.5 % — ABNORMAL HIGH (ref 0.0–5.0)
HCT: 26.6 % — ABNORMAL LOW (ref 39.0–52.0)
Hemoglobin: 8.9 g/dL — ABNORMAL LOW (ref 13.0–17.0)
Lymphocytes Relative: 14.7 % (ref 12.0–46.0)
Lymphs Abs: 0.8 10*3/uL (ref 0.7–4.0)
MCHC: 33.3 g/dL (ref 30.0–36.0)
MCV: 96.9 fl (ref 78.0–100.0)
Monocytes Absolute: 0.7 10*3/uL (ref 0.1–1.0)
Monocytes Relative: 13 % — ABNORMAL HIGH (ref 3.0–12.0)
Neutro Abs: 3.4 10*3/uL (ref 1.4–7.7)
Neutrophils Relative %: 66 % (ref 43.0–77.0)
Platelets: 142 10*3/uL — ABNORMAL LOW (ref 150.0–400.0)
RBC: 2.75 Mil/uL — ABNORMAL LOW (ref 4.22–5.81)
RDW: 17.9 % — ABNORMAL HIGH (ref 11.5–15.5)
WBC: 5.2 10*3/uL (ref 4.0–10.5)

## 2020-04-10 LAB — COMPREHENSIVE METABOLIC PANEL
ALT: 38 U/L (ref 0–53)
AST: 42 U/L — ABNORMAL HIGH (ref 0–37)
Albumin: 3.4 g/dL — ABNORMAL LOW (ref 3.5–5.2)
Alkaline Phosphatase: 261 U/L — ABNORMAL HIGH (ref 39–117)
BUN: 39 mg/dL — ABNORMAL HIGH (ref 6–23)
CO2: 23 mEq/L (ref 19–32)
Calcium: 8.5 mg/dL (ref 8.4–10.5)
Chloride: 98 mEq/L (ref 96–112)
Creatinine, Ser: 3.16 mg/dL — ABNORMAL HIGH (ref 0.40–1.50)
GFR: 18.51 mL/min — ABNORMAL LOW (ref >60.00)
Glucose, Bld: 171 mg/dL — ABNORMAL HIGH (ref 70–99)
Potassium: 4.7 mEq/L (ref 3.5–5.1)
Sodium: 129 mEq/L — ABNORMAL LOW (ref 135–145)
Total Bilirubin: 0.7 mg/dL (ref 0.2–1.2)
Total Protein: 6.9 g/dL (ref 6.0–8.3)

## 2020-04-16 ENCOUNTER — Encounter (HOSPITAL_COMMUNITY)
Admission: RE | Admit: 2020-04-16 | Discharge: 2020-04-16 | Disposition: A | Discharge status: Home / Self Care | Payer: Medicare Other | Source: Ambulatory Visit | Attending: Nephrology | Admitting: Nephrology

## 2020-04-16 ENCOUNTER — Other Ambulatory Visit: Payer: Self-pay

## 2020-04-16 VITALS — BP 154/63 | HR 62 | Temp 95.9°F | Resp 20

## 2020-04-16 DIAGNOSIS — D649 Anemia, unspecified: Secondary | ICD-10-CM

## 2020-04-16 LAB — POCT HEMOGLOBIN-HEMACUE: Hemoglobin: 8.4 g/dL — ABNORMAL LOW (ref 13.0–17.0)

## 2020-04-16 MED ORDER — EPOETIN ALFA 10000 UNIT/ML IJ SOLN
INTRAMUSCULAR | Status: AC
  Filled 2020-04-16: qty 1

## 2020-04-16 MED ORDER — EPOETIN ALFA 10000 UNIT/ML IJ SOLN
10000.0000 [IU] | INTRAMUSCULAR | Status: DC
  Administered 2020-04-16: 10000 [IU] via SUBCUTANEOUS

## 2020-04-20 ENCOUNTER — Other Ambulatory Visit: Payer: Self-pay

## 2020-04-20 ENCOUNTER — Encounter: Payer: Self-pay | Admitting: Family

## 2020-04-20 ENCOUNTER — Encounter: Payer: Self-pay | Admitting: Cardiovascular Disease

## 2020-04-20 ENCOUNTER — Ambulatory Visit: Payer: Medicare Other | Admitting: Cardiovascular Disease

## 2020-04-20 VITALS — BP 140/62 | HR 66 | Ht 67.0 in | Wt 139.8 lb

## 2020-04-20 DIAGNOSIS — I480 Paroxysmal atrial fibrillation: Secondary | ICD-10-CM

## 2020-04-20 DIAGNOSIS — I493 Ventricular premature depolarization: Secondary | ICD-10-CM

## 2020-04-20 NOTE — Progress Notes (Signed)
CARDIOLOGY OFFICE NOTE  Date:  04/20/2020    Gerald Jacobs Date of Birth: 10-Jun-1925 Medical Record #[id number]  PCP:  Marrian Salvage, FNP  Cardiologist:    Genice Rouge   Chief Complaint  Patient presents with  . Atrial Fibrillation  . Hypertension   Problem List 1. Essential HTN 2. DM 3. CKD - sees [name],   baseline Cr = 1.94 4, Anemia - taking procrit  5. 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  6. PVCs    Notes from Truitt Merle:   Gerald Jacobs is a 86 y.o. male who presents today for a 4 month check. Former patient of Dr. Sherryl Barters.   He has a history of HTN, palpitations, dyslipidemia, diabetes, and mild renal insufficiency. Also has a history of hyperuricemia. He has anemia secondary to chronic renal disease and receives ejections of Procrit about every 3 weeks depending on his hemoglobin level. Dr. Posey Pronto is his nephrologist.   He was last seen back in November and felt to be doing well. Had his Medicare well check last month by PCP.   Comes back today. Here alone. Wife was to be seen also today - but having shoulder issues due to a fall and has cancelled. He is doing ok. No chest pain. He is trying to stay active but notes he has slowed down some. Has stable DOE. No falls. No passing out. Occasional palpitations but nothing that really worries him or produces symptoms. He is pleased with how he is doing. He has a brother who is alive at 106.   Oct. 6, 2017: This is the first time meeting Gerald Jacobs . He is a transfer from [place]. He has a history of hypertension and diabetes Is doing well. Has some shoulder issues Also goes to the [place] [facility]   Plays golf on Thursdays - limited by his shoulder pain  No longer does his yard work  No CP ,  Occasional dyspnea - also has some anemia  BP is a bit high today . Takes his BP at home and the reading are typically quite good.  Retired from [employer] - retired in  1989.    March 15, 2017: Gerald Jacobs is seen back today for follow up of his HTN. BP has been a little high  Not playing much golf recently .   Nov. 6, 2018:  Having some palpitations.   Has had these in the past.   Metoprolol was increased which solved the issue  Also has bile duct cancer - is under some additional stress due to this  Has lost 10-15 labs over the past 6 months .   Unable to eat much  No energy.   Has had anemia.  Unable to do any yard work now  May 10, 2018:  Was hospitalized at [facility] recently    For complications of bile duct cancer .   Has had previous stenting of his bile duct.   Has had them replaced several times.  Cancer has progressed slightly .    Feb. 17, 2020: Has been back in the hospital at [facility].  These was some discussion about stopping ASA  Has issues with anemia. Was found to have atrial fib at his last Appt at [facility] oncology.   Was recommended to see Dr. Rayann Heman.   He is completely asymptomatic from an atrial fib standpoint.   He cannot tell that his HR is  irreg.  Has chronic anemia but denies any bleeding in his stool or urine.     October 04, 2019:  Gerald Jacobs is seen back for follow-up visit regarding his atrial fibrillation.  He now is on Eliquis 2.5 mg twice a day. Doing well from a cardiac standpoint  Has biliary cancer and has some nausea from this  Able to eat and drink fairly well.    Apr 20, 2020: Gerald Jacobs is seen back today for for follow-up of his paroxysmal atrial fibrillation.  He is now on Eliquis 2.5 mg twice a day. He has a history of biliary cancer.  He has chronic kidney disease.  Recent creatinine is 3.16. Had a kyphoplasty recently .  Has lots of nausea .  BP is mildly elevated today     Past Medical History:  Diagnosis Date  . Bilateral shoulder pain 07/28/2016  . Cancer [facility])    prostate  . Chest pain 10/27/2016  . Chronic anemia    With normal iron studies and normal serum protein electrophoresis  . Coronary  artery disease    Mild  . Cough 10/27/2016  . Diabetes mellitus    Adult onset  . Essential hypertension   . GERD (gastroesophageal reflux disease)    occ tums  . History of gout   . Hyperbilirubinemia 06/15/2017  . Hyponatremia 06/15/2017  . Intermittent palpitations 07/23/2014  . Liver mass   . Mass of bile duct 06/27/2017  . Mild renal insufficiency   . Nocturia    x2  . Normocytic anemia 06/23/2016  . Osteoarthritis   . Sciatica of right side 07/28/2016  . Statin myopathy 07/28/2016  . Weight loss 06/15/2017    Past Surgical History:  Procedure Laterality Date  . CHOLECYSTECTOMY  02/04/2014   DR Zella Richer  . CHOLECYSTECTOMY N/A 02/04/2014   Procedure: LAPAROSCOPIC CHOLECYSTECTOMY with intraoperative cholangiogram;  Surgeon: Odis Hollingshead, MD;  Location: [place];  Service: General;  Laterality: N/A;  . ERCP N/A 10/15/2013   Procedure: ENDOSCOPIC RETROGRADE CHOLANGIOPANCREATOGRAPHY (ERCP);  Surgeon: Jeryl Columbia, MD;  Location: [facility];  Service: Endoscopy;  Laterality: N/A;  . KNEE SURGERY Right 1979  . melanoma surgery  1970's   on scalp  . PROSTATE SURGERY  1993   Prostate Cancer  . SPHINCTEROTOMY  10/15/2013   Procedure: SPHINCTEROTOMY;  Surgeon: Jeryl Columbia, MD;  Location: [facility];  Service: Endoscopy;;     Medications: Current Outpatient Medications  Medication Sig Dispense Refill  . allopurinol (ZYLOPRIM) 100 MG tablet TAKE 1 TABLET BY MOUTH EVERY DAY 90 tablet 3  . amLODipine (NORVASC) 2.5 MG tablet Take 2.5 mg by mouth daily.    . cholecalciferol (VITAMIN D) 1000 units tablet Take 1,000 Units by mouth 2 (two) times daily.     Marland Kitchen ELIQUIS 2.5 MG TABS tablet TAKE 1 TABLET BY MOUTH TWICE A DAY 180 tablet 1  . epoetin alfa (EPOGEN,PROCRIT) 25427 UNIT/ML injection Inject 10,000 Units into the vein once a week.    . furosemide (LASIX) 20 MG tablet Take 20 mg by mouth as needed.    Marland Kitchen glucose blood (ONE TOUCH ULTRA TEST) test strip USE ONE STRIP PER TEST.  CHECK BLOOD SUGARS 1-4 TIMES PER DAY AS INSTRUCTED. 100 each 4  . JANUVIA 50 MG tablet TAKE 1 TABLET BY MOUTH EVERY DAY 90 tablet 1  . metoprolol succinate (TOPROL-XL) 50 MG 24 hr tablet TAKE 1 TABLET (50 MG TOTAL) BY MOUTH 2 (TWO) TIMES DAILY. TAKE WITH OR  IMMEDIATELY FOLLOWING A MEAL. 180 tablet 3  . mupirocin ointment (BACTROBAN) 2 % Place 1 application into the nose once a week.    . Omega-3 Fatty Acids (FISH OIL) 1000 MG CAPS Take 1,000 mg by mouth at bedtime.     . ondansetron (ZOFRAN) 8 MG tablet Take by mouth every 8 (eight) hours as needed for nausea or vomiting.    Glory Rosebush Delica Lancets 25K MISC TEST BLOOD SUGARS 1-4 TIMES A DAY AS INSTRUCTED. DX CODE: E11.9 100 each 3  . pantoprazole (PROTONIX) 40 MG tablet Take 40 mg by mouth daily as needed (indigestion).     . promethazine (PHENERGAN) 12.5 MG tablet Take 12.5 mg by mouth every 6 (six) hours as needed for nausea or vomiting.    . protein supplement shake (PREMIER PROTEIN) LIQD Take 2 oz by mouth daily as needed.     . triamcinolone cream (KENALOG) 0.1 % Apply 1 application topically as needed.     No current facility-administered medications for this visit.    Allergies: Allergies  Allergen Reactions  . Amoxicillin-Pot Clavulanate Diarrhea    Diarrhea  Patient reports contracted C.diff while taking this  . Colchicine Other (See Comments)    GI problems  . Flexeril [Cyclobenzaprine Hcl] Other (See Comments)    GI problems   . Levaquin [Levofloxacin In D5w] Other (See Comments)    Insomnia   . Percocet [Oxycodone-Acetaminophen] Nausea And Vomiting  . Zebeta Other (See Comments)    Gi problems  . Bisoprolol Fumarate Other (See Comments)    Gi problems    Social History: The patient  reports that he quit smoking about 40 years ago. His smoking use included cigarettes. He has a 45.00 pack-year smoking history. He has never used smokeless tobacco. He reports current alcohol use. He reports that he does not use  drugs.   Family History: The patient's family history includes Stroke in his brother, brother, brother, father, mother, and sister.   Review of Systems: Please see the history of present illness.   Otherwise, the review of systems is positive for none.   All other systems are reviewed and negative.   Physical Exam: Blood pressure 140/62, pulse 66, height 5\' 7"  (1.702 m), weight 139 lb 12 oz (63.4 kg), SpO2 (!) 66 %.  GEN:   Elderly male,  Slightly frail.  NAD  HEENT: Normal NECK: No JVD; No carotid bruits LYMPHATICS: No lymphadenopathy CARDIAC: RRR , no murmurs, rubs, gallops RESPIRATORY:  Clear to auscultation without rales, wheezing or rhonchi  ABDOMEN: Soft, non-tender, non-distended MUSCULOSKELETAL:  No edema; No deformity  SKIN:  Yellow appearance   NEUROLOGIC:  Alert and oriented x 3    LABORATORY DATA:  EKG: Apr 20, 2020: Normal sinus rhythm at 67.  Nonspecific ST and T wave abnormalities.   Lab Results  Component Value Date   WBC 5.2 04/10/2020   HGB 8.4 (L) 04/16/2020   HCT 26.6 (L) 04/10/2020   PLT 142.0 (L) 04/10/2020   GLUCOSE 171 (H) 04/10/2020   CHOL 195 02/08/2017   TRIG 154.0 (H) 02/08/2017   HDL 74.00 02/08/2017   LDLCALC 90 02/08/2017   ALT 38 04/10/2020   AST 42 (H) 04/10/2020   NA 129 (L) 04/10/2020   K 4.7 04/10/2020   CL 98 04/10/2020   CREATININE 3.16 (H) 04/10/2020   BUN 39 (H) 04/10/2020   CO2 23 04/10/2020   TSH 1.990 01/21/2020   PSA 0.01 (L) 02/08/2017   INR 1.06 06/19/2017  HGBA1C 5.8 12/03/2018   MICROALBUR 92.6 (H) 05/21/2018    BNP (last 3 results) No results for input(s): BNP in the last 8760 hours.  ProBNP (last 3 results) No results for input(s): PROBNP in the last 8760 hours.   Other Studies Rviewed Today:   Assessment/Plan: 1. atrail fib:   Maintaining sinus rhythm.  Continue Eliquis at low-dose.  2. PVCS:     -He is not having any significant episodes of PVCs.  We will continue to follow.  3. CKD -    creatinine recently was 3.16.  This is followed by his primary medical doctor.  4. DM -     5. History of gout -     Current medicines are reviewed with the patient today.  The patient does not have concerns regarding medicines other than what has been noted above.  The following changes have been made:  See above.  Labs/ tests ordered today include:    No orders of the defined types were placed in this encounter.   Mertie Moores, MD  04/20/2020 10:11 AM    [facility] [place],  [place], [place]  [address] Pager [phone] Phone: [phone]; Fax: [phone]

## 2020-04-20 NOTE — Patient Instructions (Signed)
Medication Instructions:   Your physician recommends that you continue on your current medications as directed. Please refer to the Current Medication list given to you today.  *If you need a refill on your cardiac medications before your next appointment, please call your pharmacy*   Follow-Up: At [facility], you and your health needs are our priority.  As part of our continuing mission to provide you with exceptional heart care, we have created designated Provider Care Teams.  These Care Teams include your primary Cardiologist (physician) and Advanced Practice Providers (APPs -  Physician Assistants and Nurse Practitioners) who all work together to provide you with the care you need, when you need it.  We recommend signing up for the patient portal called "MyChart".  Sign up information is provided on this After Visit Summary.  MyChart is used to connect with patients for Virtual Visits (Telemedicine).  Patients are able to view lab/test results, encounter notes, upcoming appointments, etc.  Non-urgent messages can be sent to your provider as well.   To learn more about what you can do with MyChart, go to [email or web address].    Your next appointment:   6 month(s)  The format for your next appointment:   In Person  Provider:   Mertie Moores, MD

## 2020-04-23 ENCOUNTER — Encounter (HOSPITAL_COMMUNITY)
Admission: RE | Admit: 2020-04-23 | Discharge: 2020-04-23 | Disposition: A | Discharge status: Home / Self Care | Payer: Medicare Other | Source: Ambulatory Visit | Attending: Nephrology | Admitting: Nephrology

## 2020-04-23 ENCOUNTER — Other Ambulatory Visit: Payer: Self-pay

## 2020-04-23 VITALS — BP 171/62 | HR 65 | Resp 20

## 2020-04-23 DIAGNOSIS — D649 Anemia, unspecified: Secondary | ICD-10-CM

## 2020-04-23 LAB — POCT HEMOGLOBIN-HEMACUE: Hemoglobin: 8.5 g/dL — ABNORMAL LOW (ref 13.0–17.0)

## 2020-04-23 LAB — IRON AND TIBC
Iron: 40 ug/dL — ABNORMAL LOW (ref 45–182)
Saturation Ratios: 14 % — ABNORMAL LOW (ref 17.9–39.5)
TIBC: 277 ug/dL (ref 250–450)
UIBC: 237 ug/dL

## 2020-04-23 LAB — FERRITIN: Ferritin: 179 ng/mL (ref 24–336)

## 2020-04-23 MED ORDER — EPOETIN ALFA 10000 UNIT/ML IJ SOLN: 10000.0000 [IU] | INTRAMUSCULAR | Status: DC

## 2020-04-23 MED ORDER — EPOETIN ALFA 10000 UNIT/ML IJ SOLN
INTRAMUSCULAR | Status: AC
  Administered 2020-04-23: 10000 [IU]
  Filled 2020-04-23: qty 1

## 2020-04-27 ENCOUNTER — Encounter: Payer: Self-pay | Admitting: Family

## 2020-04-28 ENCOUNTER — Ambulatory Visit: Payer: Medicare Other | Admitting: Family

## 2020-04-28 ENCOUNTER — Other Ambulatory Visit: Payer: Self-pay | Admitting: Family

## 2020-04-28 ENCOUNTER — Encounter: Payer: Self-pay | Admitting: Family

## 2020-04-28 ENCOUNTER — Other Ambulatory Visit: Payer: Self-pay

## 2020-04-28 ENCOUNTER — Other Ambulatory Visit (INDEPENDENT_AMBULATORY_CARE_PROVIDER_SITE_OTHER): Payer: Medicare Other

## 2020-04-28 VITALS — BP 144/60 | HR 61 | Temp 98.1°F | Ht 67.0 in | Wt 136.8 lb

## 2020-04-28 DIAGNOSIS — C221 Intrahepatic bile duct carcinoma: Secondary | ICD-10-CM

## 2020-04-28 DIAGNOSIS — R195 Other fecal abnormalities: Secondary | ICD-10-CM

## 2020-04-28 DIAGNOSIS — D500 Iron deficiency anemia secondary to blood loss (chronic): Secondary | ICD-10-CM

## 2020-04-28 LAB — COMPREHENSIVE METABOLIC PANEL
ALT: 37 U/L (ref 0–53)
AST: 39 U/L — ABNORMAL HIGH (ref 0–37)
Albumin: 3.3 g/dL — ABNORMAL LOW (ref 3.5–5.2)
Alkaline Phosphatase: 277 U/L — ABNORMAL HIGH (ref 39–117)
BUN: 55 mg/dL — ABNORMAL HIGH (ref 6–23)
CO2: 20 mEq/L (ref 19–32)
Calcium: 8.5 mg/dL (ref 8.4–10.5)
Chloride: 95 mEq/L — ABNORMAL LOW (ref 96–112)
Creatinine, Ser: 3.13 mg/dL — ABNORMAL HIGH (ref 0.40–1.50)
GFR: 18.71 mL/min — ABNORMAL LOW (ref >60.00)
Glucose, Bld: 219 mg/dL — ABNORMAL HIGH (ref 70–99)
Potassium: 5.3 mEq/L — ABNORMAL HIGH (ref 3.5–5.1)
Sodium: 122 mEq/L — ABNORMAL LOW (ref 135–145)
Total Bilirubin: 0.6 mg/dL (ref 0.2–1.2)
Total Protein: 6.6 g/dL (ref 6.0–8.3)

## 2020-04-28 LAB — IRON,TIBC AND FERRITIN PANEL
%SAT: 17 % (calc) — ABNORMAL LOW (ref 20–48)
Ferritin: 117 ng/mL (ref 24–380)
Iron: 42 ug/dL — ABNORMAL LOW (ref 50–180)
TIBC: 244 mcg/dL (calc) — ABNORMAL LOW (ref 250–425)

## 2020-04-28 LAB — CBC WITH DIFFERENTIAL/PLATELET
Basophils Absolute: 0.1 10*3/uL (ref 0.0–0.1)
Basophils Relative: 0.9 % (ref 0.0–3.0)
Eosinophils Absolute: 0.3 10*3/uL (ref 0.0–0.7)
Eosinophils Relative: 3.5 % (ref 0.0–5.0)
HCT: 21.8 % — CL (ref 39.0–52.0)
Hemoglobin: 7.2 g/dL — CL (ref 13.0–17.0)
Lymphocytes Relative: 13.5 % (ref 12.0–46.0)
Lymphs Abs: 1.1 10*3/uL (ref 0.7–4.0)
MCHC: 32.9 g/dL (ref 30.0–36.0)
MCV: 97.5 fl (ref 78.0–100.0)
Monocytes Absolute: 0.7 10*3/uL (ref 0.1–1.0)
Monocytes Relative: 8.8 % (ref 3.0–12.0)
Neutro Abs: 5.8 10*3/uL (ref 1.4–7.7)
Neutrophils Relative %: 73.3 % (ref 43.0–77.0)
Platelets: 197 10*3/uL (ref 150.0–400.0)
RBC: 2.23 Mil/uL — ABNORMAL LOW (ref 4.22–5.81)
RDW: 17.4 % — ABNORMAL HIGH (ref 11.5–15.5)
WBC: 8 10*3/uL (ref 4.0–10.5)

## 2020-04-28 NOTE — Progress Notes (Signed)
Reviewed with family and patient; he will go to [facility] for evaluation and treatment; understands that needs blood transfusion and is comfortable with this plan.

## 2020-04-28 NOTE — Progress Notes (Signed)
Gerald Jacobs is a 86 y.o. male with the following history as recorded in EpicCare:  Patient Active Problem List   Diagnosis Date Noted  . Acute left-sided low back pain without sciatica 03/05/2020  . Closed wedge compression fracture of second lumbar vertebra ([place]) 03/05/2020  . Atrial fibrillation ([place]) 10/04/2019  . 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 Essential hypertension 09/02/2016  . Statin myopathy 07/28/2016  . Normocytic anemia 06/23/2016  . Medicare annual wellness visit, subsequent 01/18/2016  . Routine general medical examination at a health care facility 01/18/2016  . Frequent PVCs 11/24/2014  . Chronic cholecystitis with calculus 02/04/2014  . Choledocholithiasis 08/28/2013  . Symptomatic cholelithiasis 06/04/2013  . Benign hypertensive heart disease without heart failure 09/07/2011  . Type 2 diabetes mellitus with stage 4 chronic kidney disease, without long-term current use of insulin ([place]) 09/07/2011  . Dyslipidemia 09/07/2011  . CKD (chronic kidney disease), stage IV ([place]) 09/07/2011    Current Outpatient Medications  Medication Sig Dispense Refill  . allopurinol (ZYLOPRIM) 100 MG tablet TAKE 1 TABLET BY MOUTH EVERY DAY 90 tablet 3  . amLODipine (NORVASC) 2.5 MG tablet Take 2.5 mg by mouth daily.    . cholecalciferol (VITAMIN D) 1000 units tablet Take 1,000 Units by mouth 2 (two) times daily.     Marland Kitchen ELIQUIS 2.5 MG TABS tablet TAKE 1 TABLET BY MOUTH TWICE A DAY 180 tablet 1  . epoetin alfa (EPOGEN,PROCRIT) 57262 UNIT/ML injection Inject 10,000 Units into the vein once a week.    . furosemide (LASIX) 20 MG tablet Take 20 mg by mouth as needed.    Marland Kitchen glucose blood (ONE TOUCH ULTRA TEST) test strip USE ONE STRIP PER TEST. CHECK BLOOD SUGARS 1-4 TIMES PER DAY AS INSTRUCTED. 100 each 4  . JANUVIA 50 MG tablet TAKE 1 TABLET BY MOUTH EVERY DAY 90 tablet 1  . metoprolol succinate (TOPROL-XL) 50 MG 24 hr tablet TAKE 1 TABLET (50 MG TOTAL) BY MOUTH 2 (TWO)  TIMES DAILY. TAKE WITH OR IMMEDIATELY FOLLOWING A MEAL. 180 tablet 3  . mupirocin ointment (BACTROBAN) 2 % Place 1 application into the nose once a week.    . Omega-3 Fatty Acids (FISH OIL) 1000 MG CAPS Take 1,000 mg by mouth at bedtime.     . ondansetron (ZOFRAN) 8 MG tablet Take by mouth every 8 (eight) hours as needed for nausea or vomiting.    Glory Rosebush Delica Lancets 03T MISC TEST BLOOD SUGARS 1-4 TIMES A DAY AS INSTRUCTED. DX CODE: E11.9 100 each 3  . promethazine (PHENERGAN) 12.5 MG tablet Take 12.5 mg by mouth every 6 (six) hours as needed for nausea or vomiting.    . protein supplement shake (PREMIER PROTEIN) LIQD Take 2 oz by mouth daily as needed.     . triamcinolone cream (KENALOG) 0.1 % Apply 1 application topically as needed.    . pantoprazole (PROTONIX) 40 MG tablet Take 40 mg by mouth daily as needed (indigestion).      No current facility-administered medications for this visit.    Allergies: Amoxicillin-pot clavulanate, Colchicine, Flexeril [cyclobenzaprine hcl], Levaquin [levofloxacin in d5w], Percocet [oxycodone-acetaminophen], Zebeta, and Bisoprolol fumarate  Past Medical History:  Diagnosis Date  . Bilateral shoulder pain 07/28/2016  . Cancer [facility])    prostate  . Chest pain 10/27/2016  . Chronic anemia    With normal iron studies and normal serum protein electrophoresis  . Coronary artery disease    Mild  . Cough 10/27/2016  . Diabetes mellitus  Adult onset  . Essential hypertension   . GERD (gastroesophageal reflux disease)    occ tums  . History of gout   . Hyperbilirubinemia 06/15/2017  . Hyponatremia 06/15/2017  . Intermittent palpitations 07/23/2014  . Liver mass   . Mass of bile duct 06/27/2017  . Mild renal insufficiency   . Nocturia    x2  . Normocytic anemia 06/23/2016  . Osteoarthritis   . Sciatica of right side 07/28/2016  . Statin myopathy 07/28/2016  . Weight loss 06/15/2017    Past Surgical History:  Procedure Laterality Date  .  CHOLECYSTECTOMY  02/04/2014   DR Zella Richer  . CHOLECYSTECTOMY N/A 02/04/2014   Procedure: LAPAROSCOPIC CHOLECYSTECTOMY with intraoperative cholangiogram;  Surgeon: Odis Hollingshead, MD;  Location: [place];  Service: General;  Laterality: N/A;  . ERCP N/A 10/15/2013   Procedure: ENDOSCOPIC RETROGRADE CHOLANGIOPANCREATOGRAPHY (ERCP);  Surgeon: Jeryl Columbia, MD;  [facility];  Service: Endoscopy;  Laterality: N/A;  . KNEE SURGERY Right 1979  . melanoma surgery  1970's   on scalp  . PROSTATE SURGERY  1993   Prostate Cancer  . SPHINCTEROTOMY  10/15/2013   Procedure: SPHINCTEROTOMY;  Surgeon: Jeryl Columbia, MD;  [facility];  Service: Endoscopy;;    Family History  Problem Relation Age of Onset  . Stroke Father   . Stroke Mother   . Stroke Sister   . Stroke Brother   . Stroke Brother   . Stroke Brother     Social History   Tobacco Use  . Smoking status: Former Smoker    Packs/day: 1.00    Years: 45.00    Pack years: 45.00    Types: Cigarettes    Quit date: 09/07/1979    Years since quitting: 40.6  . Smokeless tobacco: Never Used  Substance Use Topics  . Alcohol use: Yes    Comment: beer rarely    Subjective:  Complaining of dark stools x 4-5 days; notes that appetite has been down for a while; known history of renal disease and intrahepatic cholangiocarcinoma;  Family has already contacted [place] this am to discuss updating labs, considering a trial of Protonix and urgent GI referral; labs were drawn last week which showed Hgb at 8.5 ( takes weekly Procrit from nephrology);   Objective:  Vitals:   04/28/20 1134  BP: (!) 144/60  Pulse: 61  Temp: 98.1 F (36.7 C)  TempSrc: Oral  SpO2: 99%  Weight: 136 lb 12.8 oz (62.1 kg)  Height: 5\' 7"  (1.702 m)    General: Well developed, well nourished, in no acute distress  Skin : Warm and dry.  Head: Normocephalic and atraumatic  Lungs: Respirations unlabored; clear to auscultation bilaterally without wheeze,  rales, rhonchi  Neurologic: Alert and oriented; speech intact; face symmetrical; moves all extremities well; CNII-XII intact without focal deficit   Assessment:  1. Dark stools   2. Anemia due to GI blood loss     Plan:  STAT hemoglobin today is at 7.2; patient is aware that he will need to have a blood transfusion/ admission. He and his family prefer that he go to ER [facility] where his oncologist is located; they agree to take him there this afternoon. Follow up to be determined.  This visit occurred during the SARS-CoV-2 public health emergency.  Safety protocols were in place, including screening questions prior to the visit, additional usage of staff PPE, and extensive cleaning of exam room while observing appropriate contact time as indicated for disinfecting  solutions.     No follow-ups on file.  No orders of the defined types were placed in this encounter.   Requested Prescriptions    No prescriptions requested or ordered in this encounter

## 2020-04-29 MED ORDER — CARBOXYMETHYLCELLULOSE SODIUM 0.5 % OP SOLN
1.00 | OPHTHALMIC | Status: DC
Start: ? — End: 2020-04-29

## 2020-04-29 MED ORDER — HYDROCORTISONE 1 % EX CREA
TOPICAL_CREAM | CUTANEOUS | Status: DC
Start: ? — End: 2020-04-29

## 2020-04-29 MED ORDER — ALLOPURINOL 100 MG PO TABS
100.00 | ORAL_TABLET | ORAL | Status: DC
Start: 2020-05-01 — End: 2020-04-29

## 2020-04-29 MED ORDER — ONDANSETRON HCL 8 MG PO TABS
8.00 | ORAL_TABLET | ORAL | Status: DC
Start: ? — End: 2020-04-29

## 2020-04-29 MED ORDER — SODIUM CHLORIDE 0.9 % IV SOLN
INTRAVENOUS | Status: DC
Start: ? — End: 2020-04-29

## 2020-04-29 MED ORDER — ACETAMINOPHEN 325 MG PO TABS
650.00 | ORAL_TABLET | ORAL | Status: DC
Start: ? — End: 2020-04-29

## 2020-04-29 MED ORDER — AMLODIPINE BESYLATE 5 MG PO TABS
2.50 | ORAL_TABLET | ORAL | Status: DC
Start: 2020-05-01 — End: 2020-04-29

## 2020-04-29 MED ORDER — GENERIC EXTERNAL MEDICATION
8.00 | Status: DC
Start: ? — End: 2020-04-29

## 2020-04-29 MED ORDER — ONDANSETRON HCL 4 MG/2ML IJ SOLN
4.00 | INTRAMUSCULAR | Status: DC
Start: ? — End: 2020-04-29

## 2020-04-29 MED ORDER — METOPROLOL SUCCINATE ER 50 MG PO TB24
50.00 | ORAL_TABLET | ORAL | Status: DC
Start: 2020-04-30 — End: 2020-04-29

## 2020-04-30 ENCOUNTER — Inpatient Hospital Stay (HOSPITAL_COMMUNITY): Admission: RE | Admit: 2020-04-30 | Payer: Medicare Other | Source: Ambulatory Visit

## 2020-04-30 MED ORDER — LIDOCAINE HCL 1 % IJ SOLN
0.50 | INTRAMUSCULAR | Status: DC
Start: ? — End: 2020-04-30

## 2020-04-30 MED ORDER — GENERIC EXTERNAL MEDICATION
40.00 | Status: DC
Start: 2020-04-30 — End: 2020-04-30

## 2020-05-01 ENCOUNTER — Encounter: Payer: Self-pay | Admitting: Family

## 2020-05-04 ENCOUNTER — Other Ambulatory Visit: Payer: Self-pay | Admitting: Family

## 2020-05-04 ENCOUNTER — Encounter: Payer: Self-pay | Admitting: Family

## 2020-05-05 ENCOUNTER — Other Ambulatory Visit: Payer: Self-pay | Admitting: Family

## 2020-05-05 DIAGNOSIS — C221 Intrahepatic bile duct carcinoma: Secondary | ICD-10-CM

## 2020-05-07 ENCOUNTER — Other Ambulatory Visit: Payer: Self-pay

## 2020-05-07 ENCOUNTER — Other Ambulatory Visit (INDEPENDENT_AMBULATORY_CARE_PROVIDER_SITE_OTHER): Payer: Medicare Other

## 2020-05-07 ENCOUNTER — Encounter (HOSPITAL_COMMUNITY)
Admission: RE | Admit: 2020-05-07 | Discharge: 2020-05-07 | Disposition: A | Discharge status: Home / Self Care | Payer: Medicare Other | Source: Ambulatory Visit | Attending: Nephrology | Admitting: Nephrology

## 2020-05-07 ENCOUNTER — Encounter (HOSPITAL_COMMUNITY): Payer: Medicare Other

## 2020-05-07 VITALS — BP 155/61 | HR 72 | Resp 20

## 2020-05-07 DIAGNOSIS — N184 Chronic kidney disease, stage 4 (severe): Secondary | ICD-10-CM | POA: Diagnosis not present

## 2020-05-07 DIAGNOSIS — D649 Anemia, unspecified: Secondary | ICD-10-CM

## 2020-05-07 DIAGNOSIS — D631 Anemia in chronic kidney disease: Secondary | ICD-10-CM | POA: Diagnosis not present

## 2020-05-07 DIAGNOSIS — C221 Intrahepatic bile duct carcinoma: Secondary | ICD-10-CM

## 2020-05-07 LAB — COMPREHENSIVE METABOLIC PANEL
ALT: 24 U/L (ref 0–53)
AST: 32 U/L (ref 0–37)
Albumin: 3.5 g/dL (ref 3.5–5.2)
Alkaline Phosphatase: 296 U/L — ABNORMAL HIGH (ref 39–117)
BUN: 42 mg/dL — ABNORMAL HIGH (ref 6–23)
CO2: 20 mEq/L (ref 19–32)
Calcium: 8.9 mg/dL (ref 8.4–10.5)
Chloride: 98 mEq/L (ref 96–112)
Creatinine, Ser: 3.06 mg/dL — ABNORMAL HIGH (ref 0.40–1.50)
GFR: 19.2 mL/min — ABNORMAL LOW (ref >60.00)
Glucose, Bld: 137 mg/dL — ABNORMAL HIGH (ref 70–99)
Potassium: 4.9 mEq/L (ref 3.5–5.1)
Sodium: 127 mEq/L — ABNORMAL LOW (ref 135–145)
Total Bilirubin: 0.6 mg/dL (ref 0.2–1.2)
Total Protein: 7.1 g/dL (ref 6.0–8.3)

## 2020-05-07 LAB — POCT HEMOGLOBIN-HEMACUE: Hemoglobin: 9.5 g/dL — ABNORMAL LOW (ref 13.0–17.0)

## 2020-05-07 LAB — CBC WITH DIFFERENTIAL/PLATELET
Basophils Absolute: 0 10*3/uL (ref 0.0–0.1)
Basophils Relative: 0.7 % (ref 0.0–3.0)
Eosinophils Absolute: 0.5 10*3/uL (ref 0.0–0.7)
Eosinophils Relative: 6.8 % — ABNORMAL HIGH (ref 0.0–5.0)
HCT: 29.4 % — ABNORMAL LOW (ref 39.0–52.0)
Hemoglobin: 9.7 g/dL — ABNORMAL LOW (ref 13.0–17.0)
Lymphocytes Relative: 13.2 % (ref 12.0–46.0)
Lymphs Abs: 0.9 10*3/uL (ref 0.7–4.0)
MCHC: 33.1 g/dL (ref 30.0–36.0)
MCV: 96 fl (ref 78.0–100.0)
Monocytes Absolute: 0.8 10*3/uL (ref 0.1–1.0)
Monocytes Relative: 11.3 % (ref 3.0–12.0)
Neutro Abs: 4.7 10*3/uL (ref 1.4–7.7)
Neutrophils Relative %: 68 % (ref 43.0–77.0)
Platelets: 177 10*3/uL (ref 150.0–400.0)
RBC: 3.06 Mil/uL — ABNORMAL LOW (ref 4.22–5.81)
RDW: 15.8 % — ABNORMAL HIGH (ref 11.5–15.5)
WBC: 6.9 10*3/uL (ref 4.0–10.5)

## 2020-05-07 MED ORDER — EPOETIN ALFA 10000 UNIT/ML IJ SOLN
10000.0000 [IU] | INTRAMUSCULAR | Status: DC
  Administered 2020-05-07: 10000 [IU] via SUBCUTANEOUS

## 2020-05-07 MED ORDER — EPOETIN ALFA 10000 UNIT/ML IJ SOLN
INTRAMUSCULAR | Status: AC
  Filled 2020-05-07: qty 1

## 2020-05-14 ENCOUNTER — Encounter (HOSPITAL_COMMUNITY): Payer: Medicare Other

## 2020-05-18 ENCOUNTER — Encounter: Payer: Self-pay | Admitting: Family

## 2020-05-19 ENCOUNTER — Other Ambulatory Visit (INDEPENDENT_AMBULATORY_CARE_PROVIDER_SITE_OTHER): Payer: Medicare Other

## 2020-05-19 ENCOUNTER — Other Ambulatory Visit: Payer: Self-pay | Admitting: Family

## 2020-05-19 ENCOUNTER — Other Ambulatory Visit: Payer: Self-pay

## 2020-05-19 ENCOUNTER — Ambulatory Visit (HOSPITAL_COMMUNITY)
Admission: RE | Admit: 2020-05-19 | Discharge: 2020-05-19 | Disposition: A | Discharge status: Home / Self Care | Payer: Medicare Other | Source: Ambulatory Visit | Attending: Nephrology | Admitting: Nephrology

## 2020-05-19 VITALS — BP 154/54 | HR 64 | Temp 97.3°F | Resp 20

## 2020-05-19 DIAGNOSIS — D631 Anemia in chronic kidney disease: Secondary | ICD-10-CM | POA: Insufficient documentation

## 2020-05-19 DIAGNOSIS — N184 Chronic kidney disease, stage 4 (severe): Secondary | ICD-10-CM | POA: Insufficient documentation

## 2020-05-19 DIAGNOSIS — D649 Anemia, unspecified: Secondary | ICD-10-CM

## 2020-05-19 DIAGNOSIS — C221 Intrahepatic bile duct carcinoma: Secondary | ICD-10-CM

## 2020-05-19 LAB — CBC WITH DIFFERENTIAL/PLATELET
Basophils Absolute: 0.1 10*3/uL (ref 0.0–0.1)
Basophils Relative: 1 % (ref 0.0–3.0)
Eosinophils Absolute: 0.4 10*3/uL (ref 0.0–0.7)
Eosinophils Relative: 7 % — ABNORMAL HIGH (ref 0.0–5.0)
HCT: 27 % — ABNORMAL LOW (ref 39.0–52.0)
Hemoglobin: 9.1 g/dL — ABNORMAL LOW (ref 13.0–17.0)
Lymphocytes Relative: 16 % (ref 12.0–46.0)
Lymphs Abs: 0.9 10*3/uL (ref 0.7–4.0)
MCHC: 33.5 g/dL (ref 30.0–36.0)
MCV: 95.4 fl (ref 78.0–100.0)
Monocytes Absolute: 0.8 10*3/uL (ref 0.1–1.0)
Monocytes Relative: 13.5 % — ABNORMAL HIGH (ref 3.0–12.0)
Neutro Abs: 3.5 10*3/uL (ref 1.4–7.7)
Neutrophils Relative %: 62.5 % (ref 43.0–77.0)
Platelets: 154 10*3/uL (ref 150.0–400.0)
RBC: 2.83 Mil/uL — ABNORMAL LOW (ref 4.22–5.81)
RDW: 15.4 % (ref 11.5–15.5)
WBC: 5.6 10*3/uL (ref 4.0–10.5)

## 2020-05-19 LAB — COMPREHENSIVE METABOLIC PANEL
ALT: 38 U/L (ref 0–53)
AST: 42 U/L — ABNORMAL HIGH (ref 0–37)
Albumin: 3.6 g/dL (ref 3.5–5.2)
Alkaline Phosphatase: 265 U/L — ABNORMAL HIGH (ref 39–117)
BUN: 49 mg/dL — ABNORMAL HIGH (ref 6–23)
CO2: 23 mEq/L (ref 19–32)
Calcium: 8.9 mg/dL (ref 8.4–10.5)
Chloride: 93 mEq/L — ABNORMAL LOW (ref 96–112)
Creatinine, Ser: 3.34 mg/dL — ABNORMAL HIGH (ref 0.40–1.50)
GFR: 17.36 mL/min — ABNORMAL LOW (ref >60.00)
Glucose, Bld: 145 mg/dL — ABNORMAL HIGH (ref 70–99)
Potassium: 5.1 mEq/L (ref 3.5–5.1)
Sodium: 123 mEq/L — ABNORMAL LOW (ref 135–145)
Total Bilirubin: 0.5 mg/dL (ref 0.2–1.2)
Total Protein: 7 g/dL (ref 6.0–8.3)

## 2020-05-19 LAB — FERRITIN: Ferritin: 168 ng/mL (ref 24–336)

## 2020-05-19 LAB — IRON AND TIBC
Iron: 100 ug/dL (ref 45–182)
Saturation Ratios: 33 % (ref 17.9–39.5)
TIBC: 305 ug/dL (ref 250–450)
UIBC: 205 ug/dL

## 2020-05-19 LAB — POCT HEMOGLOBIN-HEMACUE: Hemoglobin: 9.2 g/dL — ABNORMAL LOW (ref 13.0–17.0)

## 2020-05-19 MED ORDER — EPOETIN ALFA 10000 UNIT/ML IJ SOLN
INTRAMUSCULAR | Status: AC
  Filled 2020-05-19: qty 1

## 2020-05-19 MED ORDER — EPOETIN ALFA 10000 UNIT/ML IJ SOLN
10000.0000 [IU] | INTRAMUSCULAR | Status: DC
  Administered 2020-05-19: 10000 [IU] via SUBCUTANEOUS

## 2020-05-21 ENCOUNTER — Encounter (HOSPITAL_COMMUNITY): Payer: Medicare Other

## 2020-05-25 ENCOUNTER — Other Ambulatory Visit: Payer: Self-pay | Admitting: Family

## 2020-05-26 ENCOUNTER — Other Ambulatory Visit: Payer: Self-pay

## 2020-05-26 ENCOUNTER — Telehealth: Payer: Self-pay | Admitting: Cardiovascular Disease

## 2020-05-26 ENCOUNTER — Encounter (HOSPITAL_COMMUNITY)
Admission: RE | Admit: 2020-05-26 | Discharge: 2020-05-26 | Disposition: A | Discharge status: Home / Self Care | Payer: Medicare Other | Source: Ambulatory Visit | Attending: Nephrology | Admitting: Nephrology

## 2020-05-26 VITALS — BP 146/57 | HR 58 | Temp 97.3°F

## 2020-05-26 DIAGNOSIS — D649 Anemia, unspecified: Secondary | ICD-10-CM

## 2020-05-26 DIAGNOSIS — N184 Chronic kidney disease, stage 4 (severe): Secondary | ICD-10-CM | POA: Diagnosis not present

## 2020-05-26 LAB — POCT HEMOGLOBIN-HEMACUE: Hemoglobin: 8.7 g/dL — ABNORMAL LOW (ref 13.0–17.0)

## 2020-05-26 MED ORDER — EPOETIN ALFA 10000 UNIT/ML IJ SOLN
10000.0000 [IU] | INTRAMUSCULAR | Status: DC
  Administered 2020-05-26: 10000 [IU] via SUBCUTANEOUS

## 2020-05-26 MED ORDER — EPOETIN ALFA 10000 UNIT/ML IJ SOLN
INTRAMUSCULAR | Status: AC
  Filled 2020-05-26: qty 1

## 2020-05-26 MED ORDER — AMLODIPINE BESYLATE 2.5 MG PO TABS: 2.5000 mg | ORAL_TABLET | Freq: Every day | ORAL | 3 refills | Status: AC

## 2020-05-26 NOTE — Telephone Encounter (Signed)
New message   *STAT* If patient is at the pharmacy, call can be transferred to refill team.   1. Which medications need to be refilled? (please list name of each medication and dose if known) amLODipine (NORVASC) 2.5 MG tablet per patient, taking 5 mg daily.  2. Which pharmacy/location (including street and city if local pharmacy) is medication to be sent to? [id number] - [place], [place] - [address]  3. Do they need a 30 day or 90 day supply? 90 day

## 2020-05-26 NOTE — Telephone Encounter (Signed)
Pt's medication was sent to pt's pharmacy as requested. Confirmation received.  °

## 2020-05-27 ENCOUNTER — Ambulatory Visit (INDEPENDENT_AMBULATORY_CARE_PROVIDER_SITE_OTHER): Payer: Medicare Other

## 2020-05-27 ENCOUNTER — Other Ambulatory Visit: Payer: Self-pay

## 2020-05-27 VITALS — BP 120/60 | HR 71 | Temp 97.8°F | Resp 16 | Ht 67.0 in | Wt 139.4 lb

## 2020-05-27 DIAGNOSIS — Z0000 Encounter for general adult medical examination without abnormal findings: Secondary | ICD-10-CM

## 2020-05-27 NOTE — Progress Notes (Signed)
Subjective:   Gerald Jacobs is a 86 y.o. male who presents for Medicare Annual/Subsequent preventive examination.  Review of Systems    No ROS. Medicare Wellness Visit. Additional risk factors are reflected in social history. Cardiac Risk Factors include: advanced age (>2men, >67 women);diabetes mellitus;family history of premature cardiovascular disease;hypertension;male gender     Objective:    Today's Vitals   05/27/20 1126  BP: 120/60  Pulse: 71  Resp: 16  Temp: 97.8 F (36.6 C)  SpO2: 99%  Weight: 139 lb 6.4 oz (63.2 kg)  Height: 5\' 7"  (1.702 m)  PainSc: 0-No pain   Body mass index is 21.83 kg/m.  Advanced Directives 05/27/2020 05/23/2019 10/03/2018 08/22/2018 02/26/2018 06/15/2017 02/04/2014  Does Patient Have a Medical Advance Directive? Yes Yes Yes Yes Yes No Patient has advance directive, copy in chart  Type of Advance Directive [place];Living will [place];Living will Living will [place];Living will [place];Living will - [place]  Does patient want to make changes to medical advance directive? No - Patient declined - No - Patient declined - - - No change requested  Copy of [place] in Chart? No - copy requested No - copy requested - - No - copy requested - Copy requested from other (Comment)  Would patient like information on creating a medical advance directive? - - - - - No - Patient declined -  Pre-existing out of facility DNR order (yellow form or pink MOST form) - - - - - - No    Current Medications (verified) Outpatient Encounter Medications as of 05/27/2020  Medication Sig   allopurinol (ZYLOPRIM) 100 MG tablet TAKE 1 TABLET BY MOUTH EVERY DAY   amLODipine (NORVASC) 2.5 MG tablet Take 1 tablet (2.5 mg total) by mouth daily.   cholecalciferol (VITAMIN D) 1000 units tablet Take 1,000 Units by mouth 2 (two) times daily.    ELIQUIS 2.5 MG  TABS tablet TAKE 1 TABLET BY MOUTH TWICE A DAY   epoetin alfa (EPOGEN,PROCRIT) 33545 UNIT/ML injection Inject 10,000 Units into the vein once a week.   furosemide (LASIX) 20 MG tablet Take 20 mg by mouth as needed.   glucose blood (ONE TOUCH ULTRA TEST) test strip USE ONE STRIP PER TEST. CHECK BLOOD SUGARS 1-4 TIMES PER DAY AS INSTRUCTED.   JANUVIA 50 MG tablet TAKE 1 TABLET BY MOUTH EVERY DAY   metoprolol succinate (TOPROL-XL) 50 MG 24 hr tablet TAKE 1 TABLET (50 MG TOTAL) BY MOUTH 2 (TWO) TIMES DAILY. TAKE WITH OR IMMEDIATELY FOLLOWING A MEAL.   mupirocin ointment (BACTROBAN) 2 % Place 1 application into the nose once a week.   Omega-3 Fatty Acids (FISH OIL) 1000 MG CAPS Take 1,000 mg by mouth at bedtime.    ondansetron (ZOFRAN) 8 MG tablet Take by mouth every 8 (eight) hours as needed for nausea or vomiting.   OneTouch Delica Lancets 62B MISC TEST BLOOD SUGARS 1-4 TIMES A DAY AS INSTRUCTED. DX CODE: E11.9   pantoprazole (PROTONIX) 40 MG tablet Take 40 mg by mouth daily as needed (indigestion).    promethazine (PHENERGAN) 12.5 MG tablet Take 12.5 mg by mouth every 6 (six) hours as needed for nausea or vomiting.   protein supplement shake (PREMIER PROTEIN) LIQD Take 2 oz by mouth daily as needed.    triamcinolone cream (KENALOG) 0.1 % Apply 1 application topically as needed.   No facility-administered encounter medications on file as  of 05/27/2020.    Allergies (verified) Amoxicillin-pot clavulanate, Colchicine, Flexeril [cyclobenzaprine hcl], Levaquin [levofloxacin in d5w], Percocet [oxycodone-acetaminophen], Zebeta, and Bisoprolol fumarate   History: Past Medical History:  Diagnosis Date   Bilateral shoulder pain 07/28/2016   Cancer ([place])    prostate   Chest pain 10/27/2016   Chronic anemia    With normal iron studies and normal serum protein electrophoresis   Coronary artery disease    Mild   Cough 10/27/2016   Diabetes mellitus    Adult onset   Essential  hypertension    GERD (gastroesophageal reflux disease)    occ tums   History of gout    Hyperbilirubinemia 06/15/2017   Hyponatremia 06/15/2017   Intermittent palpitations 07/23/2014   Liver mass    Mass of bile duct 06/27/2017   Mild renal insufficiency    Nocturia    x2   Normocytic anemia 06/23/2016   Osteoarthritis    Sciatica of right side 07/28/2016   Statin myopathy 07/28/2016   Weight loss 06/15/2017   Past Surgical History:  Procedure Laterality Date   CHOLECYSTECTOMY  02/04/2014   DR Zella Richer   CHOLECYSTECTOMY N/A 02/04/2014   Procedure: LAPAROSCOPIC CHOLECYSTECTOMY with intraoperative cholangiogram;  Surgeon: Odis Hollingshead, MD;  Location: [facility];  Service: General;  Laterality: N/A;   ERCP N/A 10/15/2013   Procedure: ENDOSCOPIC RETROGRADE CHOLANGIOPANCREATOGRAPHY (ERCP);  Surgeon: Jeryl Columbia, MD;  Location: [facility];  Service: Endoscopy;  Laterality: N/A;   KNEE SURGERY Right 1979   melanoma surgery  1970's   on scalp   PROSTATE SURGERY  1993   Prostate Cancer   SPHINCTEROTOMY  10/15/2013   Procedure: SPHINCTEROTOMY;  Surgeon: Jeryl Columbia, MD;  Location: [facility];  Service: Endoscopy;;   Family History  Problem Relation Age of Onset   Stroke Father    Stroke Mother    Stroke Sister    Stroke Brother    Stroke Brother    Stroke Brother    Social History   Socioeconomic History   Marital status: Married    Spouse name: Not on file   Number of children: 2   Years of education: 14   Highest education level: Not on file  Occupational History   Occupation: retired  Tobacco Use   Smoking status: Former Smoker    Packs/day: 1.00    Years: 45.00    Pack years: 45.00    Types: Cigarettes    Quit date: 09/07/1979    Years since quitting: 40.7   Smokeless tobacco: Never Used  Scientific laboratory technician Use: Never used  Substance and Sexual Activity   Alcohol use: Yes    Comment: beer rarely   Drug use: No    Sexual activity: Not Currently  Other Topics Concern   Not on file  Social History Narrative       Social Determinants of Health   Financial Resource Strain: Low Risk    Difficulty of Paying Living Expenses: Not hard at all  Food Insecurity: No Food Insecurity   Worried About Charity fundraiser in the Last Year: Never true   [place] in the Last Year: Never true  Transportation Needs: No Transportation Needs   Lack of Transportation (Medical): No   Lack of Transportation (Non-Medical): No  Physical Activity: Unknown   Days of Exercise per Week: Patient refused   Minutes of Exercise per Session: Not on file  Stress: No Stress Concern Present   Feeling of  Stress : Not at all  Social Connections: Socially Integrated   Frequency of Communication with Friends and Family: More than three times a week   Frequency of Social Gatherings with Friends and Family: More than three times a week   Attends Religious Services: More than 4 times per year   Active Member of [employer] or Organizations: Yes   Attends Music therapist: More than 4 times per year   Marital Status: Married    Tobacco Counseling Counseling given: No   Clinical Intake:  Pre-visit preparation completed: Yes  Pain : No/denies pain Pain Score: 0-No pain     BMI - recorded: 21.83 Nutritional Status: BMI of 19-24  Normal Nutritional Risks: None Diabetes: No  How often do you need to have someone help you when you read instructions, pamphlets, or other written materials from your doctor or pharmacy?: 1 - Never What is the last grade level you completed in school?: HSG  Diabetic? yes  Interpreter Needed?: No  Information entered by :: Zadie Deemer N. Fallon Haecker, LPN   Activities of Daily Living In your present state of health, do you have any difficulty performing the following activities: 05/27/2020  Hearing? Y  Comment wears hearing aids  Vision? N  Difficulty concentrating or  making decisions? N  Walking or climbing stairs? Y  Comment uses a cane for balanace/gait  Dressing or bathing? N  Doing errands, shopping? N  Preparing Food and eating ? N  Using the Toilet? N  In the past six months, have you accidently leaked urine? N  Do you have problems with loss of bowel control? N  Managing your Medications? N  Managing your Finances? N  Housekeeping or managing your Housekeeping? N  Some recent data might be hidden    Patient Care Team: Marrian Salvage, FNP as PCP - General (Internal Medicine) Nahser, Wonda Cheng, MD as PCP - Cardiology (Cardiology)  Indicate any recent Medical Services you may have received from other than [facility] providers in the past year (date may be approximate).     Assessment:   This is a routine wellness examination for Gerald Jacobs.  Hearing/Vision screen No exam data present  Dietary issues and exercise activities discussed: Current Exercise Habits: The patient does not participate in regular exercise at present, Exercise limited by: cardiac condition(s);orthopedic condition(s)  Goals      Patient Stated      Stay as healthy and as independent as possible.       Patient Stated (pt-stated)      Would like to have a better appetite and regain my strength.      Depression Screen PHQ 2/9 Scores 05/27/2020 05/23/2019 02/26/2018 01/24/2017 01/18/2016  PHQ - 2 Score 0 1 2 0 2  PHQ- 9 Score - - 5 - 5    Fall Risk Fall Risk  05/27/2020 05/23/2019 02/26/2018 01/24/2017  Falls in the past year? 0 0 No Yes  Number falls in past yr: 0 0 - 1  Injury with Fall? 0 - - Yes  Risk for fall due to : Impaired balance/gait - - -  Follow up Falls evaluation completed;Education provided Falls prevention discussed - -    Any stairs in or around the home? No  If so, are there any without handrails? No  Home free of loose throw rugs in walkways, pet beds, electrical cords, etc? Yes  Adequate lighting in your home to reduce risk of falls? Yes    ASSISTIVE DEVICES UTILIZED TO PREVENT FALLS:  Life alert? No  Use of a cane, walker or w/c? Yes  Grab bars in the bathroom? Yes  Shower chair or bench in shower? Yes  Elevated toilet seat or a handicapped toilet? Yes   TIMED UP AND GO:  Was the test performed? no Length of time to ambulate 10 feet: 0 sec.   Gait steady and fast with assistive device  Cognitive Function: MMSE - Mini Mental State Exam 02/26/2018  Orientation to time 5  Orientation to Place 5  Registration 3  Attention/ Calculation 5  Recall 3  Language- name 2 objects 2  Language- repeat 1  Language- follow 3 step command 3  Language- read & follow direction 1  Write a sentence 1  Copy design 1  Total score 30     6CIT Screen 05/27/2020  What Year? 0 points  What month? 0 points  What time? 0 points  Count back from 20 0 points  Months in reverse 0 points  Repeat phrase 2 points  Total Score 2    Immunizations Immunization History  Administered Date(s) Administered   Fluad Quad(high Dose 65+) 09/04/2019   Influenza, High Dose Seasonal PF 09/21/2017, 09/05/2018   Influenza,inj,Quad PF,6+ Mos 09/30/2015   Influenza-Unspecified 08/28/2014, 08/28/2018   PFIZER SARS-COV-2 Vaccination 12/12/2019, 01/03/2020   Pneumococcal Conjugate-13 01/18/2016   Pneumococcal Polysaccharide-23 02/05/2014    TDAP status: Due, Education has been provided regarding the importance of this vaccine. Advised may receive this vaccine at local pharmacy or Health Dept. Aware to provide a copy of the vaccination record if obtained from local pharmacy or Health Dept. Verbalized acceptance and understanding. Flu Vaccine status: Up to date Pneumococcal vaccine status: Up to date Covid-19 vaccine status: Completed vaccines  Qualifies for Shingles Vaccine? Yes   Zostavax completed No   Shingrix Completed?: No.    Education has been provided regarding the importance of this vaccine. Patient has been advised to call  insurance company to determine out of pocket expense if they have not yet received this vaccine. Advised may also receive vaccine at local pharmacy or Health Dept. Verbalized acceptance and understanding.  Screening Tests Health Maintenance  Topic Date Due   TETANUS/TDAP  Never done   OPHTHALMOLOGY EXAM  09/28/2018   URINE MICROALBUMIN  05/22/2019   HEMOGLOBIN A1C  06/03/2019   FOOT EXAM  05/22/2020   INFLUENZA VACCINE  06/28/2020   COVID-19 Vaccine  Completed   PNA vac Low Risk Adult  Completed    Health Maintenance  Health Maintenance Due  Topic Date Due   TETANUS/TDAP  Never done   OPHTHALMOLOGY EXAM  09/28/2018   URINE MICROALBUMIN  05/22/2019   HEMOGLOBIN A1C  06/03/2019   FOOT EXAM  05/22/2020    Colorectal cancer screening: No longer required.   Lung Cancer Screening: (Low Dose CT Chest recommended if Age 31-80 years, 30 pack-year currently smoking OR have quit w/in 15years.) does not qualify.   Lung Cancer Screening Referral: no  Additional Screening:  Hepatitis C Screening: does not qualify; Completed no  Vision Screening: Recommended annual ophthalmology exams for early detection of glaucoma and other disorders of the eye. Is the patient up to date with their annual eye exam?  Yes  Who is the provider or what is the name of the office in which the patient attends annual eye exams? [facility] If pt is not established with a provider, would they like to be referred to a provider to establish care? No .   Dental  Screening: Recommended annual dental exams for proper oral hygiene  Community Resource Referral / Chronic Care Management: CRR required this visit?  No   CCM required this visit?  No      Plan:    Reviewed health maintenance screenings with patient today and relevant education, vaccines, and/or referrals were provided.    Continue doing brain stimulating activities (puzzles, reading, adult coloring books, staying active) to  keep memory sharp.    Continue to eat heart healthy diet (full of fruits, vegetables, whole grains, lean protein, water--limit salt, fat, and sugar intake) and increase physical activity as tolerated.  I have personally reviewed and noted the following in the patients chart:    Medical and social history  Use of alcohol, tobacco or illicit drugs   Current medications and supplements  Functional ability and status  Nutritional status  Physical activity  Advanced directives  List of other physicians  Hospitalizations, surgeries, and ER visits in previous 12 months  Vitals  Screenings to include cognitive, depression, and falls  Referrals and appointments  In addition, I have reviewed and discussed with patient certain preventive protocols, quality metrics, and best practice recommendations. A written personalized care plan for preventive services as well as general preventive health recommendations were provided to patient.     Sheral Flow, LPN   [id number]   Nurse Notes:

## 2020-05-27 NOTE — Patient Instructions (Signed)
Gerald Jacobs , Thank you for taking time to come for your Medicare Wellness Visit. I appreciate your ongoing commitment to your health goals. Please review the following plan we discussed and let me know if I can assist you in the future.   Screening recommendations/referrals: Colonoscopy: no repeat due to age Recommended yearly ophthalmology/optometry visit for glaucoma screening and checkup Recommended yearly dental visit for hygiene and checkup  Vaccinations: Influenza vaccine: 09/30/2019 Pneumococcal vaccine: completed Tdap vaccine: never done Shingles vaccine: never done   Covid-19: completed  Advanced directives: Please bring a copy of your health care power of attorney and living will to the office at your convenience.  Conditions/risks identified: Please continue to do your personal lifestyle choices by: daily care of teeth and gums, regular physical activity (goal should be 5 days a week for 30 minutes), eat a healthy diet, avoid tobacco and drug use, limiting any alcohol intake, taking a low-dose aspirin (if not allergic or have been advised by your provider otherwise) and taking vitamins and minerals as recommended by your provider. Continue doing brain stimulating activities (puzzles, reading, adult coloring books, staying active) to keep memory sharp. Continue to eat heart healthy diet (full of fruits, vegetables, whole grains, lean protein, water--limit salt, fat, and sugar intake) and increase physical activity as tolerated.  Next appointment: Please schedule your next Medicare Wellness Visit with your Nurse Health Advisor in 1 year.  Preventive Care 84 Years and Older, Male Preventive care refers to lifestyle choices and visits with your health care provider that can promote health and wellness. What does preventive care include?  A yearly physical exam. This is also called an annual well check.  Dental exams once or twice a year.  Routine eye exams. Ask your health care  provider how often you should have your eyes checked.  Personal lifestyle choices, including:  Daily care of your teeth and gums.  Regular physical activity.  Eating a healthy diet.  Avoiding tobacco and drug use.  Limiting alcohol use.  Practicing safe sex.  Taking low doses of aspirin every day.  Taking vitamin and mineral supplements as recommended by your health care provider. What happens during an annual well check? The services and screenings done by your health care provider during your annual well check will depend on your age, overall health, lifestyle risk factors, and family history of disease. Counseling  Your health care provider may ask you questions about your:  Alcohol use.  Tobacco use.  Drug use.  Emotional well-being.  Home and relationship well-being.  Sexual activity.  Eating habits.  History of falls.  Memory and ability to understand (cognition).  Work and work Statistician. Screening  You may have the following tests or measurements:  Height, weight, and BMI.  Blood pressure.  Lipid and cholesterol levels. These may be checked every 5 years, or more frequently if you are over 5 years old.  Skin check.  Lung cancer screening. You may have this screening every year starting at age 27 if you have a 30-pack-year history of smoking and currently smoke or have quit within the past 15 years.  Fecal occult blood test (FOBT) of the stool. You may have this test every year starting at age 43.  Flexible sigmoidoscopy or colonoscopy. You may have a sigmoidoscopy every 5 years or a colonoscopy every 10 years starting at age 27.  Prostate cancer screening. Recommendations will vary depending on your family history and other risks.  Hepatitis C blood test.  Hepatitis  B blood test.  Sexually transmitted disease (STD) testing.  Diabetes screening. This is done by checking your blood sugar (glucose) after you have not eaten for a while  (fasting). You may have this done every 1-3 years.  Abdominal aortic aneurysm (AAA) screening. You may need this if you are a current or former smoker.  Osteoporosis. You may be screened starting at age 28 if you are at high risk. Talk with your health care provider about your test results, treatment options, and if necessary, the need for more tests. Vaccines  Your health care provider may recommend certain vaccines, such as:  Influenza vaccine. This is recommended every year.  Tetanus, diphtheria, and acellular pertussis (Tdap, Td) vaccine. You may need a Td booster every 10 years.  Zoster vaccine. You may need this after age 54.  Pneumococcal 13-valent conjugate (PCV13) vaccine. One dose is recommended after age 32.  Pneumococcal polysaccharide (PPSV23) vaccine. One dose is recommended after age 4. Talk to your health care provider about which screenings and vaccines you need and how often you need them. This information is not intended to replace advice given to you by your health care provider. Make sure you discuss any questions you have with your health care provider. Document Released: 12/11/2015 Document Revised: 08/03/2016 Document Reviewed: 09/15/2015 Elsevier Interactive Patient Education  2017 [place] Prevention in the Home Falls can cause injuries. They can happen to people of all ages. There are many things you can do to make your home safe and to help prevent falls. What can I do on the outside of my home?  Regularly fix the edges of walkways and driveways and fix any cracks.  Remove anything that might make you trip as you walk through a door, such as a raised step or threshold.  Trim any bushes or trees on the path to your home.  Use bright outdoor lighting.  Clear any walking paths of anything that might make someone trip, such as rocks or tools.  Regularly check to see if handrails are loose or broken. Make sure that both sides of any steps have  handrails.  Any raised decks and porches should have guardrails on the edges.  Have any leaves, snow, or ice cleared regularly.  Use sand or salt on walking paths during winter.  Clean up any spills in your garage right away. This includes oil or grease spills. What can I do in the bathroom?  Use night lights.  Install grab bars by the toilet and in the tub and shower. Do not use towel bars as grab bars.  Use non-skid mats or decals in the tub or shower.  If you need to sit down in the shower, use a plastic, non-slip stool.  Keep the floor dry. Clean up any water that spills on the floor as soon as it happens.  Remove soap buildup in the tub or shower regularly.  Attach bath mats securely with double-sided non-slip rug tape.  Do not have throw rugs and other things on the floor that can make you trip. What can I do in the bedroom?  Use night lights.  Make sure that you have a light by your bed that is easy to reach.  Do not use any sheets or blankets that are too big for your bed. They should not hang down onto the floor.  Have a firm chair that has side arms. You can use this for support while you get dressed.  Do not have  throw rugs and other things on the floor that can make you trip. What can I do in the kitchen?  Clean up any spills right away.  Avoid walking on wet floors.  Keep items that you use a lot in easy-to-reach places.  If you need to reach something above you, use a strong step stool that has a grab bar.  Keep electrical cords out of the way.  Do not use floor polish or wax that makes floors slippery. If you must use wax, use non-skid floor wax.  Do not have throw rugs and other things on the floor that can make you trip. What can I do with my stairs?  Do not leave any items on the stairs.  Make sure that there are handrails on both sides of the stairs and use them. Fix handrails that are broken or loose. Make sure that handrails are as long as  the stairways.  Check any carpeting to make sure that it is firmly attached to the stairs. Fix any carpet that is loose or worn.  Avoid having throw rugs at the top or bottom of the stairs. If you do have throw rugs, attach them to the floor with carpet tape.  Make sure that you have a light switch at the top of the stairs and the bottom of the stairs. If you do not have them, ask someone to add them for you. What else can I do to help prevent falls?  Wear shoes that:  Do not have high heels.  Have rubber bottoms.  Are comfortable and fit you well.  Are closed at the toe. Do not wear sandals.  If you use a stepladder:  Make sure that it is fully opened. Do not climb a closed stepladder.  Make sure that both sides of the stepladder are locked into place.  Ask someone to hold it for you, if possible.  Clearly mark and make sure that you can see:  Any grab bars or handrails.  First and last steps.  Where the edge of each step is.  Use tools that help you move around (mobility aids) if they are needed. These include:  Canes.  Walkers.  Scooters.  Crutches.  Turn on the lights when you go into a dark area. Replace any light bulbs as soon as they burn out.  Set up your furniture so you have a clear path. Avoid moving your furniture around.  If any of your floors are uneven, fix them.  If there are any pets around you, be aware of where they are.  Review your medicines with your doctor. Some medicines can make you feel dizzy. This can increase your chance of falling. Ask your doctor what other things that you can do to help prevent falls. This information is not intended to replace advice given to you by your health care provider. Make sure you discuss any questions you have with your health care provider. Document Released: 09/10/2009 Document Revised: 04/21/2016 Document Reviewed: 12/19/2014 Elsevier Interactive Patient Education  2017 [employer].

## 2020-06-02 ENCOUNTER — Encounter (HOSPITAL_COMMUNITY)
Admission: RE | Admit: 2020-06-02 | Discharge: 2020-06-02 | Disposition: A | Discharge status: Home / Self Care | Payer: Medicare Other | Source: Ambulatory Visit | Attending: Nephrology | Admitting: Nephrology

## 2020-06-02 ENCOUNTER — Telehealth: Payer: Self-pay

## 2020-06-02 ENCOUNTER — Other Ambulatory Visit: Payer: Self-pay

## 2020-06-02 ENCOUNTER — Other Ambulatory Visit (INDEPENDENT_AMBULATORY_CARE_PROVIDER_SITE_OTHER): Payer: Medicare Other

## 2020-06-02 VITALS — BP 157/52 | HR 92 | Temp 97.5°F | Resp 20

## 2020-06-02 DIAGNOSIS — N184 Chronic kidney disease, stage 4 (severe): Secondary | ICD-10-CM | POA: Insufficient documentation

## 2020-06-02 DIAGNOSIS — D631 Anemia in chronic kidney disease: Secondary | ICD-10-CM | POA: Insufficient documentation

## 2020-06-02 DIAGNOSIS — C221 Intrahepatic bile duct carcinoma: Secondary | ICD-10-CM | POA: Diagnosis not present

## 2020-06-02 DIAGNOSIS — D649 Anemia, unspecified: Secondary | ICD-10-CM

## 2020-06-02 LAB — CBC WITH DIFFERENTIAL/PLATELET
Basophils Absolute: 0 10*3/uL (ref 0.0–0.1)
Basophils Relative: 0.6 % (ref 0.0–3.0)
Eosinophils Absolute: 0.5 10*3/uL (ref 0.0–0.7)
Eosinophils Relative: 7 % — ABNORMAL HIGH (ref 0.0–5.0)
HCT: 23.9 % — CL (ref 39.0–52.0)
Hemoglobin: 8 g/dL — CL (ref 13.0–17.0)
Lymphocytes Relative: 13.7 % (ref 12.0–46.0)
Lymphs Abs: 0.9 10*3/uL (ref 0.7–4.0)
MCHC: 33.5 g/dL (ref 30.0–36.0)
MCV: 96.8 fl (ref 78.0–100.0)
Monocytes Absolute: 0.7 10*3/uL (ref 0.1–1.0)
Monocytes Relative: 10.6 % (ref 3.0–12.0)
Neutro Abs: 4.4 10*3/uL (ref 1.4–7.7)
Neutrophils Relative %: 68.1 % (ref 43.0–77.0)
Platelets: 140 10*3/uL — ABNORMAL LOW (ref 150.0–400.0)
RBC: 2.47 Mil/uL — ABNORMAL LOW (ref 4.22–5.81)
RDW: 16.5 % — ABNORMAL HIGH (ref 11.5–15.5)
WBC: 6.5 10*3/uL (ref 4.0–10.5)

## 2020-06-02 LAB — COMPREHENSIVE METABOLIC PANEL
ALT: 34 U/L (ref 0–53)
AST: 38 U/L — ABNORMAL HIGH (ref 0–37)
Albumin: 3.5 g/dL (ref 3.5–5.2)
Alkaline Phosphatase: 220 U/L — ABNORMAL HIGH (ref 39–117)
BUN: 44 mg/dL — ABNORMAL HIGH (ref 6–23)
CO2: 22 mEq/L (ref 19–32)
Calcium: 8.6 mg/dL (ref 8.4–10.5)
Chloride: 102 mEq/L (ref 96–112)
Creatinine, Ser: 3.16 mg/dL — ABNORMAL HIGH (ref 0.40–1.50)
GFR: 18.5 mL/min — ABNORMAL LOW (ref >60.00)
Glucose, Bld: 162 mg/dL — ABNORMAL HIGH (ref 70–99)
Potassium: 5.2 mEq/L — ABNORMAL HIGH (ref 3.5–5.1)
Sodium: 132 mEq/L — ABNORMAL LOW (ref 135–145)
Total Bilirubin: 0.5 mg/dL (ref 0.2–1.2)
Total Protein: 6.7 g/dL (ref 6.0–8.3)

## 2020-06-02 MED ORDER — EPOETIN ALFA 10000 UNIT/ML IJ SOLN
INTRAMUSCULAR | Status: AC
  Filled 2020-06-02: qty 1

## 2020-06-02 MED ORDER — EPOETIN ALFA 10000 UNIT/ML IJ SOLN
10000.0000 [IU] | INTRAMUSCULAR | Status: DC
  Administered 2020-06-02: 10000 [IU] via SUBCUTANEOUS

## 2020-06-02 NOTE — Telephone Encounter (Signed)
CRITICAL VALUE STICKER  CRITICAL VALUE: Hemoglobin 8.0; Hematocrit  23.9  RECEIVER (on-site recipient of call): Rexford Maus, [place] NOTIFIED: 06/02/2020 10:38am  MESSENGER (representative from lab): Santiago Glad  MD NOTIFIED: High Priority message sent to PCP.   TIME OF NOTIFICATION: 10:40am

## 2020-06-02 NOTE — Telephone Encounter (Signed)
See Lab report. [name] contacted daughter....closing encounter.Marland KitchenJerolyn Center, [place]  06/02/2020 12:55 PM EDT Back to Top    I spoke with patient's daughter. Per daughter, patient is doing very well; no dark stools; he was told by nephrology that his hemoglobin was 9.8 this am and was given his Procrit injection. I have reached out to his nephrologist for clarification

## 2020-06-02 NOTE — Progress Notes (Signed)
I spoke with patient's daughter. Per daughter, patient is doing very well; no dark stools; he was told by nephrology that his hemoglobin was 9.8 this am and was given his Procrit injection. I have reached out to his nephrologist for clarification.

## 2020-06-03 ENCOUNTER — Telehealth: Payer: Self-pay | Admitting: Family

## 2020-06-03 DIAGNOSIS — N189 Chronic kidney disease, unspecified: Secondary | ICD-10-CM

## 2020-06-03 LAB — POCT HEMOGLOBIN-HEMACUE: Hemoglobin: 9.8 g/dL — ABNORMAL LOW (ref 13.0–17.0)

## 2020-06-03 NOTE — Telephone Encounter (Signed)
Discussed with patient's daughter; she is in agreement; patient will go to [place] tomorrow to get labs re-drawn.

## 2020-06-03 NOTE — Telephone Encounter (Signed)
-----   Message from Elmarie Shiley, MD sent at 06/02/2020  2:08 PM EDT ----- Regarding: RE: Gerald Jacobs, His hemoglobin trend at [facility] at [facility] has been: 9.1 on 6/22 8.7 on 6/29 8.0 today I agree with hemoccult testing and not sending him to the ER. I will increase his EPO dose and follow up on his iron stores.  If he has been feeling clinically well, indeed a recheck of the H/H on 7/8 is a sound plan.  Thanks  Elmarie Shiley ----- Message ----- From: Marrian Salvage, FNP Sent: 06/02/2020  12:36 PM EDT To: Elmarie Shiley, MD   Dr. Posey Pronto,  I wanted to touch base with you about Gerald Jacobs. I got a critical hemoglobin of 8 today. He told his daughter that your office got a 9.8 and I can see he got his Procrit injection this morning. Can you verify the hemoglobin value he got from your office today?  Per [name], he is doing very well and eating well/ no dark stools. I am not going to send him to the ER today but may just re-check the hemoglobin in the next 1-2 days unless you have a different opinion?  Thanks, Jodi Mourning, FNP

## 2020-06-04 ENCOUNTER — Other Ambulatory Visit (INDEPENDENT_AMBULATORY_CARE_PROVIDER_SITE_OTHER): Payer: Medicare Other

## 2020-06-04 DIAGNOSIS — N189 Chronic kidney disease, unspecified: Secondary | ICD-10-CM

## 2020-06-04 DIAGNOSIS — D631 Anemia in chronic kidney disease: Secondary | ICD-10-CM | POA: Diagnosis not present

## 2020-06-04 LAB — IRON,TIBC AND FERRITIN PANEL
%SAT: 26 % (calc) (ref 20–48)
Ferritin: 75 ng/mL (ref 24–380)
Iron: 76 ug/dL (ref 50–180)
TIBC: 292 mcg/dL (calc) (ref 250–425)

## 2020-06-04 LAB — CBC WITH DIFFERENTIAL/PLATELET
Basophils Absolute: 0 10*3/uL (ref 0.0–0.1)
Basophils Relative: 0.7 % (ref 0.0–3.0)
Eosinophils Absolute: 0.4 10*3/uL (ref 0.0–0.7)
Eosinophils Relative: 6.7 % — ABNORMAL HIGH (ref 0.0–5.0)
HCT: 24.8 % — ABNORMAL LOW (ref 39.0–52.0)
Hemoglobin: 8.2 g/dL — ABNORMAL LOW (ref 13.0–17.0)
Lymphocytes Relative: 13.2 % (ref 12.0–46.0)
Lymphs Abs: 0.8 10*3/uL (ref 0.7–4.0)
MCHC: 33.1 g/dL (ref 30.0–36.0)
MCV: 97.1 fl (ref 78.0–100.0)
Monocytes Absolute: 0.7 10*3/uL (ref 0.1–1.0)
Monocytes Relative: 11.3 % (ref 3.0–12.0)
Neutro Abs: 4.1 10*3/uL (ref 1.4–7.7)
Neutrophils Relative %: 68.1 % (ref 43.0–77.0)
Platelets: 146 10*3/uL — ABNORMAL LOW (ref 150.0–400.0)
RBC: 2.55 Mil/uL — ABNORMAL LOW (ref 4.22–5.81)
RDW: 16.2 % — ABNORMAL HIGH (ref 11.5–15.5)
WBC: 6 10*3/uL (ref 4.0–10.5)

## 2020-06-05 ENCOUNTER — Other Ambulatory Visit: Payer: Self-pay | Admitting: Family

## 2020-06-09 ENCOUNTER — Encounter (HOSPITAL_COMMUNITY)
Admission: RE | Admit: 2020-06-09 | Discharge: 2020-06-09 | Disposition: A | Discharge status: Home / Self Care | Payer: Medicare Other | Source: Ambulatory Visit | Attending: Nephrology | Admitting: Nephrology

## 2020-06-09 ENCOUNTER — Other Ambulatory Visit: Payer: Self-pay

## 2020-06-09 VITALS — BP 163/56 | HR 64 | Temp 98.0°F | Resp 18

## 2020-06-09 DIAGNOSIS — N184 Chronic kidney disease, stage 4 (severe): Secondary | ICD-10-CM | POA: Diagnosis not present

## 2020-06-09 DIAGNOSIS — D649 Anemia, unspecified: Secondary | ICD-10-CM

## 2020-06-09 LAB — POCT HEMOGLOBIN-HEMACUE: Hemoglobin: 8.9 g/dL — ABNORMAL LOW (ref 13.0–17.0)

## 2020-06-09 MED ORDER — EPOETIN ALFA 10000 UNIT/ML IJ SOLN
INTRAMUSCULAR | Status: AC
  Filled 2020-06-09: qty 1

## 2020-06-09 MED ORDER — EPOETIN ALFA 10000 UNIT/ML IJ SOLN: 10000.0000 [IU] | INTRAMUSCULAR | Status: DC

## 2020-06-16 ENCOUNTER — Encounter (HOSPITAL_COMMUNITY)
Admission: RE | Admit: 2020-06-16 | Discharge: 2020-06-16 | Disposition: A | Discharge status: Home / Self Care | Payer: Medicare Other | Source: Ambulatory Visit | Attending: Nephrology | Admitting: Nephrology

## 2020-06-16 ENCOUNTER — Other Ambulatory Visit: Payer: Self-pay

## 2020-06-16 VITALS — BP 180/58 | HR 65 | Temp 97.2°F | Resp 20

## 2020-06-16 DIAGNOSIS — D649 Anemia, unspecified: Secondary | ICD-10-CM

## 2020-06-16 DIAGNOSIS — D631 Anemia in chronic kidney disease: Secondary | ICD-10-CM | POA: Insufficient documentation

## 2020-06-16 DIAGNOSIS — N184 Chronic kidney disease, stage 4 (severe): Secondary | ICD-10-CM | POA: Diagnosis present

## 2020-06-16 LAB — FERRITIN: Ferritin: 60 ng/mL (ref 24–336)

## 2020-06-16 LAB — IRON AND TIBC
Iron: 34 ug/dL — ABNORMAL LOW (ref 45–182)
Saturation Ratios: 10 % — ABNORMAL LOW (ref 17.9–39.5)
TIBC: 342 ug/dL (ref 250–450)
UIBC: 308 ug/dL

## 2020-06-16 LAB — POCT HEMOGLOBIN-HEMACUE: Hemoglobin: 8.3 g/dL — ABNORMAL LOW (ref 13.0–17.0)

## 2020-06-16 MED ORDER — EPOETIN ALFA 10000 UNIT/ML IJ SOLN
INTRAMUSCULAR | Status: AC
  Filled 2020-06-16: qty 1

## 2020-06-16 MED ORDER — EPOETIN ALFA 10000 UNIT/ML IJ SOLN
10000.0000 [IU] | INTRAMUSCULAR | Status: DC
  Administered 2020-06-16: 10000 [IU] via SUBCUTANEOUS

## 2020-06-22 ENCOUNTER — Other Ambulatory Visit: Payer: Self-pay | Admitting: Family

## 2020-06-23 ENCOUNTER — Encounter (HOSPITAL_COMMUNITY)
Admission: RE | Admit: 2020-06-23 | Discharge: 2020-06-23 | Disposition: A | Discharge status: Home / Self Care | Payer: Medicare Other | Source: Ambulatory Visit | Attending: Nephrology | Admitting: Nephrology

## 2020-06-23 ENCOUNTER — Other Ambulatory Visit: Payer: Self-pay

## 2020-06-23 VITALS — BP 167/58 | HR 72 | Temp 97.4°F | Resp 20

## 2020-06-23 DIAGNOSIS — N184 Chronic kidney disease, stage 4 (severe): Secondary | ICD-10-CM | POA: Diagnosis not present

## 2020-06-23 DIAGNOSIS — D649 Anemia, unspecified: Secondary | ICD-10-CM

## 2020-06-23 LAB — POCT HEMOGLOBIN-HEMACUE: Hemoglobin: 7.6 g/dL — ABNORMAL LOW (ref 13.0–17.0)

## 2020-06-23 MED ORDER — EPOETIN ALFA 20000 UNIT/ML IJ SOLN: 20000.0000 [IU] | INTRAMUSCULAR | Status: DC

## 2020-06-23 MED ORDER — EPOETIN ALFA 10000 UNIT/ML IJ SOLN: 10000.0000 [IU] | INTRAMUSCULAR | Status: DC

## 2020-06-23 MED ORDER — EPOETIN ALFA 10000 UNIT/ML IJ SOLN
INTRAMUSCULAR | Status: AC
  Filled 2020-06-23: qty 1

## 2020-06-23 MED ORDER — EPOETIN ALFA 20000 UNIT/ML IJ SOLN
INTRAMUSCULAR | Status: AC
  Administered 2020-06-23: 20000 [IU]
  Filled 2020-06-23: qty 1

## 2020-06-23 NOTE — Progress Notes (Addendum)
hgb 7.6 via hemocue, pt denies chest pain, denies SOB, denies bleeding, MD notified. Increase procrit 20,000 units q2weeks.   Will continue to monitor.

## 2020-06-24 ENCOUNTER — Encounter: Payer: Self-pay | Admitting: Family

## 2020-06-24 ENCOUNTER — Other Ambulatory Visit: Payer: Self-pay | Admitting: Family

## 2020-06-24 DIAGNOSIS — C221 Intrahepatic bile duct carcinoma: Secondary | ICD-10-CM

## 2020-06-25 ENCOUNTER — Telehealth: Payer: Self-pay | Admitting: *Deleted

## 2020-06-25 ENCOUNTER — Other Ambulatory Visit (INDEPENDENT_AMBULATORY_CARE_PROVIDER_SITE_OTHER): Payer: Medicare Other

## 2020-06-25 DIAGNOSIS — C221 Intrahepatic bile duct carcinoma: Secondary | ICD-10-CM | POA: Diagnosis not present

## 2020-06-25 LAB — CBC WITH DIFFERENTIAL/PLATELET
Basophils Absolute: 0.1 10*3/uL (ref 0.0–0.1)
Basophils Relative: 0.8 % (ref 0.0–3.0)
Eosinophils Absolute: 0.4 10*3/uL (ref 0.0–0.7)
Eosinophils Relative: 5.3 % — ABNORMAL HIGH (ref 0.0–5.0)
HCT: 23.3 % — CL (ref 39.0–52.0)
Hemoglobin: 7.6 g/dL — CL (ref 13.0–17.0)
Lymphocytes Relative: 10.3 % — ABNORMAL LOW (ref 12.0–46.0)
Lymphs Abs: 0.7 10*3/uL (ref 0.7–4.0)
MCHC: 32.8 g/dL (ref 30.0–36.0)
MCV: 98 fl (ref 78.0–100.0)
Monocytes Absolute: 0.7 10*3/uL (ref 0.1–1.0)
Monocytes Relative: 9.1 % (ref 3.0–12.0)
Neutro Abs: 5.4 10*3/uL (ref 1.4–7.7)
Neutrophils Relative %: 74.5 % (ref 43.0–77.0)
Platelets: 150 10*3/uL (ref 150.0–400.0)
RBC: 2.38 Mil/uL — ABNORMAL LOW (ref 4.22–5.81)
RDW: 16.7 % — ABNORMAL HIGH (ref 11.5–15.5)
WBC: 7.2 10*3/uL (ref 4.0–10.5)

## 2020-06-25 LAB — COMPREHENSIVE METABOLIC PANEL
ALT: 37 U/L (ref 0–53)
AST: 42 U/L — ABNORMAL HIGH (ref 0–37)
Albumin: 3.4 g/dL — ABNORMAL LOW (ref 3.5–5.2)
Alkaline Phosphatase: 222 U/L — ABNORMAL HIGH (ref 39–117)
BUN: 51 mg/dL — ABNORMAL HIGH (ref 6–23)
CO2: 22 mEq/L (ref 19–32)
Calcium: 8.6 mg/dL (ref 8.4–10.5)
Chloride: 102 mEq/L (ref 96–112)
Creatinine, Ser: 3.68 mg/dL — ABNORMAL HIGH (ref 0.40–1.50)
GFR: 15.52 mL/min — ABNORMAL LOW (ref >60.00)
Glucose, Bld: 216 mg/dL — ABNORMAL HIGH (ref 70–99)
Potassium: 5.3 mEq/L — ABNORMAL HIGH (ref 3.5–5.1)
Sodium: 130 mEq/L — ABNORMAL LOW (ref 135–145)
Total Bilirubin: 0.6 mg/dL (ref 0.2–1.2)
Total Protein: 6.9 g/dL (ref 6.0–8.3)

## 2020-06-25 NOTE — Telephone Encounter (Signed)
H/H stable compared to 2 days ago - will have [name] review tomorrow and decide on plan

## 2020-06-25 NOTE — Telephone Encounter (Signed)
Received critical lab - Hemoglobin 7.6 & Hematocrit 23.3. Gerald Jacobs is out of the office pls advise on labs.Marland KitchenJohny Chess

## 2020-06-25 NOTE — Addendum Note (Signed)
Addended by: Jacob Moores on: 06/25/2020 10:07 AM   Modules accepted: Orders

## 2020-06-26 ENCOUNTER — Other Ambulatory Visit: Payer: Self-pay | Admitting: Family

## 2020-06-26 ENCOUNTER — Telehealth: Payer: Self-pay | Admitting: Family

## 2020-06-26 ENCOUNTER — Encounter: Payer: Self-pay | Admitting: Family

## 2020-06-26 DIAGNOSIS — C221 Intrahepatic bile duct carcinoma: Secondary | ICD-10-CM

## 2020-06-26 NOTE — Telephone Encounter (Signed)
Attempted to reach daughter regarding nephrology recommendation at this time; will send a MyChart message.

## 2020-06-26 NOTE — Telephone Encounter (Signed)
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 I will increase his procrit dose to try improve the H/H. He was last here 7/12 and looked clinically stable. I do not recommend PRBCs unless Hgb <7 or he has symptoms.  Tough situation. Thanks for what you do.  Elmarie Shiley  ----- Message ----- From: Marrian Salvage, FNP Sent: 06/26/2020   9:37 AM EDT To: Elmarie Shiley, MD  Dr. Gilman Buttner asked me to re-check Mr. Lech's hemoglobin after the last visit with you. He is still staying at 7.6. I have not physically seen him. Is your thought still related to the CKD? I will not be in the office next week so I want to make sure she has some type treatment plan for the next week.   Thanks, Jodi Mourning, FNP

## 2020-06-28 ENCOUNTER — Encounter: Payer: Self-pay | Admitting: Family

## 2020-06-28 DIAGNOSIS — C221 Intrahepatic bile duct carcinoma: Secondary | ICD-10-CM

## 2020-06-28 DIAGNOSIS — N184 Chronic kidney disease, stage 4 (severe): Secondary | ICD-10-CM

## 2020-06-29 ENCOUNTER — Telehealth: Payer: Self-pay | Admitting: Cardiovascular Disease

## 2020-06-29 NOTE — Telephone Encounter (Signed)
Medication list updated.  Appointment scheduled with Dr. Acie Fredrickson in November. The patient was grateful for assistance.

## 2020-06-29 NOTE — Addendum Note (Signed)
Addended by: Harland German A on: 06/29/2020 02:12 PM   Modules accepted: Orders

## 2020-06-29 NOTE — Telephone Encounter (Signed)
Patient informed me that he has stopped his eliquis due to GI bleeding . He should have a follow up appt in Nov.    Please remove eliquis from his med list    Mertie Moores, MD  06/29/2020 2:09 PM    [address],  [place], [place]  [address] Phone: [phone]; Fax: [phone]

## 2020-06-30 ENCOUNTER — Telehealth: Payer: Self-pay

## 2020-06-30 ENCOUNTER — Other Ambulatory Visit: Payer: Self-pay

## 2020-06-30 ENCOUNTER — Other Ambulatory Visit (INDEPENDENT_AMBULATORY_CARE_PROVIDER_SITE_OTHER): Payer: Medicare Other

## 2020-06-30 ENCOUNTER — Encounter (HOSPITAL_COMMUNITY)
Admission: RE | Admit: 2020-06-30 | Discharge: 2020-06-30 | Disposition: A | Discharge status: Home / Self Care | Payer: Medicare Other | Source: Ambulatory Visit | Attending: Nephrology | Admitting: Nephrology

## 2020-06-30 ENCOUNTER — Encounter: Payer: Self-pay | Admitting: Internal Medicine

## 2020-06-30 ENCOUNTER — Encounter (HOSPITAL_COMMUNITY): Payer: Medicare Other

## 2020-06-30 VITALS — BP 158/58 | HR 65 | Temp 97.4°F | Resp 20

## 2020-06-30 DIAGNOSIS — D631 Anemia in chronic kidney disease: Secondary | ICD-10-CM | POA: Insufficient documentation

## 2020-06-30 DIAGNOSIS — N184 Chronic kidney disease, stage 4 (severe): Secondary | ICD-10-CM

## 2020-06-30 DIAGNOSIS — D649 Anemia, unspecified: Secondary | ICD-10-CM

## 2020-06-30 DIAGNOSIS — C221 Intrahepatic bile duct carcinoma: Secondary | ICD-10-CM | POA: Diagnosis not present

## 2020-06-30 LAB — CBC WITH DIFFERENTIAL/PLATELET
Basophils Absolute: 0 10*3/uL (ref 0.0–0.1)
Basophils Relative: 0.7 % (ref 0.0–3.0)
Eosinophils Absolute: 0.4 10*3/uL (ref 0.0–0.7)
Eosinophils Relative: 5.7 % — ABNORMAL HIGH (ref 0.0–5.0)
HCT: 23.1 % — CL (ref 39.0–52.0)
Hemoglobin: 7.5 g/dL — CL (ref 13.0–17.0)
Lymphocytes Relative: 11.5 % — ABNORMAL LOW (ref 12.0–46.0)
Lymphs Abs: 0.8 10*3/uL (ref 0.7–4.0)
MCHC: 32.5 g/dL (ref 30.0–36.0)
MCV: 97 fl (ref 78.0–100.0)
Monocytes Absolute: 0.9 10*3/uL (ref 0.1–1.0)
Monocytes Relative: 12.4 % — ABNORMAL HIGH (ref 3.0–12.0)
Neutro Abs: 4.8 10*3/uL (ref 1.4–7.7)
Neutrophils Relative %: 69.7 % (ref 43.0–77.0)
Platelets: 137 10*3/uL — ABNORMAL LOW (ref 150.0–400.0)
RBC: 2.38 Mil/uL — ABNORMAL LOW (ref 4.22–5.81)
RDW: 16.4 % — ABNORMAL HIGH (ref 11.5–15.5)
WBC: 6.9 10*3/uL (ref 4.0–10.5)

## 2020-06-30 LAB — COMPREHENSIVE METABOLIC PANEL
ALT: 63 U/L — ABNORMAL HIGH (ref 0–53)
AST: 59 U/L — ABNORMAL HIGH (ref 0–37)
Albumin: 3.4 g/dL — ABNORMAL LOW (ref 3.5–5.2)
Alkaline Phosphatase: 251 U/L — ABNORMAL HIGH (ref 39–117)
BUN: 46 mg/dL — ABNORMAL HIGH (ref 6–23)
CO2: 20 mEq/L (ref 19–32)
Calcium: 8.6 mg/dL (ref 8.4–10.5)
Chloride: 100 mEq/L (ref 96–112)
Creatinine, Ser: 3.49 mg/dL — ABNORMAL HIGH (ref 0.40–1.50)
GFR: 16.49 mL/min — ABNORMAL LOW (ref >60.00)
Glucose, Bld: 199 mg/dL — ABNORMAL HIGH (ref 70–99)
Potassium: 4.8 mEq/L (ref 3.5–5.1)
Sodium: 126 mEq/L — ABNORMAL LOW (ref 135–145)
Total Bilirubin: 0.9 mg/dL (ref 0.2–1.2)
Total Protein: 7.1 g/dL (ref 6.0–8.3)

## 2020-06-30 LAB — POCT HEMOGLOBIN-HEMACUE: Hemoglobin: 7.6 g/dL — ABNORMAL LOW (ref 13.0–17.0)

## 2020-06-30 MED ORDER — EPOETIN ALFA 10000 UNIT/ML IJ SOLN
INTRAMUSCULAR | Status: AC
  Administered 2020-06-30: 10000 [IU] via SUBCUTANEOUS
  Filled 2020-06-30: qty 1

## 2020-06-30 MED ORDER — EPOETIN ALFA 10000 UNIT/ML IJ SOLN: 30000.0000 [IU] | INTRAMUSCULAR | Status: DC

## 2020-06-30 MED ORDER — EPOETIN ALFA 20000 UNIT/ML IJ SOLN
INTRAMUSCULAR | Status: AC
  Administered 2020-06-30: 20000 [IU] via SUBCUTANEOUS
  Filled 2020-06-30: qty 1

## 2020-06-30 NOTE — Telephone Encounter (Signed)
mychart message sent to daughter.

## 2020-07-06 ENCOUNTER — Encounter: Payer: Self-pay | Admitting: Family

## 2020-07-07 ENCOUNTER — Encounter (HOSPITAL_COMMUNITY): Payer: Medicare Other

## 2020-07-07 ENCOUNTER — Inpatient Hospital Stay (HOSPITAL_COMMUNITY): Admission: RE | Admit: 2020-07-07 | Payer: Medicare Other | Source: Ambulatory Visit

## 2020-07-09 ENCOUNTER — Other Ambulatory Visit (HOSPITAL_COMMUNITY): Payer: Self-pay | Admitting: *Deleted

## 2020-07-10 ENCOUNTER — Encounter: Payer: Self-pay | Admitting: Family

## 2020-07-10 ENCOUNTER — Inpatient Hospital Stay (HOSPITAL_COMMUNITY): Admission: RE | Admit: 2020-07-10 | Payer: Medicare Other | Source: Ambulatory Visit

## 2020-07-10 ENCOUNTER — Other Ambulatory Visit: Payer: Self-pay | Admitting: Family

## 2020-07-10 DIAGNOSIS — C221 Intrahepatic bile duct carcinoma: Secondary | ICD-10-CM

## 2020-07-13 ENCOUNTER — Other Ambulatory Visit (INDEPENDENT_AMBULATORY_CARE_PROVIDER_SITE_OTHER): Payer: Medicare Other

## 2020-07-13 DIAGNOSIS — C221 Intrahepatic bile duct carcinoma: Secondary | ICD-10-CM

## 2020-07-13 LAB — COMPREHENSIVE METABOLIC PANEL
ALT: 35 U/L (ref 0–53)
AST: 36 U/L (ref 0–37)
Albumin: 3 g/dL — ABNORMAL LOW (ref 3.5–5.2)
Alkaline Phosphatase: 265 U/L — ABNORMAL HIGH (ref 39–117)
BUN: 50 mg/dL — ABNORMAL HIGH (ref 6–23)
CO2: 17 mEq/L — ABNORMAL LOW (ref 19–32)
Calcium: 8.2 mg/dL — ABNORMAL LOW (ref 8.4–10.5)
Chloride: 102 mEq/L (ref 96–112)
Creatinine, Ser: 3.78 mg/dL — ABNORMAL HIGH (ref 0.40–1.50)
GFR: 15.04 mL/min — ABNORMAL LOW (ref >60.00)
Glucose, Bld: 177 mg/dL — ABNORMAL HIGH (ref 70–99)
Potassium: 5 mEq/L (ref 3.5–5.1)
Sodium: 129 mEq/L — ABNORMAL LOW (ref 135–145)
Total Bilirubin: 2.5 mg/dL — ABNORMAL HIGH (ref 0.2–1.2)
Total Protein: 6.4 g/dL (ref 6.0–8.3)

## 2020-07-13 LAB — CBC
HCT: 28.4 % — ABNORMAL LOW (ref 39.0–52.0)
Hemoglobin: 9.3 g/dL — ABNORMAL LOW (ref 13.0–17.0)
MCHC: 32.8 g/dL (ref 30.0–36.0)
MCV: 93.6 fl (ref 78.0–100.0)
Platelets: 139 10*3/uL — ABNORMAL LOW (ref 150.0–400.0)
RBC: 3.04 Mil/uL — ABNORMAL LOW (ref 4.22–5.81)
RDW: 15.9 % — ABNORMAL HIGH (ref 11.5–15.5)
WBC: 7 10*3/uL (ref 4.0–10.5)

## 2020-07-13 NOTE — Addendum Note (Signed)
Addended by: Jacob Moores on: 07/13/2020 10:15 AM   Modules accepted: Orders

## 2020-07-14 ENCOUNTER — Encounter (HOSPITAL_COMMUNITY): Payer: Medicare Other

## 2020-07-16 ENCOUNTER — Other Ambulatory Visit: Payer: Self-pay

## 2020-07-16 ENCOUNTER — Ambulatory Visit (HOSPITAL_COMMUNITY)
Admission: RE | Admit: 2020-07-16 | Discharge: 2020-07-16 | Disposition: A | Discharge status: Home / Self Care | Payer: Medicare Other | Source: Ambulatory Visit | Attending: Nephrology | Admitting: Nephrology

## 2020-07-16 ENCOUNTER — Encounter: Payer: Self-pay | Admitting: Family

## 2020-07-16 VITALS — BP 142/60 | HR 75 | Temp 98.2°F | Resp 20

## 2020-07-16 DIAGNOSIS — D631 Anemia in chronic kidney disease: Secondary | ICD-10-CM | POA: Insufficient documentation

## 2020-07-16 DIAGNOSIS — N184 Chronic kidney disease, stage 4 (severe): Secondary | ICD-10-CM | POA: Insufficient documentation

## 2020-07-16 DIAGNOSIS — D649 Anemia, unspecified: Secondary | ICD-10-CM

## 2020-07-16 LAB — IRON AND TIBC
Iron: 66 ug/dL (ref 45–182)
Saturation Ratios: 24 % (ref 17.9–39.5)
TIBC: 272 ug/dL (ref 250–450)
UIBC: 206 ug/dL

## 2020-07-16 LAB — FERRITIN: Ferritin: 115 ng/mL (ref 24–336)

## 2020-07-16 LAB — POCT HEMOGLOBIN-HEMACUE: Hemoglobin: 9.1 g/dL — ABNORMAL LOW (ref 13.0–17.0)

## 2020-07-16 MED ORDER — EPOETIN ALFA 20000 UNIT/ML IJ SOLN
INTRAMUSCULAR | Status: AC
  Administered 2020-07-16: 20000 [IU] via SUBCUTANEOUS
  Filled 2020-07-16: qty 1

## 2020-07-16 MED ORDER — EPOETIN ALFA 10000 UNIT/ML IJ SOLN: 30000.0000 [IU] | INTRAMUSCULAR | Status: DC

## 2020-07-16 MED ORDER — EPOETIN ALFA 10000 UNIT/ML IJ SOLN
INTRAMUSCULAR | Status: AC
  Administered 2020-07-16: 10000 [IU] via SUBCUTANEOUS
  Filled 2020-07-16: qty 1

## 2020-07-17 ENCOUNTER — Encounter (HOSPITAL_COMMUNITY): Payer: Medicare Other

## 2020-07-20 ENCOUNTER — Other Ambulatory Visit: Payer: Self-pay | Admitting: Family

## 2020-07-20 ENCOUNTER — Encounter: Payer: Self-pay | Admitting: Family

## 2020-07-20 DIAGNOSIS — C221 Intrahepatic bile duct carcinoma: Secondary | ICD-10-CM

## 2020-07-20 NOTE — Telephone Encounter (Signed)
-----   Message from Elmarie Shiley, MD sent at 07/17/2020 11:45 AM EDT ----- Regarding: RE: Gerald Jacobs, He was seen yesterday by our extender and appears to have been doing better- the main questions were diuretics (which we addressed as follows lasix 40mg  x 2 days and then 20mg  daily until back to his dry weight) and anemia management (he will be plugged back into short stay for procrit and iron with labs).  I agree that palliative care consultation for assistance with management will be beneficial. I think [name] would be open to that after his recent setbacks (with the reassurance that we will keep seeing him and continue his procrit). If he is under hospice care.. then, he will need to stop procrit.  Thank you again for all you do. ----- Message ----- From: Marrian Salvage, FNP Sent: 07/17/2020  11:37 AM EDT To: Elmarie Shiley, MD  Dr. Posey Pronto,  I have not seen Mr. Bollen since he got out of the hospital last week. [name] has sent a message asking for guidance about his care.  I am thinking it sounds like it is time to discuss hospice but I don't think she is going to be open to that. Before I talk to her, I was wondering if you could give me a quick summary about your visit this week/ recommendations that you made?  Thanks, Jodi Mourning, FNP

## 2020-07-21 ENCOUNTER — Other Ambulatory Visit: Payer: Self-pay | Admitting: Family

## 2020-07-21 MED ORDER — ONDANSETRON HCL 8 MG PO TABS: 8.0000 mg | ORAL_TABLET | Freq: Three times a day (TID) | ORAL | 1 refills | Status: AC | PRN

## 2020-07-22 ENCOUNTER — Other Ambulatory Visit (INDEPENDENT_AMBULATORY_CARE_PROVIDER_SITE_OTHER): Payer: Medicare Other

## 2020-07-22 ENCOUNTER — Other Ambulatory Visit: Payer: Self-pay | Admitting: Family

## 2020-07-22 ENCOUNTER — Telehealth: Payer: Self-pay

## 2020-07-22 DIAGNOSIS — N184 Chronic kidney disease, stage 4 (severe): Secondary | ICD-10-CM

## 2020-07-22 DIAGNOSIS — C221 Intrahepatic bile duct carcinoma: Secondary | ICD-10-CM | POA: Diagnosis not present

## 2020-07-22 LAB — COMPREHENSIVE METABOLIC PANEL
ALT: 35 U/L (ref 0–53)
AST: 33 U/L (ref 0–37)
Albumin: 3 g/dL — ABNORMAL LOW (ref 3.5–5.2)
Alkaline Phosphatase: 371 U/L — ABNORMAL HIGH (ref 39–117)
BUN: 43 mg/dL — ABNORMAL HIGH (ref 6–23)
CO2: 22 mEq/L (ref 19–32)
Calcium: 8.2 mg/dL — ABNORMAL LOW (ref 8.4–10.5)
Chloride: 98 mEq/L (ref 96–112)
Creatinine, Ser: 3.8 mg/dL — ABNORMAL HIGH (ref 0.40–1.50)
GFR: 14.95 mL/min — CL (ref >60.00)
Glucose, Bld: 176 mg/dL — ABNORMAL HIGH (ref 70–99)
Potassium: 5.1 mEq/L (ref 3.5–5.1)
Sodium: 128 mEq/L — ABNORMAL LOW (ref 135–145)
Total Bilirubin: 1.4 mg/dL — ABNORMAL HIGH (ref 0.2–1.2)
Total Protein: 6.5 g/dL (ref 6.0–8.3)

## 2020-07-22 LAB — CBC WITH DIFFERENTIAL/PLATELET
Basophils Absolute: 0.1 10*3/uL (ref 0.0–0.1)
Basophils Relative: 0.8 % (ref 0.0–3.0)
Eosinophils Absolute: 0.4 10*3/uL (ref 0.0–0.7)
Eosinophils Relative: 5.7 % — ABNORMAL HIGH (ref 0.0–5.0)
HCT: 27 % — ABNORMAL LOW (ref 39.0–52.0)
Hemoglobin: 9 g/dL — ABNORMAL LOW (ref 13.0–17.0)
Lymphocytes Relative: 8 % — ABNORMAL LOW (ref 12.0–46.0)
Lymphs Abs: 0.6 10*3/uL — ABNORMAL LOW (ref 0.7–4.0)
MCHC: 33.3 g/dL (ref 30.0–36.0)
MCV: 93.4 fl (ref 78.0–100.0)
Monocytes Absolute: 0.6 10*3/uL (ref 0.1–1.0)
Monocytes Relative: 8.4 % (ref 3.0–12.0)
Neutro Abs: 5.4 10*3/uL (ref 1.4–7.7)
Neutrophils Relative %: 77.1 % — ABNORMAL HIGH (ref 43.0–77.0)
Platelets: 170 10*3/uL (ref 150.0–400.0)
RBC: 2.89 Mil/uL — ABNORMAL LOW (ref 4.22–5.81)
RDW: 15.6 % — ABNORMAL HIGH (ref 11.5–15.5)
WBC: 7 10*3/uL (ref 4.0–10.5)

## 2020-07-22 NOTE — Telephone Encounter (Signed)
Patient's daughter is aware and in agreement with treatment; plan for repeat labs at [facility] on Friday, 8/27.

## 2020-07-22 NOTE — Telephone Encounter (Signed)
-----   Message from Elmarie Shiley, MD sent at 07/22/2020 12:57 PM EDT ----- Gerald Jacobs, Thank you for your message. It looks like the GFR is almost comparable to what it was about a week ago and I suggest not sending him to the emergency room. Please inquire if he is still taking furosemide and ask him to discontinue it and drink at least 1 L of Pedialyte daily for the next two days. Schedule him for repeat basic metabolic panel on Friday 4/00.  I am disappointed that they declined palliative care.  Elmarie Shiley    ----- Message ----- From: Marrian Salvage, FNP Sent: 07/22/2020  11:54 AM EDT To: Elmarie Shiley, MD  Dr. Posey Pronto,  [name]'s GFR is being flagged as critical by our lab today at 14.95. This is a slight decrease compared to previous readings. I don't think this is an emergency but I wanted to know what protocol your office would use. Would you want him admitted with numbers like that?   Thank you and FYI they opted against palliative care at this time.  Jodi Mourning, FNP

## 2020-07-22 NOTE — Progress Notes (Signed)
Spoke with patient's kidney doctor; treatment plan formulated and communicated to daughter; re-check in 2 days.

## 2020-07-22 NOTE — Addendum Note (Signed)
Addended by: Trenda Moots on: 4/32/0037 10:04 AM   Modules accepted: Orders

## 2020-07-23 ENCOUNTER — Ambulatory Visit (HOSPITAL_COMMUNITY)
Admission: RE | Admit: 2020-07-23 | Discharge: 2020-07-23 | Disposition: A | Discharge status: Home / Self Care | Payer: Medicare Other | Source: Ambulatory Visit | Attending: Nephrology | Admitting: Nephrology

## 2020-07-23 ENCOUNTER — Other Ambulatory Visit: Payer: Self-pay

## 2020-07-23 VITALS — BP 145/59 | HR 66 | Temp 97.3°F | Resp 20

## 2020-07-23 DIAGNOSIS — D649 Anemia, unspecified: Secondary | ICD-10-CM

## 2020-07-23 DIAGNOSIS — N184 Chronic kidney disease, stage 4 (severe): Secondary | ICD-10-CM | POA: Diagnosis present

## 2020-07-23 DIAGNOSIS — D631 Anemia in chronic kidney disease: Secondary | ICD-10-CM | POA: Insufficient documentation

## 2020-07-23 LAB — POCT HEMOGLOBIN-HEMACUE: Hemoglobin: 9 g/dL — ABNORMAL LOW (ref 13.0–17.0)

## 2020-07-23 MED ORDER — EPOETIN ALFA 10000 UNIT/ML IJ SOLN: 30000.0000 [IU] | INTRAMUSCULAR | Status: DC

## 2020-07-23 MED ORDER — SODIUM CHLORIDE 0.9 % IV SOLN
510.0000 mg | INTRAVENOUS | Status: DC
  Administered 2020-07-23: 510 mg via INTRAVENOUS
  Filled 2020-07-23: qty 17

## 2020-07-23 MED ORDER — EPOETIN ALFA 40000 UNIT/ML IJ SOLN
INTRAMUSCULAR | Status: AC
  Administered 2020-07-23: 30000 [IU] via SUBCUTANEOUS
  Filled 2020-07-23: qty 1

## 2020-07-24 ENCOUNTER — Encounter: Payer: Self-pay | Admitting: Family

## 2020-07-24 ENCOUNTER — Other Ambulatory Visit: Payer: Self-pay | Admitting: Family

## 2020-07-24 ENCOUNTER — Telehealth: Payer: Self-pay

## 2020-07-24 ENCOUNTER — Other Ambulatory Visit (INDEPENDENT_AMBULATORY_CARE_PROVIDER_SITE_OTHER): Payer: Medicare Other

## 2020-07-24 DIAGNOSIS — N184 Chronic kidney disease, stage 4 (severe): Secondary | ICD-10-CM

## 2020-07-24 DIAGNOSIS — C221 Intrahepatic bile duct carcinoma: Secondary | ICD-10-CM | POA: Diagnosis not present

## 2020-07-24 LAB — CBC WITH DIFFERENTIAL/PLATELET
Basophils Absolute: 0.1 10*3/uL (ref 0.0–0.1)
Basophils Relative: 1 % (ref 0.0–3.0)
Eosinophils Absolute: 0.4 10*3/uL (ref 0.0–0.7)
Eosinophils Relative: 7.6 % — ABNORMAL HIGH (ref 0.0–5.0)
HCT: 27.8 % — ABNORMAL LOW (ref 39.0–52.0)
Hemoglobin: 9.2 g/dL — ABNORMAL LOW (ref 13.0–17.0)
Lymphocytes Relative: 9.7 % — ABNORMAL LOW (ref 12.0–46.0)
Lymphs Abs: 0.5 10*3/uL — ABNORMAL LOW (ref 0.7–4.0)
MCHC: 33.1 g/dL (ref 30.0–36.0)
MCV: 93.4 fl (ref 78.0–100.0)
Monocytes Absolute: 0.6 10*3/uL (ref 0.1–1.0)
Monocytes Relative: 11 % (ref 3.0–12.0)
Neutro Abs: 3.9 10*3/uL (ref 1.4–7.7)
Neutrophils Relative %: 70.7 % (ref 43.0–77.0)
Platelets: 167 10*3/uL (ref 150.0–400.0)
RBC: 2.97 Mil/uL — ABNORMAL LOW (ref 4.22–5.81)
RDW: 15.4 % (ref 11.5–15.5)
WBC: 5.5 10*3/uL (ref 4.0–10.5)

## 2020-07-24 LAB — COMPREHENSIVE METABOLIC PANEL
ALT: 30 U/L (ref 0–53)
AST: 30 U/L (ref 0–37)
Albumin: 3.1 g/dL — ABNORMAL LOW (ref 3.5–5.2)
Alkaline Phosphatase: 372 U/L — ABNORMAL HIGH (ref 39–117)
BUN: 46 mg/dL — ABNORMAL HIGH (ref 6–23)
CO2: 23 mEq/L (ref 19–32)
Calcium: 8.4 mg/dL (ref 8.4–10.5)
Chloride: 100 mEq/L (ref 96–112)
Creatinine, Ser: 3.82 mg/dL — ABNORMAL HIGH (ref 0.40–1.50)
GFR: 14.86 mL/min — CL (ref >60.00)
Glucose, Bld: 178 mg/dL — ABNORMAL HIGH (ref 70–99)
Potassium: 5.5 mEq/L — ABNORMAL HIGH (ref 3.5–5.1)
Sodium: 132 mEq/L — ABNORMAL LOW (ref 135–145)
Total Bilirubin: 1.3 mg/dL — ABNORMAL HIGH (ref 0.2–1.2)
Total Protein: 6.6 g/dL (ref 6.0–8.3)

## 2020-07-24 MED ORDER — LOKELMA 10 G PO PACK: 10.0000 g | PACK | Freq: Every day | ORAL | 0 refills | Status: AC

## 2020-07-24 NOTE — Progress Notes (Signed)
Patient's nephrologist has been notified and asked that 2 days of Lokelma be called in for patient; patient's daughter is aware; patient will plan to see his nephrologist next week.

## 2020-07-24 NOTE — Addendum Note (Signed)
Addended by: Trenda Moots on: 5/73/3448 09:31 AM   Modules accepted: Orders

## 2020-07-24 NOTE — Telephone Encounter (Signed)
-----   Message from Elmarie Shiley, MD sent at 07/24/2020 11:52 AM EDT ----- Regarding: RE: Would you kindly send in an Rx for veltassa 8.4gm or lokelma 10gm daily x 2 doses for his K ----- Message ----- From: Marrian Salvage, FNP Sent: 07/24/2020  11:47 AM EDT To: Elmarie Shiley, MD  Dr. Posey Pronto,  Just checking in before the weekend about Mr. Schachter; GFR has dropped slightly- down to 14.8 today from 14.95 2 days ago; K+ was at 5.5 but no other acute electrolyte changes.  I am not sure when is due to actually see you again?  Thanks, [name]

## 2020-07-27 ENCOUNTER — Encounter: Payer: Self-pay | Admitting: Family

## 2020-07-27 NOTE — Telephone Encounter (Signed)
Reviewed by [name] and addressed.

## 2020-07-27 NOTE — Telephone Encounter (Signed)
Entered in error

## 2020-07-29 ENCOUNTER — Encounter (HOSPITAL_COMMUNITY)
Admission: RE | Admit: 2020-07-29 | Discharge: 2020-07-29 | Disposition: A | Discharge status: Home / Self Care | Payer: Medicare Other | Source: Ambulatory Visit | Attending: Nephrology | Admitting: Nephrology

## 2020-07-29 ENCOUNTER — Other Ambulatory Visit: Payer: Self-pay

## 2020-07-29 DIAGNOSIS — D631 Anemia in chronic kidney disease: Secondary | ICD-10-CM | POA: Diagnosis not present

## 2020-07-29 DIAGNOSIS — N184 Chronic kidney disease, stage 4 (severe): Secondary | ICD-10-CM | POA: Insufficient documentation

## 2020-07-29 DIAGNOSIS — D649 Anemia, unspecified: Secondary | ICD-10-CM

## 2020-07-29 MED ORDER — EPOETIN ALFA 10000 UNIT/ML IJ SOLN
INTRAMUSCULAR | Status: AC
  Administered 2020-07-29: 10000 [IU]
  Filled 2020-07-29: qty 1

## 2020-07-29 MED ORDER — EPOETIN ALFA 10000 UNIT/ML IJ SOLN: 30000.0000 [IU] | INTRAMUSCULAR | Status: DC

## 2020-07-29 MED ORDER — EPOETIN ALFA 20000 UNIT/ML IJ SOLN
INTRAMUSCULAR | Status: AC
  Administered 2020-07-29: 20000 [IU]
  Filled 2020-07-29: qty 1

## 2020-07-30 ENCOUNTER — Encounter (HOSPITAL_COMMUNITY): Payer: Medicare Other

## 2020-07-30 LAB — POCT HEMOGLOBIN-HEMACUE: Hemoglobin: 9.6 g/dL — ABNORMAL LOW (ref 13.0–17.0)

## 2020-08-03 ENCOUNTER — Emergency Department (HOSPITAL_COMMUNITY): Payer: Medicare Other

## 2020-08-03 ENCOUNTER — Encounter (HOSPITAL_COMMUNITY): Payer: Self-pay

## 2020-08-03 ENCOUNTER — Inpatient Hospital Stay (HOSPITAL_COMMUNITY)
Admission: EM | Admit: 2020-08-03 | Discharge: 2020-08-04 | DRG: 864 | Disposition: A | Discharge status: Other Acute Inpatient Hospital | Payer: Medicare Other | Attending: Internal Medicine | Admitting: Internal Medicine

## 2020-08-03 DIAGNOSIS — R509 Fever, unspecified: Secondary | ICD-10-CM | POA: Diagnosis not present

## 2020-08-03 DIAGNOSIS — Z823 Family history of stroke: Secondary | ICD-10-CM

## 2020-08-03 DIAGNOSIS — N179 Acute kidney failure, unspecified: Secondary | ICD-10-CM | POA: Diagnosis present

## 2020-08-03 DIAGNOSIS — Z8546 Personal history of malignant neoplasm of prostate: Secondary | ICD-10-CM

## 2020-08-03 DIAGNOSIS — Z888 Allergy status to other drugs, medicaments and biological substances status: Secondary | ICD-10-CM

## 2020-08-03 DIAGNOSIS — E785 Hyperlipidemia, unspecified: Secondary | ICD-10-CM | POA: Diagnosis present

## 2020-08-03 DIAGNOSIS — Z87891 Personal history of nicotine dependence: Secondary | ICD-10-CM

## 2020-08-03 DIAGNOSIS — E860 Dehydration: Secondary | ICD-10-CM

## 2020-08-03 DIAGNOSIS — I2510 Atherosclerotic heart disease of native coronary artery without angina pectoris: Secondary | ICD-10-CM | POA: Diagnosis present

## 2020-08-03 DIAGNOSIS — Z923 Personal history of irradiation: Secondary | ICD-10-CM

## 2020-08-03 DIAGNOSIS — Z885 Allergy status to narcotic agent status: Secondary | ICD-10-CM

## 2020-08-03 DIAGNOSIS — R6510 Systemic inflammatory response syndrome (SIRS) of non-infectious origin without acute organ dysfunction: Secondary | ICD-10-CM | POA: Diagnosis not present

## 2020-08-03 DIAGNOSIS — E119 Type 2 diabetes mellitus without complications: Secondary | ICD-10-CM | POA: Diagnosis present

## 2020-08-03 DIAGNOSIS — K219 Gastro-esophageal reflux disease without esophagitis: Secondary | ICD-10-CM | POA: Diagnosis present

## 2020-08-03 DIAGNOSIS — I4891 Unspecified atrial fibrillation: Secondary | ICD-10-CM | POA: Diagnosis present

## 2020-08-03 DIAGNOSIS — Z79899 Other long term (current) drug therapy: Secondary | ICD-10-CM

## 2020-08-03 DIAGNOSIS — I129 Hypertensive chronic kidney disease with stage 1 through stage 4 chronic kidney disease, or unspecified chronic kidney disease: Secondary | ICD-10-CM | POA: Diagnosis present

## 2020-08-03 DIAGNOSIS — Z20822 Contact with and (suspected) exposure to covid-19: Secondary | ICD-10-CM | POA: Diagnosis present

## 2020-08-03 DIAGNOSIS — Z9049 Acquired absence of other specified parts of digestive tract: Secondary | ICD-10-CM

## 2020-08-03 DIAGNOSIS — R188 Other ascites: Secondary | ICD-10-CM | POA: Diagnosis present

## 2020-08-03 DIAGNOSIS — E871 Hypo-osmolality and hyponatremia: Secondary | ICD-10-CM | POA: Diagnosis present

## 2020-08-03 DIAGNOSIS — N184 Chronic kidney disease, stage 4 (severe): Secondary | ICD-10-CM | POA: Diagnosis present

## 2020-08-03 DIAGNOSIS — Z8505 Personal history of malignant neoplasm of liver: Secondary | ICD-10-CM

## 2020-08-03 DIAGNOSIS — C220 Liver cell carcinoma: Secondary | ICD-10-CM | POA: Diagnosis present

## 2020-08-03 DIAGNOSIS — Z880 Allergy status to penicillin: Secondary | ICD-10-CM

## 2020-08-03 MED ORDER — SODIUM CHLORIDE 0.9 % IV BOLUS
500.0000 mL | Freq: Once | INTRAVENOUS | Status: AC
  Administered 2020-08-04: 500 mL via INTRAVENOUS

## 2020-08-03 MED ORDER — IOHEXOL 9 MG/ML PO SOLN: 500.0000 mL | ORAL | Status: AC

## 2020-08-03 MED ORDER — IOHEXOL 9 MG/ML PO SOLN
ORAL | Status: AC
  Filled 2020-08-03: qty 1000

## 2020-08-03 MED ORDER — FENTANYL CITRATE (PF) 100 MCG/2ML IJ SOLN
50.0000 ug | Freq: Once | INTRAMUSCULAR | Status: AC
  Administered 2020-08-04: 50 ug via INTRAVENOUS
  Filled 2020-08-03: qty 2

## 2020-08-03 MED ORDER — ONDANSETRON HCL 4 MG/2ML IJ SOLN
4.0000 mg | Freq: Once | INTRAMUSCULAR | Status: AC
  Administered 2020-08-04: 4 mg via INTRAVENOUS
  Filled 2020-08-03: qty 2

## 2020-08-03 NOTE — ED Triage Notes (Signed)
Assume care from ems, Per EMS reports pt have abd pain & nausea & no appetite  since last Friday and was trying to see his PCP today.   Ems Vitals  136/74 Pulse 88 CBG 187

## 2020-08-03 NOTE — ED Provider Notes (Addendum)
[facility] Provider Note   CSN: [id number] Arrival date & time: 08/03/20  2254    History Chief Complaint  Patient presents with  . Abdominal Pain    Gerald Jacobs is a 86 y.o. male with a history of intrahepatic cholangiocarcinoma S/p cholecystectomy, stent placement, and prior radiation, CAD, hypertension, CKD, & afib who presents to the ED with complaints of abdominal discomfort x 3 days. Patient states that he woke from sleep 09/04 @ 0200 AM with nausea and dry heaves with associated abdominal discomfort. Abdominal discomfort is generalized, feels like a constant soreness, and has intermittent worsening episodes of pain. No alleviating/aggravating factors. The subsequent day he developed diarrhea, non bloody, multiple episodes. Has some night sweats the past 2 nights as well as chills. Denies fevers, vomiting, chest pain, dyspnea, dysuria, melena, hematochezia, or syncope.   HPI     Past Medical History:  Diagnosis Date  . Bilateral shoulder pain 07/28/2016  . Cancer [facility])    prostate  . Chest pain 10/27/2016  . Chronic anemia    With normal iron studies and normal serum protein electrophoresis  . Coronary artery disease    Mild  . Cough 10/27/2016  . Diabetes mellitus    Adult onset  . Essential hypertension   . GERD (gastroesophageal reflux disease)    occ tums  . History of gout   . Hyperbilirubinemia 06/15/2017  . Hyponatremia 06/15/2017  . Intermittent palpitations 07/23/2014  . Liver mass   . Mass of bile duct 06/27/2017  . Mild renal insufficiency   . Nocturia    x2  . Normocytic anemia 06/23/2016  . Osteoarthritis   . Sciatica of right side 07/28/2016  . Statin myopathy 07/28/2016  . Weight loss 06/15/2017    Patient Active Problem List   Diagnosis Date Noted  . Acute left-sided low back pain without sciatica 03/05/2020  . Closed wedge compression fracture of second lumbar vertebra ([place]) 03/05/2020  . Atrial  fibrillation ([place]) 10/04/2019  . Intrahepatic cholangiocarcinoma ([place]) 06/27/2017  . Essential hypertension 09/02/2016  . Statin myopathy 07/28/2016  . Normocytic anemia 06/23/2016  . Medicare annual wellness visit, subsequent 01/18/2016  . Routine general medical examination at a health care facility 01/18/2016  . Frequent PVCs 11/24/2014  . Chronic cholecystitis with calculus 02/04/2014  . Choledocholithiasis 08/28/2013  . Symptomatic cholelithiasis 06/04/2013  . Benign hypertensive heart disease without heart failure 09/07/2011  . Type 2 diabetes mellitus with stage 4 chronic kidney disease, without long-term current use of insulin ([place]) 09/07/2011  . Dyslipidemia 09/07/2011  . CKD (chronic kidney disease), stage IV ([place]) 09/07/2011    Past Surgical History:  Procedure Laterality Date  . CHOLECYSTECTOMY  02/04/2014   DR Zella Richer  . CHOLECYSTECTOMY N/A 02/04/2014   Procedure: LAPAROSCOPIC CHOLECYSTECTOMY with intraoperative cholangiogram;  Surgeon: Odis Hollingshead, MD;  Location: [place];  Service: General;  Laterality: N/A;  . ERCP N/A 10/15/2013   Procedure: ENDOSCOPIC RETROGRADE CHOLANGIOPANCREATOGRAPHY (ERCP);  Surgeon: Jeryl Columbia, MD;  Location: [facility];  Service: Endoscopy;  Laterality: N/A;  . KNEE SURGERY Right 1979  . melanoma surgery  1970's   on scalp  . PROSTATE SURGERY  1993   Prostate Cancer  . SPHINCTEROTOMY  10/15/2013   Procedure: SPHINCTEROTOMY;  Surgeon: Jeryl Columbia, MD;  Location: [facility];  Service: Endoscopy;;       Family History  Problem Relation Age of Onset  . Stroke Father   . Stroke Mother   .  Stroke Sister   . Stroke Brother   . Stroke Brother   . Stroke Brother     Social History   Tobacco Use  . Smoking status: Former Smoker    Packs/day: 1.00    Years: 45.00    Pack years: 45.00    Types: Cigarettes    Quit date: 09/07/1979    Years since quitting: 40.9  . Smokeless tobacco: Never Used  Vaping Use  . Vaping Use:  Never used  Substance Use Topics  . Alcohol use: Yes    Comment: beer rarely  . Drug use: No    Home Medications Prior to Admission medications   Medication Sig Start Date End Date Taking? Authorizing Provider  allopurinol (ZYLOPRIM) 100 MG tablet TAKE 1 TABLET BY MOUTH EVERY DAY 03/27/20   Marrian Salvage, FNP  amLODipine (NORVASC) 2.5 MG tablet Take 1 tablet (2.5 mg total) by mouth daily. 05/26/20   Nahser, Wonda Cheng, MD  cholecalciferol (VITAMIN D) 1000 units tablet Take 1,000 Units by mouth 2 (two) times daily.     [provider]  epoetin alfa (EPOGEN,PROCRIT) 10258 UNIT/ML injection Inject 10,000 Units into the vein once a week.    [provider]  furosemide (LASIX) 20 MG tablet Take 20 mg by mouth as needed.    [provider]  glucose blood (ONE TOUCH ULTRA TEST) test strip USE ONE STRIP PER TEST. CHECK BLOOD SUGARS 1-4 TIMES PER DAY AS INSTRUCTED. 10/16/19   Marrian Salvage, FNP  JANUVIA 50 MG tablet TAKE 1 TABLET BY MOUTH EVERY DAY 06/05/20   Marrian Salvage, FNP  metoprolol succinate (TOPROL-XL) 50 MG 24 hr tablet TAKE 1 TABLET (50 MG TOTAL) BY MOUTH 2 (TWO) TIMES DAILY. TAKE WITH OR IMMEDIATELY FOLLOWING A MEAL. 02/10/20   Marrian Salvage, FNP  mupirocin ointment (BACTROBAN) 2 % Place 1 application into the nose once a week.    [provider]  Omega-3 Fatty Acids (FISH OIL) 1000 MG CAPS Take 1,000 mg by mouth at bedtime.     [provider]  ondansetron (ZOFRAN) 8 MG tablet Take 1 tablet (8 mg total) by mouth every 8 (eight) hours as needed for nausea or vomiting. 07/21/20   Marrian Salvage, FNP  OneTouch Delica Lancets 52D MISC TEST BLOOD SUGARS 1-4 TIMES A DAY AS INSTRUCTED. DX CODE: E11.9 05/29/19   Marrian Salvage, FNP  pantoprazole (PROTONIX) 40 MG tablet Take 40 mg by mouth daily as needed (indigestion).  01/26/18 04/20/20  [provider]  promethazine (PHENERGAN) 12.5 MG tablet Take 12.5  mg by mouth every 6 (six) hours as needed for nausea or vomiting.    [provider]  protein supplement shake (PREMIER PROTEIN) LIQD Take 2 oz by mouth daily as needed.     [provider]  triamcinolone cream (KENALOG) 0.1 % Apply 1 application topically as needed.    [provider]    Allergies    Amoxicillin-pot clavulanate, Colchicine, Flexeril [cyclobenzaprine hcl], Levaquin [levofloxacin in d5w], Percocet [oxycodone-acetaminophen], Zebeta, and Bisoprolol fumarate  Review of Systems   Review of Systems  Constitutional: Positive for chills. Negative for fever.  Respiratory: Negative for cough and shortness of breath.   Cardiovascular: Negative for chest pain.  Gastrointestinal: Positive for abdominal pain, diarrhea and nausea. Negative for blood in stool, constipation and vomiting.       Positive for dry heaving.   Neurological: Negative for syncope.  All other systems reviewed and are negative.  Physical Exam Updated Vital Signs BP (!) 143/53 (BP Location: Right Arm)   Pulse 89   Temp 99.1 F (37.3 C) (Oral)   Resp 16   SpO2 100%   Physical Exam Vitals and nursing note reviewed.  Constitutional:      General: He is not in acute distress.    Appearance: He is not toxic-appearing.  HENT:     Head: Normocephalic and atraumatic.  Eyes:     General:        Right eye: No discharge.        Left eye: No discharge.     Conjunctiva/sclera: Conjunctivae normal.  Cardiovascular:     Rate and Rhythm: Normal rate and regular rhythm.  Pulmonary:     Effort: Pulmonary effort is normal. No respiratory distress.     Breath sounds: Normal breath sounds. No wheezing, rhonchi or rales.  Abdominal:     General: There is distension.     Palpations: Abdomen is soft.     Tenderness: There is generalized abdominal tenderness.     Comments: Tympanic to percussion in upper abdomen.   Musculoskeletal:     Cervical back: Neck supple.  Skin:    General: Skin  is warm and dry.     Findings: No rash.  Neurological:     Mental Status: He is alert.     Comments: Clear speech.   Psychiatric:        Behavior: Behavior normal.     ED Results / Procedures / Treatments   Labs (all labs ordered are listed, but only abnormal results are displayed) Labs Reviewed  CBC WITH DIFFERENTIAL/PLATELET - Abnormal; Notable for the following components:      Result Value   RBC 3.53 (*)    Hemoglobin 10.6 (*)    HCT 33.7 (*)    RDW 16.9 (*)    Platelets 138 (*)    Lymphs Abs 0.2 (*)    All other components within normal limits  COMPREHENSIVE METABOLIC PANEL - Abnormal; Notable for the following components:   Sodium 129 (*)    CO2 16 (*)    Glucose, Bld 187 (*)    BUN 54 (*)    Creatinine, Ser 4.11 (*)    Calcium 8.4 (*)    Albumin 2.2 (*)    Alkaline Phosphatase 167 (*)    Total Bilirubin 1.8 (*)    GFR calc non Af Amer 12 (*)    GFR calc Af Amer 14 (*)    All other components within normal limits  PROTIME-INR - Abnormal; Notable for the following components:   Prothrombin Time 15.9 (*)    INR 1.3 (*)    All other components within normal limits  LACTIC ACID, PLASMA - Abnormal; Notable for the following components:   Lactic Acid, Venous 2.0 (*)    All other components within normal limits  CULTURE, BLOOD (ROUTINE X 2)  CULTURE, BLOOD (ROUTINE X 2)  SARS CORONAVIRUS 2 BY RT PCR (HOSPITAL ORDER, [facility])  LIPASE, BLOOD  URINALYSIS, ROUTINE W REFLEX MICROSCOPIC  LACTIC ACID, PLASMA  LACTIC ACID, PLASMA  TROPONIN I (HIGH SENSITIVITY)  TROPONIN I (HIGH SENSITIVITY)    EKG None  Radiology CT Abdomen Pelvis Wo Contrast  Result Date: 08/03/2020 CLINICAL DATA:  Acute abdominal pain EXAM: CT ABDOMEN AND PELVIS WITHOUT CONTRAST TECHNIQUE: Multidetector CT imaging of the abdomen and pelvis was performed following the standard protocol without IV contrast. COMPARISON:  CT abdomen pelvis 01/21/2020  FINDINGS: LOWER CHEST:  Normal. HEPATOBILIARY: There are 2 intrahepatic biliary stents in unchanged position. The more medial stent terminates near the ampulla of the bile duct. There is a small amount of perihepatic ascites. No pneumobilia. Gallbladder surgically absent. PANCREAS: Normal pancreas. No ductal dilatation or peripancreatic fluid collection. SPLEEN: Normal. ADRENALS/URINARY TRACT: The adrenal glands are normal. Large right renal sinus cyst unchanged. Mild bilateral renal atrophy. No nephroureterolithiasis. The urinary bladder is normal for degree of distention STOMACH/BOWEL: Unchanged calcification at the gastroesophageal junction. No small bowel dilatation or inflammation. No focal colonic abnormality. Appendix is not visualized. There is fluid within the right greater than paracolic gutters. VASCULAR/LYMPHATIC: There is calcific atherosclerosis of the abdominal aorta. No lymphadenopathy. REPRODUCTIVE: Status post prostatectomy. MUSCULOSKELETAL. L2 vertebral augmentation. OTHER: Intermediate volume free fluid in the pelvis. IMPRESSION: 1. No acute abnormality of the abdomen or pelvis. 2. Limited assessment of the liver parenchyma in the absence IV contrast. Unchanged position of 2 biliary stents. 3. Intermediate volume ascites. 4. Unchanged appearance of calcifications at the gastroesophageal junction. Aortic Atherosclerosis (ICD10-I70.0). Electronically Signed   By: Ulyses Jarred M.D.   On: 08/03/2020 23:55    Procedures Procedures (including critical care time)  Medications Ordered in ED Medications  iohexol (OMNIPAQUE) 9 MG/ML oral solution 500 mL (has no administration in time range)  piperacillin-tazobactam (ZOSYN) IVPB 2.25 g (2.25 g Intravenous New Bag/Given 08/04/20 0431)  sodium chloride 0.9 % bolus 500 mL (0 mLs Intravenous Stopped 08/04/20 0327)  ondansetron (ZOFRAN) injection 4 mg (4 mg Intravenous Given 08/04/20 0147)  fentaNYL (SUBLIMAZE) injection 50 mcg (50 mcg Intravenous Given 08/04/20 0148)  fentaNYL  (SUBLIMAZE) injection 50 mcg (50 mcg Intravenous Given 08/04/20 0326)  sodium chloride 0.9 % bolus 500 mL (500 mLs Intravenous New Bag/Given 08/04/20 0352)  acetaminophen (TYLENOL) tablet 650 mg (650 mg Oral Given 08/04/20 0357)    ED Course  I have reviewed the triage vital signs and the nursing notes.  Pertinent labs & imaging results that were available during my care of the patient were reviewed by me and considered in my medical decision making (see chart for details).  MOREY ANDONIAN was evaluated in Emergency Department on 08/04/2020 for the symptoms described in the history of present illness. He/she was evaluated in the context of the global COVID-19 pandemic, which necessitated consideration that the patient might be at risk for infection with the SARS-CoV-2 virus that causes COVID-19. Institutional protocols and algorithms that pertain to the evaluation of patients at risk for COVID-19 are in a state of rapid change based on information released by regulatory bodies including the CDC and federal and state organizations. These policies and algorithms were followed during the patient's care in the ED.    MDM Rules/Calculators/A&P                         Patient presents to the ED with complaints of nausea, dry heaves, diarrhea, & abdominal discomfort.  Nontoxic, vitals without significant abnormality. Abdomen is distended some, generally tender.   Additional history obtained:  Additional history obtained from patient's son @ bedside as well as nursing note/chart review. Previous records obtained and reviewed.   Lab Tests:  I Ordered, reviewed, and interpreted labs, which included:  CBC: NO significant leukocytosis/penia, neutrophils WNL. Anemia @ baseline.  CMP: worsening renal function. T bili similar to prior. Bicarb is low.  Lipase: WNL Troponin: WNL Lactic acid: Mildly elevated.   Imaging Studies ordered:  I ordered imaging  studies which included CXR & CT A/P, I independently  visualized and interpreted imaging.   CT A/P:  1. No acute abnormality of the abdomen or pelvis. 2. Limited assessment of the liver parenchyma in the absence IV contrast. Unchanged position of 2 biliary stents. 3. Intermediate volume ascites. 4. Unchanged appearance of calcifications at the gastroesophageal junction. Aortic Atherosclerosis  02:30: RE-EVAL: Nausea improved, pain at a 5/10, will give additional 50 mcg of fentanyl.  03:16: Called lab to discuss delay in CMP, lipase & troponin- currently working on this.  03:48: Rectal temp w/ fever to 100.6. Tylenol ordered as well as Zosyn. Will obtain covid 19 swab, c. Diff testing, & CXR, UA remains pending. Given ascites bedside US performed with supervising physician Dr. Wyvonnia Dusky- no area that appears appropriate for ED paracentesis for further assessment.    CXR: No focal infiltrate to suggest pneumonia.  COVID testing: Pending  Plan for admission for fever of unknown origin & dehydration with worsening renal function.  Discussed results and plan of care with patient, his son at bedside, as well as his daughter and her husband via telephone, all are in agreement with plan.  04:36: CONSULT: Discussed with hospitalist Dr. Hal Hope- accepts admission.   Following initial conversation rediscussed with Dr. Hal Hope who feels the patient would be better served at [facility] for his admission, we discussed with the transfer line, Dr. Leslie Andrea accepts patient for admission and transfer.  Updated patient and his family who are agreeable.    This is a shared visit with supervising physician Dr. Wyvonnia Dusky who has independently evaluated patient & provided guidance in evaluation/management/disposition, in agreement with care   Portions of this note were generated with Dragon dictation software. Dictation errors may occur despite best attempts at proofreading.  Final Clinical Impression(s) / ED Diagnoses Final diagnoses:  Dehydration  Fever,  unspecified fever cause    Rx / DC Orders ED Discharge Orders    None       Amaryllis Dyke, PA-C 08/04/20 0439    Amaryllis Dyke, PA-C 08/04/20 9935    Ezequiel Essex, MD 08/04/20 [phone]

## 2020-08-04 ENCOUNTER — Emergency Department (HOSPITAL_COMMUNITY): Payer: Medicare Other

## 2020-08-04 ENCOUNTER — Telehealth (HOSPITAL_BASED_OUTPATIENT_CLINIC_OR_DEPARTMENT_OTHER): Payer: Self-pay | Admitting: Emergency Medicine

## 2020-08-04 DIAGNOSIS — I129 Hypertensive chronic kidney disease with stage 1 through stage 4 chronic kidney disease, or unspecified chronic kidney disease: Secondary | ICD-10-CM | POA: Diagnosis present

## 2020-08-04 DIAGNOSIS — Z885 Allergy status to narcotic agent status: Secondary | ICD-10-CM | POA: Diagnosis not present

## 2020-08-04 DIAGNOSIS — C220 Liver cell carcinoma: Secondary | ICD-10-CM | POA: Diagnosis present

## 2020-08-04 DIAGNOSIS — R6510 Systemic inflammatory response syndrome (SIRS) of non-infectious origin without acute organ dysfunction: Secondary | ICD-10-CM | POA: Diagnosis present

## 2020-08-04 DIAGNOSIS — N179 Acute kidney failure, unspecified: Secondary | ICD-10-CM | POA: Diagnosis present

## 2020-08-04 DIAGNOSIS — E119 Type 2 diabetes mellitus without complications: Secondary | ICD-10-CM | POA: Diagnosis present

## 2020-08-04 DIAGNOSIS — Z87891 Personal history of nicotine dependence: Secondary | ICD-10-CM | POA: Diagnosis not present

## 2020-08-04 DIAGNOSIS — Z8505 Personal history of malignant neoplasm of liver: Secondary | ICD-10-CM | POA: Diagnosis not present

## 2020-08-04 DIAGNOSIS — I2510 Atherosclerotic heart disease of native coronary artery without angina pectoris: Secondary | ICD-10-CM | POA: Diagnosis present

## 2020-08-04 DIAGNOSIS — R509 Fever, unspecified: Secondary | ICD-10-CM | POA: Diagnosis present

## 2020-08-04 DIAGNOSIS — I4891 Unspecified atrial fibrillation: Secondary | ICD-10-CM | POA: Diagnosis present

## 2020-08-04 DIAGNOSIS — Z20822 Contact with and (suspected) exposure to covid-19: Secondary | ICD-10-CM | POA: Diagnosis present

## 2020-08-04 DIAGNOSIS — Z823 Family history of stroke: Secondary | ICD-10-CM | POA: Diagnosis not present

## 2020-08-04 DIAGNOSIS — K219 Gastro-esophageal reflux disease without esophagitis: Secondary | ICD-10-CM | POA: Diagnosis present

## 2020-08-04 DIAGNOSIS — Z8546 Personal history of malignant neoplasm of prostate: Secondary | ICD-10-CM | POA: Diagnosis not present

## 2020-08-04 DIAGNOSIS — Z79899 Other long term (current) drug therapy: Secondary | ICD-10-CM | POA: Diagnosis not present

## 2020-08-04 DIAGNOSIS — E785 Hyperlipidemia, unspecified: Secondary | ICD-10-CM | POA: Diagnosis present

## 2020-08-04 DIAGNOSIS — R188 Other ascites: Secondary | ICD-10-CM | POA: Diagnosis present

## 2020-08-04 DIAGNOSIS — E871 Hypo-osmolality and hyponatremia: Secondary | ICD-10-CM | POA: Diagnosis present

## 2020-08-04 DIAGNOSIS — Z888 Allergy status to other drugs, medicaments and biological substances status: Secondary | ICD-10-CM | POA: Diagnosis not present

## 2020-08-04 DIAGNOSIS — N184 Chronic kidney disease, stage 4 (severe): Secondary | ICD-10-CM | POA: Diagnosis present

## 2020-08-04 DIAGNOSIS — E860 Dehydration: Secondary | ICD-10-CM | POA: Diagnosis present

## 2020-08-04 DIAGNOSIS — Z9049 Acquired absence of other specified parts of digestive tract: Secondary | ICD-10-CM | POA: Diagnosis not present

## 2020-08-04 DIAGNOSIS — Z880 Allergy status to penicillin: Secondary | ICD-10-CM | POA: Diagnosis not present

## 2020-08-04 DIAGNOSIS — Z923 Personal history of irradiation: Secondary | ICD-10-CM | POA: Diagnosis not present

## 2020-08-04 LAB — TROPONIN I (HIGH SENSITIVITY)
Troponin I (High Sensitivity): 14 ng/L (ref <18)
Troponin I (High Sensitivity): 14 ng/L (ref <18)

## 2020-08-04 LAB — COMPREHENSIVE METABOLIC PANEL
ALT: 14 U/L (ref 0–44)
AST: 18 U/L (ref 15–41)
Albumin: 2.2 g/dL — ABNORMAL LOW (ref 3.5–5.0)
Alkaline Phosphatase: 167 U/L — ABNORMAL HIGH (ref 38–126)
Anion gap: 11 (ref 5–15)
BUN: 54 mg/dL — ABNORMAL HIGH (ref 8–23)
CO2: 16 mmol/L — ABNORMAL LOW (ref 22–32)
Calcium: 8.4 mg/dL — ABNORMAL LOW (ref 8.9–10.3)
Chloride: 102 mmol/L (ref 98–111)
Creatinine, Ser: 4.11 mg/dL — ABNORMAL HIGH (ref 0.61–1.24)
GFR calc Af Amer: 14 mL/min — ABNORMAL LOW (ref >60)
GFR calc non Af Amer: 12 mL/min — ABNORMAL LOW (ref >60)
Glucose, Bld: 187 mg/dL — ABNORMAL HIGH (ref 70–99)
Potassium: 4.9 mmol/L (ref 3.5–5.1)
Sodium: 129 mmol/L — ABNORMAL LOW (ref 135–145)
Total Bilirubin: 1.8 mg/dL — ABNORMAL HIGH (ref 0.3–1.2)
Total Protein: 6.5 g/dL (ref 6.5–8.1)

## 2020-08-04 LAB — CBC WITH DIFFERENTIAL/PLATELET
Abs Immature Granulocytes: 0.02 10*3/uL (ref 0.00–0.07)
Basophils Absolute: 0 10*3/uL (ref 0.0–0.1)
Basophils Relative: 1 %
Eosinophils Absolute: 0 10*3/uL (ref 0.0–0.5)
Eosinophils Relative: 0 %
HCT: 33.7 % — ABNORMAL LOW (ref 39.0–52.0)
Hemoglobin: 10.6 g/dL — ABNORMAL LOW (ref 13.0–17.0)
Immature Granulocytes: 1 %
Lymphocytes Relative: 6 %
Lymphs Abs: 0.2 10*3/uL — ABNORMAL LOW (ref 0.7–4.0)
MCH: 30 pg (ref 26.0–34.0)
MCHC: 31.5 g/dL (ref 30.0–36.0)
MCV: 95.5 fL (ref 80.0–100.0)
Monocytes Absolute: 0.2 10*3/uL (ref 0.1–1.0)
Monocytes Relative: 4 %
Neutro Abs: 3.8 10*3/uL (ref 1.7–7.7)
Neutrophils Relative %: 88 %
Platelets: 138 10*3/uL — ABNORMAL LOW (ref 150–400)
RBC: 3.53 MIL/uL — ABNORMAL LOW (ref 4.22–5.81)
RDW: 16.9 % — ABNORMAL HIGH (ref 11.5–15.5)
WBC: 4.2 10*3/uL (ref 4.0–10.5)
nRBC: 0 % (ref 0.0–0.2)

## 2020-08-04 LAB — BLOOD CULTURE ID PANEL (REFLEXED) - BCID2
A.calcoaceticus-baumannii: NOT DETECTED
Bacteroides fragilis: NOT DETECTED
CTX-M ESBL: NOT DETECTED
Candida albicans: NOT DETECTED
Candida auris: NOT DETECTED
Candida glabrata: NOT DETECTED
Candida krusei: NOT DETECTED
Candida parapsilosis: NOT DETECTED
Candida tropicalis: NOT DETECTED
Carbapenem resist OXA 48 LIKE: NOT DETECTED
Carbapenem resistance IMP: NOT DETECTED
Carbapenem resistance KPC: NOT DETECTED
Carbapenem resistance NDM: NOT DETECTED
Carbapenem resistance VIM: NOT DETECTED
Cryptococcus neoformans/gattii: NOT DETECTED
Enterobacter cloacae complex: NOT DETECTED
Enterococcus Faecium: NOT DETECTED
Enterococcus faecalis: NOT DETECTED
Escherichia coli: DETECTED — AB
Haemophilus influenzae: NOT DETECTED
Klebsiella aerogenes: NOT DETECTED
Klebsiella oxytoca: NOT DETECTED
Klebsiella pneumoniae: NOT DETECTED
Listeria monocytogenes: NOT DETECTED
Neisseria meningitidis: NOT DETECTED
Proteus species: NOT DETECTED
Pseudomonas aeruginosa: NOT DETECTED
Salmonella species: NOT DETECTED
Serratia marcescens: NOT DETECTED
Staphylococcus aureus (BCID): NOT DETECTED
Staphylococcus epidermidis: NOT DETECTED
Staphylococcus lugdunensis: NOT DETECTED
Staphylococcus species: NOT DETECTED
Stenotrophomonas maltophilia: NOT DETECTED
Streptococcus agalactiae: NOT DETECTED
Streptococcus pneumoniae: NOT DETECTED
Streptococcus pyogenes: NOT DETECTED
Streptococcus species: NOT DETECTED

## 2020-08-04 LAB — CBG MONITORING, ED: Glucose-Capillary: 123 mg/dL — ABNORMAL HIGH (ref 70–99)

## 2020-08-04 LAB — LACTIC ACID, PLASMA
Lactic Acid, Venous: 2 mmol/L (ref 0.5–1.9)
Lactic Acid, Venous: 2.2 mmol/L (ref 0.5–1.9)
Lactic Acid, Venous: 2.2 mmol/L (ref 0.5–1.9)

## 2020-08-04 LAB — PROTIME-INR
INR: 1.3 — ABNORMAL HIGH (ref 0.8–1.2)
Prothrombin Time: 15.9 seconds — ABNORMAL HIGH (ref 11.4–15.2)

## 2020-08-04 LAB — LIPASE, BLOOD: Lipase: 19 U/L (ref 11–51)

## 2020-08-04 LAB — SARS CORONAVIRUS 2 BY RT PCR (HOSPITAL ORDER, PERFORMED IN ~~LOC~~ HOSPITAL LAB): SARS Coronavirus 2: NEGATIVE

## 2020-08-04 MED ORDER — PIPERACILLIN-TAZOBACTAM IN DEX 2-0.25 GM/50ML IV SOLN
2.2500 g | Freq: Three times a day (TID) | INTRAVENOUS | Status: DC
  Administered 2020-08-04: 2.25 g via INTRAVENOUS
  Filled 2020-08-04 (×2): qty 50

## 2020-08-04 MED ORDER — SODIUM CHLORIDE 0.9 % IV BOLUS
500.0000 mL | Freq: Once | INTRAVENOUS | Status: AC
  Administered 2020-08-04: 500 mL via INTRAVENOUS

## 2020-08-04 MED ORDER — ACETAMINOPHEN 325 MG PO TABS
650.0000 mg | ORAL_TABLET | Freq: Once | ORAL | Status: AC
  Administered 2020-08-04: 650 mg via ORAL
  Filled 2020-08-04: qty 2

## 2020-08-04 MED ORDER — FENTANYL CITRATE (PF) 100 MCG/2ML IJ SOLN
50.0000 ug | Freq: Once | INTRAMUSCULAR | Status: AC
  Administered 2020-08-04: 50 ug via INTRAVENOUS
  Filled 2020-08-04: qty 2

## 2020-08-04 NOTE — ED Notes (Signed)
Report given to [facility], patient transferred to [facility] at this time.

## 2020-08-04 NOTE — Progress Notes (Signed)
Pharmacy Antibiotic Note  Gerald Jacobs is a 86 y.o. male admitted on 08/03/2020 with sepsis.  Pharmacy has been consulted for Zosyn dosing.  Plan: Zosyn 2.25gm IV q8h Will f/u renal function, micro data, and pt's clinical condition     Temp (24hrs), Avg:99.9 F (37.7 C), Min:99.1 F (37.3 C), Max:100.6 F (38.1 C)  Recent Labs  Lab 08/04/20 0106 08/04/20 0108  WBC  --  4.2  CREATININE  --  4.11*  LATICACIDVEN 2.0*  --     CrCl cannot be calculated (Unknown ideal weight.).    Allergies  Allergen Reactions  . Amoxicillin-Pot Clavulanate Diarrhea    Diarrhea  Patient reports contracted C.diff while taking this  . Colchicine Other (See Comments)    GI problems  . Flexeril [Cyclobenzaprine Hcl] Other (See Comments)    GI problems   . Levaquin [Levofloxacin In D5w] Other (See Comments)    Insomnia   . Percocet [Oxycodone-Acetaminophen] Nausea And Vomiting  . Zebeta Other (See Comments)    Gi problems  . Bisoprolol Fumarate Other (See Comments)    Gi problems    Antimicrobials this admission: 9/7 Zosyn >>   Microbiology results: 9/7 BCx:   Thank you for allowing pharmacy to be a part of this patient's care.  Sherlon Handing, PharmD, BCPS Please see amion for complete clinical pharmacist phone list 08/04/2020 3:56 AM

## 2020-08-05 ENCOUNTER — Encounter (HOSPITAL_COMMUNITY): Payer: Medicare Other

## 2020-08-06 LAB — CULTURE, BLOOD (ROUTINE X 2)
Special Requests: ADEQUATE
Special Requests: ADEQUATE

## 2020-08-07 ENCOUNTER — Telehealth: Payer: Self-pay

## 2020-08-07 NOTE — Telephone Encounter (Signed)
Pt with + BC report from [facility] ED 08/04/20 Pt is now at [facility]

## 2020-08-10 ENCOUNTER — Encounter: Payer: Self-pay | Admitting: Family

## 2020-08-11 ENCOUNTER — Encounter: Payer: Self-pay | Admitting: Family

## 2020-08-12 ENCOUNTER — Encounter (HOSPITAL_COMMUNITY): Payer: Medicare Other

## 2020-08-14 ENCOUNTER — Encounter: Payer: Self-pay | Admitting: Family

## 2020-08-28 DEATH — deceased

## 2020-09-16 ENCOUNTER — Encounter: Payer: Self-pay | Admitting: Family

## 2020-10-05 ENCOUNTER — Ambulatory Visit: Payer: Medicare Other | Admitting: Cardiovascular Disease
